# Patient Record
Sex: Female | Born: 1950 | Race: White | Hispanic: No | Marital: Single | Smoking: Former smoker
Health system: Southern US, Community
[De-identification: ages and names within clinical notes are randomized; demographics above are authoritative.]

## PROBLEM LIST (undated history)

## (undated) DIAGNOSIS — E785 Hyperlipidemia, unspecified: Secondary | ICD-10-CM

## (undated) DIAGNOSIS — Z5189 Encounter for other specified aftercare: Secondary | ICD-10-CM

## (undated) DIAGNOSIS — B372 Candidiasis of skin and nail: Secondary | ICD-10-CM

## (undated) DIAGNOSIS — IMO0002 Reserved for concepts with insufficient information to code with codable children: Secondary | ICD-10-CM

## (undated) DIAGNOSIS — F32A Depression, unspecified: Secondary | ICD-10-CM

## (undated) DIAGNOSIS — J189 Pneumonia, unspecified organism: Secondary | ICD-10-CM

## (undated) DIAGNOSIS — G4730 Sleep apnea, unspecified: Secondary | ICD-10-CM

## (undated) DIAGNOSIS — F329 Major depressive disorder, single episode, unspecified: Secondary | ICD-10-CM

## (undated) DIAGNOSIS — F319 Bipolar disorder, unspecified: Secondary | ICD-10-CM

## (undated) DIAGNOSIS — R0602 Shortness of breath: Secondary | ICD-10-CM

## (undated) DIAGNOSIS — H9190 Unspecified hearing loss, unspecified ear: Secondary | ICD-10-CM

## (undated) DIAGNOSIS — J40 Bronchitis, not specified as acute or chronic: Secondary | ICD-10-CM

## (undated) DIAGNOSIS — D649 Anemia, unspecified: Secondary | ICD-10-CM

## (undated) DIAGNOSIS — F419 Anxiety disorder, unspecified: Secondary | ICD-10-CM

## (undated) DIAGNOSIS — M1990 Unspecified osteoarthritis, unspecified site: Secondary | ICD-10-CM

## (undated) DIAGNOSIS — I10 Essential (primary) hypertension: Secondary | ICD-10-CM

## (undated) DIAGNOSIS — J449 Chronic obstructive pulmonary disease, unspecified: Secondary | ICD-10-CM

## (undated) DIAGNOSIS — K219 Gastro-esophageal reflux disease without esophagitis: Secondary | ICD-10-CM

## (undated) DIAGNOSIS — G629 Polyneuropathy, unspecified: Secondary | ICD-10-CM

## (undated) HISTORY — PX: OTHER SURGICAL HISTORY: SHX169

## (undated) HISTORY — PX: HIP FRACTURE SURGERY: SHX118

## (undated) HISTORY — PX: TONSILLECTOMY: SUR1361

---

## 2006-04-15 ENCOUNTER — Ambulatory Visit: Payer: Self-pay | Admitting: Internal Medicine

## 2006-04-28 ENCOUNTER — Ambulatory Visit: Payer: Self-pay | Admitting: Internal Medicine

## 2008-08-06 ENCOUNTER — Inpatient Hospital Stay (HOSPITAL_COMMUNITY): Admission: RE | Admit: 2008-08-06 | Discharge: 2008-08-13 | Payer: Self-pay | Admitting: Orthopedic Surgery

## 2008-08-06 ENCOUNTER — Encounter (INDEPENDENT_AMBULATORY_CARE_PROVIDER_SITE_OTHER): Payer: Self-pay | Admitting: Orthopedic Surgery

## 2008-10-04 ENCOUNTER — Inpatient Hospital Stay (HOSPITAL_COMMUNITY): Admission: EM | Admit: 2008-10-04 | Discharge: 2008-10-09 | Payer: Self-pay | Admitting: Emergency Medicine

## 2009-06-05 ENCOUNTER — Encounter: Admission: RE | Admit: 2009-06-05 | Discharge: 2009-06-05 | Payer: Self-pay | Admitting: Family Medicine

## 2009-07-10 ENCOUNTER — Inpatient Hospital Stay (HOSPITAL_COMMUNITY): Admission: EM | Admit: 2009-07-10 | Discharge: 2009-07-16 | Payer: Self-pay | Admitting: Emergency Medicine

## 2010-04-29 ENCOUNTER — Encounter
Admission: RE | Admit: 2010-04-29 | Discharge: 2010-05-14 | Payer: Self-pay | Source: Home / Self Care | Attending: Orthopedic Surgery | Admitting: Orthopedic Surgery

## 2010-05-19 ENCOUNTER — Encounter
Admission: RE | Admit: 2010-05-19 | Discharge: 2010-06-16 | Payer: Self-pay | Source: Home / Self Care | Attending: Orthopedic Surgery | Admitting: Orthopedic Surgery

## 2010-05-26 ENCOUNTER — Encounter: Admission: RE | Admit: 2010-05-26 | Payer: Self-pay | Source: Home / Self Care | Admitting: Orthopedic Surgery

## 2010-06-17 ENCOUNTER — Other Ambulatory Visit: Payer: Self-pay | Admitting: Family Medicine

## 2010-06-17 DIAGNOSIS — Z1239 Encounter for other screening for malignant neoplasm of breast: Secondary | ICD-10-CM

## 2010-06-23 ENCOUNTER — Ambulatory Visit: Payer: Self-pay

## 2010-06-30 ENCOUNTER — Ambulatory Visit
Admission: RE | Admit: 2010-06-30 | Discharge: 2010-06-30 | Disposition: A | Discharge status: Home / Self Care | Payer: Medicaid Other | Source: Ambulatory Visit | Attending: Family Medicine | Admitting: Family Medicine

## 2010-06-30 DIAGNOSIS — Z1239 Encounter for other screening for malignant neoplasm of breast: Secondary | ICD-10-CM

## 2010-08-05 LAB — BASIC METABOLIC PANEL
BUN: 18 mg/dL (ref 6–23)
BUN: 23 mg/dL (ref 6–23)
CO2: 28 mEq/L (ref 19–32)
CO2: 30 mEq/L (ref 19–32)
CO2: 31 mEq/L (ref 19–32)
Calcium: 7.6 mg/dL — ABNORMAL LOW (ref 8.4–10.5)
Calcium: 8.2 mg/dL — ABNORMAL LOW (ref 8.4–10.5)
Calcium: 8.4 mg/dL (ref 8.4–10.5)
Calcium: 8.5 mg/dL (ref 8.4–10.5)
Calcium: 9 mg/dL (ref 8.4–10.5)
Chloride: 103 mEq/L (ref 96–112)
Chloride: 104 mEq/L (ref 96–112)
Chloride: 106 mEq/L (ref 96–112)
Creatinine, Ser: 1.15 mg/dL (ref 0.4–1.2)
Creatinine, Ser: 1.19 mg/dL (ref 0.4–1.2)
Creatinine, Ser: 1.3 mg/dL — ABNORMAL HIGH (ref 0.4–1.2)
GFR calc Af Amer: 51 mL/min — ABNORMAL LOW (ref >60)
GFR calc Af Amer: 58 mL/min — ABNORMAL LOW (ref >60)
GFR calc Af Amer: 59 mL/min — ABNORMAL LOW (ref >60)
GFR calc non Af Amer: 43 mL/min — ABNORMAL LOW (ref >60)
GFR calc non Af Amer: 49 mL/min — ABNORMAL LOW (ref >60)
Glucose, Bld: 128 mg/dL — ABNORMAL HIGH (ref 70–99)
Glucose, Bld: 134 mg/dL — ABNORMAL HIGH (ref 70–99)
Potassium: 4.8 mEq/L (ref 3.5–5.1)
Potassium: 5.1 mEq/L (ref 3.5–5.1)
Sodium: 135 mEq/L (ref 135–145)
Sodium: 137 mEq/L (ref 135–145)

## 2010-08-05 LAB — CBC
Hemoglobin: 10.9 g/dL — ABNORMAL LOW (ref 12.0–15.0)
Hemoglobin: 11.9 g/dL — ABNORMAL LOW (ref 12.0–15.0)
Hemoglobin: 9.4 g/dL — ABNORMAL LOW (ref 12.0–15.0)
MCHC: 33.3 g/dL (ref 30.0–36.0)
MCHC: 33.4 g/dL (ref 30.0–36.0)
MCHC: 33.4 g/dL (ref 30.0–36.0)
MCHC: 33.9 g/dL (ref 30.0–36.0)
MCV: 85.4 fL (ref 78.0–100.0)
MCV: 85.7 fL (ref 78.0–100.0)
Platelets: 178 10*3/uL (ref 150–400)
RBC: 3.2 MIL/uL — ABNORMAL LOW (ref 3.87–5.11)
RBC: 3.65 MIL/uL — ABNORMAL LOW (ref 3.87–5.11)
RBC: 4.23 MIL/uL (ref 3.87–5.11)
RDW: 15.6 % — ABNORMAL HIGH (ref 11.5–15.5)
RDW: 15.9 % — ABNORMAL HIGH (ref 11.5–15.5)
RDW: 16.1 % — ABNORMAL HIGH (ref 11.5–15.5)
WBC: 10.7 10*3/uL — ABNORMAL HIGH (ref 4.0–10.5)
WBC: 8.8 10*3/uL (ref 4.0–10.5)

## 2010-08-05 LAB — CROSSMATCH
ABO/RH(D): A NEG
Antibody Screen: NEGATIVE

## 2010-08-05 LAB — PROTIME-INR
INR: 0.99 (ref 0.00–1.49)
Prothrombin Time: 13 seconds (ref 11.6–15.2)

## 2010-08-05 LAB — GLUCOSE, CAPILLARY
Glucose-Capillary: 102 mg/dL — ABNORMAL HIGH (ref 70–99)
Glucose-Capillary: 106 mg/dL — ABNORMAL HIGH (ref 70–99)
Glucose-Capillary: 123 mg/dL — ABNORMAL HIGH (ref 70–99)
Glucose-Capillary: 125 mg/dL — ABNORMAL HIGH (ref 70–99)
Glucose-Capillary: 133 mg/dL — ABNORMAL HIGH (ref 70–99)
Glucose-Capillary: 143 mg/dL — ABNORMAL HIGH (ref 70–99)
Glucose-Capillary: 93 mg/dL (ref 70–99)
Glucose-Capillary: 99 mg/dL (ref 70–99)

## 2010-08-05 LAB — HEMOGLOBIN A1C: Mean Plasma Glucose: 137 mg/dL

## 2010-08-05 LAB — URINALYSIS, ROUTINE W REFLEX MICROSCOPIC
Bilirubin Urine: NEGATIVE
Glucose, UA: NEGATIVE mg/dL
Hgb urine dipstick: NEGATIVE
Ketones, ur: NEGATIVE mg/dL
Nitrite: NEGATIVE
Protein, ur: NEGATIVE mg/dL

## 2010-08-05 LAB — HEMOGLOBIN AND HEMATOCRIT, BLOOD: Hemoglobin: 9.9 g/dL — ABNORMAL LOW (ref 12.0–15.0)

## 2010-08-05 LAB — DIFFERENTIAL
Basophils Absolute: 0 10*3/uL (ref 0.0–0.1)
Monocytes Absolute: 0.8 10*3/uL (ref 0.1–1.0)
Monocytes Relative: 8 % (ref 3–12)
Neutro Abs: 8.2 10*3/uL — ABNORMAL HIGH (ref 1.7–7.7)

## 2010-08-10 LAB — BASIC METABOLIC PANEL
Calcium: 8.7 mg/dL (ref 8.4–10.5)
Chloride: 99 mEq/L (ref 96–112)
Creatinine, Ser: 1.23 mg/dL — ABNORMAL HIGH (ref 0.4–1.2)
GFR calc Af Amer: 54 mL/min — ABNORMAL LOW (ref >60)
GFR calc non Af Amer: 45 mL/min — ABNORMAL LOW (ref >60)

## 2010-08-10 LAB — GLUCOSE, CAPILLARY
Glucose-Capillary: 107 mg/dL — ABNORMAL HIGH (ref 70–99)
Glucose-Capillary: 111 mg/dL — ABNORMAL HIGH (ref 70–99)
Glucose-Capillary: 123 mg/dL — ABNORMAL HIGH (ref 70–99)

## 2010-08-25 LAB — HEMOGLOBIN AND HEMATOCRIT, BLOOD
HCT: 23.3 % — ABNORMAL LOW (ref 36.0–46.0)
HCT: 24.9 % — ABNORMAL LOW (ref 36.0–46.0)
Hemoglobin: 7.8 g/dL — CL (ref 12.0–15.0)
Hemoglobin: 8.1 g/dL — ABNORMAL LOW (ref 12.0–15.0)

## 2010-08-25 LAB — ANAEROBIC CULTURE

## 2010-08-25 LAB — BASIC METABOLIC PANEL
CO2: 27 mEq/L (ref 19–32)
Calcium: 9.3 mg/dL (ref 8.4–10.5)
Creatinine, Ser: 1.11 mg/dL (ref 0.4–1.2)
GFR calc Af Amer: >60 mL/min (ref >60)
GFR calc non Af Amer: 50 mL/min — ABNORMAL LOW (ref >60)
Sodium: 138 mEq/L (ref 135–145)

## 2010-08-25 LAB — GLUCOSE, CAPILLARY
Glucose-Capillary: 103 mg/dL — ABNORMAL HIGH (ref 70–99)
Glucose-Capillary: 106 mg/dL — ABNORMAL HIGH (ref 70–99)
Glucose-Capillary: 121 mg/dL — ABNORMAL HIGH (ref 70–99)

## 2010-08-25 LAB — TYPE AND SCREEN: ABO/RH(D): A NEG

## 2010-08-25 LAB — COMPREHENSIVE METABOLIC PANEL
ALT: 17 U/L (ref 0–35)
AST: 18 U/L (ref 0–37)
Alkaline Phosphatase: 70 U/L (ref 39–117)
CO2: 27 mEq/L (ref 19–32)
Chloride: 103 mEq/L (ref 96–112)
GFR calc Af Amer: 59 mL/min — ABNORMAL LOW (ref >60)
GFR calc non Af Amer: 49 mL/min — ABNORMAL LOW (ref >60)
Glucose, Bld: 130 mg/dL — ABNORMAL HIGH (ref 70–99)
Sodium: 137 mEq/L (ref 135–145)
Total Bilirubin: 0.9 mg/dL (ref 0.3–1.2)

## 2010-08-25 LAB — CBC
Hemoglobin: 11.7 g/dL — ABNORMAL LOW (ref 12.0–15.0)
RBC: 4.28 MIL/uL (ref 3.87–5.11)
WBC: 13.1 10*3/uL — ABNORMAL HIGH (ref 4.0–10.5)

## 2010-08-25 LAB — URINALYSIS, ROUTINE W REFLEX MICROSCOPIC
Glucose, UA: NEGATIVE mg/dL
Hgb urine dipstick: NEGATIVE
Protein, ur: NEGATIVE mg/dL
Urobilinogen, UA: 0.2 mg/dL (ref 0.0–1.0)

## 2010-08-25 LAB — WOUND CULTURE: Gram Stain: NONE SEEN

## 2010-08-25 LAB — PROTIME-INR
INR: 1 (ref 0.00–1.49)
INR: 1.2 (ref 0.00–1.49)
INR: 2.3 — ABNORMAL HIGH (ref 0.00–1.49)
INR: 2.3 — ABNORMAL HIGH (ref 0.00–1.49)
Prothrombin Time: 15.1 seconds (ref 11.6–15.2)
Prothrombin Time: 18.1 seconds — ABNORMAL HIGH (ref 11.6–15.2)

## 2010-08-25 LAB — URINE MICROSCOPIC-ADD ON

## 2010-08-25 LAB — DIFFERENTIAL
Lymphocytes Relative: 10 % — ABNORMAL LOW (ref 12–46)
Monocytes Absolute: 0.6 10*3/uL (ref 0.1–1.0)
Monocytes Relative: 5 % (ref 3–12)
Neutro Abs: 11 10*3/uL — ABNORMAL HIGH (ref 1.7–7.7)
Neutrophils Relative %: 84 % — ABNORMAL HIGH (ref 43–77)

## 2010-08-25 LAB — APTT: aPTT: 29 seconds (ref 24–37)

## 2010-08-27 LAB — BASIC METABOLIC PANEL
BUN: 10 mg/dL (ref 6–23)
BUN: 14 mg/dL (ref 6–23)
BUN: 8 mg/dL (ref 6–23)
BUN: 9 mg/dL (ref 6–23)
CO2: 29 mEq/L (ref 19–32)
CO2: 29 mEq/L (ref 19–32)
CO2: 29 mEq/L (ref 19–32)
CO2: 29 mEq/L (ref 19–32)
Calcium: 8.6 mg/dL (ref 8.4–10.5)
Calcium: 8.8 mg/dL (ref 8.4–10.5)
Calcium: 9.5 mg/dL (ref 8.4–10.5)
Chloride: 101 mEq/L (ref 96–112)
Chloride: 102 mEq/L (ref 96–112)
Chloride: 107 mEq/L (ref 96–112)
Creatinine, Ser: 0.9 mg/dL (ref 0.4–1.2)
Creatinine, Ser: 0.96 mg/dL (ref 0.4–1.2)
Creatinine, Ser: 0.98 mg/dL (ref 0.4–1.2)
Creatinine, Ser: 1.03 mg/dL (ref 0.4–1.2)
Creatinine, Ser: 1.05 mg/dL (ref 0.4–1.2)
GFR calc Af Amer: >60 mL/min (ref >60)
GFR calc Af Amer: >60 mL/min (ref >60)
GFR calc non Af Amer: 58 mL/min — ABNORMAL LOW (ref >60)
GFR calc non Af Amer: >60 mL/min (ref >60)
GFR calc non Af Amer: >60 mL/min (ref >60)
Glucose, Bld: 124 mg/dL — ABNORMAL HIGH (ref 70–99)
Glucose, Bld: 127 mg/dL — ABNORMAL HIGH (ref 70–99)
Potassium: 4 mEq/L (ref 3.5–5.1)
Potassium: 4.3 mEq/L (ref 3.5–5.1)
Potassium: 4.3 mEq/L (ref 3.5–5.1)
Potassium: 4.4 mEq/L (ref 3.5–5.1)
Sodium: 132 mEq/L — ABNORMAL LOW (ref 135–145)
Sodium: 134 mEq/L — ABNORMAL LOW (ref 135–145)

## 2010-08-27 LAB — TYPE AND SCREEN
ABO/RH(D): A NEG
Antibody Screen: NEGATIVE

## 2010-08-27 LAB — GLUCOSE, CAPILLARY
Glucose-Capillary: 100 mg/dL — ABNORMAL HIGH (ref 70–99)
Glucose-Capillary: 108 mg/dL — ABNORMAL HIGH (ref 70–99)
Glucose-Capillary: 108 mg/dL — ABNORMAL HIGH (ref 70–99)
Glucose-Capillary: 110 mg/dL — ABNORMAL HIGH (ref 70–99)
Glucose-Capillary: 113 mg/dL — ABNORMAL HIGH (ref 70–99)
Glucose-Capillary: 116 mg/dL — ABNORMAL HIGH (ref 70–99)
Glucose-Capillary: 119 mg/dL — ABNORMAL HIGH (ref 70–99)
Glucose-Capillary: 120 mg/dL — ABNORMAL HIGH (ref 70–99)
Glucose-Capillary: 120 mg/dL — ABNORMAL HIGH (ref 70–99)
Glucose-Capillary: 121 mg/dL — ABNORMAL HIGH (ref 70–99)
Glucose-Capillary: 121 mg/dL — ABNORMAL HIGH (ref 70–99)
Glucose-Capillary: 121 mg/dL — ABNORMAL HIGH (ref 70–99)
Glucose-Capillary: 122 mg/dL — ABNORMAL HIGH (ref 70–99)
Glucose-Capillary: 92 mg/dL (ref 70–99)
Glucose-Capillary: 97 mg/dL (ref 70–99)
Glucose-Capillary: 97 mg/dL (ref 70–99)

## 2010-08-27 LAB — COMPREHENSIVE METABOLIC PANEL
AST: 30 U/L (ref 0–37)
Albumin: 3.7 g/dL (ref 3.5–5.2)
BUN: 17 mg/dL (ref 6–23)
Calcium: 9.8 mg/dL (ref 8.4–10.5)
Creatinine, Ser: 1.19 mg/dL (ref 0.4–1.2)
GFR calc Af Amer: 56 mL/min — ABNORMAL LOW (ref >60)
Total Protein: 7 g/dL (ref 6.0–8.3)

## 2010-08-27 LAB — URINALYSIS, ROUTINE W REFLEX MICROSCOPIC
Bilirubin Urine: NEGATIVE
Ketones, ur: NEGATIVE mg/dL
Nitrite: NEGATIVE
Specific Gravity, Urine: 1.009 (ref 1.005–1.030)
Urobilinogen, UA: 0.2 mg/dL (ref 0.0–1.0)
pH: 6 (ref 5.0–8.0)

## 2010-08-27 LAB — PROTIME-INR
INR: 1.4 (ref 0.00–1.49)
INR: 2.4 — ABNORMAL HIGH (ref 0.00–1.49)
INR: 2.8 — ABNORMAL HIGH (ref 0.00–1.49)
Prothrombin Time: 18.1 seconds — ABNORMAL HIGH (ref 11.6–15.2)
Prothrombin Time: 27.5 seconds — ABNORMAL HIGH (ref 11.6–15.2)
Prothrombin Time: 27.8 seconds — ABNORMAL HIGH (ref 11.6–15.2)

## 2010-08-27 LAB — CBC
HCT: 38.5 % (ref 36.0–46.0)
MCV: 86.7 fL (ref 78.0–100.0)
Platelets: 296 10*3/uL (ref 150–400)
RDW: 13.5 % (ref 11.5–15.5)

## 2010-08-27 LAB — DIFFERENTIAL
Basophils Absolute: 0 10*3/uL (ref 0.0–0.1)
Lymphocytes Relative: 20 % (ref 12–46)
Lymphs Abs: 1.7 10*3/uL (ref 0.7–4.0)
Monocytes Absolute: 0.6 10*3/uL (ref 0.1–1.0)
Monocytes Relative: 7 % (ref 3–12)
Neutro Abs: 6.1 10*3/uL (ref 1.7–7.7)

## 2010-08-27 LAB — APTT: aPTT: 31 seconds (ref 24–37)

## 2010-08-27 LAB — HEMOGLOBIN AND HEMATOCRIT, BLOOD
HCT: 27.9 % — ABNORMAL LOW (ref 36.0–46.0)
Hemoglobin: 10.5 g/dL — ABNORMAL LOW (ref 12.0–15.0)
Hemoglobin: 9.2 g/dL — ABNORMAL LOW (ref 12.0–15.0)

## 2010-09-29 NOTE — Op Note (Signed)
NAME:  [name] [name], [name] [name]                 ACCOUNT NO.:  [id number]   MEDICAL RECORD NO.:  [id number]          PATIENT TYPE:  INP   LOCATION:  1606                         FACILITY:  [facility]   PHYSICIAN:  Marlowe Kays, M.D.  DATE OF BIRTH:  04/25/1951   DATE OF PROCEDURE:  10/05/2008  DATE OF DISCHARGE:                               OPERATIVE REPORT   PREOPERATIVE DIAGNOSES:  Closed displaced right femur fracture through  last screw hole of three hole hip compression screw.   POSTOPERATIVE DIAGNOSES:  Closed displaced right femur fracture through  last screw hole of three hole hip compression screw.   OPERATION:  1. Removal of prior three hole sideplate from hip compression screw      with retention of the lag screw.  2. Open reduction of the femur fracture.  3. Internal fixation of the fracture with a new 8-hole sideplate.  4. Application of cancellus allograft.  5. Application of a cylinder cast.   SURGEON:  Dr. Simonne Come.   ASSISTANT:  Dr. Worthy Rancher.   ANESTHESIA:  General.   PATHOLOGY AND JUSTIFICATION FOR PROCEDURE:  She had, had a prior hip  compression screw with three hole plate on August 06, 2008, by Dr.  Darrelyn Hillock.  Yesterday evening, she was simply walking on a step she said  when she felt a pop and her leg gave way with pain.  She was seen in the  [facility] emergency room with the above-mentioned fracture noted.  She  is admitted at this time for operative correction.   PROCEDURE:  Prophylactic antibiotics, satisfactory general anesthesia,  placed on the [facility] fracture table and with traction and rotation, we  obtained the best reduction of the fracture that we could before  opening.  The fracture had occurred at roughly the level of the third  screw hole with the screw out of the bone, the other two screws  remaining in the bone with the sideplate.  I then prepped the right leg  with DuraPrep down below the knee and draped in a sterile field with  shower curtain  employed.  Timeout performed.  I made a long lateral  incision going through the prior incision and then curving slightly  anteriorly and significantly distally.  I went through the old surgical  incision and found a pocket of fluid which appeared to be postoperative  collection of fluid, but we cultured it anyway for aerobic and  anaerobic.  The fracture site was identified and we then exposed enough  femur distally to allow five more additional holes on the plate or 10  cortices.  I then was able to remove the three screws and the sideplate  leaving the lag screw in place since it was in good position.  I  debrided the fracture site freshening it up with curette and a small  rongeur and using a Jackson clamp and using the C-arm with traction and  rotation, we were able to anatomically reduce the fracture.  Once this  had occurred, we realized what we had to do to maintain it and we then  removed  the Jackson clamp and I was able to thread the 8-hole 135 degree  angle sideplate over the previous lag screw.  Previous compression plate  had been 145 degrees, but there was a little gap between the sideplate  in the femur on the intraoperative pictures and we felt that to decrease  the angle might give more stability on the femur.  We then went through  a somewhat difficult time trying to keep the fracture reduced and also  anatomically placed the plate on the distal femur.  The whole [place] was  complicated by her significant obesity.  We finally were able to achieve  both with reduction of the fracture and correct placement of the distal  plate using two [place] claw clamps.  We then went about individually  drilling, measuring and screwing the eight holes, except for the one at  the fracture site.  Throughout the [place], progress was monitored with the  C-arm.  At the conclusion of the internal fixation and stabilization, we  took AP and lateral pictures and then irrigated the wound well.   There  was a slight gap in the fracture fragments and we packed this with  cancellous allograft.  We then closed the wound with a running #1 Vicryl  in the vastus lateralis, interrupted #1 Vicryl in the fascia lata and  remnants thereof from the prior surgery.  The subcutaneous tissue was  closed in layers with #1-0 and 0 Vicryl and staples in the skin.  Betadine adaptic dry sterile dressings were applied.  The knee  immobilizer was then applied.  FloSeal was also used of prior to  closure.  Estimated blood loss was mainly at the first part of the [place],  about 700 mL with no blood replacement.  There were no known operative  complications.  She was taken to the PACU in satisfactory condition.           ______________________________  Marlowe Kays, M.D.     JA/MEDQ  D:  10/05/2008  T:  10/05/2008  Job:  [id number]

## 2010-09-29 NOTE — Discharge Summary (Signed)
NAME:  [name] [name], [name] [name]                 ACCOUNT NO.:  [id number]   MEDICAL RECORD NO.:  [id number]          PATIENT TYPE:  INP   LOCATION:  1606                         FACILITY:  [facility]   PHYSICIAN:  Marlowe Kays, M.D.  DATE OF BIRTH:  01/04/1951   DATE OF ADMISSION:  10/04/2008  DATE OF DISCHARGE:  10/09/2008                               DISCHARGE SUMMARY   ADMITTING DIAGNOSES:  1. Periprosthetic fracture of the right femur (compression screw and      side plate).  2. Hypertension.  3. Chronic obstructive pulmonary disease.  4. Diabetes, type 2.  5. Obesity.   DISCHARGE DIAGNOSES:  1. Periprosthetic fracture of the right femur (compression screw and      side plate).  2. Hypertension.  3. Chronic obstructive pulmonary disease.  4. Diabetes, type 2.  5. Obesity.  6. Postop acute anemia.   OPERATION:  1. On Oct 05, 2008, the patient underwent removal of prior 3-hole side      plate from hip compression screw with retention of lag screw.  2. Open reduction of femoral fracture.  3. Internal fixation of the fracture with new 8-hole side plate.  4. Application of cancellous allograft.   Dr. Ranee Gosselin assisted.   BRIEF HISTORY:  This 60 year old female who underwent open reduction and  internal fixation of the suspected pathological fracture of the right  femur by Dr. Darrelyn Hillock on August 06, 2008, was living at her sister's house  after being in an inpatient rehabilitation and ascending some stairs.  When she stepped on the first step, she felt/heard a pop in her right  femur, immediate pain, and fell backwards.  She struck her head when she  fell backwards.  No lacerations and the x-rays of the skull were normal.  X-rays did show a periprosthetic fracture of the right femur at the  distal area of the side plate of the compression screw fixation.  The  patient was scheduled for open reduction internal fixation for the above  procedure.  The operating room became available the  next day and she  underwent that procedure.   COURSE IN THE HOSPITAL:  She tolerated her surgical procedure quite  well.  She did have a drop in her hemoglobin postoperatively which was  expected somewhat.  It dropped down to 7.8.  We chose not to transfuse  he due to her relatively good health and the hemoglobin came up to 8.1  at the time of this dictation.  The wound had a large amount of serous  drainage.  Dressings were changed in a timely manner.   The patient was placed on Coumadin protocol followed by pharmacy to keep  the INR between 2 and 3.  She did well with that.  Physical therapy  worked with the patient, primarily transfers.  We allowed only touchdown  weightbearing to the right lower extremity using a knee immobilizer to  prevent rotation of the thigh and the fracture site.  When in bed, knee  immobilizer was not necessary.  She primarily used a bedside commode,  was voiding with no difficulty.  Intravenous antibiotics, as well as a  PCA were eventually discontinued and she was maintained on p.o.  analgesics.   She will need to have skilled nursing for her continuing rehabilitation  until she becomes more independent and hopefully be able to return to  her sister's home for the rest of her healing course.   LABORATORY VALUES IN THE HOSPITAL:  Hematologically showed a  preoperative hemoglobin of 11.7, hematocrit was 35.7.  Final hemoglobin  was 8.1 with hematocrit of 23.9.  Blood chemistries remained normal.  Wound culture showed no organism seen at the fracture site.  This was no  growth x2 days.  Anaerobic showed no anaerobes in culture and progress  for 5 days after specimen was given on Oct 05, 2008.  When it was read  on Oct 06, 2008, again no growth was seen.  Chest x-ray read of the  cervical spine series and CT imaging showed no fracture but there was  small subluxation of C3 and C4 due to degenerative facet changes.  Portable chest showed probable COPD and  emphysema but no acute findings.  Urinalysis was negative for protein and nitrites.  Vital signs were  stable at discharge.   CONDITION ON DISCHARGE:  Improved, stable.   PLAN:  The patient is to continue with her rehabilitation of the right  lower extremity with activities of daily living, ambulation, maintaining  touchdown weightbearing to the right lower extremity only, no rotation  of the thigh/femur, and will use knee immobilizer when up and about.  Knee immobilizer need not be used while in bed.  Dressing changes on an  as-needed basis.  As long as serous drainage is present, it should be  done daily.   She should stay on the Coumadin protocol to maintain her INR between 2  and 3 for 4 weeks after date of surgery.  Staples may be removed at 2 to  2-1/2 weeks after surgery and Steri-Strips applied as indicated.  If  drainage continues, then she needs to keep the staples in until seen by  [place] in our office.  We would like to see her back in our office at  [facility], [phone], 2 to 2-1/2 weeks after date of  surgery.  At that time, we will x-ray her hip and femur.   MEDICATIONS AT DISCHARGE.:  1. Metformin 500 mg daily.  2. Lisinopril 10 mg daily.  3. Bupropion 200 mg b.i.d.  4. Omeprazole 20 mg daily.  5. Meloxicam 15 mg will not be used until after the Coumadin protocol      is finished.  6. Multivitamins daily.  7. Ocuvite b.i.d.  8. Gas-X gel caps p.r.n. none.  9. We will use Percocet for discomfort.  She may have that or Vicodin      for her needs.  10.Robaxin 500 mg one p.o. every 6 hours is used for muscle spasms.   If you have any medical questions, you would need to contact the medical  attending for the nursing facility to which she is being discharged and  any orthopedic problems or questions call us at [phone].      Dooley L. Cherlynn June.    ______________________________  Marlowe Kays, M.D.    DLU/MEDQ  D:  10/09/2008  T:   10/09/2008  Job:  [id number]   cc:   Marlowe Kays, M.D.  Fax: [phone]

## 2010-09-29 NOTE — Op Note (Signed)
NAME:  [name], [name]                 ACCOUNT NO.:  [id number]   MEDICAL RECORD NO.:  [id number]          PATIENT TYPE:  INP   LOCATION:  0009                         FACILITY:  [facility]   PHYSICIAN:  Georges Lynch. Gioffre, M.D.DATE OF BIRTH:  March 07, 1951   DATE OF PROCEDURE:  08/06/2008  DATE OF DISCHARGE:                               OPERATIVE REPORT   SURGEON:  Dr.  Darrelyn Hillock.   ASSISTANT:  Nurse and Zara Chess, PA student.   PREOPERATIVE DIAGNOSIS:  Questionable pathologic intertrochanteric  fracture right hip.   POSTOPERATIVE DIAGNOSIS:  Questionable pathologic intertrochanteric  fracture right hip.   OPERATION:  Open reduction and internal fixation of an intertrochanteric  fracture of the right hip utilizing the TK2 hip compression screw plate  device.  I used a 3-hole plate; the angle was a 145-degree-angled plate  with 16-XW-RUEAVW compression hip screw.   PROCEDURE IN DETAIL:  Under general anesthesia with the patient on the  fracture table a routine orthopedic prep and draping of the right hip  was carried out.  She had 2 g of IV Ancef preop.  At this time the C-arm  was brought in and an incision was made over the lateral aspect of the  right hip.  Bleeders were identified and cauterized.  Note, she was  quite obese and this took a great deal of time to expose the underlying  muscle and fascia.  Great care was taken.  We took our time going down  through and cauterized all the bleeders.  We finally went down and  incised the iliotibial band and then incised the vastus lateralis and  cauterized the bleeders.  Great care was taken not to injure the  underlying sciatic nerve which was posterior.  We then inserted a  Bennett retractor and identified the left greater trochanter and brought  the C-arm in and made a drill hole measuring 9/64/inch in the lateral  femoral cortex.  Then I utilized the angle guide to appropriate angle  and I inserted a guidepin up through the femoral  neck into the femoral  head.  Both on AP and lateral we had excellent position.  I then  measured the screw to be approximately 85-90 mm in length.  I then  drilled the lateral femoral cortex up into the femoral neck and head.  I  then tapped the femoral neck and head with a tap and then inserted my  plate compression screw.  Note, we had a slight elevation of the distal  part of the plate from the femoral shaft and basically this was a stress-  type fracture.  I had three good purchase screws after the drill holes  were made into the lateral femoral cortex and we measured it to length  and inserted 3 screws for excellent fixation of the bone was extremely  hard in that area, so we had good fixation of the plate device.  We  thoroughly irrigated out the area and we noted that under C-arm we had  excellent position of the compression screw into the femoral head.  I  then inserted  10 mL of FloSeal followed by some thrombin-soaked Gelfoam  and closed the wound layers in usual fashion.  Sterile Neosporin  dressing was applied.  The patient left the operative room in  satisfactory condition.           ______________________________  Georges Lynch Darrelyn Hillock, M.D.     RAG/MEDQ  D:  08/06/2008  T:  08/06/2008  Job:  [id number]

## 2010-09-29 NOTE — Consult Note (Signed)
NAME:  [name] [name], [name] [name]                 ACCOUNT NO.:  [id number]   MEDICAL RECORD NO.:  [id number]          PATIENT TYPE:  INP   LOCATION:  0009                         FACILITY:  [facility]   PHYSICIAN:  Georges Lynch. Gioffre, M.D.DATE OF BIRTH:  December 09, 1950   DATE OF CONSULTATION:  DATE OF DISCHARGE:                                 CONSULTATION   She was seen by me in the office with a history that 3 weeks ago she  fell in the shower and injured her right hip.  She had plain x-rays that  showed no fracture.  I still was very suspicious that she may have had a  fracture of the hip so I sent her over for an MRI.  The MRI revealed a  stress type fracture through the femoral neck and through the  intertrochanteric area on the right.  For that reason I admitted her to  the hospital for a hip nailing.   PAST SURGICAL HISTORY:  Tonsillectomy.   MEDICAL HISTORY:  History of reflux, diabetes, hypertension.   FAMILY HISTORY:  Unremarkable.   MEDICATIONS:  Metformin, lisinopril, meloxicam, bupropion hydrochloride,  omeprazole, aspirin, vitamins, Tramadol.  Note:  I do not have the  doses.  She did not bring those with her to the office.   ALLERGIES:  PREDNISONE CAUSES CHEST TIGHTNESS.   PHYSICAL EXAM:  VITALS:  Her pulse was 80, blood pressure 140/80,  respirations were normal.  She was alert and oriented.  The exam of the  head and neck was negative.  She had no lesions in the mouth.  The neck  was normal.  LUNGS:  Clear.  HEART:  Normal sinus rhythm with no murmur.  UPPER EXTREMITIES:  Were normal.  ABDOMEN:  She was severely obese.  BREAST EXAM:  Was deferred.  RECTAL:  Exam deferred.  LEFT HIP:  Normal.  RIGHT HIP:  She had painful motion of the right hip.  Her calves are  soft, nontender, no phlebitis.  The circulation was intact.  NEUROLOGICALLY:  She was intact.   Plain x-rays of the right hip were negative.  An MRI of the right hip  revealed a stress fracture through the right hip.   IMPRESSION:  Femoral neck and intertrochanteric fracture right hip.   PLAN:  I admitted her to the hospital and scheduled her for surgery for  open reduction internal fixation of the intertrochanteric fracture right  hip.           ______________________________  Georges Lynch. Darrelyn Hillock, M.D.     RAG/MEDQ  D:  08/06/2008  T:  08/06/2008  Job:  [id number]   cc:   Harrel Lemon. Merla Riches, M.D.  Fax: [phone]

## 2010-09-29 NOTE — H&P (Signed)
NAME:  [name], [name]                 ACCOUNT NO.:  [id number]   MEDICAL RECORD NO.:  [id number]          PATIENT TYPE:  INP   LOCATION:  0114                         FACILITY:  [facility]   PHYSICIAN:  Marlowe Kays, M.D.  DATE OF BIRTH:  1951-04-30   DATE OF ADMISSION:  10/04/2008  DATE OF DISCHARGE:                              HISTORY & PHYSICAL   CHIEF COMPLAINT:  Pain in my right thigh.   PRESENT ILLNESS:  A 60 year old white female who is recovering from open  reduction and internal fixation of suspected pathologic fracture of the  right femur which was done by Dr. Darrelyn Hillock on 08/06/2008.  She was living  his sister's house after coming out of rehab for continuing home rehab.  She was ascending some stairs and when stepping on the first step, she  felt/heard a pop into her right femur.  She had immediate pain in that  area and fell backwards onto some concrete.  She struck her head when  she fell backwards, but did not suffer any lacerations.  She was brought  to the emergency room where x-rays showed a periprosthetic fracture of  the right femur in the distal area of the side plate of compression  screw fixation which was performed as mentioned above.  She, after much  discussion, as well as after Dr. Simonne Come explaining the nature of the  fracture, it was decided that she would need to undergo open reduction  and internal fixation with removal of the side plate and application of  a longer side plate over the fracture site and perhaps bone graft.   PAST MEDICAL HISTORY:  For all details, please see the admission chart  of March of 2010.  This patient has hypertension, COPD, and diabetes  type 2.   CURRENT MEDICATIONS:  Metformin 5 mg daily, lisinopril 10 mg daily,  bupropion 100 mg b.i.d., omeprazole 20 mg daily, meloxicam 15 mg daily,  multiple vitamins, Tylenol, and aspirin 81 mg daily.   PAST SURGERIES:  Primarily the open reduction and external fixation.  She also had a  tonsillectomy in the past.   REVIEW OF SYSTEMS:  CNS: No seizures, paralysis, numbness, double  vision.  RESPIRATORY:  No productive cough, no hemoptysis or shortness of breath  other than with extreme activity but she is morbidly obese.  CARDIOVASCULAR:  No chest pain, no angina or orthopnea.  GASTROINTESTINAL: No nausea, vomiting, melena or bloody stool.  GENITOURINARY:  No discharge, dysuria or hematuria.  MUSCULOSKELETAL:  Primarily in the present illness.   PHYSICAL EXAMINATION:  Alert and cooperative, fully oriented 60 year old  white female who is accompanied by relatives.  She is seen lying on the  emergency room stretcher.  She is morbidly obese.  VITAL SIGNS:  Temperature 97.9, pulse 81, respirations 26, blood  pressure 151/73.  HEENT: Normocephalic.  PERRLA.  NECK:  Supple.  CHEST:  Clear to auscultation with the patient supine.  HEART:  Regular rate and rhythm, however, the heart sounds are quite  distant.  ABDOMEN:  Obese, soft, liver and spleen not felt.  GENITALIA/RECTAL/PELVIC/BREASTS:  Not done  and not pertinent to present  illness.  EXTREMITIES:  The right lower extremity is seen with increased girth to  the thigh compared to the left one.  She can dorsiflex the foot.  Sensory is intact and pulses are 2+ in the dorsalis pedis.   ADMISSION DIAGNOSIS:  1. Periprosthetic fracture of the right femur.  2. Hypertension.  3. Chronic obstructive pulmonary disease.  4. Diabetes type 2.   PLAN:  The patient will undergo open reduction and internal fixation of  the right femoral fracture tomorrow when operating room schedule  permits.      Dooley L. Cherlynn June.    ______________________________  Marlowe Kays, M.D.    DLU/MEDQ  D:  10/04/2008  T:  10/04/2008  Job:  [id number]

## 2010-09-29 NOTE — Discharge Summary (Signed)
NAME:  [name] [name], [name] [name]                 ACCOUNT NO.:  [id number]   MEDICAL RECORD NO.:  [id number]          PATIENT TYPE:  INP   LOCATION:  1607                         FACILITY:  [facility]   PHYSICIAN:  Georges Lynch. Gioffre, M.D.DATE OF BIRTH:  27-Jan-1951   DATE OF ADMISSION:  08/06/2008  DATE OF DISCHARGE:                               DISCHARGE SUMMARY   She was admitted to the hospital through the office and taken to surgery  on August 06, 2008 with an intertrochanteric fracture of the right hip.  She had severe pain in her hip.  We had plain films followed by an MRI  that showed the intertrochanteric femoral neck type fracture.  We did an  open reduction internal fixation utilizing a TK2 compression hip screw  plate device.   At this time, she did well postop.  The estimated blood loss of surgery  300 mL.  We had her on heparin and Coumadin protocol.  We had her  evaluated immediately for skilled nursing facility since she did live  alone at home.  The hemoglobin remained stable.  On August 07, 2008, her  hemoglobin was 10.5.  She was up ambulating with toe-touch initially  followed by partial weightbearing.  Her hemoglobin on August 08, 2008 was  9.5.   The patient was followed on a daily basis.  Her ambulation improved.  Wound looked good.  She had some serous drainage from her wound.  Finally on August 11, 2008, her INR was 2.4 with minimal serous drainage  with no real problems with the wound.  I saw her again on August 12, 2008, dictated her summary here today for her to be released to the  nursing facility.   LABORATORY DATA:  The pertinent laboratory findings is her PTT was 31.  Her sodium 145, potassium 5.1, chloride 103, glucose was 131, BUN 17,  creatinine 1.19.  Liver function test was fine.  They were normal.  Her  white count was 8.6, hemoglobin was 12.6, hematocrit 38.5.  The  urinalysis was normal.  She had multiple other studies.  Her EKG was  basically normal with an  incomplete right bundle branch block, normal  sinus rhythm.  X-rays looked fine as far as the hip was concerned.  Pathology report just showed fracture fragments.   DISCHARGE DIAGNOSES:  The final discharge diagnosis was  intertrochanteric femoral neck type fracture of her right hip.   DISCHARGE CONDITION:  Improved.   DISCHARGE MEDICATIONS:  1. Metformin 500 mg by mouth every morning.  2. Lisinopril 10 mg by mouth q.a.m.  3. Bupropion 100 mg in the morning, 100 mg at night.  4. Omeprazole 20 mg in the morning.  5. She will continue her vitamins that she was taken at home.  6. For anticoagulation, she will be on Coumadin.  Keep her on the      Coumadin at 2.5 mg a day.  7. Colace 100 mg b.i.d.  8. She should have albuterol inhaler as needed p.r.n. 1-2 puffs q.i.d.   DISCHARGE INSTRUCTIONS:  1. She will ambulate with her walker 50%  weightbearing on the right.  2. Her dressing should be changed daily.  3. She should see Dr. Darrelyn Hillock in about 2 weeks from the day of surgery      in the office for suture removal and x-ray.  4. She should have weekly INRs to manage her Coumadin.  5. She will be on Coumadin for a total of 4 weeks from the day of      surgery followed by aspirin.  6. She is to have CBGs at least three times a day to manage her blood      sugar.  She does not need any insulin coverage at night when she      goes to bed.  7. Call Dr. Darrelyn Hillock with any issues at [phone].           ______________________________  Georges Lynch. Darrelyn Hillock, M.D.     RAG/MEDQ  D:  08/12/2008  T:  08/12/2008  Job:  [id number]

## 2011-09-15 ENCOUNTER — Other Ambulatory Visit: Payer: Self-pay | Admitting: Family Medicine

## 2011-09-22 ENCOUNTER — Other Ambulatory Visit: Payer: Medicaid Other

## 2011-09-30 ENCOUNTER — Other Ambulatory Visit: Payer: Self-pay | Admitting: Family Medicine

## 2011-09-30 ENCOUNTER — Ambulatory Visit
Admission: RE | Admit: 2011-09-30 | Discharge: 2011-09-30 | Disposition: A | Discharge status: Home / Self Care | Payer: Medicare Other | Source: Ambulatory Visit | Attending: Family Medicine | Admitting: Family Medicine

## 2011-10-05 ENCOUNTER — Other Ambulatory Visit: Payer: Self-pay | Admitting: Family Medicine

## 2011-10-05 DIAGNOSIS — R928 Other abnormal and inconclusive findings on diagnostic imaging of breast: Secondary | ICD-10-CM

## 2011-10-13 ENCOUNTER — Ambulatory Visit
Admission: RE | Admit: 2011-10-13 | Discharge: 2011-10-13 | Disposition: A | Discharge status: Home / Self Care | Payer: Medicare Other | Source: Ambulatory Visit | Attending: Family Medicine | Admitting: Family Medicine

## 2011-10-13 DIAGNOSIS — R928 Other abnormal and inconclusive findings on diagnostic imaging of breast: Secondary | ICD-10-CM

## 2011-10-22 ENCOUNTER — Ambulatory Visit (HOSPITAL_COMMUNITY)
Admission: RE | Admit: 2011-10-22 | Discharge: 2011-10-22 | Disposition: A | Discharge status: Home / Self Care | Payer: Medicare Other | Source: Ambulatory Visit | Attending: Gastroenterology | Admitting: Gastroenterology

## 2011-10-22 ENCOUNTER — Encounter (HOSPITAL_COMMUNITY): Payer: Self-pay

## 2011-10-22 ENCOUNTER — Encounter (HOSPITAL_COMMUNITY)
Admission: RE | Disposition: A | Discharge status: Home / Self Care | Payer: Self-pay | Source: Ambulatory Visit | Attending: Gastroenterology

## 2011-10-22 DIAGNOSIS — K449 Diaphragmatic hernia without obstruction or gangrene: Secondary | ICD-10-CM | POA: Insufficient documentation

## 2011-10-22 DIAGNOSIS — J4489 Other specified chronic obstructive pulmonary disease: Secondary | ICD-10-CM | POA: Insufficient documentation

## 2011-10-22 DIAGNOSIS — K644 Residual hemorrhoidal skin tags: Secondary | ICD-10-CM | POA: Insufficient documentation

## 2011-10-22 DIAGNOSIS — E119 Type 2 diabetes mellitus without complications: Secondary | ICD-10-CM | POA: Insufficient documentation

## 2011-10-22 DIAGNOSIS — K5730 Diverticulosis of large intestine without perforation or abscess without bleeding: Secondary | ICD-10-CM | POA: Insufficient documentation

## 2011-10-22 DIAGNOSIS — E785 Hyperlipidemia, unspecified: Secondary | ICD-10-CM | POA: Insufficient documentation

## 2011-10-22 DIAGNOSIS — D126 Benign neoplasm of colon, unspecified: Secondary | ICD-10-CM | POA: Insufficient documentation

## 2011-10-22 DIAGNOSIS — I10 Essential (primary) hypertension: Secondary | ICD-10-CM | POA: Insufficient documentation

## 2011-10-22 DIAGNOSIS — D509 Iron deficiency anemia, unspecified: Secondary | ICD-10-CM | POA: Insufficient documentation

## 2011-10-22 DIAGNOSIS — G609 Hereditary and idiopathic neuropathy, unspecified: Secondary | ICD-10-CM | POA: Insufficient documentation

## 2011-10-22 DIAGNOSIS — K648 Other hemorrhoids: Secondary | ICD-10-CM | POA: Insufficient documentation

## 2011-10-22 DIAGNOSIS — J449 Chronic obstructive pulmonary disease, unspecified: Secondary | ICD-10-CM | POA: Insufficient documentation

## 2011-10-22 HISTORY — DX: Depression, unspecified: F32.A

## 2011-10-22 HISTORY — DX: Unspecified osteoarthritis, unspecified site: M19.90

## 2011-10-22 HISTORY — PX: ESOPHAGOGASTRODUODENOSCOPY: SHX5428

## 2011-10-22 HISTORY — DX: Polyneuropathy, unspecified: G62.9

## 2011-10-22 HISTORY — DX: Chronic obstructive pulmonary disease, unspecified: J44.9

## 2011-10-22 HISTORY — PX: COLONOSCOPY: SHX5424

## 2011-10-22 HISTORY — DX: Bronchitis, not specified as acute or chronic: J40

## 2011-10-22 HISTORY — DX: Major depressive disorder, single episode, unspecified: F32.9

## 2011-10-22 HISTORY — DX: Shortness of breath: R06.02

## 2011-10-22 HISTORY — DX: Anemia, unspecified: D64.9

## 2011-10-22 HISTORY — DX: Pneumonia, unspecified organism: J18.9

## 2011-10-22 HISTORY — DX: Unspecified hearing loss, unspecified ear: H91.90

## 2011-10-22 HISTORY — DX: Hyperlipidemia, unspecified: E78.5

## 2011-10-22 HISTORY — DX: Essential (primary) hypertension: I10

## 2011-10-22 LAB — GLUCOSE, CAPILLARY: Glucose-Capillary: 107 mg/dL — ABNORMAL HIGH (ref 70–99)

## 2011-10-22 SURGERY — EGD (ESOPHAGOGASTRODUODENOSCOPY)
Anesthesia: Moderate Sedation

## 2011-10-22 MED ORDER — BUTAMBEN-TETRACAINE-BENZOCAINE 2-2-14 % EX AERO
INHALATION_SPRAY | CUTANEOUS | Status: DC | PRN
  Administered 2011-10-22: 2 via TOPICAL

## 2011-10-22 MED ORDER — FENTANYL NICU IV SYRINGE 50 MCG/ML
INJECTION | INTRAMUSCULAR | Status: DC | PRN
  Administered 2011-10-22 (×4): 25 ug via INTRAVENOUS

## 2011-10-22 MED ORDER — SODIUM CHLORIDE 0.9 % IV SOLN
Freq: Once | INTRAVENOUS | Status: AC
  Administered 2011-10-22: 500 mL via INTRAVENOUS

## 2011-10-22 MED ORDER — FENTANYL CITRATE 0.05 MG/ML IJ SOLN
INTRAMUSCULAR | Status: AC
  Filled 2011-10-22: qty 4

## 2011-10-22 MED ORDER — MIDAZOLAM HCL 10 MG/2ML IJ SOLN
INTRAMUSCULAR | Status: AC
  Filled 2011-10-22: qty 4

## 2011-10-22 MED ORDER — MIDAZOLAM HCL 10 MG/2ML IJ SOLN
INTRAMUSCULAR | Status: DC | PRN
  Administered 2011-10-22 (×3): 2 mg via INTRAVENOUS

## 2011-10-22 NOTE — Op Note (Signed)
[facility] [address] [place], [place]  [address]  OPERATIVE PROCEDURE REPORT  PATIENT:  [name] [name], [name] [name]  MR#:  [id number] BIRTHDATE:  06-Nov-1950  GENDER:  female ENDOSCOPIST:  Jeani Hawking, MD PROCEDURE DATE:  10/22/2011 PROCEDURE:  Colonoscopy with snare polypectomy ASA CLASS:  Class III INDICATIONS:  IDA MEDICATIONS:  Fentanyl 50 mcg IV, Versed 2 mg IV  DESCRIPTION OF PROCEDURE:   After the risks benefits and alternatives of the procedure were thoroughly explained, informed consent was obtained.  Digital rectal exam was performed and revealed no abnormalities.   The  endoscope was introduced through the anus and advanced to the cecum, which was identified by both the appendix and ileocecal valve, without limitations.  The quality of the prep was excellent..  The instrument was then slowly withdrawn as the colon was fully examined. <<PROCEDUREIMAGES>>  FINDINGS:  Two 3 mm sessile hepatic flexure polyps were removed with a cold snare. Scattered left-sided diverticula were identified. No other abnormalities noted.   Retroflexed views in the rectum revealed internal and external hemorrhoids.    The scope was then withdrawn from the patient and the procedure terminated.  COMPLICATIONS:  None  IMPRESSION:  1) Polyp, multiple 2) Internal and external hemorrhoids 3) Diverticula RECOMMENDATIONS:  1) Await biopsy results 2) Repeat colonoscopy in 5-10 years. 3) Schedule for a capsule endoscopy.  ______________________________ Jeani Hawking, MD  n. Rosalie DoctorJeani Hawking at 10/22/2011 12:46 PM  [place], [place] [phone]

## 2011-10-22 NOTE — H&P (Signed)
  Reason for Consult: Iron Deficiency anemia Referring Physician: Jackalyn [name], M.D.  [name] [name] HPI: This is a 61 year old female identified to have IDA on routine testing.  Her anemia was discovered in February this year and it continues to persist.  Her current HGB is at 10.9 g/dL with an MCV of 16.1 and an 8% iron saturation (09/20/2011).  The HGB has remained the same since February this year.  No reports of hematochezia or melena.  She was tried on iron supplementation, but she was not able to tolerate the mediation.  In the past she underwent a colonoscopy 5 years ago with [name] [name] and diverticula were noted.  There is no known family history of colon cancer, but her brother has colonic polyps.    Past Medical History  Diagnosis Date  . COPD (chronic obstructive pulmonary disease)   . Shortness of breath   . Arthritis   . Anemia   . Diabetes mellitus   . Hypertension   . Depression   . Peripheral neuropathy   . Hyperlipemia   . Hearing loss   . Memory loss   . Pneumonia   . Bronchitis     Past Surgical History  Procedure Date  . Tonsillectomy   . Femur fx   . Hip fracture surgery     No family history on file.  Social History:  does not have a smoking history on file. She does not have any smokeless tobacco history on file. Her alcohol and drug histories not on file.  Allergies: Not on File  Medications: Scheduled:   Continuous:    Results for orders placed during the hospital encounter of 10/22/11 (from the past 24 hour(s))  GLUCOSE, CAPILLARY     Status: Abnormal   Collection Time   10/22/11 11:12 AM      Component Value Range   Glucose-Capillary 107 (*) 70 - 99 (mg/dL)     No results found.  ROS:  As stated above in the HPI otherwise negative.  Blood pressure 121/67, temperature 97.8 F (36.6 C), resp. rate 11, SpO2 99.00%.    PE: Gen: NAD, Alert and Oriented HEENT:  [name] [name]/AT, EOMI Neck: Supple, no LAD Lungs: CTA Bilaterally CV: RRR without  M/G/R ABM: Soft, NTND, +BS Ext: No C/C/E  Assessment/Plan: 1) IDA.  Plan: 1) EGD/Colonoscopy.  [name] [name] D 10/22/2011, 11:27 AM

## 2011-10-22 NOTE — Op Note (Signed)
[facility] [address] [place], [place]  [address]  OPERATIVE PROCEDURE REPORT  PATIENT:  [name] [name], [name] [name]  MR#:  [id number] BIRTHDATE:  [birth year]  GENDER:  female ENDOSCOPIST:  Jeani Hawking, MD PROCEDURE DATE:  10/22/2011 PROCEDURE:  EGD with biopsy, [address] ASA CLASS:  Class III INDICATIONS:  IDA MEDICATIONS:  Fentanyl 50 mcg IV, Versed 4 mg IV  DESCRIPTION OF PROCEDURE:   After the risks benefits and alternatives of the procedure were thoroughly explained, informed consent was obtained.  The  endoscope was introduced through the mouth and advanced to the second portion of the duodenum, without limitations.  The instrument was slowly withdrawn as the mucosa was fully examined. <<PROCEDUREIMAGES>>  FINDINGS:  A small 2 cm hiatal hernia was identified. No evidence of any inflammation, ulcerations, erosions, polyps, masses, or vascualar abnormalities. Cold biopsies were obtained from teh small bowel to rule out Celiac disease.    Retroflexed views revealed no abnormalities.    The scope was then withdrawn from the patient and the procedure terminated.  COMPLICATIONS:  None  IMPRESSION:  1) Hiatal hernia RECOMMENDATIONS:  1) Await biopsy results 2) Proceed with the colonoscopy.  ______________________________ Jeani Hawking, MD  n. Rosalie DoctorJeani Hawking at 10/22/2011 12:10 PM  [place], [place] [phone]

## 2011-10-22 NOTE — Discharge Instructions (Signed)
Colonoscopy Care After  Read the instructions outlined below and refer to this sheet in the next few weeks. These discharge instructions provide you with general information on caring for yourself after you leave the hospital. Your doctor may also give you specific instructions. While your treatment has been planned according to the most current medical practices available, unavoidable complications occasionally occur. If you have any problems or questions after discharge, call your doctor. HOME CARE INSTRUCTIONS ACTIVITY:  You may resume your regular activity, but move at a slower pace for the next 24 hours.   Take frequent rest periods for the next 24 hours.   Walking will help get rid of the air and reduce the bloated feeling in your belly (abdomen).   No driving for 24 hours (because of the medicine (anesthesia) used during the test).   You may shower.   Do not sign any important legal documents or operate any machinery for 24 hours (because of the anesthesia used during the test).  NUTRITION:  Drink plenty of fluids.   You may resume your normal diet as instructed by your doctor.   Begin with a light meal and progress to your normal diet. Heavy or fried foods are harder to digest and may make you feel sick to your stomach (nauseated).   Avoid alcoholic beverages for 24 hours or as instructed.  MEDICATIONS:  You may resume your normal medications unless your doctor tells you otherwise.  WHAT TO EXPECT TODAY:  Some feelings of bloating in the abdomen.   Passage of more gas than usual.   Spotting of blood in your stool or on the toilet paper.  IF YOU HAD POLYPS REMOVED DURING THE COLONOSCOPY:  No aspirin products for 7 days or as instructed.   No alcohol for 7 days or as instructed.   Eat a soft diet for the next 24 hours.  FINDING OUT THE RESULTS OF YOUR TEST Not all test results are available during your visit. If your test results are not back during the visit, make  an appointment with your caregiver to find out the results. Do not assume everything is normal if you have not heard from your caregiver or the medical facility. It is important for you to follow up on all of your test results.  SEEK IMMEDIATE MEDICAL CARE IF:  You have more than a spotting of blood in your stool.   Your belly is swollen (abdominal distention).   You are nauseated or vomiting.   You have a fever.   You have abdominal pain or discomfort that is severe or gets worse throughout the day.  Document Released: 12/16/2003 Document Revised: 04/22/2011 Document Reviewed: 12/14/2007 [facility] Patient Information 2012 [place], [place].Colon Polyps A polyp is extra tissue that grows inside your body. Colon polyps grow in the large intestine. The large intestine, also called the colon, is part of your digestive system. It is a long, hollow tube at the end of your digestive tract where your body makes and stores stool. Most polyps are not dangerous. They are benign. This means they are not cancerous. But over time, some types of polyps can turn into cancer. Polyps that are smaller than a pea are usually not harmful. But larger polyps could someday become or may already be cancerous. To be safe, doctors remove all polyps and test them.  WHO GETS POLYPS? Anyone can get polyps, but certain people are more likely than others. You may have a greater chance of getting polyps if:  You are over 50.   You have had polyps before.   Someone in your family has had polyps.   Someone in your family has had cancer of the large intestine.   Find out if someone in your family has had polyps. You may also be more likely to get polyps if you:   Eat a lot of fatty foods.   Smoke.   Drink alcohol.   Do not exercise.   Eat too much.  SYMPTOMS  Most small polyps do not cause symptoms. People often do not know they have one until their caregiver finds it during a regular checkup or while testing them  for something else. Some people do have symptoms like these:  Bleeding from the anus. You might notice blood on your underwear or on toilet paper after you have had a bowel movement.   Constipation or diarrhea that lasts more than a week.   Blood in the stool. Blood can make stool look black or it can show up as red streaks in the stool.  If you have any of these symptoms, see your caregiver. HOW DOES THE DOCTOR TEST FOR POLYPS? The doctor can use four tests to check for polyps:  Digital rectal exam. The caregiver wears gloves and checks your rectum (the last part of the large intestine) to see if it feels normal. This test would find polyps only in the rectum. Your caregiver may need to do one of the other tests listed below to find polyps higher up in the intestine.   Barium enema. The caregiver puts a liquid called barium into your rectum before taking x-rays of your large intestine. Barium makes your intestine look white in the pictures. Polyps are dark, so they are easy to see.   Sigmoidoscopy. With this test, the caregiver can see inside your large intestine. A thin flexible tube is placed into your rectum. The device is called a sigmoidoscope, which has a light and a tiny video camera in it. The caregiver uses the sigmoidoscope to look at the last third of your large intestine.   Colonoscopy. This test is like sigmoidoscopy, but the caregiver looks at all of the large intestine. It usually requires sedation. This is the most common method for finding and removing polyps.  TREATMENT   The caregiver will remove the polyp during sigmoidoscopy or colonoscopy. The polyp is then tested for cancer.   If you have had polyps, your caregiver may want you to get tested regularly in the future.  PREVENTION  There is not one sure way to prevent polyps. You might be able to lower your risk of getting them if you:  Eat more fruits and vegetables and less fatty food.   Do not smoke.   Avoid  alcohol.   Exercise every day.   Lose weight if you are overweight.   Eating more calcium and folate can also lower your risk of getting polyps. Some foods that are rich in calcium are milk, cheese, and broccoli. Some foods that are rich in folate are chickpeas, kidney beans, and spinach.   Aspirin might help prevent polyps. Studies are under way.   ***Dr. Jeani Hawking [phone]  Endoscopy Care After  Please read the instructions outlined below and refer to this sheet in the next few weeks. These discharge instructions provide you with general information on caring for yourself after you leave the hospital. Your doctor may also give you specific instructions. While your treatment has been planned according to the  most current medical practices available, unavoidable complications occasionally occur. If you have any problems or questions after discharge, please call your doctor. HOME CARE INSTRUCTIONS Activity You may resume your regular activity but move at a slower pace for the next 24 hours.  Take frequent rest periods for the next 24 hours.  Walking will help expel (get rid of) the air and reduce the bloated feeling in your abdomen.  No driving for 24 hours (because of the anesthesia (medicine) used during the test).  You may shower.  Do not sign any important legal documents or operate any machinery for 24 hours (because of the anesthesia used during the test).  Nutrition Drink plenty of fluids.  You may resume your normal diet.  Begin with a light meal and progress to your normal diet.  Avoid alcoholic beverages for 24 hours or as instructed by your caregiver.  Medications You may resume your normal medications unless your caregiver tells you otherwise. What you can expect today You may experience abdominal discomfort such as a feeling of fullness or "gas" pains.  You may experience a sore throat for 2 to 3 days. This is normal. Gargling with salt water may help this.   Follow-up Your doctor will discuss the results of your test with you. SEEK IMMEDIATE MEDICAL CARE IF: You have excessive nausea (feeling sick to your stomach) and/or vomiting.  You have severe abdominal pain and distention (swelling).  You have trouble swallowing.  You have a temperature over 100 F (37.8 C).  You have rectal bleeding or vomiting of blood.  Document Released: 12/16/2003 Document Revised: 04/22/2011 Document Reviewed: 06/28/2007 [facility] Patient Information 2012 [place], [place].

## 2011-10-25 ENCOUNTER — Encounter (HOSPITAL_COMMUNITY): Payer: Self-pay | Admitting: Gastroenterology

## 2011-10-25 ENCOUNTER — Encounter (HOSPITAL_COMMUNITY): Payer: Self-pay

## 2012-03-20 ENCOUNTER — Encounter (HOSPITAL_COMMUNITY): Payer: Self-pay | Admitting: Pharmacy Technician

## 2012-03-23 ENCOUNTER — Encounter (HOSPITAL_COMMUNITY)
Admission: RE | Admit: 2012-03-23 | Discharge: 2012-03-23 | Disposition: A | Discharge status: Home / Self Care | Payer: Medicare Other | Source: Ambulatory Visit | Attending: Orthopedic Surgery | Admitting: Orthopedic Surgery

## 2012-03-23 ENCOUNTER — Ambulatory Visit (HOSPITAL_COMMUNITY)
Admission: RE | Admit: 2012-03-23 | Discharge: 2012-03-23 | Disposition: A | Discharge status: Home / Self Care | Payer: Medicare Other | Source: Ambulatory Visit | Attending: Orthopedic Surgery | Admitting: Orthopedic Surgery

## 2012-03-23 ENCOUNTER — Encounter (HOSPITAL_COMMUNITY): Payer: Self-pay

## 2012-03-23 DIAGNOSIS — M479 Spondylosis, unspecified: Secondary | ICD-10-CM | POA: Insufficient documentation

## 2012-03-23 DIAGNOSIS — Z01818 Encounter for other preprocedural examination: Secondary | ICD-10-CM | POA: Insufficient documentation

## 2012-03-23 DIAGNOSIS — Z01812 Encounter for preprocedural laboratory examination: Secondary | ICD-10-CM | POA: Insufficient documentation

## 2012-03-23 HISTORY — DX: Candidiasis of skin and nail: B37.2

## 2012-03-23 HISTORY — DX: Encounter for other specified aftercare: Z51.89

## 2012-03-23 HISTORY — DX: Gastro-esophageal reflux disease without esophagitis: K21.9

## 2012-03-23 LAB — PROTIME-INR
INR: 0.95 (ref 0.00–1.49)
Prothrombin Time: 12.6 seconds (ref 11.6–15.2)

## 2012-03-23 LAB — URINALYSIS, ROUTINE W REFLEX MICROSCOPIC
Bilirubin Urine: NEGATIVE
Hgb urine dipstick: NEGATIVE
Nitrite: NEGATIVE
Protein, ur: NEGATIVE mg/dL
Urobilinogen, UA: 0.2 mg/dL (ref 0.0–1.0)

## 2012-03-23 LAB — BASIC METABOLIC PANEL
BUN: 17 mg/dL (ref 6–23)
Calcium: 9.7 mg/dL (ref 8.4–10.5)
GFR calc Af Amer: 72 mL/min — ABNORMAL LOW (ref >90)
GFR calc non Af Amer: 62 mL/min — ABNORMAL LOW (ref >90)
Glucose, Bld: 105 mg/dL — ABNORMAL HIGH (ref 70–99)
Potassium: 4.5 mEq/L (ref 3.5–5.1)
Sodium: 136 mEq/L (ref 135–145)

## 2012-03-23 LAB — CBC
MCH: 24.7 pg — ABNORMAL LOW (ref 26.0–34.0)
MCHC: 30.9 g/dL (ref 30.0–36.0)
Platelets: 402 10*3/uL — ABNORMAL HIGH (ref 150–400)
RBC: 4.29 MIL/uL (ref 3.87–5.11)

## 2012-03-23 NOTE — Progress Notes (Signed)
03/23/12 1143  OBSTRUCTIVE SLEEP APNEA  Have you ever been diagnosed with sleep apnea through a sleep study? No  Do you snore loudly (loud enough to be heard through closed doors)?  0  Do you often feel tired, fatigued, or sleepy during the daytime? 1  Has anyone observed you stop breathing during your sleep? 0  Do you have, or are you being treated for high blood pressure? 1  BMI more than 35 kg/m2? 1  Age over 63 years old? 1  Neck circumference greater than 40 cm/18 inches? 0  Gender: 0  Obstructive Sleep Apnea Score 4   Score 4 or greater  Results sent to PCP

## 2012-03-23 NOTE — Patient Instructions (Addendum)
[name] [name]  03/23/2012   Your procedure is scheduled on: 03/27/12  Monday   Surgery [id number]   Report to [facility] at 0515      AM.  Call this number if you have problems the morning of surgery: [phone]    BRING INHALERS WITH YOU TO HOSPITAL  Remember:   Do not eat food  Or drink :After Midnight.SUNDAY NIGHT   Take these medicines the morning of surgery with A SIP OF WATER: NEURONTIN                                Restasis drops,    COMBIVENT DO NOT TAKE ANY BLOOD SUGAR MEDICINE THE MORNING OF SURGERY  .  Contacts, dentures or partial plates can not be worn to surgery  Leave suitcase in the car. After surgery it may be brought to your room.  For patients admitted to the hospital, checkout time is 11:00 AM day of  discharge.             SPECIAL INSTRUCTIONS- SEE  PREPARING FOR SURGERY INSTRUCTION SHEET-     DO NOT WEAR JEWELRY, LOTIONS, POWDERS, OR PERFUMES.  WOMEN-- DO NOT SHAVE LEGS OR UNDERARMS FOR 12 HOURS BEFORE SHOWERS. MEN MAY SHAVE FACE.  Patients discharged the day of surgery will not be allowed to drive home. IF going home the day of surgery, you must have a driver and someone to stay with you for the first 24 hours  Name and phone number of your driver: admission                                                                       Please read over the following fact sheets that you were given: MRSA Information, Incentive Spirometry Sheet, Blood Transfusion Sheet  Information                                                                                   [name] [name]  PST 336  [phone]

## 2012-03-23 NOTE — Progress Notes (Signed)
EKG 2/13 chart,   Clearance with note C MANN PA on chart

## 2012-03-23 NOTE — H&P (Signed)
[name] [name] is an 61 y.o. female.    Chief Complaint:   Right hip retained hardware, with right knee OA / pain.  HPI: Pt is a 61 y.o. female complaining of right knee pain for many years. Pain had continually increased since the beginning. X-rays in the clinic show end-stage arthritic changes of the right knee. She has had a prior right hip fracture in 2012 that was fixed with a long rod. She had a periprosthetic fracture in 2011 around the long rod.  The hip at this time looks to be healed, however prior to having knee surgery the retained hardware will need to be removed / exchanged for a short rod.  Various options are discussed with the patient. Risks, benefits and expectations were discussed with the patient. Patient understand the risks, benefits and expectations and wishes to proceed with surgery. She knows that the procedure will be to remove the rod or exchange it with a short rod and allow the hip to heal prior to address the right knee for surgery.  PCP:  No primary provider on file.  D/C Plans:   SNF - [facility]  Post-op Meds:  No Rx given   Tranexamic Acid:   To be given  Decadron:   Not to be given - DM  PMH: Past Medical History  Diagnosis Date  . COPD (chronic obstructive pulmonary disease)   . Shortness of breath   . Arthritis   . Anemia   . Diabetes mellitus   . Hypertension   . Depression   . Peripheral neuropathy   . Hyperlipemia   . Hearing loss   . Memory loss   . Pneumonia   . Bronchitis   . GERD (gastroesophageal reflux disease)   . Encounter for blood transfusion   . Skin yeast infection     history of under bilateral breast,, pannus, inner legs    PSH: Past Surgical History  Procedure Date  . Tonsillectomy   . Femur fx   . Hip fracture surgery   . Esophagogastroduodenoscopy 10/22/2011    Procedure: ESOPHAGOGASTRODUODENOSCOPY (EGD);  Surgeon: Theda Belfast, MD;  Location: [facility];  Service: Endoscopy;  Laterality: N/A;  . Colonoscopy  10/22/2011    Procedure: COLONOSCOPY;  Surgeon: Theda Belfast, MD;  Location: [facility];  Service: Endoscopy;  Laterality: N/A;    Social History:  reports that she quit smoking about 4 years ago. She has never used smokeless tobacco. She reports that she does not drink alcohol or use illicit drugs.  Allergies:  Allergies  Allergen Reactions  . Coffea Arabica Nausea And Vomiting  . Other Other (See Comments)    Plain nystatin ointment-makes skin get worse  . Prednisone Other (See Comments)    Tightness in chest    Medications: No current facility-administered medications for this encounter.   Current Outpatient Prescriptions  Medication Sig Dispense Refill  . acetaminophen (TYLENOL) 500 MG tablet Take 1,000 mg by mouth every 6 (six) hours as needed.      Marland Kitchen albuterol-ipratropium (COMBIVENT) 18-103 MCG/ACT inhaler Inhale 2 puffs into the lungs every 6 (six) hours as needed. Wheezing and shortness of breath      . alendronate (FOSAMAX) 70 MG tablet Take 70 mg by mouth every 7 (seven) days. Take with a full glass of water on an empty stomach.      Marland Kitchen aspirin 81 MG tablet Take 81 mg by mouth daily.      . Calcium Carbonate-Vitamin D (CALTRATE 600+D  PO) Take 1 tablet by mouth daily.      . cycloSPORINE (RESTASIS) 0.05 % ophthalmic emulsion Place 1 drop into both eyes 2 (two) times daily.      . diclofenac sodium (VOLTAREN) 1 % GEL Apply 2 g topically 3 (three) times daily.       . fish oil-omega-3 fatty acids 1000 MG capsule Take 1 g by mouth 2 (two) times daily.       Marland Kitchen gabapentin (NEURONTIN) 600 MG tablet Take 600 mg by mouth 2 (two) times daily.       Marland Kitchen glucosamine-chondroitin 500-400 MG tablet Take 1 tablet by mouth 2 (two) times daily.       Marland Kitchen lisinopril (PRINIVIL,ZESTRIL) 20 MG tablet Take 20 mg by mouth daily before breakfast.       . metFORMIN (GLUCOPHAGE) 500 MG tablet Take 500 mg by mouth 2 (two) times daily with a meal.      . Multiple Vitamin (MULTIVITAMIN WITH MINERALS) TABS  Take 1 tablet by mouth daily.      . Multiple Vitamins-Minerals (OCUVITE EYE HEALTH FORMULA) CAPS Take 1 capsule by mouth daily.      Marland Kitchen nystatin-triamcinolone (MYCOLOG II) cream Apply 1 application topically 2 (two) times daily.      Marland Kitchen omeprazole (PRILOSEC) 20 MG capsule Take 20 mg by mouth at bedtime.       Bertram Gala Glycol-Propyl Glycol (SYSTANE) 0.4-0.3 % SOLN Place 1 drop into both eyes 2 (two) times daily.       . risperiDONE (RISPERDAL) 2 MG tablet Take 2 mg by mouth every evening.        Results for orders placed during the hospital encounter of 03/23/12 (from the past 48 hour(s))  URINALYSIS, ROUTINE W REFLEX MICROSCOPIC     Status: Normal   Collection Time   03/23/12 11:09 AM      Component Value Range Comment   Color, Urine YELLOW  YELLOW    APPearance CLEAR  CLEAR    Specific Gravity, Urine 1.014  1.005 - 1.030    pH 5.5  5.0 - 8.0    Glucose, UA NEGATIVE  NEGATIVE mg/dL    Hgb urine dipstick NEGATIVE  NEGATIVE    Bilirubin Urine NEGATIVE  NEGATIVE    Ketones, ur NEGATIVE  NEGATIVE mg/dL    Protein, ur NEGATIVE  NEGATIVE mg/dL    Urobilinogen, UA 0.2  0.0 - 1.0 mg/dL    Nitrite NEGATIVE  NEGATIVE    Leukocytes, UA NEGATIVE  NEGATIVE MICROSCOPIC NOT DONE ON URINES WITH NEGATIVE PROTEIN, BLOOD, LEUKOCYTES, NITRITE, OR GLUCOSE <1000 mg/dL.  SURGICAL PCR SCREEN     Status: Abnormal   Collection Time   03/23/12 11:09 AM      Component Value Range Comment   MRSA, PCR NEGATIVE  NEGATIVE    Staphylococcus aureus POSITIVE (*) NEGATIVE   APTT     Status: Normal   Collection Time   03/23/12 12:10 PM      Component Value Range Comment   aPTT 33  24 - 37 seconds   BASIC METABOLIC PANEL     Status: Abnormal   Collection Time   03/23/12 12:10 PM      Component Value Range Comment   Sodium 136  135 - 145 mEq/L    Potassium 4.5  3.5 - 5.1 mEq/L    Chloride 100  96 - 112 mEq/L    CO2 26  19 - 32 mEq/L    Glucose, Bld 105 (*) 70 -  99 mg/dL    BUN 17  6 - 23 mg/dL    Creatinine,  Ser 1.61  0.50 - 1.10 mg/dL    Calcium 9.7  8.4 - 09.6 mg/dL    GFR calc non Af Amer 62 (*) >90 mL/min    GFR calc Af Amer 72 (*) >90 mL/min   CBC     Status: Abnormal   Collection Time   03/23/12 12:10 PM      Component Value Range Comment   WBC 10.4  4.0 - 10.5 K/uL    RBC 4.29  3.87 - 5.11 MIL/uL    Hemoglobin 10.6 (*) 12.0 - 15.0 g/dL    HCT 04.5 (*) 40.9 - 46.0 %    MCV 80.0  78.0 - 100.0 fL    MCH 24.7 (*) 26.0 - 34.0 pg    MCHC 30.9  30.0 - 36.0 g/dL    RDW 81.1  91.4 - 78.2 %    Platelets 402 (*) 150 - 400 K/uL   PROTIME-INR     Status: Normal   Collection Time   03/23/12 12:10 PM      Component Value Range Comment   Prothrombin Time 12.6  11.6 - 15.2 seconds    INR 0.95  0.00 - 1.49    Dg Chest 2 View  03/23/2012  *RADIOLOGY REPORT*  Clinical Data: Preop radiograph.  Hardware removal from right hip.  CHEST - 2 VIEW  Comparison: 07/10/2009  Findings: The heart size and mediastinal contours are within normal limits.  Both lungs are clear.  The visualized skeletal structures is significant for mild multilevel spondylosis.  IMPRESSION: Negative exam.   Original Report Authenticated By: Signa Kell, M.D.     ROS: Review of Systems  Constitutional: Negative.   HENT: Negative.   Eyes: Negative.   Respiratory: Negative.   Cardiovascular: Negative.   Gastrointestinal: Negative.   Genitourinary: Negative.   Musculoskeletal: Positive for joint pain.  Skin: Negative.   Neurological: Negative.   Endo/Heme/Allergies: Negative.   Psychiatric/Behavioral: Negative.      Physical Exam: BP:   103/50  ;  HR:   100  ; Resp:   18  ; Physical Exam  Constitutional: She is oriented to person, place, and time and well-developed, well-nourished, and in no distress.  HENT:  Head: Normocephalic and atraumatic.  Mouth/Throat: Oropharynx is clear and moist.  Eyes: Pupils are equal, round, and reactive to light.  Neck: Neck supple. No JVD present. No tracheal deviation present. No  thyromegaly present.  Cardiovascular: Normal rate, normal heart sounds and intact distal pulses.   Pulmonary/Chest: Effort normal and breath sounds normal. No respiratory distress. She has no wheezes.  Abdominal: There is no tenderness. There is no guarding.  Lymphadenopathy:    She has no cervical adenopathy.  Neurological: She is alert and oriented to person, place, and time.  Skin: Skin is warm and dry.  Psychiatric: Affect normal.      Assessment/Plan Assessment:   Right hip retained hardware, with right knee OA / pain.  Plan: Patient will undergo a removal of right femoral deep implant and possible exchange with shorter trochanteric nail on 03/27/2012 per Dr. Charlann Boxer at [facility]. Risks benefits and expectations were discussed with the patient. Patient understand risks, benefits and expectations and wishes to proceed.   Anastasio Auerbach Meagan Spease   PAC  03/23/2012, 3:19 PM

## 2012-03-24 ENCOUNTER — Encounter (HOSPITAL_COMMUNITY): Payer: Self-pay | Admitting: *Deleted

## 2012-03-27 ENCOUNTER — Encounter (HOSPITAL_COMMUNITY): Payer: Self-pay | Admitting: Anesthesiology

## 2012-03-27 ENCOUNTER — Inpatient Hospital Stay (HOSPITAL_COMMUNITY): Payer: Medicare Other

## 2012-03-27 ENCOUNTER — Inpatient Hospital Stay (HOSPITAL_COMMUNITY): Payer: Medicare Other | Admitting: Anesthesiology

## 2012-03-27 ENCOUNTER — Inpatient Hospital Stay (HOSPITAL_COMMUNITY)
Admission: RE | Admit: 2012-03-27 | Discharge: 2012-03-30 | DRG: 481 | Disposition: A | Discharge status: Skilled Nursing Facility | Payer: Medicare Other | Source: Ambulatory Visit | Attending: Orthopedic Surgery | Admitting: Orthopedic Surgery

## 2012-03-27 ENCOUNTER — Encounter (HOSPITAL_COMMUNITY)
Admission: RE | Disposition: A | Discharge status: Skilled Nursing Facility | Payer: Self-pay | Source: Ambulatory Visit | Attending: Orthopedic Surgery

## 2012-03-27 ENCOUNTER — Encounter (HOSPITAL_COMMUNITY): Payer: Self-pay

## 2012-03-27 DIAGNOSIS — D62 Acute posthemorrhagic anemia: Secondary | ICD-10-CM

## 2012-03-27 DIAGNOSIS — E119 Type 2 diabetes mellitus without complications: Secondary | ICD-10-CM | POA: Diagnosis present

## 2012-03-27 DIAGNOSIS — Z969 Presence of functional implant, unspecified: Secondary | ICD-10-CM

## 2012-03-27 DIAGNOSIS — M1710 Unilateral primary osteoarthritis, unspecified knee: Secondary | ICD-10-CM | POA: Diagnosis present

## 2012-03-27 DIAGNOSIS — J4489 Other specified chronic obstructive pulmonary disease: Secondary | ICD-10-CM | POA: Diagnosis present

## 2012-03-27 DIAGNOSIS — F3289 Other specified depressive episodes: Secondary | ICD-10-CM | POA: Diagnosis present

## 2012-03-27 DIAGNOSIS — I10 Essential (primary) hypertension: Secondary | ICD-10-CM | POA: Diagnosis present

## 2012-03-27 DIAGNOSIS — J449 Chronic obstructive pulmonary disease, unspecified: Secondary | ICD-10-CM | POA: Diagnosis present

## 2012-03-27 DIAGNOSIS — K219 Gastro-esophageal reflux disease without esophagitis: Secondary | ICD-10-CM | POA: Diagnosis present

## 2012-03-27 DIAGNOSIS — S72009S Fracture of unspecified part of neck of unspecified femur, sequela: Secondary | ICD-10-CM

## 2012-03-27 DIAGNOSIS — IMO0002 Reserved for concepts with insufficient information to code with codable children: Principal | ICD-10-CM | POA: Diagnosis present

## 2012-03-27 DIAGNOSIS — E871 Hypo-osmolality and hyponatremia: Secondary | ICD-10-CM | POA: Diagnosis not present

## 2012-03-27 DIAGNOSIS — Z6841 Body Mass Index (BMI) 40.0 and over, adult: Secondary | ICD-10-CM

## 2012-03-27 DIAGNOSIS — F329 Major depressive disorder, single episode, unspecified: Secondary | ICD-10-CM | POA: Diagnosis present

## 2012-03-27 HISTORY — PX: INTRAMEDULLARY (IM) NAIL INTERTROCHANTERIC: SHX5875

## 2012-03-27 HISTORY — PX: HARDWARE REMOVAL: SHX979

## 2012-03-27 LAB — GLUCOSE, CAPILLARY
Glucose-Capillary: 119 mg/dL — ABNORMAL HIGH (ref 70–99)
Glucose-Capillary: 173 mg/dL — ABNORMAL HIGH (ref 70–99)
Glucose-Capillary: 134 mg/dL — ABNORMAL HIGH (ref 70–99)
Glucose-Capillary: 117 mg/dL — ABNORMAL HIGH (ref 70–99)

## 2012-03-27 LAB — POCT I-STAT 4, (NA,K, GLUC, HGB,HCT)
Glucose, Bld: 159 mg/dL — ABNORMAL HIGH (ref 70–99)
Hemoglobin: 9.2 g/dL — ABNORMAL LOW (ref 12.0–15.0)
Potassium: 4.4 mEq/L (ref 3.5–5.1)

## 2012-03-27 SURGERY — REMOVAL, HARDWARE
Anesthesia: General | Site: Hip | Laterality: Right | Wound class: Clean

## 2012-03-27 MED ORDER — CEFAZOLIN SODIUM-DEXTROSE 2-3 GM-% IV SOLR
2.0000 g | Freq: Four times a day (QID) | INTRAVENOUS | Status: AC
  Administered 2012-03-27 (×2): 2 g via INTRAVENOUS
  Filled 2012-03-27 (×2): qty 50

## 2012-03-27 MED ORDER — BISACODYL 10 MG RE SUPP: 10.0000 mg | Freq: Every day | RECTAL | Status: DC | PRN

## 2012-03-27 MED ORDER — MIDAZOLAM HCL 5 MG/5ML IJ SOLN
INTRAMUSCULAR | Status: DC | PRN
  Administered 2012-03-27: 0.5 mg via INTRAVENOUS

## 2012-03-27 MED ORDER — GABAPENTIN 600 MG PO TABS: 600.0000 mg | ORAL_TABLET | Freq: Two times a day (BID) | ORAL | Status: DC

## 2012-03-27 MED ORDER — HYDROCODONE-ACETAMINOPHEN 7.5-325 MG PO TABS
1.0000 | ORAL_TABLET | ORAL | Status: DC
  Administered 2012-03-27 – 2012-03-29 (×6): 1 via ORAL
  Administered 2012-03-29 – 2012-03-30 (×2): 2 via ORAL
  Administered 2012-03-30: 1 via ORAL
  Filled 2012-03-27: qty 2
  Filled 2012-03-27 (×4): qty 1
  Filled 2012-03-27 (×2): qty 2
  Filled 2012-03-27: qty 1
  Filled 2012-03-27: qty 2
  Filled 2012-03-27: qty 1

## 2012-03-27 MED ORDER — POLYETHYL GLYCOL-PROPYL GLYCOL 0.4-0.3 % OP SOLN: 1.0000 [drp] | Freq: Two times a day (BID) | OPHTHALMIC | Status: DC

## 2012-03-27 MED ORDER — CEFAZOLIN SODIUM-DEXTROSE 2-3 GM-% IV SOLR
2.0000 g | INTRAVENOUS | Status: AC
  Administered 2012-03-27: 2 g via INTRAVENOUS

## 2012-03-27 MED ORDER — PROMETHAZINE HCL 25 MG/ML IJ SOLN: 6.2500 mg | INTRAMUSCULAR | Status: DC | PRN

## 2012-03-27 MED ORDER — METOCLOPRAMIDE HCL 10 MG PO TABS: 5.0000 mg | ORAL_TABLET | Freq: Three times a day (TID) | ORAL | Status: DC | PRN

## 2012-03-27 MED ORDER — PHENYLEPHRINE HCL 10 MG/ML IJ SOLN
INTRAMUSCULAR | Status: DC | PRN
  Administered 2012-03-27: 40 ug via INTRAVENOUS
  Administered 2012-03-27 (×3): 20 ug via INTRAVENOUS
  Administered 2012-03-27: 10 ug via INTRAVENOUS
  Administered 2012-03-27 (×2): 20 ug via INTRAVENOUS

## 2012-03-27 MED ORDER — MENTHOL 3 MG MT LOZG
1.0000 | LOZENGE | OROMUCOSAL | Status: DC | PRN
  Filled 2012-03-27: qty 9

## 2012-03-27 MED ORDER — CEFAZOLIN SODIUM-DEXTROSE 2-3 GM-% IV SOLR
INTRAVENOUS | Status: AC
  Filled 2012-03-27: qty 50

## 2012-03-27 MED ORDER — HYDROMORPHONE HCL PF 1 MG/ML IJ SOLN
INTRAMUSCULAR | Status: DC | PRN
  Administered 2012-03-27: 1 mg via INTRAVENOUS

## 2012-03-27 MED ORDER — 0.9 % SODIUM CHLORIDE (POUR BTL) OPTIME
TOPICAL | Status: DC | PRN
  Administered 2012-03-27: 1000 mL

## 2012-03-27 MED ORDER — LIDOCAINE HCL (CARDIAC) 20 MG/ML IV SOLN
INTRAVENOUS | Status: DC | PRN
  Administered 2012-03-27: 75 mg via INTRAVENOUS

## 2012-03-27 MED ORDER — IPRATROPIUM-ALBUTEROL 18-103 MCG/ACT IN AERO: 2.0000 | INHALATION_SPRAY | Freq: Four times a day (QID) | RESPIRATORY_TRACT | Status: DC | PRN

## 2012-03-27 MED ORDER — POLYETHYLENE GLYCOL 3350 17 G PO PACK
17.0000 g | PACK | Freq: Two times a day (BID) | ORAL | Status: DC
  Administered 2012-03-27 – 2012-03-30 (×6): 17 g via ORAL

## 2012-03-27 MED ORDER — ONDANSETRON HCL 4 MG/2ML IJ SOLN: 4.0000 mg | Freq: Four times a day (QID) | INTRAMUSCULAR | Status: DC | PRN

## 2012-03-27 MED ORDER — ACETAMINOPHEN 10 MG/ML IV SOLN: 1000.0000 mg | Freq: Once | INTRAVENOUS | Status: DC | PRN

## 2012-03-27 MED ORDER — ZOLPIDEM TARTRATE 5 MG PO TABS: 5.0000 mg | ORAL_TABLET | Freq: Every evening | ORAL | Status: DC | PRN

## 2012-03-27 MED ORDER — RISPERIDONE 2 MG PO TABS
2.0000 mg | ORAL_TABLET | Freq: Every evening | ORAL | Status: DC
  Administered 2012-03-27 – 2012-03-29 (×3): 2 mg via ORAL
  Filled 2012-03-27 (×4): qty 1

## 2012-03-27 MED ORDER — PHENOL 1.4 % MT LIQD
1.0000 | OROMUCOSAL | Status: DC | PRN
  Filled 2012-03-27: qty 177

## 2012-03-27 MED ORDER — LACTATED RINGERS IV SOLN
INTRAVENOUS | Status: DC | PRN
  Administered 2012-03-27 (×4): via INTRAVENOUS

## 2012-03-27 MED ORDER — HYDROMORPHONE HCL PF 1 MG/ML IJ SOLN: 0.5000 mg | INTRAMUSCULAR | Status: DC | PRN

## 2012-03-27 MED ORDER — PANTOPRAZOLE SODIUM 40 MG PO TBEC
40.0000 mg | DELAYED_RELEASE_TABLET | Freq: Every day | ORAL | Status: DC
  Administered 2012-03-27 – 2012-03-29 (×3): 40 mg via ORAL
  Filled 2012-03-27 (×4): qty 1

## 2012-03-27 MED ORDER — HYDROMORPHONE HCL PF 1 MG/ML IJ SOLN: 0.2500 mg | INTRAMUSCULAR | Status: DC | PRN

## 2012-03-27 MED ORDER — ACETAMINOPHEN 10 MG/ML IV SOLN
INTRAVENOUS | Status: DC | PRN
  Administered 2012-03-27: 1000 mg via INTRAVENOUS

## 2012-03-27 MED ORDER — MEPERIDINE HCL 50 MG/ML IJ SOLN: 6.2500 mg | INTRAMUSCULAR | Status: DC | PRN

## 2012-03-27 MED ORDER — METOCLOPRAMIDE HCL 5 MG/ML IJ SOLN: 5.0000 mg | Freq: Three times a day (TID) | INTRAMUSCULAR | Status: DC | PRN

## 2012-03-27 MED ORDER — ONDANSETRON HCL 4 MG PO TABS: 4.0000 mg | ORAL_TABLET | Freq: Four times a day (QID) | ORAL | Status: DC | PRN

## 2012-03-27 MED ORDER — PROPOFOL 10 MG/ML IV EMUL
INTRAVENOUS | Status: DC | PRN
  Administered 2012-03-27: 125 mg via INTRAVENOUS
  Administered 2012-03-27: 75 mg via INTRAVENOUS

## 2012-03-27 MED ORDER — DOCUSATE SODIUM 100 MG PO CAPS
100.0000 mg | ORAL_CAPSULE | Freq: Two times a day (BID) | ORAL | Status: DC
  Administered 2012-03-27 – 2012-03-30 (×6): 100 mg via ORAL

## 2012-03-27 MED ORDER — CYCLOSPORINE 0.05 % OP EMUL
1.0000 [drp] | Freq: Two times a day (BID) | OPHTHALMIC | Status: DC
  Administered 2012-03-27 – 2012-03-30 (×6): 1 [drp] via OPHTHALMIC
  Filled 2012-03-27 (×7): qty 1

## 2012-03-27 MED ORDER — METHOCARBAMOL 100 MG/ML IJ SOLN
500.0000 mg | Freq: Four times a day (QID) | INTRAVENOUS | Status: DC | PRN
  Administered 2012-03-27: 500 mg via INTRAVENOUS
  Filled 2012-03-27: qty 5

## 2012-03-27 MED ORDER — OXYCODONE HCL 5 MG/5ML PO SOLN
5.0000 mg | Freq: Once | ORAL | Status: DC | PRN
  Filled 2012-03-27: qty 5

## 2012-03-27 MED ORDER — METFORMIN HCL 500 MG PO TABS
500.0000 mg | ORAL_TABLET | Freq: Two times a day (BID) | ORAL | Status: DC
  Administered 2012-03-27 – 2012-03-30 (×6): 500 mg via ORAL
  Filled 2012-03-27 (×8): qty 1

## 2012-03-27 MED ORDER — METHOCARBAMOL 500 MG PO TABS
500.0000 mg | ORAL_TABLET | Freq: Four times a day (QID) | ORAL | Status: DC | PRN
  Administered 2012-03-28: 500 mg via ORAL
  Filled 2012-03-27: qty 1

## 2012-03-27 MED ORDER — RIVAROXABAN 10 MG PO TABS
10.0000 mg | ORAL_TABLET | ORAL | Status: DC
  Administered 2012-03-28 – 2012-03-30 (×3): 10 mg via ORAL
  Filled 2012-03-27 (×4): qty 1

## 2012-03-27 MED ORDER — FERROUS SULFATE 325 (65 FE) MG PO TABS
325.0000 mg | ORAL_TABLET | Freq: Three times a day (TID) | ORAL | Status: DC
  Administered 2012-03-27 – 2012-03-30 (×9): 325 mg via ORAL
  Filled 2012-03-27 (×11): qty 1

## 2012-03-27 MED ORDER — TRANEXAMIC ACID 100 MG/ML IV SOLN: 15.0000 mg/kg | Freq: Once | INTRAVENOUS | Status: DC

## 2012-03-27 MED ORDER — GABAPENTIN 300 MG PO CAPS
600.0000 mg | ORAL_CAPSULE | Freq: Two times a day (BID) | ORAL | Status: DC
  Administered 2012-03-27 – 2012-03-30 (×6): 600 mg via ORAL
  Filled 2012-03-27 (×7): qty 2

## 2012-03-27 MED ORDER — CISATRACURIUM BESYLATE (PF) 10 MG/5ML IV SOLN
INTRAVENOUS | Status: DC | PRN
  Administered 2012-03-27: 6 mg via INTRAVENOUS
  Administered 2012-03-27: 2 mg via INTRAVENOUS

## 2012-03-27 MED ORDER — OXYCODONE HCL 5 MG PO TABS: 5.0000 mg | ORAL_TABLET | Freq: Once | ORAL | Status: DC | PRN

## 2012-03-27 MED ORDER — CELECOXIB 200 MG PO CAPS
200.0000 mg | ORAL_CAPSULE | Freq: Two times a day (BID) | ORAL | Status: DC
  Administered 2012-03-27 – 2012-03-30 (×6): 200 mg via ORAL
  Filled 2012-03-27 (×7): qty 1

## 2012-03-27 MED ORDER — POLYVINYL ALCOHOL 1.4 % OP SOLN
1.0000 [drp] | Freq: Two times a day (BID) | OPHTHALMIC | Status: DC
  Administered 2012-03-27 – 2012-03-30 (×6): 1 [drp] via OPHTHALMIC
  Filled 2012-03-27: qty 15

## 2012-03-27 MED ORDER — POTASSIUM CHLORIDE 2 MEQ/ML IV SOLN
100.0000 mL/h | INTRAVENOUS | Status: DC
  Administered 2012-03-27 – 2012-03-28 (×2): 100 mL/h via INTRAVENOUS
  Filled 2012-03-27 (×10): qty 1000

## 2012-03-27 MED ORDER — DIPHENHYDRAMINE HCL 25 MG PO CAPS: 25.0000 mg | ORAL_CAPSULE | Freq: Four times a day (QID) | ORAL | Status: DC | PRN

## 2012-03-27 MED ORDER — IPRATROPIUM-ALBUTEROL 20-100 MCG/ACT IN AERS
2.0000 | INHALATION_SPRAY | Freq: Four times a day (QID) | RESPIRATORY_TRACT | Status: DC | PRN
Start: 2012-03-27 — End: 2012-03-30
  Filled 2012-03-27: qty 4

## 2012-03-27 MED ORDER — FENTANYL CITRATE 0.05 MG/ML IJ SOLN
INTRAMUSCULAR | Status: DC | PRN
  Administered 2012-03-27 (×3): 50 ug via INTRAVENOUS
  Administered 2012-03-27 (×2): 25 ug via INTRAVENOUS
  Administered 2012-03-27: 50 ug via INTRAVENOUS

## 2012-03-27 MED ORDER — ONDANSETRON HCL 4 MG/2ML IJ SOLN
INTRAMUSCULAR | Status: DC | PRN
  Administered 2012-03-27 (×4): 2 mg via INTRAVENOUS

## 2012-03-27 MED ORDER — SUCCINYLCHOLINE CHLORIDE 20 MG/ML IJ SOLN
INTRAMUSCULAR | Status: DC | PRN
  Administered 2012-03-27: 100 mg via INTRAVENOUS

## 2012-03-27 MED ORDER — ALUM & MAG HYDROXIDE-SIMETH 200-200-20 MG/5ML PO SUSP: 30.0000 mL | ORAL | Status: DC | PRN

## 2012-03-27 MED ORDER — NYSTATIN-TRIAMCINOLONE 100000-0.1 UNIT/GM-% EX CREA
1.0000 "application " | TOPICAL_CREAM | Freq: Two times a day (BID) | CUTANEOUS | Status: DC
  Administered 2012-03-27 – 2012-03-30 (×2): 1 via TOPICAL
  Filled 2012-03-27: qty 15

## 2012-03-27 MED ORDER — FLEET ENEMA 7-19 GM/118ML RE ENEM: 1.0000 | ENEMA | Freq: Once | RECTAL | Status: AC | PRN

## 2012-03-27 SURGICAL SUPPLY — 60 items
3.2 GUIDE PIN ×2 IMPLANT
BAG SPEC THK2 15X12 ZIP CLS (MISCELLANEOUS) ×1
BAG ZIPLOCK 12X15 (MISCELLANEOUS) ×2 IMPLANT
BANDAGE ELASTIC 6 VELCRO ST LF (GAUZE/BANDAGES/DRESSINGS) ×2 IMPLANT
BANDAGE ESMARK 6X9 LF (GAUZE/BANDAGES/DRESSINGS) ×1 IMPLANT
BIT DRILL 4.3MMS DISTAL GRDTED (BIT) IMPLANT
BNDG CMPR 9X6 STRL LF SNTH (GAUZE/BANDAGES/DRESSINGS) ×1
BNDG ESMARK 6X9 LF (GAUZE/BANDAGES/DRESSINGS) ×2
CLOTH BEACON ORANGE TIMEOUT ST (SAFETY) ×2 IMPLANT
CORTICAL BONE SCR 5.0MM X 48MM (Screw) ×2 IMPLANT
COVER MAYO STAND STRL (DRAPES) ×1 IMPLANT
CUFF TOURN SGL QUICK 34 (TOURNIQUET CUFF)
CUFF TRNQT CYL 34X4X40X1 (TOURNIQUET CUFF) ×1 IMPLANT
DRAPE C-ARM 42X72 X-RAY (DRAPES) ×1 IMPLANT
DRAPE POUCH INSTRU U-SHP 10X18 (DRAPES) ×2 IMPLANT
DRAPE STERI IOBAN 125X83 (DRAPES) ×2 IMPLANT
DRAPE U-SHAPE 47X51 STRL (DRAPES) ×1 IMPLANT
DRILL 4.3MMS DISTAL GRADUATED (BIT) ×2
DRSG EMULSION OIL 3X16 NADH (GAUZE/BANDAGES/DRESSINGS) ×1 IMPLANT
DRSG MEPILEX BORDER 4X12 (GAUZE/BANDAGES/DRESSINGS) ×1 IMPLANT
DRSG MEPILEX BORDER 4X8 (GAUZE/BANDAGES/DRESSINGS) ×1 IMPLANT
DRSG PAD ABDOMINAL 8X10 ST (GAUZE/BANDAGES/DRESSINGS) ×1 IMPLANT
DURAPREP 26ML APPLICATOR (WOUND CARE) ×2 IMPLANT
ELECT BLADE TIP CTD 4 INCH (ELECTRODE) ×1 IMPLANT
ELECT REM PT RETURN 9FT ADLT (ELECTROSURGICAL) ×2
ELECTRODE REM PT RTRN 9FT ADLT (ELECTROSURGICAL) ×1 IMPLANT
GLOVE BIOGEL PI IND STRL 7.5 (GLOVE) ×1 IMPLANT
GLOVE BIOGEL PI IND STRL 8 (GLOVE) ×1 IMPLANT
GLOVE BIOGEL PI INDICATOR 7.5 (GLOVE) ×1
GLOVE BIOGEL PI INDICATOR 8 (GLOVE) ×1
GLOVE ECLIPSE 8.0 STRL XLNG CF (GLOVE) ×2 IMPLANT
GLOVE ORTHO TXT STRL SZ7.5 (GLOVE) ×4 IMPLANT
GLOVE SURG SS PI 7.5 STRL IVOR (GLOVE) ×4 IMPLANT
GOWN BRE IMP PREV XXLGXLNG (GOWN DISPOSABLE) ×3 IMPLANT
GOWN STRL NON-REIN LRG LVL3 (GOWN DISPOSABLE) ×2 IMPLANT
GOWN STRL REIN XL XLG (GOWN DISPOSABLE) ×2 IMPLANT
GUIDEPIN 3.2X17.5 THRD DISP (PIN) ×2 IMPLANT
GUIDEWIRE BALL NOSE 80CM (WIRE) ×2 IMPLANT
KIT BASIN OR (CUSTOM PROCEDURE TRAY) ×2 IMPLANT
MANIFOLD NEPTUNE II (INSTRUMENTS) ×2 IMPLANT
NAIL HIP AFFIXUS 130D 13X300RT (Nail) ×1 IMPLANT
NS IRRIG 1000ML POUR BTL (IV SOLUTION) ×2 IMPLANT
PACK TOTAL JOINT (CUSTOM PROCEDURE TRAY) ×2 IMPLANT
PADDING CAST COTTON 6X4 STRL (CAST SUPPLIES) ×2 IMPLANT
POSITIONER SURGICAL ARM (MISCELLANEOUS) ×3 IMPLANT
SCREW BONE CORTICAL 5.0X44 (Screw) ×1 IMPLANT
SCREW CORTICL BON 5.0MM X 48MM (Screw) IMPLANT
SCREW LAG 10.5MMX105MM HFN (Screw) ×1 IMPLANT
SCREWDRIVER HEX TIP 3.5MM (MISCELLANEOUS) ×2 IMPLANT
SPONGE GAUZE 4X4 12PLY (GAUZE/BANDAGES/DRESSINGS) ×1 IMPLANT
SPONGE LAP 18X18 X RAY DECT (DISPOSABLE) ×4 IMPLANT
STAPLER VISISTAT 35W (STAPLE) IMPLANT
STRIP CLOSURE SKIN 1/2X4 (GAUZE/BANDAGES/DRESSINGS) ×2 IMPLANT
SUT MNCRL AB 4-0 PS2 18 (SUTURE) ×1 IMPLANT
SUT VIC AB 1 CT1 36 (SUTURE) ×6 IMPLANT
SUT VIC AB 2-0 CT1 27 (SUTURE) ×6
SUT VIC AB 2-0 CT1 TAPERPNT 27 (SUTURE) ×1 IMPLANT
TOWEL OR 17X26 10 PK STRL BLUE (TOWEL DISPOSABLE) ×4 IMPLANT
WATER STERILE IRR 1500ML POUR (IV SOLUTION) ×3 IMPLANT
YANKAUER SUCT BULB TIP 10FT TU (MISCELLANEOUS) ×1 IMPLANT

## 2012-03-27 NOTE — Progress Notes (Signed)
pacu nursing:  Neo gtt off

## 2012-03-27 NOTE — Brief Op Note (Signed)
03/27/2012  11:33 AM  PATIENT:  [name] [name]  61 y.o. female  PRE-OPERATIVE DIAGNOSIS:  Retained Hardware Right Femur  POST-OPERATIVE DIAGNOSIS:  1. Nonunion right subtrochanteric femur fracture  PROCEDURE:  Procedure(s) (LRB) with comments: 1.  HARDWARE REMOVAL (Right) 2.  Exchange intramedullary nailing of right femur, Biomet Affixus nail 13X300. nail    SURGEON:  Surgeon(s) and Role:    * Shelda Pal, MD - Primary  PHYSICIAN ASSISTANT: Lanney Gins, PA-C  ANESTHESIA:   general  EBL:  Total I/O In: 3400 [I.V.:3400] Out: 875 [Urine:450; Blood:425]  BLOOD ADMINISTERED:none  DRAINS: none   LOCAL MEDICATIONS USED:  NONE  SPECIMEN:  No Specimen  DISPOSITION OF SPECIMEN:  N/A  COUNTS:  YES  TOURNIQUET:  * No tourniquets in log *  DICTATION: .Other Dictation: Dictation Number [id number]  PLAN OF CARE: Admit to inpatient   PATIENT DISPOSITION:  PACU - hemodynamically stable.   Delay start of Pharmacological VTE agent (>24hrs) due to surgical blood loss or risk of bleeding: no

## 2012-03-27 NOTE — Anesthesia Preprocedure Evaluation (Addendum)
Anesthesia Evaluation  Patient identified by MRN, date of birth, ID band Patient awake    Reviewed: Allergy & Precautions, H&P , NPO status , Patient's Chart, lab work & pertinent test results  Airway Mallampati: I TM Distance: >3 FB Neck ROM: Full    Dental  (+) Poor Dentition, Chipped and Dental Advisory Given   Pulmonary shortness of breath, pneumonia -, resolved, COPD breath sounds clear to auscultation  Pulmonary exam normal       Cardiovascular hypertension, Pt. on medications Rhythm:Regular Rate:Normal     Neuro/Psych PSYCHIATRIC DISORDERS Depression Peripheral neuropathy  Neuromuscular disease    GI/Hepatic GERD-  Medicated,  Endo/Other  diabetes, Well Controlled, Type obesity  Renal/GU      Musculoskeletal   Abdominal (+) + obese,   Peds  Hematology   Anesthesia Other Findings   Reproductive/Obstetrics                          Anesthesia Physical Anesthesia Plan  ASA: III  Anesthesia Plan: General   Post-op Pain Management:    Induction: Intravenous  Airway Management Planned: Oral ETT  Additional Equipment:   Intra-op Plan:   Post-operative Plan: Extubation in OR  Informed Consent: I have reviewed the patients History and Physical, chart, labs and discussed the procedure including the risks, benefits and alternatives for the proposed anesthesia with the patient or authorized representative who has indicated his/her understanding and acceptance.   Dental advisory given  Plan Discussed with: CRNA  Anesthesia Plan Comments:        Anesthesia Quick Evaluation

## 2012-03-27 NOTE — Progress Notes (Signed)
pacu nursing:  Pt on neo gtt due to low bp upon arrival to pacu.  Vitals changed to q3 minutes

## 2012-03-27 NOTE — Anesthesia Postprocedure Evaluation (Signed)
Anesthesia Post Note  Patient: [name] [name]  Procedure(s) Performed: Procedure(s) (LRB): HARDWARE REMOVAL (Right) INTRAMEDULLARY (IM) NAIL INTERTROCHANTRIC (Right)  Anesthesia type: General  Patient location: PACU  Post pain: Pain level controlled  Post assessment: Post-op Vital signs reviewed  Last Vitals: BP 113/78  Pulse 95  Temp 36.4 C (Oral)  Resp 10  SpO2 100%  Post vital signs: Reviewed  Level of consciousness: sedated  Complications: No apparent anesthesia complications

## 2012-03-27 NOTE — Transfer of Care (Signed)
Immediate Anesthesia Transfer of Care Note  Patient: [name] [name]  Procedure(s) Performed: Procedure(s) (LRB) with comments: HARDWARE REMOVAL (Right) INTRAMEDULLARY (IM) NAIL INTERTROCHANTRIC (Right) - Possible Exchange with a Shorter Trochantric Nail  Patient Location: PACU  Anesthesia Type:General  Level of Consciousness: awake, patient cooperative, lethargic and responds to stimulation  Airway & Oxygen Therapy: Patient Spontanous Breathing and Patient connected to face mask oxygen  Post-op Assessment: Report given to PACU RN, Post -op Vital signs reviewed and stable and Patient moving all extremities  Post vital signs: Reviewed and stable  Complications: No apparent anesthesia complications

## 2012-03-27 NOTE — Interval H&P Note (Signed)
History and Physical Interval Note:  03/27/2012 7:02 AM  [name] [name]  has presented today for surgery, with the diagnosis of Retained Hardware Right Femur  The various methods of treatment have been discussed with the patient and family. After consideration of risks, benefits and other options for treatment, the patient has consented to  Procedure(s) (LRB) with comments: HARDWARE REMOVAL (Right) INTRAMEDULLARY (IM) NAIL INTERTROCHANTRIC (Right) - Possible Exchange with a Shorter Trochantric Nail as a surgical intervention .  The patient's history has been reviewed, patient examined, no change in status, stable for surgery.  I have reviewed the patient's chart and labs.  Questions were answered to the patient's satisfaction.     Shelda Pal

## 2012-03-27 NOTE — Preoperative (Signed)
Beta Blockers   Reason not to administer Beta Blockers:Not Applicable 

## 2012-03-27 NOTE — Progress Notes (Signed)
Clinical Social Work Department BRIEF PSYCHOSOCIAL ASSESSMENT 03/27/2012  Patient:  [name] [name], [name] [name]     Account Number:  [id number]     Admit date:  03/27/2012  Clinical Social Worker:  Sofie Hartigan, MSW  Date/Time:  03/27/2012 04:00 PM  Referred by:  Physician  Date Referred:  03/27/2012 Referred for  SNF Placement   Other Referral:   Interview type:  Patient Other interview type:    PSYCHOSOCIAL DATA Living Status:  ALONE Admitted from facility:   Level of care:   Primary support name:  Lucendia Herrlich Primary support relationship to patient:  SIBLING Degree of support available:   unclear    CURRENT CONCERNS Current Concerns  Post-Acute Placement   Other Concerns:    SOCIAL WORK ASSESSMENT / PLAN Pt is a 61 yr old female living at home prior to hospitalization. CSW met with pt to assist with d/c planning . Pt has made prior arrangements to have [facility] Rehab at [facility] following hospital d/c. SNF contacted and D/C plan has been confirmed. CSW will follow to assist with d/c planning to SNF.   Assessment/plan status:  Psychosocial Support/Ongoing Assessment of Needs Other assessment/ plan:   Information/referral to community resources:   None needed at this time.    PATIENT'S/FAMILY'S RESPONSE TO PLAN OF CARE: Pt is looking forward to rehab at [facility].    Cori Razor LCSW [phone]

## 2012-03-27 NOTE — Progress Notes (Signed)
pacu nursing:  Pt arousable, but remains extremely sleepy.  bp 96/55 rr 4-7.  Will continue to monitor.  Dr germeroth aware

## 2012-03-28 ENCOUNTER — Encounter (HOSPITAL_COMMUNITY): Payer: Self-pay | Admitting: Orthopedic Surgery

## 2012-03-28 DIAGNOSIS — Z969 Presence of functional implant, unspecified: Secondary | ICD-10-CM

## 2012-03-28 DIAGNOSIS — Z6841 Body Mass Index (BMI) 40.0 and over, adult: Secondary | ICD-10-CM

## 2012-03-28 DIAGNOSIS — D62 Acute posthemorrhagic anemia: Secondary | ICD-10-CM

## 2012-03-28 DIAGNOSIS — E871 Hypo-osmolality and hyponatremia: Secondary | ICD-10-CM

## 2012-03-28 LAB — PREPARE RBC (CROSSMATCH)

## 2012-03-28 LAB — CBC
Platelets: 295 10*3/uL (ref 150–400)
RBC: 2.68 MIL/uL — ABNORMAL LOW (ref 3.87–5.11)
WBC: 8.7 10*3/uL (ref 4.0–10.5)

## 2012-03-28 LAB — GLUCOSE, CAPILLARY
Glucose-Capillary: 120 mg/dL — ABNORMAL HIGH (ref 70–99)
Glucose-Capillary: 194 mg/dL — ABNORMAL HIGH (ref 70–99)

## 2012-03-28 LAB — BASIC METABOLIC PANEL
CO2: 28 mEq/L (ref 19–32)
Chloride: 100 mEq/L (ref 96–112)
Potassium: 4.2 mEq/L (ref 3.5–5.1)
Sodium: 133 mEq/L — ABNORMAL LOW (ref 135–145)

## 2012-03-28 MED ORDER — FUROSEMIDE 10 MG/ML IJ SOLN
10.0000 mg | Freq: Once | INTRAMUSCULAR | Status: AC
  Administered 2012-03-28: 10 mg via INTRAVENOUS
  Filled 2012-03-28: qty 1

## 2012-03-28 MED ORDER — INSULIN ASPART 100 UNIT/ML ~~LOC~~ SOLN
0.0000 [IU] | Freq: Three times a day (TID) | SUBCUTANEOUS | Status: DC
  Administered 2012-03-28 – 2012-03-30 (×6): 1 [IU] via SUBCUTANEOUS

## 2012-03-28 NOTE — Progress Notes (Signed)
Clinical Social Work Department CLINICAL SOCIAL WORK PLACEMENT NOTE 03/28/2012  Patient:  [name], [name]  Account Number:  [id number] Admit date:  03/27/2012  Clinical Social Worker:  Cori Razor, LCSW  Date/time:  03/28/2012 03:05 PM  Clinical Social Work is seeking post-discharge placement for this patient at the following level of care:   SKILLED NURSING   (*CSW will update this form in Epic as items are completed)     Patient/family provided with [facility] Department of Clinical Social Work's list of facilities offering this level of care within the geographic area requested by the patient (or if unable, by the patient's family).    Patient/family informed of their freedom to choose among providers that offer the needed level of care, that participate in Medicare, Medicaid or managed care program needed by the patient, have an available bed and are willing to accept the patient.    Patient/family informed of [facility]' ownership interest in [facility], as well as of the fact that they are under no obligation to receive care at this facility.  PASARR submitted to EDS on 03/28/2012 PASARR number received from [employer] on 03/28/2012  FL2 transmitted to all facilities in geographic area requested by pt/family on  03/28/2012 FL2 transmitted to all facilities within larger geographic area on   Patient informed that his/her managed care company has contracts with or will negotiate with  certain facilities, including the following:     Patient/family informed of bed offers received:  03/28/2012 Patient chooses bed at [facility] Physician recommends and patient chooses bed at    Patient to be transferred to  on   Patient to be transferred to facility by   The following physician request were entered in Epic:   Additional Comments:  Cori Razor LCSW [phone]

## 2012-03-28 NOTE — Progress Notes (Addendum)
   Subjective: 1 Day Post-Op Procedure(s) (LRB): HARDWARE REMOVAL (Right) INTRAMEDULLARY (IM) NAIL INTERTROCHANTRIC (Right)   Patient reports pain as mild, expressing minimal pain. She is quite out of it, but after waking up a little she states that she always has a hard time waking up. No events throughout the night. Nurse states that she wasn't out of it yesterday with the pain medication.  Objective:   VITALS:   Filed Vitals:   03/28/12 0608  BP: 117/73  Pulse: 98  Temp: 99.5 F (37.5 C)  Resp: 14    Neurovascular intact Dorsiflexion/Plantar flexion intact Incision: dressing C/D/I No cellulitis present Compartment soft  LABS  Basename 03/28/12 0458 03/27/12 1012  HGB 6.8* 9.2*  HCT 21.6* 27.0*  WBC 8.7 --  PLT 295 --     Basename 03/28/12 0458 03/27/12 1012  NA 133* 137  K 4.2 4.4  BUN 14 --  CREATININE 1.01 --  GLUCOSE 118* 159*     Assessment/Plan: 1 Day Post-Op Procedure(s) (LRB): HARDWARE REMOVAL (Right) INTRAMEDULLARY (IM) NAIL INTERTROCHANTRIC (Right) Foley cath d/c'ed 25-50% WB of the left leg Advance diet Up with therapy D/C IV fluids Discharge to SNF eventually when ready  ABLA  Treated with 2 units of blood and will check with lab work tomorrow.   Morbid Obesity (BMI >40)  Estimated Body mass index is 47.92 kg/(m^2) as calculated from the following:   Height as of this encounter: 5\' 2" (1.575 m).   Weight as of this encounter: 262 lb(118.842 kg). Patient also counseled that weight may inhibit the healing process Patient counseled that losing weight will help with future health issues  Hyponatremia Treated with IV fluids and will observe     Anastasio Auerbach. Rucker Pridgeon   PAC  03/28/2012, 7:41 AM

## 2012-03-28 NOTE — Progress Notes (Signed)
PT Cancellation Note  Patient Details Name: [name] [name] MRN: [id number] DOB: 13-Jun-1950   Cancelled Treatment:     Order received. Chart reviewed. Noted pt's hgb 6.8. Pt scheduled to receive 2 units of blood. Will hold PT this am until after transfusion. Will check back later today to attempt PT eval.   [name] [facility] 03/28/2012, 8:38 AM [phone]

## 2012-03-28 NOTE — Progress Notes (Signed)
OT Cancellation Note  Patient Details Name: [name] [name] MRN: [id number] DOB: November 18, 1950   Cancelled Treatment:    Reason Eval/Treat Not Completed: Medical issues which prohibited therapy (hgb 6.8- receiving blood)  Cyrstal Leitz A OTR/L [phone] 03/28/2012, 10:51 AM

## 2012-03-28 NOTE — Op Note (Signed)
NAME:  [name] [name], [name] [name]                 ACCOUNT NO.:  [id number]  MEDICAL RECORD NO.:  [id number]  LOCATION:  1617                         FACILITY:  [facility]  PHYSICIAN:  Madlyn Frankel. Charlann Boxer, M.D.  DATE OF BIRTH:  03-20-1951  DATE OF PROCEDURE:  03/27/2012 DATE OF DISCHARGE:                              OPERATIVE REPORT   PREOPERATIVE DIAGNOSIS:  Retained right femoral nail with plan to exchange the femoral nail to a shorter nail in preparation of planned right total knee replacement on April 24, 2012.  POSTOPERATIVE DIAGNOSIS:  Right subtrochanteric femur, nonunion.  PROCEDURE: 1. Removal of deep implants including intramedullary nail and     associated screws. 2. Exchange of intramedullary nailing with a 13 x 300 mm AFFIXUS nail     with a single lag screw and a distal interlock in the dynamic     position to allow for some compression across her fracture site.  SURGEON:  Madlyn Frankel. Charlann Boxer, M.D.  ASSISTANT:  Lanney Gins, PA-C  Please note that [name] [name] was present for the entirety of the [name] [name] and critical for management of the operative extremity due to the sizes for extremity with retractors as well as facilitation of removal of hardware and re-instrumentation and exchange nailing, as well as general facilitation, placement of primary wound closure at the end.  ANESTHESIA:  General.  SPECIMENS:  None.  COMPLICATION:  None apparent.  BLOOD LOSS:  About 500 mL.  DRAINS:  None.  INDICATION FOR PROCEDURE:  [name] [name] is a 61 year old female with history of right subtrochanteric femur fracture.  We watched her for greater than the year and a half period of time trying to make certain that she had adequate fracture healing.  She just developing right knee pain associated with her right knee osteoarthritis and was ready to proceed with knee replacement.  Due to the uncertainty of the fracture union as well as the location and her size, I felt that the best course of action was  to exchange this nail to a shorter nail to be supervise and fixation across the fracture site despite an abundant amount of callus formation prior to proceeding with knee replacement.  The other alternatives were to proceed with a computer navigated knee replacement. After reviewing risks and benefits, the pros and cons, she was scheduled for nail removal and exchange followed by total knee replacement on April 24, 2012.  After reviewing these risks and benefits, consent was obtained for benefit above.  PROCEDURE IN DETAIL:  The patient was brought to the operative theater. Once adequate anesthesia, preoperative antibiotics, Ancef administered, she was positioned supine on the fracture table.  Once positioned adequately given her size, removing soft tissues out of the way.  The right hip was then prepped and draped in a sterile fashion using a shower curtain technique.  Time-out was performed identifying the patient, planned procedure, and extremity.  At this point under fluoroscopic imaging, we identified that the distal screw had broken which I was aware of this from the office and the long- term followup of her fracture.  I did spend some time, probably good 20 minutes at this point trying to remove the  lateral screw portion.  After period of time, I was unable to identify the screw and thus packed this area often attended to the proximal femur.  This distal screw would not interfere with nail removal.  Initially, I had made a standard proximal based incision proximal to the trochanter under fluoroscopic guidance and ended up having to extend this down to the midportion of her femur through the old incision.  She had a significant amount of adipose tissue over the lateral side of her hip down to the gluteal fascia and iliotibial band.  After attempts under fluoroscopic guidance, I was able to get a guidewire into the proximal portion of the nail.  I was able to get it to the  most proximal based screw.  At this point, I went ahead and through this lateral incision, I was able to identify the two proximal lag screws and removed one screw, which was loose within the proximal femur.  I was then able to tap the guidepin into the proximal femur a bit more, and then with this in place, used a conical extraction device and was able to pass this into the proximal femoral nail.  With this in place, I removed the second of the two proximal lag screws, this having to be removed via the screwdriver.  With these two screws were removed, the nail was then backed out without complication.  At this point, I was planned to again exchange her out for a smaller AFFIXUS type nail; however, when we looked at the subtrochanteric region, the fracture had evidence of a nonunion.  There was mobility at the fracture site.  This obviously changed the entire complexion of the plan.  We then passed a ball-tipped guidewire past the fracture site to the knee and reamed from a 10-mm reamer up to a 14-mm reamer.  I did take a 15-mm reamer and passed it across the fracture site in order to increase reaming at this area to try to stimulate further healing.  I selected a 13 x 300-mm nail and then passed this by hand to an appropriate depth.  The proximal lag screw for the AFFIXUS nail was then positioned after passing guidewire into the center of the femoral head in AP and lateral planes, drilling and choosing a 105-mm lag screw.  I then locked this down with a fixed angle device.  Distally under perfect circle technique, I was able to place a single screw and I placed this into the dynamic slot to allow for further compression across her subtrochanteric fracture site.  Also under fluoroscopic imaging, I identified the location of this broken screw distally and I was able to eventually removed this without complication.  At this point, all wounds were irrigated copiously with normal  saline solution.  Final radiographs were obtained in all planes and sent to the Radiology for review.  At this point, I reapproximated the gluteal fascia and iliotibial band with #1 Vicryl.  We reapproximated the subcutaneous tissue using 2-0 Vicryl and staples on the skin, subcu stitches distally were used, 2-0 and staples.  The knee was then cleaned and dressed with Mepilex and ABD for compression and an Ace wrap.  The proximal wound was closed with just Mepilex.  She was brought to the recovery room in stable condition.  She was remained in the hospital for 2-3 days with the plan to stay at a nursing facility for rehab.  We will keep her limited 25-50% weightbearing for probably 6-8 weeks until we  can further identify fracture healing with regards to clinical presentation and radiographic findings.  At this point, her knee replaced and we will need to be put on hold.  We will cancel the [place] for April 24, 2012, at this point and proceeded to a later date.  This will all be reviewed with the patient in the perioperative period and then I marked the [place].     Madlyn Frankel Charlann Boxer, M.D.     MDO/MEDQ  D:  03/27/2012  T:  03/28/2012  Job:  [id number]

## 2012-03-28 NOTE — Evaluation (Signed)
Physical Therapy Evaluation Patient Details Name: [name] [name] MRN: [id number] DOB: 1950/06/14 Today's Date: 03/28/2012 Time: 4098-1191 PT Time Calculation (min): 21 min  PT Assessment / Plan / Recommendation Clinical Impression  61 yo female s/p hardware removal and exchange of IM rod/nail for R femur. Low Hgb-receiving 2 units of blood. Limited evaluaiton due to lightheadedness, drowsiness, and pt still awaiting 2nd unit of blood. +2 assist for bed mobility. Recommend SNF for continued rehab.     PT Assessment  Patient needs continued PT services    Follow Up Recommendations  SNF    Does the patient have the potential to tolerate intense rehabilitation      Barriers to Discharge        Equipment Recommendations  None recommended by PT    Recommendations for Other Services OT consult   Frequency Min 3X/week    Precautions / Restrictions Precautions Precautions: Fall Restrictions Weight Bearing Restrictions: Yes RLE Weight Bearing: Partial weight bearing RLE Partial Weight Bearing Percentage or Pounds: 25-50   Pertinent Vitals/Pain R LE-pt denied pain      Mobility  Bed Mobility Bed Mobility: Supine to Sit;Sit to Supine Supine to Sit: 1: +2 Total assist Supine to Sit: Patient Percentage: 40% Sit to Supine: 1: +2 Total assist Sit to Supine: Patient Percentage: 40% Details for Bed Mobility Assistance: Multimodal cues for safety, technique, hand placement. Assist for R LE off bed and trunk to upright/supine. Utilized bedpad to assist with scooting, positioning.  Transfers Details for Transfer Assistance: Deferred today due to pt was lightheaded and drowsy. Also awaiting 2nd unit of blood.  Ambulation/Gait Ambulation/Gait Assistance: Not tested (comment)    Shoulder Instructions     Exercises     PT Diagnosis: Difficulty walking;Generalized weakness;Acute pain  PT Problem List: Decreased strength;Decreased range of motion;Decreased activity tolerance;Decreased  balance;Decreased mobility;Pain;Decreased knowledge of use of DME;Obesity PT Treatment Interventions: DME instruction;Gait training;Functional mobility training;Therapeutic activities;Balance training;Therapeutic exercise;Patient/family education   PT Goals Acute Rehab PT Goals PT Goal Formulation: With patient Time For Goal Achievement: 04/04/12 Potential to Achieve Goals: Good Pt will go Supine/Side to Sit: with mod assist PT Goal: Supine/Side to Sit - Progress: Goal set today Pt will go Sit to Supine/Side: with mod assist PT Goal: Sit to Supine/Side - Progress: Goal set today Pt will go Sit to Stand: with mod assist PT Goal: Sit to Stand - Progress: Goal set today Pt will Transfer Bed to Chair/Chair to Bed: with mod assist PT Transfer Goal: Bed to Chair/Chair to Bed - Progress: Goal set today Pt will Ambulate: 51 - 150 feet;with mod assist;with rolling walker PT Goal: Ambulate - Progress: Goal set today  Visit Information  Last PT Received On: 03/28/12 Assistance Needed: +2    Subjective Data  Subjective: "I had a pill" Patient Stated Goal: Get better   Prior Functioning  Home Living Lives With: Alone Home Adaptive Equipment: Walker - rolling;Tub transfer bench;Wheelchair - manual Prior Function Level of Independence: Independent with assistive device(s) Driving: No Communication Communication: No difficulties    Cognition  Overall Cognitive Status: Appears within functional limits for tasks assessed/performed Arousal/Alertness: Awake/alert Orientation Level: Appears intact for tasks assessed Behavior During Session: [facility] for tasks performed    Extremity/Trunk Assessment Right Lower Extremity Assessment RLE ROM/Strength/Tone: Deficits RLE ROM/Strength/Tone Deficits: Limited DF/PF. Hip abd/add 2/5.  Left Lower Extremity Assessment LLE ROM/Strength/Tone: [facility] for tasks assessed   Balance Balance Balance Assessed: Yes Static Sitting Balance Static Sitting - Comment/#  of Minutes: Sat EOB  5-7 minutes with Min Assist. Increased anteior-posterior swaying. Posterior leaning.   End of Session PT - End of Session Activity Tolerance:  (Limited by drowsiness/lightheadedness) Patient left: in bed;with call bell/phone within reach  GP     [name] [facility] 03/28/2012, 2:37 PM [phone]

## 2012-03-28 NOTE — Progress Notes (Signed)
Utilization review completed.  

## 2012-03-29 LAB — TYPE AND SCREEN
ABO/RH(D): A NEG
Antibody Screen: NEGATIVE
Unit division: 0
Unit division: 0

## 2012-03-29 LAB — GLUCOSE, CAPILLARY
Glucose-Capillary: 134 mg/dL — ABNORMAL HIGH (ref 70–99)
Glucose-Capillary: 122 mg/dL — ABNORMAL HIGH (ref 70–99)

## 2012-03-29 LAB — BASIC METABOLIC PANEL
Calcium: 8.8 mg/dL (ref 8.4–10.5)
Creatinine, Ser: 1.09 mg/dL (ref 0.50–1.10)
GFR calc non Af Amer: 53 mL/min — ABNORMAL LOW (ref >90)
Glucose, Bld: 145 mg/dL — ABNORMAL HIGH (ref 70–99)
Sodium: 135 mEq/L (ref 135–145)

## 2012-03-29 LAB — CBC
MCH: 26.2 pg (ref 26.0–34.0)
MCHC: 32.3 g/dL (ref 30.0–36.0)
Platelets: 302 10*3/uL (ref 150–400)

## 2012-03-29 NOTE — Care Management Note (Signed)
    Page 1 of 2   03/29/2012     5:50:07 PM   CARE MANAGEMENT NOTE 03/29/2012  Patient:  [name] [name], [name] [name]   Account Number:  [id number]  Date Initiated:  03/29/2012  Documentation initiated by:  Colleen Can  Subjective/Objective Assessment:   dx retained hardware rt femur; removal of hardware     Action/Plan:   Cm spoke with patient. Pt plans to go to SNF for rehab   Anticipated DC Date:  03/30/2012   Anticipated DC Plan:  SKILLED NURSING FACILITY  In-house referral  Clinical Social Worker      DC Planning Services  CM consult      [facility] Choice  NA   Choice offered to / List presented to:  NA   DME arranged  NA      DME agency  NA     HH arranged  NA      HH agency  NA   Status of service:  Completed, signed off Medicare Important Message given?  NA - LOS <3 / Initial given by admissions (If response is "NO", the following Medicare IM given date fields will be blank) Date Medicare IM given:   Date Additional Medicare IM given:    Discharge Disposition:    Per UR Regulation:    If discussed at Long Length of Stay Meetings, dates discussed:    Comments:

## 2012-03-29 NOTE — Evaluation (Signed)
Occupational Therapy Evaluation Patient Details Name: [name] [name] MRN: [id number] DOB: 1951/02/01 Today's Date: 03/29/2012 Time: 2440-1027 OT Time Calculation (min): 16 min  OT Assessment / Plan / Recommendation Clinical Impression  61 yo female s/p hardware removal and exchange of IM rod/nail for R femur. Skilled OT indicated to maximize ADL independence to min A level in prep for d/c to next venue of care.    OT Assessment  Patient needs continued OT Services    Follow Up Recommendations  SNF    Barriers to Discharge Decreased caregiver support    Equipment Recommendations  3 in 1 bedside comode    Recommendations for Other Services    Frequency  Min 1X/week    Precautions / Restrictions Precautions Precautions: Fall Restrictions Weight Bearing Restrictions: Yes RLE Weight Bearing: Partial weight bearing RLE Partial Weight Bearing Percentage or Pounds: 25-50   Pertinent Vitals/Pain Pt reported 3/10 pain in R hip following transfer back to bed. Repositioned for comfort.    ADL  Grooming: Simulated;Set up Where Assessed - Grooming: Unsupported sitting Lower Body Bathing: Simulated;Maximal assistance Where Assessed - Lower Body Bathing: Supported sit to stand Lower Body Dressing: Simulated;Maximal assistance Where Assessed - Lower Body Dressing: Supported sit to stand Toilet Transfer: Simulated;+2 Total assistance Toilet Transfer: Patient Percentage: 60% Statistician Method: Surveyor, minerals: Other (comment) (back to bed) Toileting - Clothing Manipulation and Hygiene: Simulated;Maximal assistance Where Assessed - Engineer, mining and Hygiene: Standing Equipment Used: Rolling walker;Gait belt ADL Comments: Pt only agreeablet to BTB with RW. Fatigues quickly.    OT Diagnosis: Generalized weakness  OT Problem List: Decreased activity tolerance;Pain OT Treatment Interventions: Self-care/ADL training;Therapeutic activities;DME  and/or AE instruction;Patient/family education   OT Goals Acute Rehab OT Goals OT Goal Formulation: With patient Time For Goal Achievement: 04/05/12 Potential to Achieve Goals: Good ADL Goals Pt Will Perform Grooming: Standing at sink;Other (comment) (with minguard A) ADL Goal: Grooming - Progress: Goal set today Pt Will Perform Lower Body Bathing: with min assist;Sit to stand from chair;Sit to stand from bed;with adaptive equipment ADL Goal: Lower Body Bathing - Progress: Goal set today Pt Will Perform Lower Body Dressing: with min assist;Sit to stand from chair;Sit to stand from bed ADL Goal: Lower Body Dressing - Progress: Goal set today Pt Will Transfer to Toilet: with min assist;3-in-1;Ambulation;Stand pivot transfer ADL Goal: Toilet Transfer - Progress: Goal set today Pt Will Perform Toileting - Clothing Manipulation: with min assist;Sitting on 3-in-1 or toilet;Standing ADL Goal: Toileting - Clothing Manipulation - Progress: Goal set today Pt Will Perform Toileting - Hygiene: with min assist;Sit to stand from 3-in-1/toilet ADL Goal: Toileting - Hygiene - Progress: Goal set today  Visit Information  Last OT Received On: 03/29/12 Assistance Needed: +2 PT/OT Co-Evaluation/Treatment: Yes    Subjective Data  Subjective: This will be my 4th rehab admission in 18 months. Patient Stated Goal: Eventually have her knee replaced.   Prior Functioning     Home Living Lives With: Alone Available Help at Discharge: Skilled Nursing Facility Home Adaptive Equipment: Tub transfer bench;Walker - rolling;Wheelchair - manual Prior Function Level of Independence: Independent with assistive device(s) Driving: No Communication Communication: No difficulties Dominant Hand: Right         Vision/Perception     Cognition  Overall Cognitive Status: Appears within functional limits for tasks assessed/performed Arousal/Alertness: Awake/alert Orientation Level: Appears intact for tasks  assessed Behavior During Session: [facility] for tasks performed    Extremity/Trunk Assessment Right Upper Extremity Assessment RUE  ROM/Strength/Tone: [facility] for tasks assessed Left Upper Extremity Assessment LUE ROM/Strength/Tone: [facility] for tasks assessed     Mobility Bed Mobility Bed Mobility: Sit to Supine Sit to Supine: 1: +2 Total assist Sit to Supine: Patient Percentage: 60% Details for Bed Mobility Assistance: Assist for trunk to supine and bil LEs onto bed.  Transfers Sit to Stand: 1: +2 Total assist;From chair/3-in-1 Sit to Stand: Patient Percentage: 60% Stand to Sit: 1: +2 Total assist;To bed Stand to Sit: Patient Percentage: 60% Details for Transfer Assistance: Pt still fearful of putting weight through R LE-utilized TDWB for stand pivot from recliner to bed.      Shoulder Instructions     Exercise     Balance     End of Session OT - End of Session Equipment Utilized During Treatment: Gait belt Activity Tolerance: Patient limited by fatigue Patient left: in bed;with call bell/phone within reach  GO     Sandeep Radell A OTR/L [phone] 03/29/2012, 3:23 PM

## 2012-03-29 NOTE — Progress Notes (Signed)
Physical Therapy Treatment Patient Details Name: [name] [name] MRN: [id number] DOB: 1950/07/09 Today's Date: 03/29/2012 Time: 7829-5621 PT Time Calculation (min): 17 min  PT Assessment / Plan / Recommendation Comments on Treatment Session  Tolerated pivot to chair well. Recommend SNF for continued rehab.     Follow Up Recommendations  SNF     Does the patient have the potential to tolerate intense rehabilitation     Barriers to Discharge        Equipment Recommendations  None recommended by PT    Recommendations for Other Services OT consult  Frequency Min 3X/week   Plan Discharge plan remains appropriate    Precautions / Restrictions Precautions Precautions: Fall Restrictions Weight Bearing Restrictions: Yes RLE Weight Bearing: Partial weight bearing RLE Partial Weight Bearing Percentage or Pounds: 25-50   Pertinent Vitals/Pain R Le with activity-unrated    Mobility  Bed Mobility Bed Mobility: Supine to Sit Supine to Sit: 1: +2 Total assist Supine to Sit: Patient Percentage: 60% Details for Bed Mobility Assistance: Vcs cues for safety, hand placment. Assist for trunk to upright and R LE off bed. Utilized bed pad to assist with scooting, positioning.  Transfers Transfers: Sit to Stand;Stand to Sit;Stand Pivot Transfers Sit to Stand: 1: +2 Total assist;From bed Sit to Stand: Patient Percentage: 60% Stand to Sit: 1: +2 Total assist;To chair/3-in-1 Stand to Sit: Patient Percentage: 60% Stand Pivot Transfers: 1: +2 Total assist Stand Pivot Transfers: Patient Percentage: 70% Details for Transfer Assistance: VCs safety, technique, hand placement. Assist to rise, stabilize, control descent, manuever with RW. Pt fearful of putting weight through R LE-utilized TDWB to 25%.     Exercises     PT Diagnosis:    PT Problem List:   PT Treatment Interventions:     PT Goals Acute Rehab PT Goals Pt will go Supine/Side to Sit: with mod assist PT Goal: Supine/Side to Sit -  Progress: Progressing toward goal Pt will go Sit to Stand: with mod assist PT Goal: Sit to Stand - Progress: Progressing toward goal Pt will Transfer Bed to Chair/Chair to Bed: with mod assist PT Transfer Goal: Bed to Chair/Chair to Bed - Progress: Progressing toward goal  Visit Information  Last PT Received On: 03/29/12 Assistance Needed: +2    Subjective Data  Subjective: "He told me not to put any weight on it this morning." Patient Stated Goal: get better   Cognition  Overall Cognitive Status: Appears within functional limits for tasks assessed/performed Arousal/Alertness: Awake/alert Orientation Level: Appears intact for tasks assessed Behavior During Session: [facility] for tasks performed    Balance     End of Session PT - End of Session Activity Tolerance: Patient tolerated treatment well Patient left: in chair;with call bell/phone within reach   GP     [name] [facility] 03/29/2012, 10:47 AM [phone]

## 2012-03-29 NOTE — Progress Notes (Signed)
Physical Therapy Treatment Patient Details Name: [name] [name] MRN: [id number] DOB: [birth year] Today's Date: 03/29/2012 Time: 2130-8657 PT Time Calculation (min): 20 min  PT Assessment / Plan / Recommendation Comments on Treatment Session  Pt reports MD told her that old fracture had not healed (on this morning). Pt still fearful of putting weight through R LE for this reason. Pt tolerated pivot well. Deferred ambulation due to pt's concern.     Follow Up Recommendations  SNF     Does the patient have the potential to tolerate intense rehabilitation     Barriers to Discharge        Equipment Recommendations  None recommended by PT    Recommendations for Other Services OT consult  Frequency Min 3X/week   Plan Discharge plan remains appropriate    Precautions / Restrictions Precautions Precautions: Fall Restrictions Weight Bearing Restrictions: Yes RLE Weight Bearing: Partial weight bearing RLE Partial Weight Bearing Percentage or Pounds: 25-50   Pertinent Vitals/Pain 3/10 R LE- from ankle up to hip    Mobility  Bed Mobility Bed Mobility: Sit to Supine Sit to Supine: 1: +2 Total assist Sit to Supine: Patient Percentage: 60% Details for Bed Mobility Assistance: Assist for trunk to supine and bil LEs onto bed.  Transfers Transfers: Sit to Stand;Stand to Sit Sit to Stand: 1: +2 Total assist;From chair/3-in-1 Sit to Stand: Patient Percentage: 60% Stand to Sit: 1: +2 Total assist;To bed Stand to Sit: Patient Percentage: 60% Stand Pivot Transfers: 1: +2 Total assist Stand Pivot Transfers: Patient Percentage: 70% Details for Transfer Assistance: Pt still fearful of putting weight through R LE-utilized TDWB for stand pivot from recliner to bed.  Ambulation/Gait Ambulation/Gait Assistance: 1: +2 Total assist Assistive device: Rolling walker Ambulation/Gait Assistance Details: 3 side steps towards bed from chair.  Gait Pattern: Step-to pattern;Antalgic;Decreased stance time  - right;Right flexed knee in stance    Exercises     PT Diagnosis:    PT Problem List:   PT Treatment Interventions:     PT Goals Acute Rehab PT Goals Pt will go Sit to Supine/Side: with mod assist PT Goal: Sit to Supine/Side - Progress: Progressing toward goal Pt will go Sit to Stand: with mod assist PT Goal: Sit to Stand - Progress: Progressing toward goal Pt will Transfer Bed to Chair/Chair to Bed: with mod assist PT Transfer Goal: Bed to Chair/Chair to Bed - Progress: Progressing toward goal  Visit Information  Last PT Received On: 03/29/12 Assistance Needed: +2    Subjective Data  Subjective: "He told me that the fracture hadn't healed yet." Patient Stated Goal: Get better and get the knee replacement   Cognition  Overall Cognitive Status: Appears within functional limits for tasks assessed/performed Arousal/Alertness: Awake/alert Orientation Level: Appears intact for tasks assessed Behavior During Session: [facility] for tasks performed    Balance     End of Session PT - End of Session Activity Tolerance: Patient tolerated treatment well Patient left: in bed;with call bell/phone within reach   GP     [name] [facility] 03/29/2012, 3:15 PM [phone]

## 2012-03-29 NOTE — Progress Notes (Signed)
   Subjective: 2 Days Post-Op Procedure(s) (LRB): HARDWARE REMOVAL (Right) INTRAMEDULLARY (IM) NAIL INTERTROCHANTRIC (Right)   Patient reports pain as mild, pain well controlled. No events throughout the night. Feels better after receiving blood yesterday.  Objective:   VITALS:   Filed Vitals:   03/29/12 0500  BP: 135/76  Pulse: 100  Temp: 97.4 F (36.3 C)  Resp: 16    Neurovascular intact Dorsiflexion/Plantar flexion intact Incision: dressing C/D/I No cellulitis present Compartment soft  LABS  Basename 03/29/12 0435 03/28/12 0458 03/27/12 1012  HGB 9.1* 6.8* 9.2*  HCT 28.2* 21.6* 27.0*  WBC 11.4* 8.7 --  PLT 302 295 --     Basename 03/29/12 0435 03/28/12 0458 03/27/12 1012  NA 135 133* 137  K 4.3 4.2 4.4  BUN 12 14 --  CREATININE 1.09 1.01 --  GLUCOSE 145* 118* 159*     Assessment/Plan: 2 Days Post-Op Procedure(s) (LRB): HARDWARE REMOVAL (Right) INTRAMEDULLARY (IM) NAIL INTERTROCHANTRIC (Right) Up with therapy Discharge to SNF when ready, possibly Thursday  ABLA  Treated with 2 units of blood yesterday, has stabilized and will treat with iron and observe.   Morbid Obesity (BMI >40)  Estimated Body mass index is 47.92 kg/(m^2) as calculated from the following:  Height as of this encounter: 5\' 2" (1.575 m).  Weight as of this encounter: 262 lb(118.842 kg).  Patient also counseled that weight may inhibit the healing process  Patient counseled that losing weight will help with future health issues    Anastasio Auerbach. Solmon Bohr   PAC  03/29/2012, 9:22 AM

## 2012-03-30 LAB — GLUCOSE, CAPILLARY
Glucose-Capillary: 115 mg/dL — ABNORMAL HIGH (ref 70–99)
Glucose-Capillary: 126 mg/dL — ABNORMAL HIGH (ref 70–99)

## 2012-03-30 MED ORDER — DIPHENHYDRAMINE HCL 25 MG PO CAPS: 25.0000 mg | ORAL_CAPSULE | Freq: Four times a day (QID) | ORAL | Status: DC | PRN

## 2012-03-30 MED ORDER — FERROUS SULFATE 325 (65 FE) MG PO TABS: 325.0000 mg | ORAL_TABLET | Freq: Three times a day (TID) | ORAL | Status: DC

## 2012-03-30 MED ORDER — ASPIRIN EC 325 MG PO TBEC: 325.0000 mg | DELAYED_RELEASE_TABLET | Freq: Two times a day (BID) | ORAL | Status: DC

## 2012-03-30 MED ORDER — METHOCARBAMOL 500 MG PO TABS: 500.0000 mg | ORAL_TABLET | Freq: Four times a day (QID) | ORAL | Status: DC | PRN

## 2012-03-30 MED ORDER — HYDROCODONE-ACETAMINOPHEN 7.5-325 MG PO TABS: 1.0000 | ORAL_TABLET | ORAL | Status: DC | PRN

## 2012-03-30 MED ORDER — DSS 100 MG PO CAPS: 100.0000 mg | ORAL_CAPSULE | Freq: Two times a day (BID) | ORAL | Status: DC

## 2012-03-30 NOTE — Progress Notes (Signed)
Clinical Social Work Department CLINICAL SOCIAL WORK PLACEMENT NOTE 03/30/2012  Patient:  [name] [name], [name] [name]  Account Number:  [id number] Admit date:  03/27/2012  Clinical Social Worker:  Cori Razor, LCSW  Date/time:  03/28/2012 03:05 PM  Clinical Social Work is seeking post-discharge placement for this patient at the following level of care:   SKILLED NURSING   (*CSW will update this form in Epic as items are completed)     Patient/family provided with [facility] Department of Clinical Social Work's list of facilities offering this level of care within the geographic area requested by the patient (or if unable, by the patient's family).    Patient/family informed of their freedom to choose among providers that offer the needed level of care, that participate in Medicare, Medicaid or managed care program needed by the patient, have an available bed and are willing to accept the patient.    Patient/family informed of [facility]' ownership interest in [facility], as well as of the fact that they are under no obligation to receive care at this facility.  PASARR submitted to EDS on 03/28/2012 PASARR number received from EDS on 03/28/2012  FL2 transmitted to all facilities in geographic area requested by pt/family on  03/28/2012 FL2 transmitted to all facilities within larger geographic area on   Patient informed that his/her managed care company has contracts with or will negotiate with  certain facilities, including the following:     Patient/family informed of bed offers received:  03/28/2012 Patient chooses bed at [facility] Physician recommends and patient chooses bed at    Patient to be transferred to [facility] on  03/30/2012 Patient to be transferred to facility by [facility]  The following physician request were entered in Epic:   Additional Comments:  Cori Razor LCSW [phone]

## 2012-03-30 NOTE — Progress Notes (Signed)
   Subjective: 3 Days Post-Op Procedure(s) (LRB): HARDWARE REMOVAL (Right) INTRAMEDULLARY (IM) NAIL INTERTROCHANTRIC (Right)   Patient reports pain as mild, pain well controlled. No events throughout the night. Ready for discharge to SNF.  Objective:   VITALS:   Filed Vitals:   03/30/12 0536  BP: 141/77  Pulse: 89  Temp: 98.3 F (36.8 C)  Resp: 16    Neurovascular intact Dorsiflexion/Plantar flexion intact Incision: dressing C/D/I No cellulitis present Compartment soft  LABS  Basename 03/29/12 0435 03/28/12 0458 03/27/12 1012  HGB 9.1* 6.8* 9.2*  HCT 28.2* 21.6* 27.0*  WBC 11.4* 8.7 --  PLT 302 295 --     Basename 03/29/12 0435 03/28/12 0458 03/27/12 1012  NA 135 133* 137  K 4.3 4.2 4.4  BUN 12 14 --  CREATININE 1.09 1.01 --  GLUCOSE 145* 118* 159*     Assessment/Plan: 3 Days Post-Op Procedure(s) (LRB): HARDWARE REMOVAL (Right) INTRAMEDULLARY (IM) NAIL INTERTROCHANTRIC (Right) Dressing changed with 4x4 guaze and tape Up with therapy Discharge to SNF Follow up in 2 weeks at [facility]. Follow up with OLIN,Magnus Crescenzo D in 2 weeks.  Contact information:  [facility] [address] [place] [address] [phone]    ABLA  Treated with 2 units of blood yesterday, has stabilized and will treat with iron and observe.   Morbid Obesity (BMI >40)  Estimated Body mass index is 47.92 kg/(m^2) as calculated from the following:  Height as of this encounter: 5\' 2" (1.575 m).  Weight as of this encounter: 262 lb(118.842 kg).  Patient also counseled that weight may inhibit the healing process  Patient counseled that losing weight will help with future health issues      Anastasio Auerbach. Despina Boan   PAC  03/30/2012, 8:57 AM

## 2012-03-30 NOTE — Discharge Summary (Signed)
Physician Discharge Summary  Patient ID: [name] [name] MRN: [id number] DOB/AGE: 63/28/1952 63 y.o.  Admit date: 03/27/2012 Discharge date:  03/30/2012  Procedures:  Procedure(s) (LRB): HARDWARE REMOVAL (Right) INTRAMEDULLARY (IM) NAIL INTERTROCHANTRIC (Right)  Attending Physician:  Dr. Durene Romans   Admission Diagnoses:   Right hip retained hardware, with right knee OA / pain  Discharge Diagnoses:  Principal Problem:  *Retained hardware right hip Active Problems:  Acute blood loss anemia  Morbid obesity with BMI of 45.0-49.9, adult  Hyponatremia COPD   Shortness of breath   Arthritis   Anemia   Diabetes mellitus   Hypertension   Depression   Peripheral neuropathy   Hyperlipemia   Hearing loss   Memory loss  Pneumonia   Bronchitis   GERD (gastroesophageal reflux disease)  Skin yeast infection    HPI: Pt is a 61 y.o. female complaining of right knee pain for many years. Pain had continually increased since the beginning. X-rays in the clinic show end-stage arthritic changes of the right knee. She has had a prior right hip fracture in 2012 that was fixed with a long rod. She had a periprosthetic fracture in 2011 around the long rod. The hip at this time looks to be healed, however prior to having knee surgery the retained hardware will need to be removed / exchanged for a short rod. Various options are discussed with the patient. Risks, benefits and expectations were discussed with the patient. Patient understand the risks, benefits and expectations and wishes to proceed with surgery. She knows that the procedure will be to remove the rod or exchange it with a short rod and allow the hip to heal prior to address the right knee for surgery.  PCP: No primary provider on file.   Discharged Condition: good  Hospital Course:  Patient underwent the above stated procedure on 03/27/2012. Patient tolerated the procedure well and brought to the recovery room in good condition  and subsequently to the floor.  POD #1 BP: 117/73 ; Pulse: 98 ; Temp: 99.5 F (37.5 C) ; Resp: 14  Pt's foley was removed, as well as the hemovac drain removed. IV was changed to a saline lock. Patient reports pain as mild, expressing minimal pain. She is quite out of it, but after waking up a little she states that she always has a hard time waking up. No events throughout the night. Nurse states that she wasn't out of it yesterday with the pain medication. Neurovascular intact, dorsiflexion/plantar flexion intact, incision: dressing C/D/I, no cellulitis present and compartment soft.   LABS  Basename  03/28/12 0458  HGB  6.8  HCT  21.6   POD #2  BP: 135/76 ; Pulse: 100 ; Temp: 97.4 F (36.3 C) ; Resp: 16  Patient reports pain as mild, pain well controlled. No events throughout the night. Feels better after receiving blood yesterday. Neurovascular intact, dorsiflexion/plantar flexion intact, incision: dressing C/D/I, no cellulitis present and compartment soft.   LABS  Basename  03/29/12 0435  HGB  9.1  HCT  28.2   POD #3  BP: 141/77 ; Pulse: 89 ; Temp: 98.3 F (36.8 C) ; Resp: 16  Patient reports pain as mild, pain well controlled. No events throughout the night. Ready for discharge to SNF.  Neurovascular intact, dorsiflexion/plantar flexion intact, incision: dressing C/D/I, no cellulitis present and compartment soft.   LABS   No new labs   Discharge Exam: General appearance: alert, cooperative and no distress Extremities: Homans sign is negative,  no sign of DVT, no edema, redness or tenderness in the calves or thighs and no ulcers, gangrene or trophic changes  Disposition: SNF with follow up in 2 weeks   Follow-up Information    Follow up with Shelda Pal, MD. Schedule an appointment as soon as possible for a visit in 2 weeks.   Contact information:   [address] [place] [address] [phone]          Discharge Orders    Future Orders Please  Complete By Expires   Diet - low sodium heart healthy      Call MD / Call 911      Comments:   If you experience chest pain or shortness of breath, CALL 911 and be transported to the hospital emergency room.  If you develope a fever above 101 F, pus (white drainage) or increased drainage or redness at the wound, or calf pain, call your surgeon's office.   Discharge instructions      Comments:   Daily dressing changes with 4x4 gauze and tape. Keep the area dry and clean until follow up. Follow up in 2 weeks at [facility]. Call with any questions or concerns.   Constipation Prevention      Comments:   Drink plenty of fluids.  Prune juice may be helpful.  You may use a stool softener, such as Colace (over the counter) 100 mg twice a day.  Use MiraLax (over the counter) for constipation as needed.   Increase activity slowly as tolerated      Change dressing      Comments:   Daily dressing changes with 4x4 guaze and tape. Keep the area dry and clean.   TED hose      Comments:   Use stockings (TED hose) for 2 weeks on both leg(s).  You may remove them at night for sleeping.      Current Discharge Medication List    START taking these medications   Details  aspirin EC 325 MG tablet Take 1 tablet (325 mg total) by mouth 2 (two) times daily. X 4 weeks Qty: 60 tablet, Refills: 0    diphenhydrAMINE (BENADRYL) 25 mg capsule Take 1 capsule (25 mg total) by mouth every 6 (six) hours as needed for itching, allergies or sleep. Qty: 30 capsule    docusate sodium 100 MG CAPS Take 100 mg by mouth 2 (two) times daily. Qty: 10 capsule    ferrous sulfate 325 (65 FE) MG tablet Take 1 tablet (325 mg total) by mouth 3 (three) times daily after meals.    HYDROcodone-acetaminophen (NORCO) 7.5-325 MG per tablet Take 1-2 tablets by mouth every 4 (four) hours as needed for pain. Qty: 120 tablet, Refills: 0    methocarbamol (ROBAXIN) 500 MG tablet Take 1 tablet (500 mg total) by mouth every 6  (six) hours as needed (muscle spasms). Qty: 50 tablet, Refills: 0      CONTINUE these medications which have NOT CHANGED   Details  albuterol-ipratropium (COMBIVENT) 18-103 MCG/ACT inhaler Inhale 2 puffs into the lungs every 6 (six) hours as needed. Wheezing and shortness of breath    alendronate (FOSAMAX) 70 MG tablet Take 70 mg by mouth every 7 (seven) days. Take with a full glass of water on an empty stomach.    cycloSPORINE (RESTASIS) 0.05 % ophthalmic emulsion Place 1 drop into both eyes 2 (two) times daily.    gabapentin (NEURONTIN) 600 MG tablet Take 600 mg by mouth 2 (two)  times daily.     lisinopril (PRINIVIL,ZESTRIL) 20 MG tablet Take 20 mg by mouth daily before breakfast.     metFORMIN (GLUCOPHAGE) 500 MG tablet Take 500 mg by mouth 2 (two) times daily with a meal.    nystatin-triamcinolone (MYCOLOG II) cream Apply 1 application topically 2 (two) times daily.    omeprazole (PRILOSEC) 20 MG capsule Take 20 mg by mouth at bedtime.     Polyethyl Glycol-Propyl Glycol (SYSTANE) 0.4-0.3 % SOLN Place 1 drop into both eyes 2 (two) times daily.     risperiDONE (RISPERDAL) 2 MG tablet Take 2 mg by mouth every evening.    Calcium Carbonate-Vitamin D (CALTRATE 600+D PO) Take 1 tablet by mouth daily.    fish oil-omega-3 fatty acids 1000 MG capsule Take 1 g by mouth 2 (two) times daily.     glucosamine-chondroitin 500-400 MG tablet Take 1 tablet by mouth 2 (two) times daily.     Multiple Vitamin (MULTIVITAMIN WITH MINERALS) TABS Take 1 tablet by mouth daily.    Multiple Vitamins-Minerals (OCUVITE EYE HEALTH FORMULA) CAPS Take 1 capsule by mouth daily.      STOP taking these medications     acetaminophen (TYLENOL) 500 MG tablet Comments:  Reason for Stopping:       diclofenac sodium (VOLTAREN) 1 % GEL Comments:  Reason for Stopping:       aspirin 81 MG tablet Comments:  Reason for Stopping:           Signed: Anastasio Auerbach. Kolbie Clarkston   PAC  03/30/2012, 9:06 AM

## 2012-03-30 NOTE — Progress Notes (Signed)
Physical Therapy Treatment Patient Details Name: [name] [name] MRN: [id number] DOB: [birth year] Today's Date: 03/30/2012 Time: 4098-1191 PT Time Calculation (min): 10 min  PT Assessment / Plan / Recommendation Comments on Treatment Session  Clarified WB status with PA (PWB 25-50%). Pt planning to d/c to rehab today. Ready for d/c. SNF for continued rehab to improve gait, balance, activity tolerance.     Follow Up Recommendations  SNF     Does the patient have the potential to tolerate intense rehabilitation     Barriers to Discharge        Equipment Recommendations  3 in 1 bedside comode    Recommendations for Other Services    Frequency Min 3X/week   Plan Discharge plan remains appropriate    Precautions / Restrictions Precautions Precautions: Fall Restrictions Weight Bearing Restrictions: Yes RLE Weight Bearing: Partial weight bearing RLE Partial Weight Bearing Percentage or Pounds: 25-50   Pertinent Vitals/Pain R LE (thigh and distal posterolateral lower leg)    Mobility  Bed Mobility Bed Mobility: Supine to Sit Supine to Sit: HOB elevated;3: Mod assist Details for Bed Mobility Assistance: Increased time. VCs safety. Assist for R LE off bed. Utilized bed pad to assist with positioning, scooting Transfers Transfers: Sit to Stand;Stand to Sit Sit to Stand: From bed;1: +2 Total assist Sit to Stand: Patient Percentage: 70% Stand to Sit: 3: Mod assist;To chair/3-in-1;With armrests Details for Transfer Assistance: VCs safety, technique, hand placement. Assist to rise, stabilize, control descent.  Ambulation/Gait Ambulation/Gait Assistance: 1: +2 Total assist Ambulation/Gait: Patient Percentage: 70% Ambulation Distance (Feet): 5 Feet Assistive device: Rolling walker Ambulation/Gait Assistance Details: Able to initiate ambulation this session. Pt declined ambulation in hallway. VCs safety, technique, sequence. TDWB-PWB status. Also pt has leg length discrepancy-unable to  keep R heel down on floor.  Gait Pattern: Step-to pattern;Decreased stance time - right    Exercises     PT Diagnosis:    PT Problem List:   PT Treatment Interventions:     PT Goals Acute Rehab PT Goals Pt will go Supine/Side to Sit: with min assist PT Goal: Supine/Side to Sit - Progress: Updated due to goal met Pt will go Sit to Stand: with mod assist PT Goal: Sit to Stand - Progress: Progressing toward goal Pt will Ambulate: 51 - 150 feet;with mod assist;with rolling walker PT Goal: Ambulate - Progress: Progressing toward goal  Visit Information  Last PT Received On: 03/30/12 Assistance Needed: +2 (safety, follow with chair)    Subjective Data  Subjective: "The place at my ankle hurts a little less" Patient Stated Goal: rehab today   Cognition  Overall Cognitive Status: Appears within functional limits for tasks assessed/performed Arousal/Alertness: Awake/alert Orientation Level: Appears intact for tasks assessed Behavior During Session: [facility] for tasks performed    Balance     End of Session PT - End of Session Equipment Utilized During Treatment: Gait belt Activity Tolerance: Patient tolerated treatment well Patient left: in chair;with call bell/phone within reach   GP     [name] [facility] 03/30/2012, 10:47 AM [phone]

## 2012-03-30 NOTE — Progress Notes (Signed)
Attempted to call report to [facility]. No RN available to take report. Patient VSS, in minimal pain. Pain meds due at noon. Left number with secretary in [place] Rn becomes available. Marvon Shillingburg [place], [place] 03/30/2012 11:22 AM

## 2012-04-24 ENCOUNTER — Inpatient Hospital Stay: Admit: 2012-04-24 | Payer: Self-pay | Admitting: Orthopedic Surgery

## 2012-04-24 SURGERY — ARTHROPLASTY, KNEE, TOTAL
Anesthesia: Spinal | Site: Knee | Laterality: Right

## 2013-01-29 ENCOUNTER — Other Ambulatory Visit: Payer: Self-pay

## 2013-01-29 DIAGNOSIS — Z1231 Encounter for screening mammogram for malignant neoplasm of breast: Secondary | ICD-10-CM

## 2013-02-19 ENCOUNTER — Ambulatory Visit
Admission: RE | Admit: 2013-02-19 | Discharge: 2013-02-19 | Disposition: A | Discharge status: Home / Self Care | Payer: Medicare Other | Source: Ambulatory Visit

## 2013-02-19 DIAGNOSIS — Z1231 Encounter for screening mammogram for malignant neoplasm of breast: Secondary | ICD-10-CM

## 2013-05-02 ENCOUNTER — Other Ambulatory Visit: Payer: Self-pay | Admitting: Adult Health

## 2013-07-20 ENCOUNTER — Other Ambulatory Visit: Payer: Self-pay | Admitting: Orthopedic Surgery

## 2013-07-20 DIAGNOSIS — S72309A Unspecified fracture of shaft of unspecified femur, initial encounter for closed fracture: Secondary | ICD-10-CM

## 2013-07-27 ENCOUNTER — Other Ambulatory Visit: Payer: Medicare Other

## 2013-07-30 ENCOUNTER — Ambulatory Visit
Admission: RE | Admit: 2013-07-30 | Discharge: 2013-07-30 | Disposition: A | Discharge status: Home / Self Care | Payer: Medicare Other | Source: Ambulatory Visit | Attending: Orthopedic Surgery | Admitting: Orthopedic Surgery

## 2013-07-30 DIAGNOSIS — S72309A Unspecified fracture of shaft of unspecified femur, initial encounter for closed fracture: Secondary | ICD-10-CM

## 2013-11-03 ENCOUNTER — Other Ambulatory Visit: Payer: Self-pay | Admitting: Adult Health

## 2013-12-25 ENCOUNTER — Encounter (HOSPITAL_COMMUNITY): Payer: Self-pay | Admitting: Pharmacy Technician

## 2014-01-01 NOTE — Pre-Procedure Instructions (Signed)
[name] [name]  01/01/2014   Your procedure is scheduled on:  Tuesday January 08, 2014 at 8:00 AM.  Report to [facility] Admitting at 6:00 AM.  Call this number if you have problems the morning of surgery: [phone]   Remember:   Do not eat food or drink liquids after midnight.   Take these medicines the morning of surgery with A SIP OF WATER: Acetaminophen (Tylenol), Aripiprazole (Abilify), Gabapentin (Neurontin), Combivent inhaler if needed   Do NOT take any diabetic medications the morning of your surgery   Do not wear jewelry, make-up or nail polish.  Do not wear lotions, powders, or perfumes.   Do not shave 48 hours prior to surgery.   Do not bring valuables to the hospital.  [facility] is not responsible for any belongings or valuables.               Contacts, dentures or bridgework may not be worn into surgery.  Leave suitcase in the car. After surgery it may be brought to your room.  For patients admitted to the hospital, discharge time is determined by your treatment team.               Patients discharged the day of surgery will not be allowed to drive home.  Name and phone number of your driver: Family/Friend  Special Instructions: Shower using CHG soap the night before and the morning of your surgery   Please read over the following fact sheets that you were given: Pain Booklet, Coughing and Deep Breathing and Surgical Site Infection Prevention

## 2014-01-02 ENCOUNTER — Encounter (HOSPITAL_COMMUNITY): Payer: Self-pay

## 2014-01-02 ENCOUNTER — Ambulatory Visit (HOSPITAL_COMMUNITY)
Admission: RE | Admit: 2014-01-02 | Discharge: 2014-01-02 | Disposition: A | Discharge status: Home / Self Care | Payer: Medicare Other | Source: Ambulatory Visit | Attending: Anesthesiology | Admitting: Anesthesiology

## 2014-01-02 ENCOUNTER — Encounter (HOSPITAL_COMMUNITY)
Admission: RE | Admit: 2014-01-02 | Discharge: 2014-01-02 | Disposition: A | Discharge status: Home / Self Care | Payer: Medicare Other | Source: Ambulatory Visit | Attending: Orthopedic Surgery | Admitting: Orthopedic Surgery

## 2014-01-02 DIAGNOSIS — Z01811 Encounter for preprocedural respiratory examination: Secondary | ICD-10-CM | POA: Insufficient documentation

## 2014-01-02 DIAGNOSIS — IMO0002 Reserved for concepts with insufficient information to code with codable children: Secondary | ICD-10-CM | POA: Diagnosis not present

## 2014-01-02 HISTORY — DX: Bipolar disorder, unspecified: F31.9

## 2014-01-02 HISTORY — DX: Anxiety disorder, unspecified: F41.9

## 2014-01-02 LAB — CBC
HCT: 34.1 % — ABNORMAL LOW (ref 36.0–46.0)
Hemoglobin: 10.5 g/dL — ABNORMAL LOW (ref 12.0–15.0)
MCH: 26.4 pg (ref 26.0–34.0)
MCHC: 30.8 g/dL (ref 30.0–36.0)
MCV: 85.9 fL (ref 78.0–100.0)
Platelets: 339 10*3/uL (ref 150–400)
RBC: 3.97 MIL/uL (ref 3.87–5.11)
RDW: 15.4 % (ref 11.5–15.5)
WBC: 11 10*3/uL — ABNORMAL HIGH (ref 4.0–10.5)

## 2014-01-02 LAB — BASIC METABOLIC PANEL
Anion gap: 14 (ref 5–15)
BUN: 21 mg/dL (ref 6–23)
CALCIUM: 9.9 mg/dL (ref 8.4–10.5)
CO2: 26 mEq/L (ref 19–32)
Chloride: 102 mEq/L (ref 96–112)
Creatinine, Ser: 0.97 mg/dL (ref 0.50–1.10)
GFR calc Af Amer: 70 mL/min — ABNORMAL LOW (ref >90)
GFR, EST NON AFRICAN AMERICAN: 60 mL/min — AB (ref >90)
GLUCOSE: 111 mg/dL — AB (ref 70–99)
Potassium: 4.7 mEq/L (ref 3.7–5.3)
Sodium: 142 mEq/L (ref 137–147)

## 2014-01-02 NOTE — Progress Notes (Signed)
Patient denied having a stress test, cardiac cath or sleep study. PCP is Bing Matter, PA-C in [place]. Patient denied having any acute cardiac or shortness of breath. Joann (sister) at chair side during PAT visit. Patient appeared to be very concerned when Nurse was explaining CHG instructions and stated "I will not be able to bathe with that soap the morning of my surgery because I have difficulty getting around with this hip and it's very early in the morning." Nurse explained to patient that if she was unable to let short stay staff know and CHG wipes could be provided to her. Patient verbalized understanding.

## 2014-01-02 NOTE — Progress Notes (Signed)
01/02/14 1011  OBSTRUCTIVE SLEEP APNEA  Have you ever been diagnosed with sleep apnea through a sleep study? No  Do you snore loudly (loud enough to be heard through closed doors)?  0  Do you often feel tired, fatigued, or sleepy during the daytime? 1  Has anyone observed you stop breathing during your sleep? 0  Do you have, or are you being treated for high blood pressure? 1  BMI more than 35 kg/m2? 1  Age over 65 years old? 1  Neck circumference greater than 40 cm/16 inches? 0  Gender: 0  Obstructive Sleep Apnea Score 4  Score 4 or greater  Results sent to PCP   This patient has screened at risk for sleep apnea using the STOP Bang tool used during a pre-surgical visit. A score of 4 or greater is at risk for sleep apnea.

## 2014-01-03 NOTE — Progress Notes (Signed)
Anesthesia Chart Review:  Patient is a 63 year old female posted for an intra-medullary nail right hip with infused allografting, hardware removal right proximal femur on 01/08/14 by Dr. Marcelino Scot.  History includes  former smoker, COPD, anemia, hypertension, diabetes mellitus type 2, peripheral neuropathy, hyperlipidemia, hearing loss, memory loss, GERD, depression, bipolar disorder, anxiety, tonsillectomy.  EKG on 01/02/14 showed: Sinus rhythm with first degree AV block, RSR prime or QR pattern in V1 suggests right ventricular conduction delay. No significant change since last tracing on 07/10/09.  Chest x-ray on 01/02/14 showed: Stable chest. No active cardiopulmonary process.  Preoperative labs noted.  Cr 0.97.  H/H 10.5/34.1. Glucose 111.   Her EKG appears stable.  HGB just over 10. Will defer decision for T&S to surgeon and/or anesthesiologist.  Myra Gianotti, PA-C [facility] Short Stay Center/Anesthesiology Phone [phone] 01/03/2014 1:00 PM

## 2014-01-07 MED ORDER — CHLORHEXIDINE GLUCONATE 4 % EX LIQD
60.0000 mL | Freq: Once | CUTANEOUS | Status: DC
  Filled 2014-01-07: qty 60

## 2014-01-07 MED ORDER — DEXTROSE 5 % IV SOLN
3.0000 g | INTRAVENOUS | Status: AC
  Administered 2014-01-08: 3 g via INTRAVENOUS
  Filled 2014-01-07: qty 3000

## 2014-01-07 NOTE — H&P (Signed)
Orthopaedic Trauma Surgery H&P   Chief Complaint: R proximal femur nonunion HPI:   63 y/o white female with extensive medical hx s/p multiple surgeries on R proximal femur.  First surgery was in march 2010 for a femoral neck fracture, she broke below this plate in May of 3299 where a longer DHS plate was used.  She was started on fosamax around her second procedure. She never healed this fracture and developed a persistent nonunion and broken hardware.  In 2011 (feb) she underwent IMN. Her nonunion persisted again and she was converted to a larger nail in hopes of uniting the fracture.  Her nonunion persists today, she continues to have R hip pain, she wears a shoe lift as her R leg is shorter. Pt was referred to OTS for evaluation and treatment.  Pt had extensive workup at our office for infection, nonunion and common underlying endocrine disorders that may have impaired her bone healing capacity. This workup was essentially normal. Pt has been on fosamax x 5 years, we told her to stop after her first visit with [place] in April of 2015. She is a former smoker, quit in 2009, she is a noninsulin dependent diabetic with fairly good sugar control  Pt presents today for nonunion revision   Past Medical History  Diagnosis Date  . COPD (chronic obstructive pulmonary disease)   . Shortness of breath   . Arthritis   . Anemia   . Diabetes mellitus   . Hypertension   . Peripheral neuropathy   . Hyperlipemia   . Hearing loss   . Memory loss   . Pneumonia   . Bronchitis   . GERD (gastroesophageal reflux disease)   . Encounter for blood transfusion   . Skin yeast infection     history of under bilateral breast,, pannus, inner legs  . Depression   . Bipolar disorder   . Anxiety     Past Surgical History  Procedure Laterality Date  . Tonsillectomy    . Femur fx    . Hip fracture surgery    . Esophagogastroduodenoscopy  10/22/2011    Procedure: ESOPHAGOGASTRODUODENOSCOPY (EGD);  Surgeon: Beryle Beams, MD;  Location: [facility];  Service: Endoscopy;  Laterality: N/A;  . Colonoscopy  10/22/2011    Procedure: COLONOSCOPY;  Surgeon: Beryle Beams, MD;  Location: [facility];  Service: Endoscopy;  Laterality: N/A;  . Hardware removal  03/27/2012    Procedure: HARDWARE REMOVAL;  Surgeon: Mauri Pole, MD;  Location: [facility];  Service: Orthopedics;  Laterality: Right;  . Intramedullary (im) nail intertrochanteric  03/27/2012    Procedure: INTRAMEDULLARY (IM) NAIL INTERTROCHANTRIC;  Surgeon: Mauri Pole, MD;  Location: [facility];  Service: Orthopedics;  Laterality: Right;  Possible Exchange with a Shorter Trochantric Nail    No family history on file. Social History:  reports that she quit smoking about 5 years ago. She has never used smokeless tobacco. She reports that she does not drink alcohol or use illicit drugs.  Allergies:  Allergies  Allergen Reactions  . Coffee Flavor Nausea And Vomiting    Per pt she is "allergic" to the smell of coffee  . Coffea Arabica Nausea And Vomiting  . Prednisone Other (See Comments)    Tightness in chest  . Other Other (See Comments)    Plain nystatin ointment-makes skin get worse      No current facility-administered medications on file prior to encounter.   Current Outpatient Prescriptions on File Prior to Encounter  Medication Sig Dispense Refill  . Calcium Carbonate-Vitamin D (CALTRATE 600+D PO) Take 1 tablet by mouth 2 (two) times daily.       Marland Kitchen docusate sodium 100 MG CAPS Take 100 mg by mouth 2 (two) times daily.  10 capsule    . gabapentin (NEURONTIN) 600 MG tablet Take 600-1,200 mg by mouth 2 (two) times daily. Take 600 mg by mouth in the morning and take 1200 mg by mouth in the evening.      Marland Kitchen lisinopril (PRINIVIL,ZESTRIL) 20 MG tablet Take 20 mg by mouth daily before breakfast.       . metFORMIN (GLUCOPHAGE) 500 MG tablet Take 500 mg by mouth 2 (two) times daily with a meal.      . methocarbamol (ROBAXIN) 500 MG tablet Take 1 tablet  (500 mg total) by mouth every 6 (six) hours as needed (muscle spasms).  50 tablet  0  . Multiple Vitamins-Minerals (OCUVITE EYE HEALTH FORMULA) CAPS Take 1 capsule by mouth 2 (two) times daily.       Marland Kitchen nystatin-triamcinolone (MYCOLOG II) cream Apply 1 application topically daily.       Marland Kitchen omeprazole (PRILOSEC) 20 MG capsule Take 20 mg by mouth at bedtime.       Vladimir Faster Glycol-Propyl Glycol (SYSTANE) 0.4-0.3 % SOLN Place 2 drops into both eyes 2 (two) times daily.        Labs  Results for Keckler, [name] [name] (MRN [id number]) as of 01/07/2014 21:18  Ref. Range 01/02/2014 10:43  Sodium Latest Range: 137-147 mEq/L 142  Potassium Latest Range: 3.7-5.3 mEq/L 4.7  Chloride Latest Range: 96-112 mEq/L 102  CO2 Latest Range: 19-32 mEq/L 26  BUN Latest Range: 6-23 mg/dL 21  Creatinine Latest Range: 0.50-1.10 mg/dL 0.97  Calcium Latest Range: 8.4-10.5 mg/dL 9.9  GFR calc non Af Amer Latest Range: >90 mL/min 60 (L)  GFR calc Af Amer Latest Range: >90 mL/min 70 (L)  Glucose Latest Range: 70-99 mg/dL 111 (H)  Anion gap Latest Range: 5-15  14  WBC Latest Range: 4.0-10.5 K/uL 11.0 (H)  RBC Latest Range: 3.87-5.11 MIL/uL 3.97  Hemoglobin Latest Range: 12.0-15.0 g/dL 10.5 (L)  HCT Latest Range: 36.0-46.0 % 34.1 (L)  MCV Latest Range: 78.0-100.0 fL 85.9  MCH Latest Range: 26.0-34.0 pg 26.4  MCHC Latest Range: 30.0-36.0 g/dL 30.8  RDW Latest Range: 11.5-15.5 % 15.4  Platelets Latest Range: 150-400 K/uL 339    Review of Systems  Constitutional: Negative for fever and chills.  Respiratory: Negative for shortness of breath and wheezing.   Cardiovascular: Negative for chest pain and palpitations.  Gastrointestinal: Negative for nausea, vomiting and abdominal pain.  Genitourinary: Negative for dysuria.  Musculoskeletal:       Chronic R leg pain   Neurological: Negative for tingling and sensory change.    There were no vitals taken for this visit. Physical Exam  Constitutional: She is cooperative.   Obese white female, very pleasant NAD  Cardiovascular: Normal rate, regular rhythm, S1 normal and S2 normal.   Respiratory:  CTA B   GI:  Obese, soft, NTND, +BS  Musculoskeletal:  Right Lower Extremity    R leg is shorter than left   Old healed surgical scars, lateral leg   Knee ROM 0-105, + knee pain and hip pain    DPN, SPN, TN sensation intact  EHL, FHL, AT, PT, peroneals, gastroc motor functions intact as well    + DP pulse   Ext warm      Neurological: She  is alert.  Skin: Skin is warm and intact.    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

## 2014-01-08 ENCOUNTER — Inpatient Hospital Stay (HOSPITAL_COMMUNITY): Payer: Medicare Other | Admitting: Certified Registered Nurse Anesthetist

## 2014-01-08 ENCOUNTER — Encounter (HOSPITAL_COMMUNITY): Payer: Medicare Other | Admitting: Vascular Surgery

## 2014-01-08 ENCOUNTER — Encounter (HOSPITAL_COMMUNITY)
Admission: RE | Disposition: A | Discharge status: Skilled Nursing Facility | Payer: Self-pay | Source: Ambulatory Visit | Attending: Orthopedic Surgery

## 2014-01-08 ENCOUNTER — Inpatient Hospital Stay (HOSPITAL_COMMUNITY): Payer: Medicare Other

## 2014-01-08 ENCOUNTER — Inpatient Hospital Stay (HOSPITAL_COMMUNITY)
Admission: RE | Admit: 2014-01-08 | Discharge: 2014-01-11 | DRG: 481 | Disposition: A | Discharge status: Skilled Nursing Facility | Payer: Medicare Other | Source: Ambulatory Visit | Attending: Orthopedic Surgery | Admitting: Orthopedic Surgery

## 2014-01-08 ENCOUNTER — Encounter (HOSPITAL_COMMUNITY): Payer: Self-pay | Admitting: *Deleted

## 2014-01-08 DIAGNOSIS — Y831 Surgical operation with implant of artificial internal device as the cause of abnormal reaction of the patient, or of later complication, without mention of misadventure at the time of the procedure: Secondary | ICD-10-CM | POA: Diagnosis present

## 2014-01-08 DIAGNOSIS — I10 Essential (primary) hypertension: Secondary | ICD-10-CM | POA: Diagnosis present

## 2014-01-08 DIAGNOSIS — Z6841 Body Mass Index (BMI) 40.0 and over, adult: Secondary | ICD-10-CM | POA: Diagnosis not present

## 2014-01-08 DIAGNOSIS — E785 Hyperlipidemia, unspecified: Secondary | ICD-10-CM | POA: Diagnosis present

## 2014-01-08 DIAGNOSIS — M949 Disorder of cartilage, unspecified: Secondary | ICD-10-CM

## 2014-01-08 DIAGNOSIS — G609 Hereditary and idiopathic neuropathy, unspecified: Secondary | ICD-10-CM | POA: Diagnosis present

## 2014-01-08 DIAGNOSIS — E119 Type 2 diabetes mellitus without complications: Secondary | ICD-10-CM | POA: Diagnosis present

## 2014-01-08 DIAGNOSIS — R5381 Other malaise: Secondary | ICD-10-CM | POA: Diagnosis present

## 2014-01-08 DIAGNOSIS — M899 Disorder of bone, unspecified: Secondary | ICD-10-CM | POA: Diagnosis present

## 2014-01-08 DIAGNOSIS — F319 Bipolar disorder, unspecified: Secondary | ICD-10-CM | POA: Diagnosis present

## 2014-01-08 DIAGNOSIS — F411 Generalized anxiety disorder: Secondary | ICD-10-CM | POA: Diagnosis present

## 2014-01-08 DIAGNOSIS — IMO0002 Reserved for concepts with insufficient information to code with codable children: Secondary | ICD-10-CM | POA: Diagnosis present

## 2014-01-08 DIAGNOSIS — F419 Anxiety disorder, unspecified: Secondary | ICD-10-CM | POA: Diagnosis present

## 2014-01-08 DIAGNOSIS — D62 Acute posthemorrhagic anemia: Secondary | ICD-10-CM | POA: Diagnosis not present

## 2014-01-08 DIAGNOSIS — Z87891 Personal history of nicotine dependence: Secondary | ICD-10-CM | POA: Diagnosis not present

## 2014-01-08 DIAGNOSIS — F32A Depression, unspecified: Secondary | ICD-10-CM | POA: Diagnosis present

## 2014-01-08 DIAGNOSIS — J4489 Other specified chronic obstructive pulmonary disease: Secondary | ICD-10-CM | POA: Diagnosis present

## 2014-01-08 DIAGNOSIS — J449 Chronic obstructive pulmonary disease, unspecified: Secondary | ICD-10-CM | POA: Diagnosis present

## 2014-01-08 DIAGNOSIS — K219 Gastro-esophageal reflux disease without esophagitis: Secondary | ICD-10-CM | POA: Diagnosis present

## 2014-01-08 DIAGNOSIS — T84498A Other mechanical complication of other internal orthopedic devices, implants and grafts, initial encounter: Principal | ICD-10-CM | POA: Diagnosis present

## 2014-01-08 DIAGNOSIS — F329 Major depressive disorder, single episode, unspecified: Secondary | ICD-10-CM | POA: Diagnosis present

## 2014-01-08 DIAGNOSIS — Z79899 Other long term (current) drug therapy: Secondary | ICD-10-CM

## 2014-01-08 DIAGNOSIS — S72001K Fracture of unspecified part of neck of right femur, subsequent encounter for closed fracture with nonunion: Secondary | ICD-10-CM | POA: Diagnosis present

## 2014-01-08 HISTORY — DX: Reserved for concepts with insufficient information to code with codable children: IMO0002

## 2014-01-08 HISTORY — PX: FEMUR IM NAIL: SHX1597

## 2014-01-08 HISTORY — PX: HARDWARE REMOVAL: SHX979

## 2014-01-08 LAB — CBC WITH DIFFERENTIAL/PLATELET
Basophils Absolute: 0 10*3/uL (ref 0.0–0.1)
Basophils Relative: 0 % (ref 0–1)
EOS PCT: 2 % (ref 0–5)
Eosinophils Absolute: 0.2 10*3/uL (ref 0.0–0.7)
HEMATOCRIT: 30.3 % — AB (ref 36.0–46.0)
Hemoglobin: 9.7 g/dL — ABNORMAL LOW (ref 12.0–15.0)
LYMPHS ABS: 1.7 10*3/uL (ref 0.7–4.0)
LYMPHS PCT: 21 % (ref 12–46)
MCH: 27.2 pg (ref 26.0–34.0)
MCHC: 32 g/dL (ref 30.0–36.0)
MCV: 84.9 fL (ref 78.0–100.0)
MONO ABS: 0.6 10*3/uL (ref 0.1–1.0)
Monocytes Relative: 8 % (ref 3–12)
NEUTROS ABS: 5.6 10*3/uL (ref 1.7–7.7)
Neutrophils Relative %: 69 % (ref 43–77)
Platelets: 326 10*3/uL (ref 150–400)
RBC: 3.57 MIL/uL — AB (ref 3.87–5.11)
RDW: 15.4 % (ref 11.5–15.5)
WBC: 8.2 10*3/uL (ref 4.0–10.5)

## 2014-01-08 LAB — URINALYSIS, ROUTINE W REFLEX MICROSCOPIC
BILIRUBIN URINE: NEGATIVE
Glucose, UA: NEGATIVE mg/dL
Hgb urine dipstick: NEGATIVE
Ketones, ur: NEGATIVE mg/dL
Leukocytes, UA: NEGATIVE
NITRITE: NEGATIVE
Protein, ur: NEGATIVE mg/dL
Specific Gravity, Urine: 1.02 (ref 1.005–1.030)
UROBILINOGEN UA: 0.2 mg/dL (ref 0.0–1.0)
pH: 5 (ref 5.0–8.0)

## 2014-01-08 LAB — COMPREHENSIVE METABOLIC PANEL
ALT: 14 U/L (ref 0–35)
AST: 16 U/L (ref 0–37)
Albumin: 3.1 g/dL — ABNORMAL LOW (ref 3.5–5.2)
Alkaline Phosphatase: 72 U/L (ref 39–117)
Anion gap: 11 (ref 5–15)
BUN: 20 mg/dL (ref 6–23)
CALCIUM: 9.5 mg/dL (ref 8.4–10.5)
CHLORIDE: 104 meq/L (ref 96–112)
CO2: 26 mEq/L (ref 19–32)
Creatinine, Ser: 0.91 mg/dL (ref 0.50–1.10)
GFR calc non Af Amer: 65 mL/min — ABNORMAL LOW (ref >90)
GFR, EST AFRICAN AMERICAN: 76 mL/min — AB (ref >90)
GLUCOSE: 122 mg/dL — AB (ref 70–99)
Potassium: 4.4 mEq/L (ref 3.7–5.3)
Sodium: 141 mEq/L (ref 137–147)
Total Protein: 7.3 g/dL (ref 6.0–8.3)

## 2014-01-08 LAB — POCT I-STAT 4, (NA,K, GLUC, HGB,HCT)
GLUCOSE: 166 mg/dL — AB (ref 70–99)
HCT: 32 % — ABNORMAL LOW (ref 36.0–46.0)
HEMOGLOBIN: 10.9 g/dL — AB (ref 12.0–15.0)
Potassium: 4.4 mEq/L (ref 3.7–5.3)
Sodium: 139 mEq/L (ref 137–147)

## 2014-01-08 LAB — GLUCOSE, CAPILLARY
Glucose-Capillary: 130 mg/dL — ABNORMAL HIGH (ref 70–99)
Glucose-Capillary: 162 mg/dL — ABNORMAL HIGH (ref 70–99)

## 2014-01-08 LAB — GRAM STAIN

## 2014-01-08 LAB — CBC
HCT: 27.4 % — ABNORMAL LOW (ref 36.0–46.0)
Hemoglobin: 8.7 g/dL — ABNORMAL LOW (ref 12.0–15.0)
MCH: 26.7 pg (ref 26.0–34.0)
MCHC: 31.8 g/dL (ref 30.0–36.0)
MCV: 84 fL (ref 78.0–100.0)
PLATELETS: 314 10*3/uL (ref 150–400)
RBC: 3.26 MIL/uL — ABNORMAL LOW (ref 3.87–5.11)
RDW: 15.2 % (ref 11.5–15.5)
WBC: 14.4 10*3/uL — AB (ref 4.0–10.5)

## 2014-01-08 LAB — C-REACTIVE PROTEIN: CRP: 6.4 mg/dL — ABNORMAL HIGH (ref <0.60)

## 2014-01-08 LAB — PROTIME-INR
INR: 1.05 (ref 0.00–1.49)
Prothrombin Time: 13.7 seconds (ref 11.6–15.2)

## 2014-01-08 LAB — SEDIMENTATION RATE: Sed Rate: 50 mm/hr — ABNORMAL HIGH (ref 0–22)

## 2014-01-08 LAB — ABO/RH: ABO/RH(D): A NEG

## 2014-01-08 LAB — BLOOD PRODUCT ORDER (VERBAL) VERIFICATION

## 2014-01-08 LAB — PREPARE RBC (CROSSMATCH)

## 2014-01-08 LAB — APTT: aPTT: 35 seconds (ref 24–37)

## 2014-01-08 SURGERY — INSERTION, INTRAMEDULLARY ROD, FEMUR
Anesthesia: General | Site: Hip | Laterality: Right

## 2014-01-08 MED ORDER — CEFAZOLIN SODIUM-DEXTROSE 2-3 GM-% IV SOLR
2.0000 g | Freq: Four times a day (QID) | INTRAVENOUS | Status: AC
  Administered 2014-01-08 – 2014-01-09 (×3): 2 g via INTRAVENOUS
  Filled 2014-01-08 (×3): qty 50

## 2014-01-08 MED ORDER — LACTATED RINGERS IV SOLN: INTRAVENOUS | Status: DC

## 2014-01-08 MED ORDER — FENTANYL CITRATE 0.05 MG/ML IJ SOLN
INTRAMUSCULAR | Status: AC
  Filled 2014-01-08: qty 5

## 2014-01-08 MED ORDER — EPHEDRINE SULFATE 50 MG/ML IJ SOLN
INTRAMUSCULAR | Status: DC | PRN
  Administered 2014-01-08 (×5): 10 mg via INTRAVENOUS

## 2014-01-08 MED ORDER — NEOSTIGMINE METHYLSULFATE 10 MG/10ML IV SOLN
INTRAVENOUS | Status: DC | PRN
  Administered 2014-01-08: 4 mg via INTRAVENOUS

## 2014-01-08 MED ORDER — SCOPOLAMINE 1 MG/3DAYS TD PT72
MEDICATED_PATCH | TRANSDERMAL | Status: DC | PRN
  Administered 2014-01-08: 1 via TRANSDERMAL

## 2014-01-08 MED ORDER — METOCLOPRAMIDE HCL 5 MG/ML IJ SOLN: 5.0000 mg | Freq: Three times a day (TID) | INTRAMUSCULAR | Status: DC | PRN

## 2014-01-08 MED ORDER — HYDROMORPHONE HCL PF 1 MG/ML IJ SOLN
0.5000 mg | INTRAMUSCULAR | Status: DC | PRN
  Administered 2014-01-08: 1 mg via INTRAVENOUS

## 2014-01-08 MED ORDER — GABAPENTIN 600 MG PO TABS
600.0000 mg | ORAL_TABLET | Freq: Every morning | ORAL | Status: DC
  Administered 2014-01-09 – 2014-01-11 (×3): 600 mg via ORAL
  Filled 2014-01-08 (×3): qty 1

## 2014-01-08 MED ORDER — POLYVINYL ALCOHOL 1.4 % OP SOLN
2.0000 [drp] | Freq: Two times a day (BID) | OPHTHALMIC | Status: DC
  Administered 2014-01-08 – 2014-01-11 (×5): 2 [drp] via OPHTHALMIC
  Filled 2014-01-08: qty 15

## 2014-01-08 MED ORDER — OXYCODONE HCL 5 MG PO TABS
ORAL_TABLET | ORAL | Status: AC
  Filled 2014-01-08: qty 1

## 2014-01-08 MED ORDER — IPRATROPIUM-ALBUTEROL 20-100 MCG/ACT IN AERS: 2.0000 | INHALATION_SPRAY | Freq: Four times a day (QID) | RESPIRATORY_TRACT | Status: DC | PRN

## 2014-01-08 MED ORDER — SUCCINYLCHOLINE CHLORIDE 20 MG/ML IJ SOLN
INTRAMUSCULAR | Status: AC
  Filled 2014-01-08: qty 1

## 2014-01-08 MED ORDER — OXYCODONE HCL 5 MG/5ML PO SOLN: 5.0000 mg | Freq: Once | ORAL | Status: DC | PRN

## 2014-01-08 MED ORDER — METOCLOPRAMIDE HCL 10 MG PO TABS: 5.0000 mg | ORAL_TABLET | Freq: Three times a day (TID) | ORAL | Status: DC | PRN

## 2014-01-08 MED ORDER — GLYCOPYRROLATE 0.2 MG/ML IJ SOLN
INTRAMUSCULAR | Status: DC | PRN
Start: 2014-01-08 — End: 2014-01-08
  Administered 2014-01-08: 0.2 mg via INTRAVENOUS
  Administered 2014-01-08: 0.6 mg via INTRAVENOUS
  Administered 2014-01-08 (×2): 0.2 mg via INTRAVENOUS

## 2014-01-08 MED ORDER — DIPHENHYDRAMINE HCL 12.5 MG/5ML PO ELIX: 12.5000 mg | ORAL_SOLUTION | ORAL | Status: DC | PRN

## 2014-01-08 MED ORDER — DEXTROSE 5 % IV SOLN
3.0000 g | INTRAVENOUS | Status: AC
  Administered 2014-01-08: 3 g via INTRAVENOUS
  Filled 2014-01-08: qty 3000

## 2014-01-08 MED ORDER — VANCOMYCIN HCL 500 MG IV SOLR
INTRAVENOUS | Status: AC
  Filled 2014-01-08: qty 500

## 2014-01-08 MED ORDER — PHENYLEPHRINE HCL 10 MG/ML IJ SOLN
INTRAMUSCULAR | Status: AC
  Filled 2014-01-08: qty 1

## 2014-01-08 MED ORDER — ONDANSETRON HCL 4 MG/2ML IJ SOLN
INTRAMUSCULAR | Status: DC | PRN
  Administered 2014-01-08: 4 mg via INTRAVENOUS

## 2014-01-08 MED ORDER — DOCUSATE SODIUM 100 MG PO CAPS
100.0000 mg | ORAL_CAPSULE | Freq: Two times a day (BID) | ORAL | Status: DC
  Administered 2014-01-08 – 2014-01-11 (×6): 100 mg via ORAL
  Filled 2014-01-08 (×7): qty 1

## 2014-01-08 MED ORDER — ALUM & MAG HYDROXIDE-SIMETH 200-200-20 MG/5ML PO SUSP: 30.0000 mL | ORAL | Status: DC | PRN

## 2014-01-08 MED ORDER — INSULIN ASPART 100 UNIT/ML ~~LOC~~ SOLN
0.0000 [IU] | Freq: Three times a day (TID) | SUBCUTANEOUS | Status: DC
  Administered 2014-01-09: 3 [IU] via SUBCUTANEOUS
  Administered 2014-01-09: 2 [IU] via SUBCUTANEOUS
  Administered 2014-01-10 – 2014-01-11 (×4): 3 [IU] via SUBCUTANEOUS

## 2014-01-08 MED ORDER — MENTHOL 3 MG MT LOZG: 1.0000 | LOZENGE | OROMUCOSAL | Status: DC | PRN

## 2014-01-08 MED ORDER — OXYCODONE HCL 5 MG PO TABS
5.0000 mg | ORAL_TABLET | ORAL | Status: DC | PRN
  Administered 2014-01-09: 10 mg via ORAL
  Administered 2014-01-09 (×2): 5 mg via ORAL
  Administered 2014-01-10 – 2014-01-11 (×2): 10 mg via ORAL
  Filled 2014-01-08: qty 2
  Filled 2014-01-08 (×2): qty 1
  Filled 2014-01-08 (×2): qty 2

## 2014-01-08 MED ORDER — NEOSTIGMINE METHYLSULFATE 10 MG/10ML IV SOLN
INTRAVENOUS | Status: AC
  Filled 2014-01-08: qty 1

## 2014-01-08 MED ORDER — LACTATED RINGERS IV SOLN
INTRAVENOUS | Status: DC
  Administered 2014-01-08: 08:00:00 via INTRAVENOUS

## 2014-01-08 MED ORDER — IPRATROPIUM-ALBUTEROL 0.5-2.5 (3) MG/3ML IN SOLN: 3.0000 mL | Freq: Four times a day (QID) | RESPIRATORY_TRACT | Status: DC | PRN

## 2014-01-08 MED ORDER — ARTIFICIAL TEARS OP OINT
TOPICAL_OINTMENT | OPHTHALMIC | Status: DC | PRN
  Administered 2014-01-08: 1 via OPHTHALMIC

## 2014-01-08 MED ORDER — SODIUM CHLORIDE 0.9 % IV SOLN: Freq: Once | INTRAVENOUS | Status: DC

## 2014-01-08 MED ORDER — METHOCARBAMOL 1000 MG/10ML IJ SOLN: 500.0000 mg | Freq: Four times a day (QID) | INTRAVENOUS | Status: DC | PRN

## 2014-01-08 MED ORDER — POLYETHYLENE GLYCOL 3350 17 G PO PACK
17.0000 g | PACK | Freq: Every day | ORAL | Status: DC
  Administered 2014-01-08 – 2014-01-11 (×4): 17 g via ORAL
  Filled 2014-01-08 (×4): qty 1

## 2014-01-08 MED ORDER — FENTANYL CITRATE 0.05 MG/ML IJ SOLN
INTRAMUSCULAR | Status: DC | PRN
  Administered 2014-01-08: 50 ug via INTRAVENOUS
  Administered 2014-01-08 (×2): 25 ug via INTRAVENOUS
  Administered 2014-01-08 (×2): 100 ug via INTRAVENOUS
  Administered 2014-01-08 (×2): 25 ug via INTRAVENOUS

## 2014-01-08 MED ORDER — VANCOMYCIN HCL 500 MG IV SOLR
INTRAVENOUS | Status: DC | PRN
  Administered 2014-01-08: 500 mg

## 2014-01-08 MED ORDER — PHENOL 1.4 % MT LIQD: 1.0000 | OROMUCOSAL | Status: DC | PRN

## 2014-01-08 MED ORDER — SUCCINYLCHOLINE CHLORIDE 20 MG/ML IJ SOLN
INTRAMUSCULAR | Status: DC | PRN
  Administered 2014-01-08: 100 mg via INTRAVENOUS

## 2014-01-08 MED ORDER — SODIUM CHLORIDE 0.9 % IV SOLN
INTRAVENOUS | Status: DC | PRN
  Administered 2014-01-08: 10:00:00 via INTRAVENOUS

## 2014-01-08 MED ORDER — GABAPENTIN 600 MG PO TABS: 600.0000 mg | ORAL_TABLET | Freq: Two times a day (BID) | ORAL | Status: DC

## 2014-01-08 MED ORDER — ONDANSETRON HCL 4 MG/2ML IJ SOLN
INTRAMUSCULAR | Status: AC
  Filled 2014-01-08: qty 2

## 2014-01-08 MED ORDER — GABAPENTIN 600 MG PO TABS
1200.0000 mg | ORAL_TABLET | Freq: Every evening | ORAL | Status: DC
  Administered 2014-01-09 – 2014-01-10 (×2): 1200 mg via ORAL
  Filled 2014-01-08 (×3): qty 2

## 2014-01-08 MED ORDER — ROCURONIUM BROMIDE 50 MG/5ML IV SOLN
INTRAVENOUS | Status: AC
  Filled 2014-01-08: qty 1

## 2014-01-08 MED ORDER — MIDAZOLAM HCL 2 MG/2ML IJ SOLN
INTRAMUSCULAR | Status: AC
  Filled 2014-01-08: qty 2

## 2014-01-08 MED ORDER — ACETAMINOPHEN 325 MG PO TABS
650.0000 mg | ORAL_TABLET | Freq: Four times a day (QID) | ORAL | Status: DC | PRN
  Filled 2014-01-08: qty 2

## 2014-01-08 MED ORDER — MAGNESIUM CITRATE PO SOLN: 1.0000 | Freq: Once | ORAL | Status: AC | PRN

## 2014-01-08 MED ORDER — PANTOPRAZOLE SODIUM 40 MG PO TBEC
40.0000 mg | DELAYED_RELEASE_TABLET | Freq: Every day | ORAL | Status: DC
  Administered 2014-01-08 – 2014-01-11 (×4): 40 mg via ORAL
  Filled 2014-01-08 (×4): qty 1

## 2014-01-08 MED ORDER — ALBUMIN HUMAN 5 % IV SOLN
INTRAVENOUS | Status: DC | PRN
  Administered 2014-01-08: 13:00:00 via INTRAVENOUS

## 2014-01-08 MED ORDER — LIDOCAINE HCL (CARDIAC) 20 MG/ML IV SOLN
INTRAVENOUS | Status: DC | PRN
  Administered 2014-01-08: 40 mg via INTRAVENOUS

## 2014-01-08 MED ORDER — POLYETHYL GLYCOL-PROPYL GLYCOL 0.4-0.3 % OP SOLN: 2.0000 [drp] | Freq: Two times a day (BID) | OPHTHALMIC | Status: DC

## 2014-01-08 MED ORDER — ENOXAPARIN SODIUM 30 MG/0.3ML ~~LOC~~ SOLN
30.0000 mg | Freq: Two times a day (BID) | SUBCUTANEOUS | Status: DC
  Administered 2014-01-09 – 2014-01-10 (×3): 30 mg via SUBCUTANEOUS
  Filled 2014-01-08 (×5): qty 0.3

## 2014-01-08 MED ORDER — OXYCODONE-ACETAMINOPHEN 5-325 MG PO TABS
1.0000 | ORAL_TABLET | Freq: Four times a day (QID) | ORAL | Status: DC | PRN
  Administered 2014-01-09 – 2014-01-10 (×2): 2 via ORAL
  Filled 2014-01-08 (×2): qty 2

## 2014-01-08 MED ORDER — 0.9 % SODIUM CHLORIDE (POUR BTL) OPTIME
TOPICAL | Status: DC | PRN
  Administered 2014-01-08: 1000 mL

## 2014-01-08 MED ORDER — PROPOFOL 10 MG/ML IV BOLUS
INTRAVENOUS | Status: DC | PRN
  Administered 2014-01-08: 140 mg via INTRAVENOUS

## 2014-01-08 MED ORDER — ARTIFICIAL TEARS OP OINT
TOPICAL_OINTMENT | OPHTHALMIC | Status: AC
  Filled 2014-01-08: qty 3.5

## 2014-01-08 MED ORDER — OXYCODONE HCL 5 MG PO TABS: 5.0000 mg | ORAL_TABLET | Freq: Once | ORAL | Status: DC | PRN

## 2014-01-08 MED ORDER — STERILE WATER FOR INJECTION IJ SOLN
INTRAMUSCULAR | Status: AC
  Filled 2014-01-08: qty 10

## 2014-01-08 MED ORDER — PHENYLEPHRINE HCL 10 MG/ML IJ SOLN
10.0000 mg | INTRAMUSCULAR | Status: DC | PRN
  Administered 2014-01-08: 50 ug/min via INTRAVENOUS
  Administered 2014-01-08: 20 ug/min via INTRAVENOUS

## 2014-01-08 MED ORDER — ROCURONIUM BROMIDE 100 MG/10ML IV SOLN
INTRAVENOUS | Status: DC | PRN
  Administered 2014-01-08: 50 mg via INTRAVENOUS

## 2014-01-08 MED ORDER — FERROUS SULFATE 325 (65 FE) MG PO TABS
325.0000 mg | ORAL_TABLET | Freq: Three times a day (TID) | ORAL | Status: DC
  Administered 2014-01-08 – 2014-01-11 (×9): 325 mg via ORAL
  Filled 2014-01-08 (×11): qty 1

## 2014-01-08 MED ORDER — VITAMIN D3 25 MCG (1000 UNIT) PO TABS
1000.0000 [IU] | ORAL_TABLET | Freq: Every day | ORAL | Status: DC
  Administered 2014-01-08 – 2014-01-11 (×4): 1000 [IU] via ORAL
  Filled 2014-01-08 (×4): qty 1

## 2014-01-08 MED ORDER — EPHEDRINE SULFATE 50 MG/ML IJ SOLN
INTRAMUSCULAR | Status: AC
  Filled 2014-01-08: qty 1

## 2014-01-08 MED ORDER — HYDROMORPHONE HCL PF 1 MG/ML IJ SOLN
INTRAMUSCULAR | Status: AC
  Filled 2014-01-08: qty 1

## 2014-01-08 MED ORDER — ARIPIPRAZOLE 10 MG PO TABS
10.0000 mg | ORAL_TABLET | Freq: Every day | ORAL | Status: DC
  Administered 2014-01-08 – 2014-01-11 (×4): 10 mg via ORAL
  Filled 2014-01-08 (×4): qty 1

## 2014-01-08 MED ORDER — MIDAZOLAM HCL 5 MG/5ML IJ SOLN
INTRAMUSCULAR | Status: DC | PRN
  Administered 2014-01-08: 2 mg via INTRAVENOUS

## 2014-01-08 MED ORDER — ONDANSETRON HCL 4 MG PO TABS: 4.0000 mg | ORAL_TABLET | Freq: Four times a day (QID) | ORAL | Status: DC | PRN

## 2014-01-08 MED ORDER — ACETAMINOPHEN 650 MG RE SUPP: 650.0000 mg | Freq: Four times a day (QID) | RECTAL | Status: DC | PRN

## 2014-01-08 MED ORDER — METHOCARBAMOL 500 MG PO TABS: 500.0000 mg | ORAL_TABLET | Freq: Four times a day (QID) | ORAL | Status: DC | PRN

## 2014-01-08 MED ORDER — PROPOFOL 10 MG/ML IV BOLUS
INTRAVENOUS | Status: AC
  Filled 2014-01-08: qty 20

## 2014-01-08 MED ORDER — BISACODYL 5 MG PO TBEC: 5.0000 mg | DELAYED_RELEASE_TABLET | Freq: Every day | ORAL | Status: DC | PRN

## 2014-01-08 MED ORDER — VECURONIUM BROMIDE 10 MG IV SOLR
INTRAVENOUS | Status: DC | PRN
  Administered 2014-01-08: 1 mg via INTRAVENOUS
  Administered 2014-01-08: 3 mg via INTRAVENOUS
  Administered 2014-01-08: 2 mg via INTRAVENOUS

## 2014-01-08 MED ORDER — ONDANSETRON HCL 4 MG/2ML IJ SOLN: 4.0000 mg | Freq: Four times a day (QID) | INTRAMUSCULAR | Status: DC | PRN

## 2014-01-08 MED ORDER — GLYCOPYRROLATE 0.2 MG/ML IJ SOLN
INTRAMUSCULAR | Status: AC
  Filled 2014-01-08: qty 5

## 2014-01-08 MED ORDER — HYDROMORPHONE HCL PF 1 MG/ML IJ SOLN
0.2500 mg | INTRAMUSCULAR | Status: DC | PRN
  Administered 2014-01-08 (×3): 0.5 mg via INTRAVENOUS

## 2014-01-08 MED ORDER — VECURONIUM BROMIDE 10 MG IV SOLR
INTRAVENOUS | Status: AC
  Filled 2014-01-08: qty 10

## 2014-01-08 MED ORDER — LACTATED RINGERS IV SOLN
INTRAVENOUS | Status: DC | PRN
  Administered 2014-01-08 (×3): via INTRAVENOUS

## 2014-01-08 SURGICAL SUPPLY — 105 items
BANDAGE ELASTIC 4 VELCRO ST LF (GAUZE/BANDAGES/DRESSINGS) ×1 IMPLANT
BANDAGE ELASTIC 6 VELCRO ST LF (GAUZE/BANDAGES/DRESSINGS) ×3 IMPLANT
BANDAGE ESMARK 6X9 LF (GAUZE/BANDAGES/DRESSINGS) ×1 IMPLANT
BIT DRILL QC 3-FLTD NP 5X145 (BIT) ×2 IMPLANT
BIT DRILL STEP 4.5X6.5 (BIT) IMPLANT
BNDG CMPR 9X6 STRL LF SNTH (GAUZE/BANDAGES/DRESSINGS) ×1
BNDG COHESIVE 6X5 TAN STRL LF (GAUZE/BANDAGES/DRESSINGS) ×3 IMPLANT
BNDG ESMARK 6X9 LF (GAUZE/BANDAGES/DRESSINGS) ×3
BNDG GAUZE ELAST 4 BULKY (GAUZE/BANDAGES/DRESSINGS) ×2 IMPLANT
BONE CANC CHIPS 40CC CAN1/2 (Bone Implant) ×9 IMPLANT
BRUSH SCRUB DISP (MISCELLANEOUS) ×4 IMPLANT
CATH THORACIC 36FR (CATHETERS) ×2 IMPLANT
CHIPS CANC BONE 40CC CAN1/2 (Bone Implant) ×3 IMPLANT
CLEANER TIP ELECTROSURG 2X2 (MISCELLANEOUS) ×3 IMPLANT
CLOSURE WOUND 1/2 X4 (GAUZE/BANDAGES/DRESSINGS)
COVER MAYO STAND STRL (DRAPES) ×2 IMPLANT
COVER PERINEAL POST (MISCELLANEOUS) ×1 IMPLANT
COVER SURGICAL LIGHT HANDLE (MISCELLANEOUS) ×4 IMPLANT
CUFF TOURNIQUET SINGLE 18IN (TOURNIQUET CUFF) IMPLANT
CUFF TOURNIQUET SINGLE 24IN (TOURNIQUET CUFF) IMPLANT
CUFF TOURNIQUET SINGLE 34IN LL (TOURNIQUET CUFF) IMPLANT
DRAPE C-ARM 42X72 X-RAY (DRAPES) ×2 IMPLANT
DRAPE C-ARMOR (DRAPES) ×3 IMPLANT
DRAPE OEC MINIVIEW 54X84 (DRAPES) ×1 IMPLANT
DRAPE ORTHO SPLIT 77X108 STRL (DRAPES) ×6
DRAPE PROXIMA HALF (DRAPES) IMPLANT
DRAPE STERI IOBAN 125X83 (DRAPES) ×3 IMPLANT
DRAPE SURG ORHT 6 SPLT 77X108 (DRAPES) IMPLANT
DRAPE U-SHAPE 47X51 STRL (DRAPES) ×3 IMPLANT
DRILL BIT STEP 4.5X6.5 (BIT) ×3
DRSG ADAPTIC 3X8 NADH LF (GAUZE/BANDAGES/DRESSINGS) ×1 IMPLANT
DRSG EMULSION OIL 3X3 NADH (GAUZE/BANDAGES/DRESSINGS) ×1 IMPLANT
DRSG MEPILEX BORDER 4X4 (GAUZE/BANDAGES/DRESSINGS) ×3 IMPLANT
DRSG MEPILEX BORDER 4X8 (GAUZE/BANDAGES/DRESSINGS) ×5 IMPLANT
ELECT BLADE 4.0 EZ CLEAN MEGAD (MISCELLANEOUS) ×3
ELECT REM PT RETURN 9FT ADLT (ELECTROSURGICAL) ×3
ELECTRODE BLDE 4.0 EZ CLN MEGD (MISCELLANEOUS) IMPLANT
ELECTRODE REM PT RTRN 9FT ADLT (ELECTROSURGICAL) ×1 IMPLANT
EVACUATOR 1/8 PVC DRAIN (DRAIN) IMPLANT
GAUZE SPONGE 4X4 12PLY STRL (GAUZE/BANDAGES/DRESSINGS) ×1 IMPLANT
GLOVE BIO SURGEON STRL SZ 6.5 (GLOVE) ×1 IMPLANT
GLOVE BIO SURGEON STRL SZ7.5 (GLOVE) ×3 IMPLANT
GLOVE BIO SURGEON STRL SZ8 (GLOVE) ×3 IMPLANT
GLOVE BIO SURGEONS STRL SZ 6.5 (GLOVE) ×1
GLOVE BIOGEL PI IND STRL 6.5 (GLOVE) IMPLANT
GLOVE BIOGEL PI IND STRL 7.5 (GLOVE) ×1 IMPLANT
GLOVE BIOGEL PI IND STRL 8 (GLOVE) ×1 IMPLANT
GLOVE BIOGEL PI INDICATOR 6.5 (GLOVE) ×2
GLOVE BIOGEL PI INDICATOR 7.5 (GLOVE) ×2
GLOVE BIOGEL PI INDICATOR 8 (GLOVE) ×2
GOWN STRL REUS W/ TWL LRG LVL3 (GOWN DISPOSABLE) ×2 IMPLANT
GOWN STRL REUS W/ TWL XL LVL3 (GOWN DISPOSABLE) ×1 IMPLANT
GOWN STRL REUS W/TWL LRG LVL3 (GOWN DISPOSABLE) ×6
GOWN STRL REUS W/TWL XL LVL3 (GOWN DISPOSABLE) ×6
GRAFT BNE CHIP CANC 1-8 40 (Bone Implant) IMPLANT
GUIDEPIN 3.2X17.5 THRD DISP (PIN) ×2 IMPLANT
GUIDEWIRE 3.2X400 (WIRE) ×6 IMPLANT
KIT BASIN OR (CUSTOM PROCEDURE TRAY) ×3 IMPLANT
KIT INFUSE LRG II (Orthopedic Implant) ×4 IMPLANT
KIT ROOM TURNOVER OR (KITS) ×3 IMPLANT
KIT STIMULAN RAPID CURE 5CC (Orthopedic Implant) ×2 IMPLANT
LINER BOOT UNIVERSAL DISP (MISCELLANEOUS) ×1 IMPLANT
MANIFOLD NEPTUNE II (INSTRUMENTS) ×1 IMPLANT
NAIL RECON ENTRY FEMUR 16X320 (Nail) ×2 IMPLANT
NEEDLE 22X1 1/2 (OR ONLY) (NEEDLE) IMPLANT
NS IRRIG 1000ML POUR BTL (IV SOLUTION) ×3 IMPLANT
PACK GENERAL/GYN (CUSTOM PROCEDURE TRAY) ×1 IMPLANT
PACK ORTHO EXTREMITY (CUSTOM PROCEDURE TRAY) ×3 IMPLANT
PAD ABD 8X10 STRL (GAUZE/BANDAGES/DRESSINGS) ×2 IMPLANT
PAD ARMBOARD 7.5X6 YLW CONV (MISCELLANEOUS) ×6 IMPLANT
PADDING CAST COTTON 6X4 STRL (CAST SUPPLIES) ×3 IMPLANT
REAMER ROD DEEP FLUTE 2.5X950 (INSTRUMENTS) ×4 IMPLANT
SCREW LOCK STAR 6X60 (Screw) ×2 IMPLANT
SCREW LOCK STAR 6X64 (Screw) ×2 IMPLANT
SCREW RECON STAR 6.5X100 (Screw) ×4 IMPLANT
SCREW RECON STAR 6.5X105 (Screw) ×2 IMPLANT
SPONGE GAUZE 4X4 12PLY STER LF (GAUZE/BANDAGES/DRESSINGS) ×2 IMPLANT
SPONGE LAP 18X18 X RAY DECT (DISPOSABLE) ×5 IMPLANT
SPONGE SCRUB IODOPHOR (GAUZE/BANDAGES/DRESSINGS) ×3 IMPLANT
STAPLER VISISTAT 35W (STAPLE) ×3 IMPLANT
STOCKINETTE IMPERVIOUS LG (DRAPES) ×3 IMPLANT
STRIP CLOSURE SKIN 1/2X4 (GAUZE/BANDAGES/DRESSINGS) IMPLANT
SUCTION FRAZIER TIP 10 FR DISP (SUCTIONS) ×2 IMPLANT
SUT ETHILON 2 0 FS 18 (SUTURE) ×4 IMPLANT
SUT ETHILON 3 0 PS 1 (SUTURE) ×5 IMPLANT
SUT PDS AB 2-0 CT1 27 (SUTURE) ×4 IMPLANT
SUT VIC AB 0 CT1 27 (SUTURE) ×3
SUT VIC AB 0 CT1 27XBRD ANBCTR (SUTURE) ×1 IMPLANT
SUT VIC AB 1 CT1 27 (SUTURE)
SUT VIC AB 1 CT1 27XBRD ANBCTR (SUTURE) ×1 IMPLANT
SUT VIC AB 2-0 CT1 27 (SUTURE) ×6
SUT VIC AB 2-0 CT1 TAPERPNT 27 (SUTURE) ×1 IMPLANT
SYR 5ML LUER SLIP (SYRINGE) ×8 IMPLANT
SYR CONTROL 10ML LL (SYRINGE) IMPLANT
SYRINGE 10CC LL (SYRINGE) ×2 IMPLANT
TAPE CLOTH SURG 4X10 WHT LF (GAUZE/BANDAGES/DRESSINGS) ×2 IMPLANT
TOWEL OR 17X24 6PK STRL BLUE (TOWEL DISPOSABLE) ×6 IMPLANT
TOWEL OR 17X26 10 PK STRL BLUE (TOWEL DISPOSABLE) ×6 IMPLANT
TUBE CONNECTING 12'X1/4 (SUCTIONS) ×1
TUBE CONNECTING 12X1/4 (SUCTIONS) ×2 IMPLANT
TUBING BULK SUCTION (MISCELLANEOUS) ×2 IMPLANT
TUBING CYSTO DISP (UROLOGICAL SUPPLIES) ×2 IMPLANT
UNDERPAD 30X30 INCONTINENT (UNDERPADS AND DIAPERS) ×3 IMPLANT
WATER STERILE IRR 1000ML POUR (IV SOLUTION) ×6 IMPLANT
YANKAUER SUCT BULB TIP NO VENT (SUCTIONS) ×5 IMPLANT

## 2014-01-08 NOTE — Anesthesia Postprocedure Evaluation (Signed)
  Anesthesia Post-op Note  Patient: [name] [name]  Procedure(s) Performed: Procedure(s): INTRAMEDULLARY (IM) NAIL RIGHT HIP WITH INFUSED ALLOGRAFTING (Right) HARDWARE REMOVAL RIGHT PROXIMAL FEMUR (Right)  Patient Location: PACU  Anesthesia Type:General  Level of Consciousness: awake  Airway and Oxygen Therapy: Patient Spontanous Breathing  Post-op Pain: mild  Post-op Assessment: Post-op Vital signs reviewed  Post-op Vital Signs: Reviewed  Last Vitals:  Filed Vitals:   01/08/14 1403  BP: 120/50  Pulse: 99  Temp:   Resp: 11    Complications: No apparent anesthesia complications

## 2014-01-08 NOTE — Anesthesia Procedure Notes (Signed)
Procedure Name: Intubation Date/Time: 01/08/2014 8:12 AM Performed by: [place] Pink Pre-anesthesia Checklist: Patient identified, Emergency Drugs available, Suction available, Patient being monitored and Timeout performed Patient Re-evaluated:Patient Re-evaluated prior to inductionOxygen Delivery Method: Circle system utilized Preoxygenation: Pre-oxygenation with 100% oxygen Intubation Type: IV induction Ventilation: Mask ventilation without difficulty Laryngoscope Size: Mac and 3 Grade View: Grade I Tube type: Oral Tube size: 7.0 mm Number of attempts: 1 Airway Equipment and Method: Stylet Placement Confirmation: ETT inserted through vocal cords under direct vision,  positive ETCO2 and breath sounds checked- equal and bilateral Secured at: 20 cm Tube secured with: Tape Dental Injury: Teeth and Oropharynx as per pre-operative assessment

## 2014-01-08 NOTE — Op Note (Signed)
NAME:  [name] [name]                 ACCOUNT NO.:  [id number]  MEDICAL RECORD NO.:  [id number]  LOCATION:  [id number]                        FACILITY:  [facility]  PHYSICIAN:  Astrid Divine. Marcelino Scot, M.D. DATE OF BIRTH:  03-08-1951  DATE OF PROCEDURE:  01/08/2014 DATE OF DISCHARGE:                              OPERATIVE REPORT   PREOPERATIVE DIAGNOSES: 1. Right proximal femur subtrochanteric nonunion. 2. Loose hardware. 3. Obesity.  POSTOPERATIVE DIAGNOSES: 1. Right proximal femur subtrochanteric nonunion. 2. Loose hardware. 3. Obesity.  PROCEDURES: 1. Intramedullary nailing of the right hip using a Synthes Recon nail,     16 x 320 statically locked. 2. Open repair of right femoral nonunion with removal of fibrous scar     tissue, mobilization, and reduction into valgus of the fracture and     INFUSE and cancellous allografting. 3. Removal of femoral nail.  SURGEON:  Astrid Divine. Marcelino Scot, M.D.  ASSISTANT:  [name], [place]  ANESTHESIA:  General.  COMPLICATIONS:  None.  I/O:  3200 mL of crystalloid, 250 mL albumin, 1 unit packed cells, 1025 mL UOP, 800 mL EBL.  SPECIMENS:  For anaerobic and aerobic cultures from distal locking bolt site, bone from the distal locking bolt site, reamings from within the canal.  Disposition of specimens to Micro.  FINDINGS:  Few monos and polys with no organisms.  DISPOSITION:  To PACU.  CONDITION:  Stable.  BRIEF SUMMARY AND INDICATIONS FOR PROCEDURE:  [name] [name] is a 63 year old female who has undergone multiple procedures for a right femoral fracture in the subtrochanteric region.  She has had persistent pain in mobility with loosening of the hardware.  She is anxious to gain union of this as she may proceed with total knee arthroplasty as well.  I have discussed with her the risks and benefits of surgery including possibility of infection, nerve injury, vessel injury, DVT, PE, loss of motion, malunion, persistent nonunion, and need for further  surgery among others.  She understands these risks and does wish to proceed.  BRIEF SUMMARY OF PROCEDURE:  [name] was given 3 g of Ancef preoperatively and was taken to the operating room where general anesthesia was induced.  Her positioning was complicated by her BMI of 49.  Standard prep and drape was performed.  The old incision was used to make 3 new wounds.  Rather than a small incision distally, the loose hardware had developed a osseous cap entirely over the distal locking bolt and as a consequence, this required a separate incision and a fairly extensive one.  Dissection was carried down to this osseous cap and it was debrided with a rongeur and curette.  We encountered rather viscous egress from beneath it that was concerning for infection. Anaerobic, aerobic specimens were sent as well as a segment of the bone cap itself.  All would ultimately be negative for any sign of infection. We irrigated this thoroughly and cleaned it out with a curette and lavage, removing all the fibrinous material.  The screw was then withdrawn.  Attention was then turned proximally where a 3-cm incision was made.  Dissection was carried sharply down with the 10 blade to the proximal aspect  of the greater trochanter.  A curette was engaged in the proximal aspect of the nail and then the extraction bolt.  We then made a mid femur incision, so that I can access the fracture site as well as the lag screw into the femoral head.  I carried sharply down through the scar tissue, placed Hohmann's.  This required the constant help of my assistant, Ainsley Spinner, who was pulling traction and also retracting at the same time to protect the neurovascular structures and enable exposure.  The tip of the lag screw was identified and extracted.  The nail was then withdrawn proximally.  The femur was then sequentially reamed in order to remove as much of the fibrinous material within the canal as feasible.  We used a  13, 14.5, and 15 mm, and then introduced the cystoscopy tubing into the proximal femur and irrigated the femur thoroughly.  Prior to performing this reaming, it should be noted that we continued with our direct exposure of the nonunion site, handling this in an open manner rather than just placing the nail.  Retractors were placed posteriorly and anteriorly and a combination of 10 and 15 blades were used to remove all the fibrinous material on the anterior and lateral aspects of the nonunion site as well as deep within the canal.  Rongeur and curettes added to the complete debridement.  Once this was done, then had a second assistant who was also retracting, applying direct pressure to the lateral femur to produce a valgus alignment to the nonunion site to assist [place] with promotion of healing. The canal was actually reamed at that point, and we removed the reamings and additional fibrous debris, scraped the open nonunion site once more, and then turned our attention proximally.  We placed at least 20 mL of graft in the proximal femur, introducing with a small syringe into the old lag screw tract and canal.  This was placed with a tamp and the assistance of x-ray.  Distally, I also placed graft into the large cavity left by the locking bolt and its osseous cap.  I did place some antibiotic beads with stimulant there as well with the beads containing vancomycin.  The guidewire was then placed once more and the femur reamed to 16 and then 17 mm.  We did not encounter significant resistance, but did at least get some cortical contact with the last 2 reamers.  Valgus was held throughout the reaming with the last 2 to make sure again we are able to achieve the orientation that we wanted of the bone.  The Synthes 16 x 320 mm nail was then inserted to the appropriate depth.  Two lag screws were placed into the proximal femur using new pathways and checking their position on multiple views into  the femoral head, these were each 100 mm. Distally, 2 locked screws were placed, the third was not possible because it was completely within the cavity created by the previous locking bolt.  The open nonunion repair was first treated prior to placement of the nail with distraction of the fracture site and use of a allograft containing INFUSE sponge against the medial aspect of the femur.  An additional sponge was placed posteromedially to augment this. Additional graft was placed over the sponges to prevent there being caught up with placement of the nail.  It should be noted that the guidewire was passed entirely lateral to this graft and valgus applied as the nail was slid down to prevent  this from going to a different location.  I then placed additional INFUSE and allograft along the anterior, posterior, and lateral aspects of the bone.  In total, we used 2 large INFUSE sponges and 120 mL of cancellous graft.  All wounds were irrigated thoroughly after final x-rays showed appropriate reduction, hardware placement, trajectory, and length.  Ainsley Spinner assisted throughout.  The patient was taken to the PACU in stable condition. Assistant was necessary for all portions of the procedure and at times, 2 assistants were required.  The operative time and difficulty was significantly increased by the patient's elevated BMI.  PROGNOSIS:  Ms. Kilman will be on pharmacologic DVT prophylaxis with unrestricted range of motion of the knee and hip.  She will have a large field electromagnetic bone stimulator, but at this time, she will continue with the Exogen which she already has.  She will be strict nonweightbearing for the next 6 to 8 weeks with graduated weightbearing thereafter.  She remains at elevated risk for infection given her multiple procedures and the extended operative time as well as the visually concerning distal locking bolt site, but we will follow up her cultures and treat through  to union if infection does turn out to be present.     Astrid Divine. Marcelino Scot, M.D.     MHH/MEDQ  D:  01/08/2014  T:  01/08/2014  Job:  [id number]

## 2014-01-08 NOTE — Progress Notes (Signed)
Orthopedic Tech Progress Note Patient Details:  [name] [name] [birth year] [id number]  Ortho Devices Ortho Device/Splint Location: footsie roll Ortho Device/Splint Interventions: Criss Alvine 01/08/2014, 7:12 PM

## 2014-01-08 NOTE — H&P (Signed)
I have seen and examined the patient. I agree with the findings above.  I discussed with the patient the risks and benefits of surgery for repair of right femoral nonunion, including the possibility of infection, nerve injury, vessel injury, wound breakdown, arthritis, symptomatic hardware, DVT/ PE, loss of motion, and need for further surgery among others.  She understood these risks and wished to proceed.   Rozanna Box, MD 01/08/2014 7:54 AM

## 2014-01-08 NOTE — Transfer of Care (Signed)
Immediate Anesthesia Transfer of Care Note  Patient: [name] [name]  Procedure(s) Performed: Procedure(s): INTRAMEDULLARY (IM) NAIL RIGHT HIP WITH INFUSED ALLOGRAFTING (Right) HARDWARE REMOVAL RIGHT PROXIMAL FEMUR (Right)  Patient Location: PACU  Anesthesia Type:General  Level of Consciousness: awake, alert  and oriented  Airway & Oxygen Therapy: Patient Spontanous Breathing and Patient connected to face mask oxygen  Post-op Assessment: Report given to PACU RN and Post -op Vital signs reviewed and stable  Post vital signs: Reviewed and stable  Complications: No apparent anesthesia complications

## 2014-01-08 NOTE — Brief Op Note (Signed)
01/08/2014  1:30 PM  PATIENT:  [name] [name]  63 y.o. female  PRE-OPERATIVE DIAGNOSIS:   1. RIGHT PROXIMAL FEMUR SUBTROCH NONUNION 2. LOOSE HARDWARE 3. OBESITY  POST-OPERATIVE DIAGNOSIS:   1. RIGHT PROXIMAL FEMUR SUBTROCH NONUNION 2. LOOSE HARDWARE 3. OBESITY  PROCEDURE:  Procedure(s): 1. INTRAMEDULLARY (IM) NAIL RIGHT HIP  2. OPEN REPAIR OF RIGHT FEMORAL NONUNION 3. INFUSE ALLOGRAFTING AND CANCELLOUS CHIP PLACEMENT (Right) 4. REMOVAL OF FEMORAL NAIL  SURGEON:  Surgeon(s) and Role:    * Rozanna Box, MD - Primary  PHYSICIAN ASSISTANT: Ainsley Spinner, PA-C  ANESTHESIA:   general  I/O:  Total I/O In: 9758 [I.V.:3200; Blood:335; IV Piggyback:250] Out: 8325 [Urine:1025; Blood:800]  SPECIMEN:  Source of Specimen:  Multiple: reamings, distal locking bolt site  DISPOSITION OF SPECIMEN:  To micro  TOURNIQUET:  * No tourniquets in log *  DICTATION: .Other Dictation: Dictation Number [id number]

## 2014-01-08 NOTE — Progress Notes (Signed)
Orthopaedic Trauma Service Progress Note  Subjective  Doing well post op Pain 3-4/10 On 5N  Objective   BP 108/47  Pulse 86  Temp(Src) 97.4 F (36.3 C) (Oral)  Resp 6  Ht 5\' 2"  (1.575 m)  Wt 121.564 kg (268 lb)  BMI 49.01 kg/m2  SpO2 100% Estimated body mass index is 49.01 kg/(m^2) as calculated from the following:   Height as of this encounter: 5\' 2"  (1.575 m).   Weight as of this encounter: 121.564 kg (268 lb).   Intake/Output     08/24 0701 - 08/25 0700 08/25 0701 - 08/26 0700   I.V. (mL/kg)  3200 (26.3)   Blood  335   IV Piggyback  250   Total Intake(mL/kg)  3785 (31.1)   Urine (mL/kg/hr)  1325 (1)   Blood  800 (0.6)   Total Output   2125   Net   +1660         Exam  Gen: appears comfortable, NAD  Ext:       Right Lower Extremity    Dressing c/d/i  DPN, SPN, TN sensation intact  Ext cool   + DP pulse   EHL, FHL, AT, PT, peroneals, gastroc motor intact     Assessment and Plan   POD/HD#: 0   63 y/o female 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 R subtrochanteric femur nonunion s/p IMN revision + allografting  NWB R leg x 6 -8 weeks  ROM R hip, knee and ankle as tolerated  Ice and elevate  PT/OT consults in am  Will need SNF at dc     2. Pain management:  Percocet 5/325 1-2 q6h prn  Oxy IR 5mg  1-2 q3h prn  Robaxin [phone] mg q6h prn  Gabapentin   3. ABL anemia/Hemodynamics  Cbc in am  4. Medical issues   COPD- home meds  DM- SSI     Restart metformin once pt taking adequate PO   HTN- lisinopril on hold for now   GERD- home meds      Depression/anxiety/bipolar- home meds   4. DVT/PE prophylaxis:  Lovenox to start tomorrow  5. ID:   periop ancef   6. Metabolic Bone Disease:  outpt workup fairly unremarkable  Recheck vitamin D as it has been several months since last eval   7. Activity:  See #1  PT/OT in am   8. FEN/Foley/Lines:  Advance diet as tolerated to CHO mod   9. Impediments to fracture  healing:  Long term fosamax use (has been off about 5 months)  Chronicity of nonunion   DM  Obesity   10. Morbid obesity    11. Dispo:  Inpatient monitoring  PT/OT in am  Plan for SNF thurs or Friday     [name], PA-C [facility] [phone] (P) 01/08/2014 6:27 PM  **Disclaimer: This note may have been dictated with voice recognition software. Similar sounding words can inadvertently be transcribed and this note may contain transcription errors which may not have been corrected upon publication of note.**

## 2014-01-08 NOTE — Anesthesia Preprocedure Evaluation (Addendum)
Anesthesia Evaluation    Airway Mallampati: II TM Distance: >3 FB Neck ROM: Full    Dental  (+) Teeth Intact, Dental Advisory Given   Pulmonary COPD COPD inhaler, former smoker,  breath sounds clear to auscultation        Cardiovascular hypertension, Pt. on medications Rhythm:Regular Rate:Normal     Neuro/Psych Anxiety Depression Bipolar Disorder    GI/Hepatic GERD-  Medicated and Controlled,  Endo/Other  diabetes, Type 2, Oral Hypoglycemic AgentsMorbid obesity  Renal/GU      Musculoskeletal   Abdominal   Peds  Hematology   Anesthesia Other Findings   Reproductive/Obstetrics                          Anesthesia Physical Anesthesia Plan  ASA: III  Anesthesia Plan: General   Post-op Pain Management:    Induction: Intravenous  Airway Management Planned: Oral ETT  Additional Equipment:   Intra-op Plan:   Post-operative Plan: Extubation in OR  Informed Consent: I have reviewed the patients History and Physical, chart, labs and discussed the procedure including the risks, benefits and alternatives for the proposed anesthesia with the patient or authorized representative who has indicated his/her understanding and acceptance.   Dental advisory given  Plan Discussed with: CRNA  Anesthesia Plan Comments:         Anesthesia Quick Evaluation

## 2014-01-08 NOTE — Progress Notes (Signed)
Ainsley Spinner, PA notified of patient's low blood pressure readings. He put in an order for a CBC in am. Night shift nurse notified in report as well.

## 2014-01-09 LAB — CBC
HCT: 24.5 % — ABNORMAL LOW (ref 36.0–46.0)
HEMOGLOBIN: 7.6 g/dL — AB (ref 12.0–15.0)
MCH: 26.1 pg (ref 26.0–34.0)
MCHC: 31 g/dL (ref 30.0–36.0)
MCV: 84.2 fL (ref 78.0–100.0)
Platelets: 301 10*3/uL (ref 150–400)
RBC: 2.91 MIL/uL — AB (ref 3.87–5.11)
RDW: 15.2 % (ref 11.5–15.5)
WBC: 8.7 10*3/uL (ref 4.0–10.5)

## 2014-01-09 LAB — VITAMIN D 25 HYDROXY (VIT D DEFICIENCY, FRACTURES)
VIT D 25 HYDROXY: 51 ng/mL (ref 30–89)
Vit D, 25-Hydroxy: 61 ng/mL (ref 30–89)

## 2014-01-09 LAB — URINE CULTURE
Colony Count: NO GROWTH
Culture: NO GROWTH

## 2014-01-09 LAB — BASIC METABOLIC PANEL
Anion gap: 9 (ref 5–15)
BUN: 13 mg/dL (ref 6–23)
CO2: 26 meq/L (ref 19–32)
Calcium: 8.9 mg/dL (ref 8.4–10.5)
Chloride: 104 mEq/L (ref 96–112)
Creatinine, Ser: 0.96 mg/dL (ref 0.50–1.10)
GFR calc Af Amer: 71 mL/min — ABNORMAL LOW (ref >90)
GFR calc non Af Amer: 61 mL/min — ABNORMAL LOW (ref >90)
Glucose, Bld: 135 mg/dL — ABNORMAL HIGH (ref 70–99)
POTASSIUM: 4.7 meq/L (ref 3.7–5.3)
SODIUM: 139 meq/L (ref 137–147)

## 2014-01-09 LAB — HEMOGLOBIN A1C
Hgb A1c MFr Bld: 6.4 % — ABNORMAL HIGH (ref <5.7)
Mean Plasma Glucose: 137 mg/dL — ABNORMAL HIGH (ref <117)

## 2014-01-09 LAB — GLUCOSE, CAPILLARY
Glucose-Capillary: 126 mg/dL — ABNORMAL HIGH (ref 70–99)
Glucose-Capillary: 116 mg/dL — ABNORMAL HIGH (ref 70–99)
Glucose-Capillary: 147 mg/dL — ABNORMAL HIGH (ref 70–99)
Glucose-Capillary: 172 mg/dL — ABNORMAL HIGH (ref 70–99)

## 2014-01-09 LAB — PREPARE RBC (CROSSMATCH)

## 2014-01-09 MED ORDER — ACETAMINOPHEN 325 MG PO TABS
650.0000 mg | ORAL_TABLET | Freq: Once | ORAL | Status: AC
  Administered 2014-01-09: 650 mg via ORAL

## 2014-01-09 MED ORDER — FUROSEMIDE 10 MG/ML IJ SOLN
20.0000 mg | Freq: Once | INTRAMUSCULAR | Status: AC
  Administered 2014-01-09: 20 mg via INTRAVENOUS
  Filled 2014-01-09: qty 2

## 2014-01-09 MED ORDER — SODIUM CHLORIDE 0.9 % IV SOLN
Freq: Once | INTRAVENOUS | Status: AC
  Administered 2014-01-09: 08:00:00 via INTRAVENOUS

## 2014-01-09 MED ORDER — FUROSEMIDE 10 MG/ML IJ SOLN: 20.0000 mg | Freq: Once | INTRAMUSCULAR | Status: DC

## 2014-01-09 MED ORDER — DIPHENHYDRAMINE HCL 25 MG PO CAPS
25.0000 mg | ORAL_CAPSULE | Freq: Once | ORAL | Status: AC
  Administered 2014-01-09: 25 mg via ORAL
  Filled 2014-01-09: qty 1

## 2014-01-09 NOTE — Progress Notes (Signed)
OT Cancellation Note  Patient Details Name: [name] [name] MRN: [id number] DOB: 1951-04-25   Cancelled Treatment:    Reason Eval/Treat Not Completed: Other (comment) Pt is Medicare and current D/C plan is SNF. No apparent immediate acute care OT needs, therefore will defer OT to SNF. If OT eval is needed please call Acute Rehab Dept. at [phone] or text page OT at [phone].   Juluis Rainier [phone] 01/09/2014, 4:11 PM

## 2014-01-09 NOTE — Progress Notes (Signed)
Utilization review completed.  

## 2014-01-09 NOTE — Evaluation (Signed)
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    Clinical Impression  Pt requires extensive A for all mobility.  Pt unable to even sit at EOB without WBing through R LE.  At this time pt will need SNF level of care at D/C.  Will continue to follow.      Follow Up Recommendations SNF    Equipment Recommendations  None recommended by PT    Recommendations for Other Services       Precautions / Restrictions Precautions Precautions: Fall Restrictions Weight Bearing Restrictions: Yes RUE Weight Bearing: Non weight bearing      Mobility  Bed Mobility Overal bed mobility: Needs Assistance;+2 for physical assistance Bed Mobility: Supine to Sit;Sit to Supine     Supine to sit: Max assist Sit to supine: Max assist;+2 for physical assistance   General bed mobility comments: cues for use of UEs, sequencing, and encouragement.  pt utilizes bed rails to A, however minimally able to A.    Transfers                    Ambulation/Gait                Stairs            Wheelchair Mobility    Modified Rankin (Stroke Patients Only)       Balance Overall balance assessment: Needs assistance Sitting-balance support: Single extremity supported;Feet supported Sitting balance-Leahy Scale: Poor Sitting balance - Comments: pt unable to maintain balance without UE support for more than a few seconds.  pt also utilizes R LE for support despite cueing for NWBing.                                       Pertinent Vitals/Pain Pain Assessment: 0-10 Pain Score: 5  Pain Location: R LE Pain Descriptors / Indicators: Aching Pain Intervention(s): Premedicated before session;Repositioned    Home Living Family/patient expects to be discharged to:: Skilled nursing facility                       Prior Function Level of Independence: Independent with assistive device(s)               Hand Dominance        Extremity/Trunk Assessment   Upper Extremity Assessment: Defer to OT evaluation           Lower Extremity Assessment: Generalized weakness;RLE deficits/detail RLE Deficits / Details: ROM and Strength limited by pain.         Communication   Communication: No difficulties  Cognition Arousal/Alertness: Awake/alert Behavior During Therapy: WFL for tasks assessed/performed Overall Cognitive Status: Within Functional Limits for tasks assessed                      General Comments      Exercises        Assessment/Plan    PT Assessment Patient needs continued PT services  PT Diagnosis Difficulty walking;Generalized weakness;Acute pain   PT Problem List Decreased strength;Decreased activity tolerance;Decreased balance;Decreased mobility;Decreased knowledge of use of DME;Obesity;Pain  PT Treatment Interventions DME instruction;Gait training;Functional mobility training;Therapeutic activities;Therapeutic exercise;Balance training;Patient/family education   PT Goals (Current goals can be found in the  Care Plan section) Acute Rehab PT Goals Patient Stated Goal: None stated.   PT Goal Formulation: With patient Time For Goal Achievement: 01/23/14 Potential to Achieve Goals: Good    Frequency Min 2X/week   Barriers to discharge        Co-evaluation               End of Session   Activity Tolerance: Patient tolerated treatment well Patient left: in bed;with call bell/phone within reach;with nursing/sitter in room Nurse Communication: Mobility status;Need for lift equipment         Time: 3570-1779 PT Time Calculation (min): 27 min   Charges:   PT Evaluation $Initial PT Evaluation Tier I: 1 Procedure PT Treatments $Therapeutic Activity: 23-37 mins   PT G CodesCatarina Hartshorn, [place] 01/09/2014, 3:08  PM

## 2014-01-09 NOTE — Progress Notes (Signed)
OT Cancellation Note  Patient Details Name: [name] [name] MRN: [id number] DOB: 1950-10-30   Cancelled Treatment:    Reason Eval/Treat Not Completed: Patient at procedure or test/ unavailable. Pt scheduled to receive blood this a.m. And pt's nurse request OT return later  Britt Bottom 01/09/2014, 11:10 AM

## 2014-01-09 NOTE — Progress Notes (Signed)
Offered bath, Pt stated she is about to receive blood and would like to wait for bath. RN aware.

## 2014-01-09 NOTE — Progress Notes (Signed)
[facility] Progress Note  Subjective  Doing ok  Got some sleep last night Pain 3/10 right now  Review of Systems  Constitutional: Negative for fever and chills.  Eyes: Negative for blurred vision.  Respiratory: Negative for shortness of breath and wheezing.   Cardiovascular: Negative for chest pain and palpitations.  Gastrointestinal: Negative for nausea, vomiting and abdominal pain.  Genitourinary:       Foley   Musculoskeletal:       R leg is sore   Neurological: Negative for tingling and sensory change.    Objective   BP 101/48  Pulse 99  Temp(Src) 98.5 F (36.9 C) (Oral)  Resp 18  Ht 5\' 2"  (1.575 m)  Wt 121.564 kg (268 lb)  BMI 49.01 kg/m2  SpO2 99%  Intake/Output     08/25 0701 - 08/26 0700 08/26 0701 - 08/27 0700   P.O.  360   I.V. (mL/kg) 3200 (26.3)    Blood 335    IV Piggyback 250    Total Intake(mL/kg) 3785 (31.1) 360 (3)   Urine (mL/kg/hr) 1675 (0.6)    Blood 800 (0.3)    Total Output 2475     Net +1310 +360          Labs  Results for Kleman, NAZARETH NORENBERG (MRN [id number]) as of 01/09/2014 09:33  Ref. Range 01/09/2014 05:02  Sodium Latest Range: 137-147 mEq/L 139  Potassium Latest Range: 3.7-5.3 mEq/L 4.7  Chloride Latest Range: 96-112 mEq/L 104  CO2 Latest Range: 19-32 mEq/L 26  BUN Latest Range: 6-23 mg/dL 13  Creatinine Latest Range: 0.50-1.10 mg/dL 0.96  Calcium Latest Range: 8.4-10.5 mg/dL 8.9  GFR calc non Af Amer Latest Range: >90 mL/min 61 (L)  GFR calc Af Amer Latest Range: >90 mL/min 71 (L)  Glucose Latest Range: 70-99 mg/dL 135 (H)  Anion gap Latest Range: 5-15  9  WBC Latest Range: 4.0-10.5 K/uL 8.7  RBC Latest Range: 3.87-5.11 MIL/uL 2.91 (L)  Hemoglobin Latest Range: 12.0-15.0 g/dL 7.6 (L)  HCT Latest Range: 36.0-46.0 % 24.5 (L)  MCV Latest Range: 78.0-100.0 fL 84.2  MCH Latest Range: 26.0-34.0 pg 26.1  MCHC Latest Range: 30.0-36.0 g/dL 31.0  RDW Latest Range: 11.5-15.5 % 15.2  Platelets Latest Range: 150-400 K/uL  301   CBG (last 3)   Recent Labs  01/08/14 0702 01/08/14 1447 01/09/14 0755  GLUCAP 130* 162* 126*      Exam  Gen: awake and alert, resting comfortably in bed, talking on phone Lungs: clear anterior fields Cardiac: RRR, s1 and s2 Abd: soft, NTND, + BS Ext:       Right Lower Extremity   Dressing c/d/i  + DP pulse  No dct  Compartments soft   Distal motor and sensory functions intact, ext warm   Assessment and Plan   POD/HD#: 1   63 y/o female 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 R subtrochanteric femur nonunion s/p IMN revision + allografting             NWB R leg x 6 -8 weeks             ROM R hip, knee and ankle as tolerated             Ice and elevate             PT/OT              Will need SNF at dc  2. Pain management:             Percocet 5/325 1-2 q6h prn             Oxy IR 5mg  1-2 q3h prn             Robaxin [phone] mg q6h prn             Gabapentin   3. ABL anemia/Hemodynamics  Will transfuse 2 units PRBCs today   CBC in am   4. Medical issues               COPD- home meds             DM- SSI                          Restart metformin once pt taking adequate PO              HTN- lisinopril on hold for now, BP soft               GERD- home meds                                        Depression/anxiety/bipolar- home meds   4. DVT/PE prophylaxis:             Lovenox  5. ID:               periop ancef   6. Metabolic Bone Disease:             outpt workup fairly unremarkable             vitamin D pending  Optimize nutrition     7. Activity:             See #1             PT/OT  8. FEN/Foley/Lines:             Advance diet as tolerated to CHO mod  Continue with foley as pt getting blood for strict I&O   9. Impediments to fracture healing:             Long term fosamax use (has been off about 5 months)             Chronicity of nonunion               DM             Obesity   10.  Morbid obesity                11. Dispo:             Inpatient monitoring             PT/OT             Plan for SNF thurs or Friday- SW consult      [name], PA-C [facility] [phone] (P) 01/09/2014 9:31 AM  **Disclaimer: This note may have been dictated with voice recognition software. Similar sounding words can inadvertently be transcribed and this note may contain transcription errors which may not have been corrected upon publication of note.**

## 2014-01-09 NOTE — Progress Notes (Signed)
Rept to K. Shepperson on call for Dr. Marcelino Scot. Will hold post transfusion dose of IV Lasix due to IV infiltrate x 2. Also will obtain repeat CBC in AM rather than post transfusion as per previous order per K. Shepperson's order.

## 2014-01-10 LAB — CBC
HEMATOCRIT: 29.5 % — AB (ref 36.0–46.0)
HEMOGLOBIN: 9.7 g/dL — AB (ref 12.0–15.0)
MCH: 28.4 pg (ref 26.0–34.0)
MCHC: 32.9 g/dL (ref 30.0–36.0)
MCV: 86.3 fL (ref 78.0–100.0)
Platelets: 277 10*3/uL (ref 150–400)
RBC: 3.42 MIL/uL — ABNORMAL LOW (ref 3.87–5.11)
RDW: 14.9 % (ref 11.5–15.5)
WBC: 9.4 10*3/uL (ref 4.0–10.5)

## 2014-01-10 LAB — GLUCOSE, CAPILLARY
Glucose-Capillary: 141 mg/dL — ABNORMAL HIGH (ref 70–99)
Glucose-Capillary: 122 mg/dL — ABNORMAL HIGH (ref 70–99)
Glucose-Capillary: 129 mg/dL — ABNORMAL HIGH (ref 70–99)

## 2014-01-10 MED ORDER — ENOXAPARIN SODIUM 40 MG/0.4ML ~~LOC~~ SOLN
40.0000 mg | SUBCUTANEOUS | Status: DC
  Administered 2014-01-11: 40 mg via SUBCUTANEOUS
  Filled 2014-01-10: qty 0.4

## 2014-01-10 MED ORDER — ENOXAPARIN SODIUM 30 MG/0.3ML ~~LOC~~ SOLN
30.0000 mg | Freq: Two times a day (BID) | SUBCUTANEOUS | Status: AC
  Administered 2014-01-10: 30 mg via SUBCUTANEOUS
  Filled 2014-01-10: qty 0.3

## 2014-01-10 MED ORDER — METHOCARBAMOL 500 MG PO TABS: 500.0000 mg | ORAL_TABLET | Freq: Four times a day (QID) | ORAL | Status: DC | PRN

## 2014-01-10 MED ORDER — MUPIROCIN 2 % EX OINT
1.0000 "application " | TOPICAL_OINTMENT | Freq: Two times a day (BID) | CUTANEOUS | Status: DC
  Administered 2014-01-10 – 2014-01-11 (×3): 1 via NASAL
  Filled 2014-01-10: qty 22

## 2014-01-10 MED ORDER — BISACODYL 5 MG PO TBEC: 5.0000 mg | DELAYED_RELEASE_TABLET | Freq: Every day | ORAL | Status: DC | PRN

## 2014-01-10 MED ORDER — ENOXAPARIN SODIUM 40 MG/0.4ML ~~LOC~~ SOLN: 40.0000 mg | SUBCUTANEOUS | Status: DC

## 2014-01-10 MED ORDER — MUPIROCIN 2 % EX OINT: 1.0000 "application " | TOPICAL_OINTMENT | Freq: Two times a day (BID) | CUTANEOUS | Status: DC

## 2014-01-10 MED ORDER — OXYCODONE-ACETAMINOPHEN 5-325 MG PO TABS: 1.0000 | ORAL_TABLET | Freq: Four times a day (QID) | ORAL | Status: DC | PRN

## 2014-01-10 MED ORDER — OXYCODONE HCL 5 MG PO TABS: 5.0000 mg | ORAL_TABLET | Freq: Four times a day (QID) | ORAL | Status: DC | PRN

## 2014-01-10 MED ORDER — CHLORHEXIDINE GLUCONATE CLOTH 2 % EX PADS
6.0000 | MEDICATED_PAD | Freq: Every day | CUTANEOUS | Status: DC
Start: 2014-01-10 — End: 2014-01-11
  Administered 2014-01-10 – 2014-01-11 (×2): 6 via TOPICAL

## 2014-01-10 NOTE — Evaluation (Signed)
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     Clinical Impression   Pt with decline in function and safety with ADLs and ADL mobility. Pt live sat hoe alone and currently requires extensive assist for ADLs/selfcare, requires increased time to complete bed mobility and functional tasks and is NWB R LE. Pt would benefit from acute OT services to address impairments to increase level of function and safety. Pt planing to d/c to a SNF for rehab before returning home    Follow Up Recommendations  SNF;Supervision/Assistance - 24 hour    Equipment Recommendations  None recommended by OT;Other (comment) (TBD at next venue of care)    Recommendations for Other Services       Precautions / Restrictions Precautions Precautions: Fall Restrictions Weight Bearing Restrictions: Yes RUE Weight Bearing: Non weight bearing RLE Weight Bearing: Non weight bearing      Mobility Bed Mobility Overal bed mobility: Needs Assistance;+2 for physical assistance Bed Mobility: Supine to Sit;Sit to Supine     Supine to sit: Max assist Sit to supine: Max assist;+2 for physical assistance   General bed mobility comments: cues for use of UEs, sequencing, and encouragement.  pt utilizes bed rails to A, however minimally able to A.  Nurse tech assisted. Pt requires increased time  Transfers                      Balance   Sitting-balance support: Single extremity supported;Feet supported;Bilateral upper extremity supported Sitting balance-Leahy Scale: Poor Sitting balance - Comments: pt unable to maintain balance without UE support .  pt also utilizes R LE for support despite cueing for NWBing.                                      ADL Overall ADL's : Needs assistance/impaired     Grooming: Wash/dry  hands;Wash/dry face;Brushing hair;Sitting;Moderate assistance Grooming Details (indicate cue type and reason): Poor sitting balance Upper Body Bathing: Moderate assistance;Sitting Upper Body Bathing Details (indicate cue type and reason): Poor sitting balance Lower Body Bathing: Maximal assistance;Sitting/lateral leans   Upper Body Dressing : Moderate assistance Upper Body Dressing Details (indicate cue type and reason): Poor sitting balance Lower Body Dressing: Total assistance   Toilet Transfer: Total assistance Toilet Transfer Details (indicate cue type and reason): unable at this time Toileting- Clothing Manipulation and Hygiene: Total assistance Pt requires increased time at [place]  wears glasses at all times                   Perception Perception Perception Tested?: No   Praxis Praxis Praxis tested?: Not tested    Pertinent Vitals/Pain Pain Score: 7/10 at start of session  Pain Location: R LE, 4/10 at end of session  Pain Descriptors / Indicators: Aching Pain Intervention(s): Repositioned;Ice applied;Limited activity within patient's tolerance;Monitored during session;RN gave pain meds during session     Hand Dominance Right   Extremity/Trunk Assessment Upper Extremity Assessment Upper Extremity Assessment: Generalized weakness;RUE deficits/detail RUE Deficits / Details: ROM and strength limited by pain RUE: Unable to fully assess due to pain   Lower Extremity Assessment Lower Extremity Assessment: Defer to PT evaluation  Communication Communication Communication: No difficulties   Cognition Arousal/Alertness: Awake/alert Behavior During Therapy: WFL for tasks assessed/performed Overall Cognitive Status: Within Functional Limits for tasks assessed                     General Comments   Pt pleasant and cooperative                 Home Living Family/patient expects to be discharged to:: Skilled nursing  facility Living Arrangements: Alone                                      Prior Functioning/Environment Level of Independence: Independent with assistive device(s)             OT Diagnosis: Acute pain;Generalized weakness   OT Problem List: Decreased strength;Decreased knowledge of use of DME or AE;Impaired UE functional use;Pain;Impaired balance (sitting and/or standing);Decreased activity tolerance;Decreased range of motion;Decreased knowledge of precautions   OT Treatment/Interventions: Self-care/ADL training;Therapeutic exercise;Patient/family education;Neuromuscular education;Therapeutic activities;DME and/or AE instruction    OT Goals(Current goals can be found in the care plan section) Acute Rehab OT Goals Patient Stated Goal: go to rehab at [facility] place then home OT Goal Formulation: With patient Time For Goal Achievement: 01/17/14 Potential to Achieve Goals: Good ADL Goals Pt Will Perform Grooming: with min assist;sitting;with set-up Pt Will Perform Upper Body Bathing: with min assist;sitting Pt Will Perform Lower Body Bathing: with mod assist;sitting/lateral leans;with caregiver independent in assisting Pt Will Perform Upper Body Dressing: with min assist;sitting Pt Will Transfer to Toilet: with +2 assist;bedside commode Additional ADL Goal #1: Pt will complete bed mobility with min A to sit EOB in prep for ADLs  OT Frequency: Min 2X/week   Barriers to D/C: Decreased caregiver support                        End of Session    Activity Tolerance: Patient limited by fatigue;Patient limited by pain Patient left: in bed;with call bell/phone within reach   Time: 1003-1036 OT Time Calculation (min): 33 min Charges:  OT General Charges $OT Visit: 1 Procedure OT Evaluation $Initial OT Evaluation Tier I: 1 Procedure OT Treatments $Self Care/Home Management : 8-22 mins $Therapeutic Activity: 8-22 mins G-Codes:    Britt Bottom 01/10/2014, 1:19 PM

## 2014-01-10 NOTE — Progress Notes (Signed)
Orthopaedic Trauma Service Progress Note  Subjective  Doing well Tolerated transfusion yesterday Tolerating diet No specific complaints today  Review of Systems  Constitutional: Negative for fever and chills.  Cardiovascular: Negative for chest pain and palpitations.  Gastrointestinal: Negative for nausea, vomiting and abdominal pain.  Genitourinary:       Foley  Musculoskeletal:       R leg soreness   Neurological: Negative for tingling and sensory change.      Objective   BP 117/57  Pulse 92  Temp(Src) 99.5 F (37.5 C) (Oral)  Resp 17  Ht 5\' 2"  (1.575 m)  Wt 121.564 kg (268 lb)  BMI 49.01 kg/m2  SpO2 93%  Intake/Output     08/26 0701 - 08/27 0700 08/27 0701 - 08/28 0700   P.O. 600 75   I.V. (mL/kg) 500 (4.1)    Blood 690    IV Piggyback     Total Intake(mL/kg) 1790 (14.7) 75 (0.6)   Urine (mL/kg/hr) 3975 (1.4)    Blood     Total Output 3975     Net -2185 +75          Labs  Cbc has been drawn and is pending   Results for Heiner, TYNSLEE BOWLDS (MRN [id number]) as of 01/10/2014 09:07  Ref. Range 01/09/2014 05:02  Hemoglobin A1C Latest Range: <5.7 % 6.4 (H)   Results for Shadd, TATUMN CORBRIDGE (MRN [id number]) as of 01/10/2014 09:07  Ref. 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 R subtrochanteric femur nonunion s/p IMN revision + allografting             NWB R leg x 6 -8 weeks             ROM R hip, knee and ankle as tolerated             Ice and  elevate             PT/OT               Will need SNF at dc    Dressing changes prn                2. Pain management:             Percocet 5/325 1-2 q6h prn             Oxy IR 5mg  1-2 q3h prn             Robaxin [phone] mg q6h prn             Gabapentin   3. ABL anemia/Hemodynamics                          tolerated 2 units PRBCs yesterday  Monitor   Cbc in am   4. Medical  issues               COPD- home meds             DM- SSI                          Restart metformin once pt taking adequate PO              HTN- lisinopril on hold for now, BP soft               GERD- home meds                                        Depression/anxiety/bipolar- home meds   4. DVT/PE prophylaxis:             Lovenox  Will continue lovenox a dc for a total of 4 weeks   5. ID:               periop ancef   6. Metabolic Bone Disease:             outpt workup fairly unremarkable             vitamin D normal at 51              Optimize nutrition    Hgba1c elevated, cbgs have been good                7. Activity:             See #1             PT/OT  8. FEN/Foley/Lines:             Advance diet as tolerated to CHO mod             dc foley    9. Impediments to fracture healing:             Long term fosamax use (has been off about 5 months)             Chronicity of nonunion               DM             Obesity   10. Morbid obesity                11. Dispo:             Inpatient monitoring             PT/OT             hopeful for snf tomorrow     [name], PA-C [facility] [phone] (P) 01/10/2014 9:08 AM  **Disclaimer: This note may have been dictated with voice recognition software. Similar sounding words can inadvertently be transcribed and this note may contain transcription errors which may not have been corrected upon publication of note.**

## 2014-01-10 NOTE — Discharge Instructions (Addendum)
[facility] Discharge Instructions   General Discharge Instructions  WEIGHT BEARING STATUS: Nonweightbearing right leg  RANGE OF MOTION/ACTIVITY: Range of motion tolerated right hip, knee and ankle  Wound care: Daily wound care changes with dry dressings, see instructions below  Diet: Carbohydrate modified diet.  Can use over the counter stool softeners and bowel preparations, such as Miralax, to help with bowel movements.  Narcotics can be constipating.  Be sure to drink plenty of fluids  STOP SMOKING OR USING NICOTINE PRODUCTS!!!!  As discussed nicotine severely impairs your body's ability to heal surgical and traumatic wounds but also impairs bone healing.  Wounds and bone heal by forming microscopic blood vessels (angiogenesis) and nicotine is a vasoconstrictor (essentially, shrinks blood vessels).  Therefore, if vasoconstriction occurs to these microscopic blood vessels they essentially disappear and are unable to deliver necessary nutrients to the healing tissue.  This is one modifiable factor that you can do to dramatically increase your chances of healing your injury.    (This means no smoking, no nicotine gum, patches, etc)  DO NOT USE NONSTEROIDAL ANTI-INFLAMMATORY DRUGS (NSAID'S)  Using products such as Advil (ibuprofen), Aleve (naproxen), Motrin (ibuprofen) for additional pain control during fracture healing can delay and/or prevent the healing response.  If you would like to take over the counter (OTC) medication, Tylenol (acetaminophen) is ok.  However, some narcotic medications that are given for pain control contain acetaminophen as well. Therefore, you should not exceed more than 4000 mg of tylenol in a day if you do not have liver disease.  Also note that there are may OTC medicines, such as cold medicines and allergy medicines that my contain tylenol as well.  If you have any questions about medications and/or interactions please ask your doctor/PA or your  pharmacist.   PAIN MEDICATION USE AND EXPECTATIONS  You have likely been given narcotic medications to help control your pain.  After a traumatic event that results in an fracture (broken bone) with or without surgery, it is ok to use narcotic pain medications to help control one's pain.  We understand that everyone responds to pain differently and each individual patient will be evaluated on a regular basis for the continued need for narcotic medications. Ideally, narcotic medication use should last no more than 6-8 weeks (coinciding with fracture healing).   As a patient it is your responsibility as well to monitor narcotic medication use and report the amount and frequency you use these medications when you come to your office visit.   We would also advise that if you are using narcotic medications, you should take a dose prior to therapy to maximize you participation.  IF YOU ARE ON NARCOTIC MEDICATIONS IT IS NOT PERMISSIBLE TO OPERATE A MOTOR VEHICLE (MOTORCYCLE/CAR/TRUCK/MOPED) OR HEAVY MACHINERY DO NOT MIX NARCOTICS WITH OTHER CNS (CENTRAL NERVOUS SYSTEM) DEPRESSANTS SUCH AS ALCOHOL       ICE AND ELEVATE INJURED/OPERATIVE EXTREMITY  Using ice and elevating the injured extremity above your heart can help with swelling and pain control.  Icing in a pulsatile fashion, such as 20 minutes on and 20 minutes off, can be followed.    Do not place ice directly on skin. Make sure there is a barrier between to skin and the ice pack.    Using frozen items such as frozen peas works well as the conform nicely to the are that needs to be iced.  USE AN ACE WRAP OR TED HOSE FOR SWELLING CONTROL  In addition to icing  and elevation, Ace wraps or TED hose are used to help limit and resolve swelling.  It is recommended to use Ace wraps or TED hose until you are informed to stop.    When using Ace Wraps start the wrapping distally (farthest away from the body) and wrap proximally (closer to the  body)   Example: If you had surgery on your leg or thing and you do not have a splint on, start the ace wrap at the toes and work your way up to the thigh        If you had surgery on your upper extremity and do not have a splint on, start the ace wrap at your fingers and work your way up to the upper arm  IF YOU ARE IN A SPLINT OR CAST DO NOT [place]   If your splint gets wet for any reason please contact the office immediately. You may shower in your splint or cast as long as you keep it dry.  This can be done by wrapping in a cast cover or garbage back (or similar)  Do Not stick any thing down your splint or cast such as pencils, money, or hangers to try and scratch yourself with.  If you feel itchy take benadryl as prescribed on the bottle for itching  IF YOU ARE IN A CAM BOOT (BLACK BOOT)  You may remove boot periodically. Perform daily dressing changes as noted below.  Wash the liner of the boot regularly and wear a sock when wearing the boot. It is recommended that you sleep in the boot until told otherwise  CALL THE OFFICE WITH ANY QUESTIONS OR CONCERTS: [phone]     Discharge Pin Site Instructions  Dress pins daily with Kerlix roll starting on POD 2. Wrap the Kerlix so that it tamps the skin down around the pin-skin interface to prevent/limit motion of the skin relative to the pin.  (Pin-skin motion is the primary cause of pain and infection related to external fixator pin sites).  Remove any crust or coagulum that may obstruct drainage with a saline moistened gauze or soap and water.  After POD 3, if there is no discernable drainage on the pin site dressing, the interval for change can by increased to every other day.  You may shower with the fixator, cleaning all pin sites gently with soap and water.  If you have a surgical wound this needs to be completely dry and without drainage before showering.  The extremity can be lifted by the fixator to facilitate  wound care and transfers.  Notify the office/Doctor if you experience increasing drainage, redness, or pain from a pin site, or if you notice purulent (thick, snot-like) drainage.  Discharge Wound Care Instructions  Do NOT apply any ointments, solutions or lotions to pin sites or surgical wounds.  These prevent needed drainage and even though solutions like hydrogen peroxide kill bacteria, they also damage cells lining the pin sites that help fight infection.  Applying lotions or ointments can keep the wounds moist and can cause them to breakdown and open up as well. This can increase the risk for infection. When in doubt call the office.  Surgical incisions should be dressed daily.  If any drainage is noted, use one layer of adaptic, then gauze, Kerlix, and an ace wrap.  Once the incision is completely dry and without drainage, it may be left open to air out.  Showering may begin 36-48 hours later.  Cleaning  gently with soap and water.  Traumatic wounds should be dressed daily as well.    One layer of adaptic, gauze, Kerlix, then ace wrap.  The adaptic can be discontinued once the draining has ceased    If you have a wet to dry dressing: wet the gauze with saline the squeeze as much saline out so the gauze is moist (not soaking wet), place moistened gauze over wound, then place a dry gauze over the moist one, followed by Kerlix wrap, then ace wrap.  Orthopedic injury and specific instructions   NWB R leg x 6 -8 weeks  ROM R hip, knee and ankle as tolerated  Ice and elevate  PT/OT  Dressing changes prn  TED hose  Stable for SNF

## 2014-01-11 LAB — VITAMIN D 1,25 DIHYDROXY
Vitamin D 1, 25 (OH)2 Total: 33 pg/mL (ref 18–72)
Vitamin D2 1, 25 (OH)2: <8 pg/mL
Vitamin D3 1, 25 (OH)2: 33 pg/mL

## 2014-01-11 LAB — CULTURE, ROUTINE-ABSCESS: Culture: NO GROWTH

## 2014-01-11 LAB — CBC
HEMATOCRIT: 28.8 % — AB (ref 36.0–46.0)
HEMOGLOBIN: 9.3 g/dL — AB (ref 12.0–15.0)
MCH: 28.1 pg (ref 26.0–34.0)
MCHC: 32.3 g/dL (ref 30.0–36.0)
MCV: 87 fL (ref 78.0–100.0)
Platelets: 319 10*3/uL (ref 150–400)
RBC: 3.31 MIL/uL — ABNORMAL LOW (ref 3.87–5.11)
RDW: 14.7 % (ref 11.5–15.5)
WBC: 10.9 10*3/uL — AB (ref 4.0–10.5)

## 2014-01-11 LAB — TYPE AND SCREEN
ABO/RH(D): A NEG
ANTIBODY SCREEN: NEGATIVE
UNIT DIVISION: 0
Unit division: 0
Unit division: 0
Unit division: 0

## 2014-01-11 LAB — GLUCOSE, CAPILLARY
GLUCOSE-CAPILLARY: 133 mg/dL — AB (ref 70–99)
Glucose-Capillary: 111 mg/dL — ABNORMAL HIGH (ref 70–99)

## 2014-01-11 LAB — TISSUE CULTURE: CULTURE: NO GROWTH

## 2014-01-11 NOTE — Care Management Note (Signed)
CARE MANAGEMENT NOTE 01/11/2014  Patient:  [name] [name], [name] [name]   Account Number:  [id number]  Date Initiated:  01/11/2014  Documentation initiated by:  Ricki Miller  Subjective/Objective Assessment:   63 yr old female admitted with right femur nonunion, s/p IM nailing right hip.     Action/Plan:   Patient is for shortterm rehab at [facility], Social worker aware. Will discharge to [facility].   Anticipated DC Date:  01/11/2014   Anticipated DC Plan:  SKILLED NURSING FACILITY  In-house referral  Clinical Social Worker      DC Planning Services  CM consult      Choice offered to / List presented to:     DME arranged  NA        [place] arranged  NA      Status of service:  Completed, signed off Medicare Important Message given?  YES (If response is "NO", the following Medicare IM given date fields will be blank) Date Medicare IM given:  01/10/2014 Medicare IM given by:  Ricki Miller Date Additional Medicare IM given:   Additional Medicare IM given by:    Discharge Disposition:  [place]  Per UR Regulation:  Reviewed for med. necessity/level of care/duration of stay

## 2014-01-11 NOTE — Progress Notes (Signed)
I have seen and examined the patient. I agree with the findings above.  Rozanna Box, MD 01/11/2014 8:01 AM

## 2014-01-11 NOTE — Clinical Social Work Note (Signed)
Pt chooses [facility] SNF who will be able to accommodate her financial situation (payment arrangements for her copay days).    Pt to dc to [place], SNF RN to call report to [phone] Transportation: [facility] Scheduled for: 1pm  Family notified: Pt alert and oriented.  Pt did not wish for CSW to reach out to family.  Nonnie Done, [place] [phone]  Psychiatric & Orthopedics (5N 1-16) Clinical Social Worker

## 2014-01-11 NOTE — Discharge Summary (Signed)
NAME:  [name], [name]                 ACCOUNT NO.:  [id number]  MEDICAL RECORD NO.:  [id number]  LOCATION:  5N28C                        FACILITY:  [facility]  PHYSICIAN:  Astrid Divine. Marcelino Scot, M.D. DATE OF BIRTH:  July 25, 1950  DATE OF ADMISSION:  01/08/2014 DATE OF DISCHARGE:  01/11/2014                              DISCHARGE SUMMARY   ADMISSION DIAGNOSES: 1. Right subtrochanteric femoral nonunion. 2. Morbid obesity. 3. Chronic obstructive pulmonary disease. 4. Hypertension. 5. Gastroesophageal reflux disease. 6. Depression. 7. Bipolar disorder. 8. Anxiety.  DISCHARGE DIAGNOSES: 1. Principal problem:  Nonunion right subtrochanteric femur. 2. Acute blood loss anemia. 3. Morbid obesity. 4. Chronic obstructive pulmonary disease. 5. Hypertension. 6. Gastroesophageal reflux disease. 7. Depression. 8. Bipolar disorder. 9. Anxiety.  PROCEDURES PERFORMED: 1. January 08, 2014, Dr. Marcelino Scot, IM nailing of right hip with [employer]s     recon nail 16 x 320 statically locked. 2. Open repair of right femoral nonunion with removal of fibrous     eschar and infuse and cancellous allografting. 3. Removal of hardware, right femur.  DISCHARGE CONDITION:  Good.  HOSPITAL COURSE:  The patient is a very pleasant 63 year old white female, who is well known to the Orthopedic Trauma Service whom we saw back in April 2015.  The patient's issues initially began back in March 2010 when she sustained a femoral neck fracture that was treated with DHS.  This was converted to another DHS and subsequently underwent several other procedures where she was converted to an IM nail.  Please see H and P for full summary.  She was eventually referred to the Orthopedic Trauma Service for evaluation and treatment.  She had an extensive outpatient workup looking for infection, nonunion, and other underlying endocrine disorders that may have impaired her bone healing. The only contributing thing that we could find was her  Fosamax use for about 5 years since her fracture started as well as her diabetes, which is fairly well controlled.  She is a former smoker and stopped smoking before her inciting accident.  Her Fosamax was stopped after her 1st visit with [place].  After repeat CT scan and review of labs and data as well as physical exam,  we discussed surgical intervention, which the patient was eventually agreeable too.  The patient was taken to the OR on January 08, 2014 for the procedures noted above.  After surgery, she was transferred to the PACU for recovery from anesthesia and then was transferred to the orthopedic floor for continued observation and pain control.  On postoperative day #1, the patient was doing fairly well.  She did get some sleep and pain was controlled.  No major issues were noted.  She did work with physical therapy fairly well.  Her H and H was noted to be a low at 7.6 and 24.5, as such we decided to proceed with transfusion of 2 units of packed red blood cells.  The patient tolerated this very well.  On postoperative day #2, she continued to do well.  Her Foley was removed.  She continued to work with therapy.  Pain continued to remain well controlled.  She was more mobile to the bed to chair.  The  patient was started on Lovenox for DVT and PE prophylaxis on postoperative day #1 as well.  She was started on SSI for her diabetes.  Her metformin was on hold until she was taking adequate p.o.  Blood pressures were trending on the low side, so her lisinopril was held as well.  Remainder of her home medications were restarted on postoperative day #1 as well. We did recheck her vitamin D level, which was normal at 51.  Her hemoglobin A1c was 6.4 and CBGs were within acceptable ranges during her hospital stay.  On postoperative day #3, the patient was doing extraordinarily well. Pain was well controlled.  Mobilizing much better and tolerating regular diet.  Voiding without any  difficulty, passing gas and did have bowel movement.  As such, the patient was deemed to be stable for discharge to a skilled nursing facility as she does not have any assistance at home.  CONSULTS:  None.  SIGNIFICANT DIAGNOSTIC STUDIES AND LABS:  For January 11, 2014, white blood cell was 10.9, hemoglobin 9.3, hematocrit 28.8, platelets 319, hemoglobin A1c 6.4, 25 hydroxy vitamin D 51.  Sodium 139, potassium 4.7, chloride 104, bicarb 26, BUN 13, creatinine 0.96, calcium 8.9, anion gap 9.  TREATMENTS RENDERED:  IV hydration with antibiotics, which included Ancef .  Analgesia include Dilaudid, Percocet, and OxyIR. Anticoagulation Lovenox.  Insulin NovoLog sliding scale.  Therapies: PT, OT, RN, and social work.  REVIEW OF SYSTEMS:  Unremarkable.  PHYSICAL EXAMINATION:  SUBJECTIVE:  January 11, 2014, the patient is doing much better.  Pain is well controlled. Right leg is sore. She did sit in the chair for several hours yesterday. VITAL SIGNS:  BP 114/42, heart rate 86, temperature 98.3, respirations 16 at 95%.  Weight is 288 pounds.  BMI of 52.75.  Last 3 CBG is 122, 129, and 111. GENERAL:  The patient is awake, alert, uncomfortable appearing, lying in bed, in no acute distress. LUNGS:  Clear to auscultation bilaterally. CARDIAC:  Regular rate and rhythm, S1 and S2. ABDOMEN:  Soft, nontender.  Positive bowel sounds.  Obese. EXTREMITIES:  Right lower extremity dressing was changed today. Operative wounds look fantastic.  Scant sanguinous drainage is noted. No signs of infection.  No erythema.  Right heel looks good.  No areas of pressure noted.  Extremities are warm.  Palpable dorsalis pedis pulses noted.  No deep calf tenderness is noted.  EHL, FHL, anterior tibialis, posterior tibialis, peroneals, gastrocsoleus complex, motor function are intact.  Deep peroneal nerve, superficial peroneal nerve, and tibial nerve sensory function are intact.  Swelling is stable.  ASSESSMENT AND  PLAN:  Postop day #53, 63 year old female, status post revision of chronic right subtrochanteric femur nonunion. 1. Right subtrochanteric femur nonunion, status post revision and     allografting.  Nonweightbearing, right leg x6-8 weeks.  Range of     motion right hip, knee, and ankle as tolerated.  Ice and elevate as     needed.  PT and OT.  Dressings changed today.  Dressing changes as     needed.  TED hose, right leg.  Stable for SNF. 2. Pain management.  Percocet 5/325 one to two p.o. q.6 hours as     needed.  OxyIR 5 mg one to two p.o. q.3 hours as needed.  Robaxin     500 mg 1-2 p.o. q.6 hours as needed.  Gabapentin per home dose. 3. Acute blood loss anemia, stable.  The patient received a total of 3  units during her hospital stay. 4. Medical issues.  Chronic obstructive pulmonary disease:  Continue     home meds.  Diabetes:  Restart metformin and discontinue sliding     scale.  Hypertension continue to hold lisinopril.  BPs are quite     soft.  Gastroesophageal reflux disease , continue home medicines.     Depression/anxiety/bipolar.  Home meds. 5. Deep venous thrombosis/pulmonary embolism prophylaxis.  Lovenox.     We will continue Lovenox for 4 weeks total.  The patient completed     perioperative Ancef. 6. Metabolic bone disease.  Outpatient workup is fairly unremarkable.     Vitamin D is normal at 51, optimize nutrition.  Hemoglobin A1c is     slightly elevated, but  CBGs have included. 7. Activities.  Please see #1 and continue with therapies. 8. FEN//Foley/lines.  Advance diet as tolerated.  Carbohydrate     modified.  DC IV and IV fluids. 9. Impediments of fracture healing.  Long-term Fosamax use for     chronicity of nonunion, diabetes, obesity. 10.Morbid obesity. 11.Disposition:  The patient is ready for SNF when available.  DISCHARGE MEDICATIONS:  Include: 1. Omeprazole 10 mg p.o. daily. 2. Aspirin 81 mg p.o. daily. 3. Bisacodyl 5 mg 1 tab p.o. daily p.r.n.  constipation. 4. Caltrate 1 p.o. b.i.d. 5. Cholecalciferol 1000 international units daily. 6. Combivent 20-100 two puffs in the lungs every 6 hours as needed for     wheezing. 7. Colace 100 mg 2 p.o. daily. 8. Lovenox 40 mg 1 subcu daily x4 weeks. 9. Ferrous sulfate 325 mg 1 p.o. b.i.d. 10.Fish oil 400 mg p.o. b.i.d. 11.Gabapentin 600 mg in the morning and 1200 mg in the evening. 12.Lisinopril on hold for now, but 20 mg p.o. daily before breakfast. 13.Metformin 500 mg 1 p.o. b.i.d. with meals. 14.Robaxin 1-2 p.o. q.6 hours as needed for spasms. 16.XWRUEAVWU placed 1 application in the nose 2 times daily. 16.Nystatin triamcinolone cream apply 1 application topical daily. 17.Ocuvite 1 capsule b.i.d. 18.Centrum Silver 1 p.o. daily. 19.Omeprazole 20 mg p.o. daily at bedtime. 20.OxyIR 5 mg 1-2 p.o. q.6 hours as needed for breakthrough pain. 21.Percocet 5/325 one to two p.o. q.6 hours as needed for pain. 22.Systane 0.4 to 0.3% solution place 2 drops in both eyes 2 times     daily.  FOLLOWUP:  The patient will follow up with Dr. Marcelino Scot in 14 days for re- evaluation.  Phone number is [phone].  DISCHARGE INSTRUCTIONS AND PLAN:  The patient has a chronic injury to her right leg.  We are hopeful that by opening her nonunion site, grafting it, and using a larger nail for better biomechanics, we will allow her to go.  The patient will be nonweightbearing for 6-8 weeks. She has unrestricted range of motion of her hip, knee, and ankle on the right leg.  She will work with therapy on a regular basis.  She will remain on Lovenox for DVT and PE prophylaxis for 4 weeks as well.  Ice and elevate as needed for pain and swelling control.  TED hose for swelling control.  Pain control with Percocet, OxyIR, and Robaxin.  The patient will remain on her carbohydrate-modified diet and will continue to optimize diabetes control.  She will remain on vitamin D daily as well, but these labs look good and  she should have been necessary building blocks to heal this fracture.  We will check the patient back in 2 weeks for re-evaluation, followup x-rays, and removal of her  sutures.  Should the facility have any questions or concerns, they are encouraged to contact the office at [phone].     [name], PA-C   ______________________________ Astrid Divine. Marcelino Scot, M.D.    KWP/MEDQ  D:  01/11/2014  T:  01/11/2014  Job:  [id number]

## 2014-01-11 NOTE — Progress Notes (Signed)
Orthopaedic Trauma Service Progress Note  Subjective  Doing much better today Pain well controlled R leg sore Sat in chair for several hours yesterday   Review of Systems  Constitutional: Negative for fever and chills.  Eyes: Negative for blurred vision.  Respiratory: Negative for shortness of breath and wheezing.   Cardiovascular: Negative for chest pain and palpitations.  Gastrointestinal: Negative for nausea, vomiting and abdominal pain.  Genitourinary: Negative for dysuria.  Neurological: Negative for tingling and sensory change.      Objective   BP 114/42  Pulse 86  Temp(Src) 98.3 F (36.8 C) (Oral)  Resp 16  Ht 5\' 2"  (1.575 m)  Wt 130.863 kg (288 lb 8 oz)  BMI 52.75 kg/m2  SpO2 95%  Intake/Output     08/27 0701 - 08/28 0700 08/28 0701 - 08/29 0700   P.O. 75    I.V. (mL/kg)     Blood     Total Intake(mL/kg) 75 (0.6)    Urine (mL/kg/hr) 1725 (0.5)    Total Output 1725     Net -1650            Labs  Results for Mark, MARIESA GRIEDER (MRN [id number]) as of 01/11/2014 08:16  Ref. Range 01/11/2014 05:08  WBC Latest Range: 4.0-10.5 K/uL 10.9 (H)  RBC Latest Range: 3.87-5.11 MIL/uL 3.31 (L)  Hemoglobin Latest Range: 12.0-15.0 g/dL 9.3 (L)  HCT Latest Range: 36.0-46.0 % 28.8 (L)  MCV Latest Range: 78.0-100.0 fL 87.0  MCH Latest Range: 26.0-34.0 pg 28.1  MCHC Latest Range: 30.0-36.0 g/dL 32.3  RDW Latest Range: 11.5-15.5 % 14.7  Platelets Latest Range: 150-400 K/uL 319   CBG (last 3)   Recent Labs  01/10/14 1147 01/10/14 1624 01/11/14 0724  GLUCAP 122* 129* 111*      Exam  Gen: awake and alert, comfortable appearing, lying in bed Lungs: clear anterior fields Cardiac: RRR, s1 and s2 Abd: + BS, NTND, obese Ext:       Right Lower Extremity               op wounds look great, scant sanguinous drainage, no signs of infection   R heel looks good, no areas of pressure noted              Ext warm             + DP pulse             No dct                Distal motor and sensory functions intact             Swelling stable   Assessment and Plan   POD/HD#: 38   63 y/o female 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 R subtrochanteric femur nonunion s/p IMN revision + allografting             NWB R leg x 6 -8 weeks             ROM R hip, knee and ankle as tolerated             Ice and elevate             PT/OT                 Dressing changed today  Dressing changes prn  TED hose   Stable for SNF  2. Pain management:             Percocet 5/325 1-2 q6h prn             Oxy IR 5mg  1-2 q3h prn             Robaxin [phone] mg q6h prn             Gabapentin   3. ABL anemia/Hemodynamics                          stable  Received 3 units total during hospital stay   4. Medical issues               COPD- home meds             DM-                          Restart metformin    Dc ssi              HTN- lisinopril on hold for now, BP soft               GERD- home meds                                        Depression/anxiety/bipolar- home meds   4. DVT/PE prophylaxis:             Lovenox             Will continue lovenox a dc for a total of 4 weeks   5. ID:               periop ancef   6. Metabolic Bone Disease:             outpt workup fairly unremarkable             vitamin D normal at 51               Optimize nutrition               Hgba1c elevated, cbgs have been good                 7. Activity:             See #1             PT/OT  8. FEN/Foley/Lines:             Advance diet as tolerated to CHO mod           dc IV and IVF    9. Impediments to fracture healing:             Long term fosamax use (has been off about 5 months)             Chronicity of nonunion               DM             Obesity   10. Morbid obesity                11. Dispo:            ready for SNF when bed available     [name], PA-C [facility] [phone]  (P) 01/11/2014 8:15 AM  **  Disclaimer: This note may have been dictated with voice recognition software. Similar sounding words can inadvertently be transcribed and this note may contain transcription errors which may not have been corrected upon publication of note.**

## 2014-01-11 NOTE — Discharge Summary (Signed)
[facility] (OTS)  Patient ID: [name] [name] MRN: [id number] DOB/AGE: November 18, 1950 63 y.o.  Admit date: 01/08/2014 Discharge date: 01/11/2014  Full discharge summary dictated: #[id number]  Admission Diagnoses: Right subtrochanteric femoral nonunion Morbid obesity COPD Hypertension GERD Depression Bipolar disorder Anxiety  Discharge Diagnoses:  Principal Problem:   Nonunion, fracture- R subtrochanteric femoral nonunion  Active Problems:   Acute blood loss anemia   Morbid obesity with BMI of 45.0-49.9, adult   COPD (chronic obstructive pulmonary disease)   Hypertension   GERD (gastroesophageal reflux disease)   Depression   Bipolar disorder   Anxiety   Procedures Performed: 01/08/2014- Dr. Marcelino Scot 1. Intramedullary nailing of the right hip using a Synthes Recon nail,     16 x 320 statically locked. 2. Open repair of right femoral nonunion with removal of fibrous scar     tissue, mobilization, and reduction into valgus of the fracture and     INFUSE and cancellous allografting. 3. Removal of femoral nail   Discharged Condition: good  Hospital Course: See dictation  Consults: None  Significant Diagnostic Studies: labs:  Results for Packham, TIFFNAY BOSSI (MRN [id number]) as of 01/11/2014 09:28  Ref. Range 01/11/2014 05:08  WBC Latest Range: 4.0-10.5 K/uL 10.9 (H)  RBC Latest Range: 3.87-5.11 MIL/uL 3.31 (L)  Hemoglobin Latest Range: 12.0-15.0 g/dL 9.3 (L)  HCT Latest Range: 36.0-46.0 % 28.8 (L)  MCV Latest Range: 78.0-100.0 fL 87.0  MCH Latest Range: 26.0-34.0 pg 28.1  MCHC Latest Range: 30.0-36.0 g/dL 32.3  RDW Latest Range: 11.5-15.5 % 14.7  Platelets Latest Range: 150-400 K/uL 319   Results for Gauger, TRIS HOWELL (MRN [id number]) as of 01/11/2014 09:28  Ref. Range 01/09/2014 05:02  Hemoglobin A1C Latest Range: <5.7 % 6.4 (H)   Results for Sheetz, MELIANA CANNER (MRN [id number]) as of 01/11/2014 09:28  Ref. Range 01/09/2014 05:02  Vit D, 25-Hydroxy Latest Range: 30-89  ng/mL 51  Results for Bramble, SHERRICE CREEKMORE (MRN [id number]) as of 01/11/2014 09:28  Ref. Range 01/09/2014 05:02  Sodium Latest Range: 137-147 mEq/L 139  Potassium Latest Range: 3.7-5.3 mEq/L 4.7  Chloride Latest Range: 96-112 mEq/L 104  CO2 Latest Range: 19-32 mEq/L 26  Mean Plasma Glucose Latest Range: <117 mg/dL 137 (H)  BUN Latest Range: 6-23 mg/dL 13  Creatinine Latest Range: 0.50-1.10 mg/dL 0.96  Calcium Latest Range: 8.4-10.5 mg/dL 8.9  GFR calc non Af Amer Latest Range: >90 mL/min 61 (L)  GFR calc Af Amer Latest Range: >90 mL/min 71 (L)  Glucose Latest Range: 70-99 mg/dL 135 (H)  Anion gap Latest Range: 5-15  9   Treatments: IV hydration, antibiotics: Ancef, analgesia: Dilaudid, Percocet and OxyIR, anticoagulation: LMW heparin, insulin: NovoLog sliding scale , therapies: PT, OT, RN and SW and surgery: As above  Discharge Exam:  [facility] Progress Note  Subjective  Doing much better today Pain well controlled R leg sore Sat in chair for several hours yesterday   Review of Systems  Constitutional: Negative for fever and chills.  Eyes: Negative for blurred vision.  Respiratory: Negative for shortness of breath and wheezing.   Cardiovascular: Negative for chest pain and palpitations.  Gastrointestinal: Negative for nausea, vomiting and abdominal pain.  Genitourinary: Negative for dysuria.  Neurological: Negative for tingling and sensory change.     Objective   BP 114/42  Pulse 86  Temp(Src) 98.3 F (36.8 C) (Oral)  Resp 16  Ht 5\' 2"  (1.575 m)  Wt 130.863 kg (288 lb 8 oz)  BMI 52.75 kg/m2  SpO2 95%  Intake/Output     08/27 0701 - 08/28 0700 08/28 0701 - 08/29 0700    P.O. 75     I.V. (mL/kg)      Blood      Total Intake(mL/kg) 75 (0.6)     Urine (mL/kg/hr) 1725 (0.5)     Total Output 1725      Net -1650              Labs  Results for Urwin, SHAKEA ISIP (MRN [id number]) as of 01/11/2014 08:16   Ref. Range  01/11/2014 05:08   WBC  Latest Range:  4.0-10.5 K/uL  10.9 (H)   RBC  Latest Range: 3.87-5.11 MIL/uL  3.31 (L)   Hemoglobin  Latest Range: 12.0-15.0 g/dL  9.3 (L)   HCT  Latest Range: 36.0-46.0 %  28.8 (L)   MCV  Latest Range: 78.0-100.0 fL  87.0   MCH  Latest Range: 26.0-34.0 pg  28.1   MCHC  Latest Range: 30.0-36.0 g/dL  32.3   RDW  Latest Range: 11.5-15.5 %  14.7   Platelets  Latest Range: 150-400 K/uL  319    CBG (last 3)   Recent Labs   01/10/14 1147  01/10/14 1624  01/11/14 0724   GLUCAP  122*  129*  111*       Exam  Gen: awake and alert, comfortable appearing, lying in bed Lungs: clear anterior fields Cardiac: RRR, s1 and s2 Abd: + BS, NTND, obese Ext:        Right Lower Extremity               op wounds look great, scant sanguinous drainage, no signs of infection               R heel looks good, no areas of pressure noted               Ext warm             + DP pulse             No dct               Distal motor and sensory functions intact             Swelling stable   Assessment and Plan   POD/HD#: 34   63 y/o female 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 R subtrochanteric femur nonunion s/p IMN revision + allografting             NWB R leg x 6 -8 weeks             ROM R hip, knee and ankle as tolerated             Ice and elevate             PT/OT                 Dressing changed today             Dressing changes prn             TED hose               Stable for SNF                 2. Pain management:             Percocet 5/325 1-2 q6h prn  Oxy IR 5mg  1-2 q3h prn             Robaxin [phone] mg q6h prn             Gabapentin   3. ABL anemia/Hemodynamics                          stable             Received 3 units total during hospital stay   4. Medical issues               COPD- home meds             DM-                          Restart metformin                           Dc ssi               HTN- lisinopril on hold for now, BP soft                GERD- home meds                                        Depression/anxiety/bipolar- home meds   4. DVT/PE prophylaxis:             Lovenox             Will continue lovenox a dc for a total of 4 weeks   5. ID:               periop ancef   6. Metabolic Bone Disease:             outpt workup fairly unremarkable             vitamin D normal at 51               Optimize nutrition               Hgba1c elevated, cbgs have been good                 7. Activity:             See #1             PT/OT  8. FEN/Foley/Lines:             Advance diet as tolerated to CHO mod           dc IV and IVF    9. Impediments to fracture healing:             Long term fosamax use (has been off about 5 months)             Chronicity of nonunion               DM             Obesity   10. Morbid obesity                11. Dispo:            ready for SNF when bed available      [name], PA-C [facility] [phone]  P) 01/11/2014 8:15 AM  **Disclaimer: This note may have been dictated with voice recognition software. Similar sounding words can inadvertently be transcribed and this note may contain transcription errors which may not have been corrected upon publication of note.**      Disposition: 03-Skilled Nursing Facility  Discharge Instructions   Active range of motion    Complete by:  As directed   Unrestricted range of motion right hip, knee and ankle Okay to work on ankle Thera-Band exercises, stretching exercises. Only restriction is nonweightbearing right leg     Apply ice to affected area    Complete by:  As directed   Ice as needed to right leg for swelling and pain control.     Call MD / Call 911    Complete by:  As directed   If you experience chest pain or shortness of breath, CALL 911 and be transported to the hospital emergency room.  If you develope a fever above 101 F, pus (white drainage) or increased drainage or redness at the wound, or  calf pain, call your surgeon's office.     Constipation Prevention    Complete by:  As directed   Drink plenty of fluids.  Prune juice may be helpful.  You may use a stool softener, such as Colace (over the counter) 100 mg twice a day.  Use MiraLax (over the counter) for constipation as needed.     Diet Carb Modified    Complete by:  As directed      Discharge instructions    Complete by:  As directed   [facility] Discharge Instructions   General Discharge Instructions  WEIGHT BEARING STATUS: Nonweightbearing right leg  RANGE OF MOTION/ACTIVITY: Range of motion tolerated right hip, knee and ankle  Wound care: Daily wound care changes with dry dressings, see instructions below  Diet: Carbohydrate modified diet.  Can use over the counter stool softeners and bowel preparations, such as Miralax, to help with bowel movements.  Narcotics can be constipating.  Be sure to drink plenty of fluids  STOP SMOKING OR USING NICOTINE PRODUCTS!!!!  As discussed nicotine severely impairs your body's ability to heal surgical and traumatic wounds but also impairs bone healing.  Wounds and bone heal by forming microscopic blood vessels (angiogenesis) and nicotine is a vasoconstrictor (essentially, shrinks blood vessels).  Therefore, if vasoconstriction occurs to these microscopic blood vessels they essentially disappear and are unable to deliver necessary nutrients to the healing tissue.  This is one modifiable factor that you can do to dramatically increase your chances of healing your injury.    (This means no smoking, no nicotine gum, patches, etc)  DO NOT USE NONSTEROIDAL ANTI-INFLAMMATORY DRUGS (NSAID'S)  Using products such as Advil (ibuprofen), Aleve (naproxen), Motrin (ibuprofen) for additional pain control during fracture healing can delay and/or prevent the healing response.  If you would like to take over the counter (OTC) medication, Tylenol (acetaminophen) is ok.  However, some  narcotic medications that are given for pain control contain acetaminophen as well. Therefore, you should not exceed more than 4000 mg of tylenol in a day if you do not have liver disease.  Also note that there are may OTC medicines, such as cold medicines and allergy medicines that my contain tylenol as well.  If you have any questions about medications and/or interactions please ask your doctor/PA or your pharmacist.   PAIN MEDICATION USE AND EXPECTATIONS  You have likely been given narcotic medications to help control your  pain.  After a traumatic event that results in an fracture (broken bone) with or without surgery, it is ok to use narcotic pain medications to help control one's pain.  We understand that everyone responds to pain differently and each individual patient will be evaluated on a regular basis for the continued need for narcotic medications. Ideally, narcotic medication use should last no more than 6-8 weeks (coinciding with fracture healing).   As a patient it is your responsibility as well to monitor narcotic medication use and report the amount and frequency you use these medications when you come to your office visit.   We would also advise that if you are using narcotic medications, you should take a dose prior to therapy to maximize you participation.  IF YOU ARE ON NARCOTIC MEDICATIONS IT IS NOT PERMISSIBLE TO OPERATE A MOTOR VEHICLE (MOTORCYCLE/CAR/TRUCK/MOPED) OR HEAVY MACHINERY DO NOT MIX NARCOTICS WITH OTHER CNS (CENTRAL NERVOUS SYSTEM) DEPRESSANTS SUCH AS ALCOHOL       ICE AND ELEVATE INJURED/OPERATIVE EXTREMITY  Using ice and elevating the injured extremity above your heart can help with swelling and pain control.  Icing in a pulsatile fashion, such as 20 minutes on and 20 minutes off, can be followed.    Do not place ice directly on skin. Make sure there is a barrier between to skin and the ice pack.    Using frozen items such as frozen peas works well as the conform  nicely to the are that needs to be iced.  USE AN ACE WRAP OR TED HOSE FOR SWELLING CONTROL  In addition to icing and elevation, Ace wraps or TED hose are used to help limit and resolve swelling.  It is recommended to use Ace wraps or TED hose until you are informed to stop.    When using Ace Wraps start the wrapping distally (farthest away from the body) and wrap proximally (closer to the body)   Example: If you had surgery on your leg or thing and you do not have a splint on, start the ace wrap at the toes and work your way up to the thigh        If you had surgery on your upper extremity and do not have a splint on, start the ace wrap at your fingers and work your way up to the upper arm  IF YOU ARE IN A SPLINT OR CAST DO NOT [place]   If your splint gets wet for any reason please contact the office immediately. You may shower in your splint or cast as long as you keep it dry.  This can be done by wrapping in a cast cover or garbage back (or similar)  Do Not stick any thing down your splint or cast such as pencils, money, or hangers to try and scratch yourself with.  If you feel itchy take benadryl as prescribed on the bottle for itching  IF YOU ARE IN A CAM BOOT (BLACK BOOT)  You may remove boot periodically. Perform daily dressing changes as noted below.  Wash the liner of the boot regularly and wear a sock when wearing the boot. It is recommended that you sleep in the boot until told otherwise  CALL THE OFFICE WITH ANY QUESTIONS OR CONCERTS: [phone]     Discharge Pin Site Instructions  Dress pins daily with Kerlix roll starting on POD 2. Wrap the Kerlix so that it tamps the skin down around the pin-skin interface to prevent/limit motion of  the skin relative to the pin.  (Pin-skin motion is the primary cause of pain and infection related to external fixator pin sites).  Remove any crust or coagulum that may obstruct drainage with a saline moistened gauze or soap and  water.  After POD 3, if there is no discernable drainage on the pin site dressing, the interval for change can by increased to every other day.  You may shower with the fixator, cleaning all pin sites gently with soap and water.  If you have a surgical wound this needs to be completely dry and without drainage before showering.  The extremity can be lifted by the fixator to facilitate wound care and transfers.  Notify the office/Doctor if you experience increasing drainage, redness, or pain from a pin site, or if you notice purulent (thick, snot-like) drainage.  Discharge Wound Care Instructions  Do NOT apply any ointments, solutions or lotions to pin sites or surgical wounds.  These prevent needed drainage and even though solutions like hydrogen peroxide kill bacteria, they also damage cells lining the pin sites that help fight infection.  Applying lotions or ointments can keep the wounds moist and can cause them to breakdown and open up as well. This can increase the risk for infection. When in doubt call the office.  Surgical incisions should be dressed daily.  If any drainage is noted, use one layer of adaptic, then gauze, Kerlix, and an ace wrap.  Once the incision is completely dry and without drainage, it may be left open to air out.  Showering may begin 36-48 hours later.  Cleaning gently with soap and water.  Traumatic wounds should be dressed daily as well.    One layer of adaptic, gauze, Kerlix, then ace wrap.  The adaptic can be discontinued once the draining has ceased    If you have a wet to dry dressing: wet the gauze with saline the squeeze as much saline out so the gauze is moist (not soaking wet), place moistened gauze over wound, then place a dry gauze over the moist one, followed by Kerlix wrap, then ace wrap.  Orthopedic injury and specific instructions   NWB R leg x 6 -8 weeks  ROM R hip, knee and ankle as tolerated  Ice and elevate  PT/OT  Dressing changes prn   TED hose  Stable for SNF     Discharge wound care:    Complete by:  As directed   Daily dry dressing changes to right leg No Ointments lotions or solutions need to be applied to the surgical incisions Okay to shower once wounds are completely dry     Do not put a pillow under the knee. Place it under the heel.    Complete by:  As directed      Elevate extremity    Complete by:  As directed   Elevate right leg above heart to help with swelling control     Increase activity slowly as tolerated    Complete by:  As directed      Non weight bearing    Complete by:  As directed   Laterality:  right  Extremity:  Lower            Medication List    STOP taking these medications       Acetaminophen 650 MG Tabs      TAKE these medications       ARIPiprazole 10 MG tablet  Commonly known as:  ABILIFY  Take 10  mg by mouth daily.     aspirin EC 81 MG tablet  Take 81 mg by mouth daily.     bisacodyl 5 MG EC tablet  Commonly known as:  DULCOLAX  Take 1 tablet (5 mg total) by mouth daily as needed for moderate constipation.     CALTRATE 600+D PO  Take 1 tablet by mouth 2 (two) times daily.     cholecalciferol 1000 UNITS tablet  Commonly known as:  VITAMIN D  Take 1,000 Units by mouth daily.     COMBIVENT RESPIMAT 20-100 MCG/ACT Aers respimat  Generic drug:  Ipratropium-Albuterol  Inhale 2 puffs into the lungs every 6 (six) hours as needed for wheezing.     DSS 100 MG Caps  Take 100 mg by mouth 2 (two) times daily.     enoxaparin 40 MG/0.4ML injection  Commonly known as:  LOVENOX  Inject 0.4 mLs (40 mg total) into the skin daily.     ferrous sulfate 325 (65 FE) MG tablet  Take 325 mg by mouth 2 (two) times daily.     Fish Oil 1200 MG Caps  Take 1,200 mg by mouth 2 (two) times daily.     gabapentin 600 MG tablet  Commonly known as:  NEURONTIN  Take 600-1,200 mg by mouth 2 (two) times daily. Take 600 mg by mouth in the morning and take 1200 mg by mouth in the  evening.     lisinopril 20 MG tablet  Commonly known as:  PRINIVIL,ZESTRIL  Take 20 mg by mouth daily before breakfast.     metFORMIN 500 MG tablet  Commonly known as:  GLUCOPHAGE  Take 500 mg by mouth 2 (two) times daily with a meal.     methocarbamol 500 MG tablet  Commonly known as:  ROBAXIN  Take 1-2 tablets (500-1,000 mg total) by mouth every 6 (six) hours as needed for muscle spasms.     mupirocin ointment 2 %  Commonly known as:  BACTROBAN  Place 1 application into the nose 2 (two) times daily.     nystatin-triamcinolone cream  Commonly known as:  MYCOLOG II  Apply 1 application topically daily.     OCUVITE EYE HEALTH FORMULA Caps  Take 1 capsule by mouth 2 (two) times daily.     CENTRUM SILVER ULTRA WOMENS Tabs  Take 1 tablet by mouth daily.     omeprazole 20 MG capsule  Commonly known as:  PRILOSEC  Take 20 mg by mouth at bedtime.     oxyCODONE 5 MG immediate release tablet  Commonly known as:  Oxy IR/ROXICODONE  Take 1-2 tablets (5-10 mg total) by mouth every 6 (six) hours as needed for breakthrough pain (take between percocet for breakthrough pain).     oxyCODONE-acetaminophen 5-325 MG per tablet  Commonly known as:  PERCOCET/ROXICET  Take 1-2 tablets by mouth every 6 (six) hours as needed for moderate pain or severe pain.     SYSTANE 0.4-0.3 % Soln  Generic drug:  Polyethyl Glycol-Propyl Glycol  Place 2 drops into both eyes 2 (two) times daily.           Follow-up Information   Follow up with HANDY,MICHAEL H, MD. Schedule an appointment as soon as possible for a visit in 14 days. (For wound re-check, For suture removal)    Specialty:  Orthopedic Surgery   Contact information:   [address] [place] [address] [phone]       Discharge Instructions and Plan: See  dictated summary  Signed:  [name], PA-C [facility] [phone] (P) 01/11/2014, 9:26 AM  **Disclaimer: This note may have been  dictated with voice recognition software. Similar sounding words can inadvertently be transcribed and this note may contain transcription errors which may not have been corrected upon publication of note.**

## 2014-01-12 LAB — VITAMIN D 1,25 DIHYDROXY
VITAMIN D 1, 25 (OH) TOTAL: 22 pg/mL (ref 18–72)
Vitamin D3 1, 25 (OH)2: 22 pg/mL

## 2014-01-13 LAB — ANAEROBIC CULTURE

## 2014-01-14 ENCOUNTER — Non-Acute Institutional Stay (SKILLED_NURSING_FACILITY): Payer: Medicare Other | Admitting: Internal Medicine

## 2014-01-14 ENCOUNTER — Encounter: Payer: Self-pay | Admitting: Internal Medicine

## 2014-01-14 DIAGNOSIS — F3175 Bipolar disorder, in partial remission, most recent episode depressed: Secondary | ICD-10-CM

## 2014-01-14 DIAGNOSIS — J449 Chronic obstructive pulmonary disease, unspecified: Secondary | ICD-10-CM

## 2014-01-14 DIAGNOSIS — K219 Gastro-esophageal reflux disease without esophagitis: Secondary | ICD-10-CM

## 2014-01-14 DIAGNOSIS — D62 Acute posthemorrhagic anemia: Secondary | ICD-10-CM

## 2014-01-14 DIAGNOSIS — Z6841 Body Mass Index (BMI) 40.0 and over, adult: Secondary | ICD-10-CM

## 2014-01-14 DIAGNOSIS — I10 Essential (primary) hypertension: Secondary | ICD-10-CM

## 2014-01-14 DIAGNOSIS — IMO0002 Reserved for concepts with insufficient information to code with codable children: Secondary | ICD-10-CM

## 2014-01-14 NOTE — Progress Notes (Signed)
MRN: [id number] Name: [name] [name]  Sex: female Age: 63 y.o. DOB: January 16, 1951  [place] #: [facility] Facility/Room: 223A Level Of Care: SNF Provider: Inocencio Homes D Emergency Contacts: Extended Emergency Contact Information Primary Emergency Contact: [place] [facility] [place] Phone: [phone] Relation: Sister  Code Status: FULL  Allergies: Coffee flavor; Coffea arabica; Prednisone; and Other  Chief Complaint  Patient presents with  . New Admit To SNF    HPI: Patient is 63 y.o. female who is admitted to SNF after revision for non union of hip fx.  Past Medical History  Diagnosis Date  . COPD (chronic obstructive pulmonary disease)   . Shortness of breath   . Arthritis   . Anemia   . Diabetes mellitus   . Hypertension   . Peripheral neuropathy   . Hyperlipemia   . Hearing loss   . Memory loss   . Pneumonia   . Bronchitis   . GERD (gastroesophageal reflux disease)   . Encounter for blood transfusion   . Skin yeast infection     history of under bilateral breast,, pannus, inner legs  . Depression   . Bipolar disorder   . Anxiety   . Nonunion, 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    Past Surgical History  Procedure Laterality Date  . Tonsillectomy    . Femur fx    . Hip fracture surgery    . Esophagogastroduodenoscopy  10/22/2011    Procedure: ESOPHAGOGASTRODUODENOSCOPY (EGD);  Surgeon: Beryle Beams, MD;  Location: [facility];  Service: Endoscopy;  Laterality: N/A;  . Colonoscopy  10/22/2011    Procedure: COLONOSCOPY;  Surgeon: Beryle Beams, MD;  Location: [facility];  Service: Endoscopy;  Laterality: N/A;  . Hardware removal  03/27/2012    Procedure: HARDWARE REMOVAL;  Surgeon: Mauri Pole, MD;  Location: [facility];  Service: Orthopedics;  Laterality: Right;  . Intramedullary (im) nail intertrochanteric  03/27/2012    Procedure: INTRAMEDULLARY (IM) NAIL INTERTROCHANTRIC;  Surgeon: Mauri Pole, MD;  Location: [facility];  Service: Orthopedics;  Laterality: Right;  Possible Exchange with a Shorter Trochantric Nail  . Femur im nail Right 01/08/2014    Procedure: INTRAMEDULLARY (IM) NAIL RIGHT HIP WITH INFUSED ALLOGRAFTING;  Surgeon: Rozanna Box, MD;  Location: [facility];  Service: Orthopedics;  Laterality: Right;  . Hardware removal Right 01/08/2014    Procedure: HARDWARE REMOVAL RIGHT PROXIMAL FEMUR;  Surgeon: Rozanna Box, MD;  Location: [facility];  Service: Orthopedics;  Laterality: Right;      Medication List       This list is accurate as of: 01/14/14 11:59 PM.  Always use your most recent med list.               ARIPiprazole 10 MG tablet  Commonly known as:  ABILIFY  Take 10 mg by mouth daily.     aspirin EC 81 MG tablet  Take 81 mg by mouth daily.     bisacodyl 5 MG EC tablet  Commonly known as:  DULCOLAX  Take 1 tablet (5 mg total) by mouth daily as needed for moderate constipation.     CALTRATE 600+D PO  Take 1 tablet by mouth 2 (two) times daily.     cholecalciferol 1000 UNITS tablet  Commonly known as:  VITAMIN D  Take 1,000 Units by mouth daily.     COMBIVENT RESPIMAT 20-100 MCG/ACT Aers respimat  Generic drug:  Ipratropium-Albuterol  Inhale 2 puffs into the lungs every  6 (six) hours as needed for wheezing.     DSS 100 MG Caps  Take 100 mg by mouth 2 (two) times daily.     enoxaparin 40 MG/0.4ML injection  Commonly known as:  LOVENOX  Inject 0.4 mLs (40 mg total) into the skin daily.     ferrous sulfate 325 (65 FE) MG tablet  Take 325 mg by mouth 2 (two) times daily.     Fish Oil 1200 MG Caps  Take 1,200 mg by mouth 2 (two) times daily.     gabapentin 600 MG tablet  Commonly known as:  NEURONTIN  Take 600-1,200 mg by mouth 2 (two) times daily. Take 600 mg by mouth in the morning and take 1200 mg by mouth in the evening.     lisinopril 20 MG tablet  Commonly known as:  PRINIVIL,ZESTRIL  Take 20 mg by mouth daily before breakfast.     metFORMIN 500 MG tablet   Commonly known as:  GLUCOPHAGE  Take 500 mg by mouth 2 (two) times daily with a meal.     methocarbamol 500 MG tablet  Commonly known as:  ROBAXIN  Take 1-2 tablets (500-1,000 mg total) by mouth every 6 (six) hours as needed for muscle spasms.     mupirocin ointment 2 %  Commonly known as:  BACTROBAN  Place 1 application into the nose 2 (two) times daily.     nystatin-triamcinolone cream  Commonly known as:  MYCOLOG II  Apply 1 application topically daily.     OCUVITE EYE HEALTH FORMULA Caps  Take 1 capsule by mouth 2 (two) times daily.     CENTRUM SILVER ULTRA WOMENS Tabs  Take 1 tablet by mouth daily.     omeprazole 20 MG capsule  Commonly known as:  PRILOSEC  Take 20 mg by mouth at bedtime.     oxyCODONE 5 MG immediate release tablet  Commonly known as:  Oxy IR/ROXICODONE  Take 1-2 tablets (5-10 mg total) by mouth every 6 (six) hours as needed for breakthrough pain (take between percocet for breakthrough pain).     oxyCODONE-acetaminophen 5-325 MG per tablet  Commonly known as:  PERCOCET/ROXICET  Take 1-2 tablets by mouth every 6 (six) hours as needed for moderate pain or severe pain.     SYSTANE 0.4-0.3 % Soln  Generic drug:  Polyethyl Glycol-Propyl Glycol  Place 2 drops into both eyes 2 (two) times daily.        No orders of the defined types were placed in this encounter.     There is no immunization history on file for this patient.  History  Substance Use Topics  . Smoking status: Former Smoker    Quit date: 01/22/2008  . Smokeless tobacco: Never Used  . Alcohol Use: No    Family history is noncontributory    Review of Systems  DATA OBTAINED: from patient GENERAL: Feels well no fevers, fatigue, appetite changes SKIN: No itching, rash or wounds EYES: No eye pain, redness, discharge EARS: No earache, tinnitus, change in hearing NOSE: No congestion, drainage or bleeding  MOUTH/THROAT: No mouth or tooth pain RESPIRATORY: No cough, wheezing,  SOB CARDIAC: No chest pain, palpitations, lower extremity edema  GI: No abdominal pain, No N/V/D or constipation, No heartburn or reflux  GU: No dysuria, frequency or urgency, or incontinence  MUSCULOSKELETAL: No unrelieved bone/joint pain NEUROLOGIC: No headache, dizziness PSYCHIATRIC: No overt anxiety or sadness. Sleeps well. No behavior issue.   Filed Vitals:   01/14/14 1634  BP: 123/69  Pulse: 80  Temp: 98.9 F (37.2 C)  Resp: 19    Physical Exam  GENERAL APPEARANCE: Alert, conversant. Appropriately groomed. No acute distress.  SKIN: No diaphoresis rash HEAD: Normocephalic, atraumatic  EYES: Conjunctiva/lids clear. Pupils round, reactive. EOMs intact.  EARS: External exam WNL, canals clear. Hearing grossly normal.  NOSE: No deformity or discharge.  MOUTH/THROAT: Lips w/o lesions  RESPIRATORY: Breathing is even, unlabored. Lung sounds are clear   CARDIOVASCULAR: Heart RRR no murmurs, rubs or gallops. No peripheral edema.  GASTROINTESTINAL: Abdomen is soft, non-tender, not distended w/ normal bowel sounds GENITOURINARY: Bladder non tender, not distended  MUSCULOSKELETAL: No abnormal joints or musculature NEUROLOGIC: . Cranial nerves 2-12 grossly intact. Moves all extremities no tremor. PSYCHIATRIC: Mood and affect appropriate to situation, no behavioral issues  Patient Active Problem List   Diagnosis Date Noted  . Nonunion, 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  . COPD (chronic obstructive pulmonary disease)   . Hypertension   . GERD (gastroesophageal reflux disease)   . Depression   . Bipolar disorder   . Anxiety   . Acute blood loss anemia 03/28/2012  . Morbid obesity with BMI of 45.0-49.9, adult 03/28/2012  . Hyponatremia 03/28/2012  . Retained hardware right hip 03/28/2012    CBC    Component Value Date/Time   WBC 10.9* 01/11/2014 0508   RBC 3.31* 01/11/2014 0508   HGB 9.3* 01/11/2014 0508   HCT 28.8* 01/11/2014 0508   PLT 319 01/11/2014  0508   MCV 87.0 01/11/2014 0508   LYMPHSABS 1.7 01/08/2014 0658   MONOABS 0.6 01/08/2014 0658   EOSABS 0.2 01/08/2014 0658   BASOSABS 0.0 01/08/2014 0658    CMP     Component Value Date/Time   NA 139 01/09/2014 0502   K 4.7 01/09/2014 0502   CL 104 01/09/2014 0502   CO2 26 01/09/2014 0502   GLUCOSE 135* 01/09/2014 0502   BUN 13 01/09/2014 0502   CREATININE 0.96 01/09/2014 0502   CALCIUM 8.9 01/09/2014 0502   PROT 7.3 01/08/2014 0658   ALBUMIN 3.1* 01/08/2014 0658   AST 16 01/08/2014 0658   ALT 14 01/08/2014 0658   ALKPHOS 72 01/08/2014 0658   BILITOT <0.2* 01/08/2014 0658   GFRNONAA 61* 01/09/2014 0502   GFRAA 71* 01/09/2014 0502    Assessment and Plan  Nonunion, fracture- R subtrochanteric femoral nonunion  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

## 2014-01-15 ENCOUNTER — Encounter (HOSPITAL_COMMUNITY): Payer: Self-pay | Admitting: Orthopedic Surgery

## 2014-01-16 ENCOUNTER — Encounter: Payer: Self-pay | Admitting: Internal Medicine

## 2014-01-16 NOTE — Assessment & Plan Note (Signed)
PRN  MDI

## 2014-01-16 NOTE — Assessment & Plan Note (Signed)
Cont lisinopril 20 mg

## 2014-01-16 NOTE — Assessment & Plan Note (Signed)
Diabetic weight loss diet

## 2014-01-16 NOTE — Assessment & Plan Note (Signed)
Continue abilify  

## 2014-01-16 NOTE — Assessment & Plan Note (Addendum)
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

## 2014-01-16 NOTE — Assessment & Plan Note (Signed)
Continue prilosec

## 2014-01-16 NOTE — Assessment & Plan Note (Signed)
stable  Received 3 units total during hospital stay  Continue iron

## 2014-02-15 ENCOUNTER — Non-Acute Institutional Stay (SKILLED_NURSING_FACILITY): Payer: Medicare Other | Admitting: Nurse Practitioner

## 2014-02-15 DIAGNOSIS — S72001K Fracture of unspecified part of neck of right femur, subsequent encounter for closed fracture with nonunion: Secondary | ICD-10-CM

## 2014-02-15 DIAGNOSIS — J449 Chronic obstructive pulmonary disease, unspecified: Secondary | ICD-10-CM

## 2014-02-15 DIAGNOSIS — E119 Type 2 diabetes mellitus without complications: Secondary | ICD-10-CM

## 2014-02-15 DIAGNOSIS — I10 Essential (primary) hypertension: Secondary | ICD-10-CM

## 2014-02-15 DIAGNOSIS — F3175 Bipolar disorder, in partial remission, most recent episode depressed: Secondary | ICD-10-CM

## 2014-02-15 NOTE — Progress Notes (Signed)
Patient ID: [name] [name], female   DOB: 29-May-1950, 63 y.o.   MRN: [id number]    Nursing Home Location:  [place] of Service: SNF (31)  PCP: Bing Matter, PA-C  Allergies  Allergen Reactions  . Coffee Flavor Nausea And Vomiting    Per pt she is "allergic" to the smell of coffee  . Coffea Arabica Nausea And Vomiting  . Prednisone Other (See Comments)    Tightness in chest  . Other Other (See Comments)    Plain nystatin ointment-makes skin get worse    Chief Complaint  Patient presents with  . Medical Management of Chronic Issues    HPI:  Patient is 63 y.o. female who is admitted to SNF after revision for non union of hip fx. Has been doing well with therapy however limited due to non-weight bearing. Has follow up with ortho scheduled. Pain well controlled. Denies constipation. Without complaints and nursing without concerns at this time.   Review of Systems:  Review of Systems  Constitutional: Negative for activity change, appetite change, fatigue and unexpected weight change.  HENT: Negative.   Eyes: Negative.   Respiratory: Negative for cough and shortness of breath.   Cardiovascular: Negative for chest pain, palpitations and leg swelling.  Gastrointestinal: Negative for abdominal pain, diarrhea and constipation.  Genitourinary: Negative for dysuria and difficulty urinating.  Musculoskeletal: Negative for arthralgias and myalgias.       Pain controlled  Skin: Negative for color change and wound.  Neurological: Negative for dizziness and weakness.  Psychiatric/Behavioral: Negative for behavioral problems, confusion and agitation.    Past Medical History  Diagnosis Date  . COPD (chronic obstructive pulmonary disease)   . Shortness of breath   . Arthritis   . Anemia   . Diabetes mellitus   . Hypertension   . Peripheral neuropathy   . Hyperlipemia   . Hearing loss   . Memory loss   . Pneumonia   . Bronchitis   . GERD (gastroesophageal  reflux disease)   . Encounter for blood transfusion   . Skin yeast infection     history of under bilateral breast,, pannus, inner legs  . Depression   . Bipolar disorder   . Anxiety   . Nonunion, 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   Past Surgical History  Procedure Laterality Date  . Tonsillectomy    . Femur fx    . Hip fracture surgery    . Esophagogastroduodenoscopy  10/22/2011    Procedure: ESOPHAGOGASTRODUODENOSCOPY (EGD);  Surgeon: Beryle Beams, MD;  Location: [facility];  Service: Endoscopy;  Laterality: N/A;  . Colonoscopy  10/22/2011    Procedure: COLONOSCOPY;  Surgeon: Beryle Beams, MD;  Location: [facility];  Service: Endoscopy;  Laterality: N/A;  . Hardware removal  03/27/2012    Procedure: HARDWARE REMOVAL;  Surgeon: Mauri Pole, MD;  Location: [facility];  Service: Orthopedics;  Laterality: Right;  . Intramedullary (im) nail intertrochanteric  03/27/2012    Procedure: INTRAMEDULLARY (IM) NAIL INTERTROCHANTRIC;  Surgeon: Mauri Pole, MD;  Location: [facility];  Service: Orthopedics;  Laterality: Right;  Possible Exchange with a Shorter Trochantric Nail  . Femur im nail Right 01/08/2014    Procedure: INTRAMEDULLARY (IM) NAIL RIGHT HIP WITH INFUSED ALLOGRAFTING;  Surgeon: Rozanna Box, MD;  Location: [facility];  Service: Orthopedics;  Laterality: Right;  . Hardware removal Right 01/08/2014    Procedure: HARDWARE REMOVAL RIGHT PROXIMAL FEMUR;  Surgeon: Rozanna Box, MD;  Location: [facility];  Service: Orthopedics;  Laterality: Right;   Social History:   reports that she quit smoking about 6 years ago. She has never used smokeless tobacco. She reports that she does not drink alcohol or use illicit drugs.  No family history on file.  Medications: Patient's Medications  New Prescriptions   No medications on file  Previous Medications   ARIPIPRAZOLE (ABILIFY) 10 MG TABLET    Take 10 mg by mouth daily.   ASPIRIN EC 81 MG TABLET    Take 81 mg by mouth  daily.   BISACODYL (DULCOLAX) 5 MG EC TABLET    Take 1 tablet (5 mg total) by mouth daily as needed for moderate constipation.   CALCIUM CARBONATE-VITAMIN D (CALTRATE 600+D PO)    Take 1 tablet by mouth 2 (two) times daily.    CHOLECALCIFEROL (VITAMIN D) 1000 UNITS TABLET    Take 1,000 Units by mouth daily.   DOCUSATE SODIUM 100 MG CAPS    Take 100 mg by mouth 2 (two) times daily.   FERROUS SULFATE 325 (65 FE) MG TABLET    Take 325 mg by mouth 2 (two) times daily.   GABAPENTIN (NEURONTIN) 600 MG TABLET    Take 600-1,200 mg by mouth 2 (two) times daily. Take 600 mg by mouth in the morning and take 1200 mg by mouth in the evening.   IPRATROPIUM-ALBUTEROL (COMBIVENT RESPIMAT) 20-100 MCG/ACT AERS RESPIMAT    Inhale 2 puffs into the lungs every 6 (six) hours as needed for wheezing.   LISINOPRIL (PRINIVIL,ZESTRIL) 20 MG TABLET    Take 20 mg by mouth daily before breakfast.    METFORMIN (GLUCOPHAGE) 500 MG TABLET    Take 500 mg by mouth 2 (two) times daily with a meal.   METHOCARBAMOL (ROBAXIN) 500 MG TABLET    Take 1-2 tablets (500-1,000 mg total) by mouth every 6 (six) hours as needed for muscle spasms.   MULTIPLE VITAMINS-MINERALS (CENTRUM SILVER ULTRA WOMENS) TABS    Take 1 tablet by mouth daily.   MULTIPLE VITAMINS-MINERALS (OCUVITE EYE HEALTH FORMULA) CAPS    Take 1 capsule by mouth 2 (two) times daily.    MUPIROCIN OINTMENT (BACTROBAN) 2 %    Place 1 application into the nose 2 (two) times daily.   NYSTATIN-TRIAMCINOLONE (MYCOLOG II) CREAM    Apply 1 application topically daily.    OMEGA-3 FATTY ACIDS (FISH OIL) 1200 MG CAPS    Take 1,200 mg by mouth 2 (two) times daily.   OMEPRAZOLE (PRILOSEC) 20 MG CAPSULE    Take 20 mg by mouth at bedtime.    OXYCODONE (OXY IR/ROXICODONE) 5 MG IMMEDIATE RELEASE TABLET    Take 1-2 tablets (5-10 mg total) by mouth every 6 (six) hours as needed for breakthrough pain (take between percocet for breakthrough pain).   OXYCODONE-ACETAMINOPHEN (PERCOCET/ROXICET) 5-325 MG  PER TABLET    Take 1-2 tablets by mouth every 6 (six) hours as needed for moderate pain or severe pain.   POLYETHYL GLYCOL-PROPYL GLYCOL (SYSTANE) 0.4-0.3 % SOLN    Place 2 drops into both eyes 2 (two) times daily.   Modified Medications   No medications on file  Discontinued Medications   ENOXAPARIN (LOVENOX) 40 MG/0.4ML INJECTION    Inject 0.4 mLs (40 mg total) into the skin daily.     Physical Exam: Filed Vitals:   02/15/14 1444  BP: 126/73  Pulse: 77  Temp: 97 F (36.1 C)  Resp: 20    Physical Exam  Constitutional: She  is oriented to person, place, and time. She appears well-developed and well-nourished. No distress.  HENT:  Head: Normocephalic and atraumatic.  Mouth/Throat: Oropharynx is clear and moist. No oropharyngeal exudate.  Eyes: Conjunctivae are normal. Pupils are equal, round, and reactive to light.  Neck: Normal range of motion. Neck supple.  Cardiovascular: Normal rate, regular rhythm and normal heart sounds.   Pulmonary/Chest: Effort normal and breath sounds normal.  Abdominal: Soft. Bowel sounds are normal.  Musculoskeletal: She exhibits no edema and no tenderness.  Neurological: She is alert and oriented to person, place, and time.  Skin: Skin is warm and dry. She is not diaphoretic.  Psychiatric: She has a normal mood and affect.    Labs reviewed: Basic Metabolic Panel:  Recent Labs  01/02/14 1043 01/08/14 0658 01/08/14 1134 01/09/14 0502  NA 142 141 139 139  K 4.7 4.4 4.4 4.7  CL 102 104  --  104  CO2 26 26  --  26  GLUCOSE 111* 122* 166* 135*  BUN 21 20  --  13  CREATININE 0.97 0.91  --  0.96  CALCIUM 9.9 9.5  --  8.9   Liver Function Tests:  Recent Labs  01/08/14 0658  AST 16  ALT 14  ALKPHOS 72  BILITOT <0.2*  PROT 7.3  ALBUMIN 3.1*   No results found for this basename: LIPASE, AMYLASE,  in the last 8760 hours No results found for this basename: AMMONIA,  in the last 8760 hours CBC:  Recent Labs  01/08/14 0658   01/09/14 0502 01/10/14 0625 01/11/14 0508  WBC 8.2  < > 8.7 9.4 10.9*  NEUTROABS 5.6  --   --   --   --   HGB 9.7*  < > 7.6* 9.7* 9.3*  HCT 30.3*  < > 24.5* 29.5* 28.8*  MCV 84.9  < > 84.2 86.3 87.0  PLT 326  < > 301 277 319  < > = values in this interval not displayed. TSH: No results found for this basename: TSH,  in the last 8760 hours A1C: Lab Results  Component Value Date   HGBA1C 6.4* 01/09/2014   Lipid Panel: No results found for this basename: CHOL, HDL, LDLCALC, TRIG, CHOLHDL, LDLDIRECT,  in the last 8760 hours    Assessment/Plan 1. Chronic obstructive pulmonary disease, unspecified COPD, unspecified chronic bronchitis type -Patient is stable; continue current regimen. Will monitor and make changes as necessary.  2. Essential hypertension Controlled at this time, conts on current medication  3. 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 Diabetes -conts on metformin  5 bipolar disorder Mood has been stable. conts on abilify

## 2014-02-20 ENCOUNTER — Other Ambulatory Visit: Payer: Self-pay | Admitting: *Deleted

## 2014-02-20 MED ORDER — OXYCODONE HCL 5 MG PO TABS: ORAL_TABLET | ORAL | Status: DC

## 2014-02-20 NOTE — Telephone Encounter (Signed)
[facility] 

## 2014-04-01 ENCOUNTER — Non-Acute Institutional Stay (SKILLED_NURSING_FACILITY): Payer: Medicare Other | Admitting: Internal Medicine

## 2014-04-01 DIAGNOSIS — I10 Essential (primary) hypertension: Secondary | ICD-10-CM

## 2014-04-01 DIAGNOSIS — E119 Type 2 diabetes mellitus without complications: Secondary | ICD-10-CM

## 2014-04-01 DIAGNOSIS — D62 Acute posthemorrhagic anemia: Secondary | ICD-10-CM

## 2014-04-01 DIAGNOSIS — J449 Chronic obstructive pulmonary disease, unspecified: Secondary | ICD-10-CM

## 2014-04-01 DIAGNOSIS — E871 Hypo-osmolality and hyponatremia: Secondary | ICD-10-CM

## 2014-04-01 DIAGNOSIS — Z6841 Body Mass Index (BMI) 40.0 and over, adult: Secondary | ICD-10-CM

## 2014-04-01 DIAGNOSIS — F3175 Bipolar disorder, in partial remission, most recent episode depressed: Secondary | ICD-10-CM

## 2014-04-01 DIAGNOSIS — G629 Polyneuropathy, unspecified: Secondary | ICD-10-CM

## 2014-04-01 DIAGNOSIS — K219 Gastro-esophageal reflux disease without esophagitis: Secondary | ICD-10-CM

## 2014-04-01 NOTE — Progress Notes (Signed)
MRN: [id number] Name: [name] [name]  Sex: female Age: 63 y.o. DOB: [birth year]  [place] #: [facility] Facility/Room: 223 Level Of Care: SNF Provider: Inocencio Homes D Emergency Contacts: Extended Emergency Contact Information Primary Emergency Contact: Antoine [place]oche [place] of [place] Phone: [phone] Relation: Sister  Code Status: FULL  Allergies: Coffee flavor; Coffea arabica; Prednisone; and Other  Chief Complaint  Patient presents with  . Medical Management of Chronic Issues    HPI: Patient is 63 y.o. female who is being seen for routine issues.  Past Medical History  Diagnosis Date  . COPD (chronic obstructive pulmonary disease)   . Shortness of breath   . Arthritis   . Anemia   . Hypertension   . Hyperlipemia   . Hearing loss   . Memory loss   . Pneumonia   . Bronchitis   . GERD (gastroesophageal reflux disease)   . Encounter for blood transfusion   . Skin yeast infection     history of under bilateral breast,, pannus, inner legs  . Depression   . Bipolar disorder   . Anxiety   . Nonunion, 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  . Diabetes mellitus   . Peripheral neuropathy     Past Surgical History  Procedure Laterality Date  . Tonsillectomy    . Femur fx    . Hip fracture surgery    . Esophagogastroduodenoscopy  10/22/2011    Procedure: ESOPHAGOGASTRODUODENOSCOPY (EGD);  Surgeon: Beryle Beams, MD;  Location: [facility];  Service: Endoscopy;  Laterality: N/A;  . Colonoscopy  10/22/2011    Procedure: COLONOSCOPY;  Surgeon: Beryle Beams, MD;  Location: [facility];  Service: Endoscopy;  Laterality: N/A;  . Hardware removal  03/27/2012    Procedure: HARDWARE REMOVAL;  Surgeon: Mauri Pole, MD;  Location: [facility];  Service: Orthopedics;  Laterality: Right;  . Intramedullary (im) nail intertrochanteric  03/27/2012    Procedure: INTRAMEDULLARY (IM) NAIL INTERTROCHANTRIC;  Surgeon: Mauri Pole, MD;  Location: [facility];   Service: Orthopedics;  Laterality: Right;  Possible Exchange with a Shorter Trochantric Nail  . Femur im nail Right 01/08/2014    Procedure: INTRAMEDULLARY (IM) NAIL RIGHT HIP WITH INFUSED ALLOGRAFTING;  Surgeon: Rozanna Box, MD;  Location: [facility];  Service: Orthopedics;  Laterality: Right;  . Hardware removal Right 01/08/2014    Procedure: HARDWARE REMOVAL RIGHT PROXIMAL FEMUR;  Surgeon: Rozanna Box, MD;  Location: [facility];  Service: Orthopedics;  Laterality: Right;      Medication List       This list is accurate as of: 04/01/14 11:59 PM.  Always use your most recent med list.               ARIPiprazole 10 MG tablet  Commonly known as:  ABILIFY  Take 10 mg by mouth daily.     aspirin EC 81 MG tablet  Take 81 mg by mouth daily.     bisacodyl 5 MG EC tablet  Commonly known as:  DULCOLAX  Take 1 tablet (5 mg total) by mouth daily as needed for moderate constipation.     CALTRATE 600+D PO  Take 1 tablet by mouth 2 (two) times daily.     cholecalciferol 1000 UNITS tablet  Commonly known as:  VITAMIN D  Take 1,000 Units by mouth daily.     COMBIVENT RESPIMAT 20-100 MCG/ACT Aers respimat  Generic drug:  Ipratropium-Albuterol  Inhale 2 puffs into the lungs every 6 (six) hours as needed  for wheezing.     DSS 100 MG Caps  Take 100 mg by mouth 2 (two) times daily.     ferrous sulfate 325 (65 FE) MG tablet  Take 325 mg by mouth 2 (two) times daily.     Fish Oil 1200 MG Caps  Take 1,200 mg by mouth 2 (two) times daily.     gabapentin 600 MG tablet  Commonly known as:  NEURONTIN  Take 600-1,200 mg by mouth 2 (two) times daily. Take 600 mg by mouth in the morning and take 1200 mg by mouth in the evening.     lisinopril 20 MG tablet  Commonly known as:  PRINIVIL,ZESTRIL  Take 20 mg by mouth daily before breakfast.     metFORMIN 500 MG tablet  Commonly known as:  GLUCOPHAGE  Take 500 mg by mouth 2 (two) times daily with a meal.     methocarbamol 500 MG tablet   Commonly known as:  ROBAXIN  Take 1-2 tablets (500-1,000 mg total) by mouth every 6 (six) hours as needed for muscle spasms.     mupirocin ointment 2 %  Commonly known as:  BACTROBAN  Place 1 application into the nose 2 (two) times daily.     nystatin-triamcinolone cream  Commonly known as:  MYCOLOG II  Apply 1 application topically daily.     OCUVITE EYE HEALTH FORMULA Caps  Take 1 capsule by mouth 2 (two) times daily.     CENTRUM SILVER ULTRA WOMENS Tabs  Take 1 tablet by mouth daily.     omeprazole 20 MG capsule  Commonly known as:  PRILOSEC  Take 20 mg by mouth at bedtime.     oxyCODONE 5 MG immediate release tablet  Commonly known as:  Oxy IR/ROXICODONE  Take one tablet by mouth every 6 hours as needed for moderate pain; Take two tablets by mouth every 6 hours as needed for severe pain     oxyCODONE-acetaminophen 5-325 MG per tablet  Commonly known as:  PERCOCET/ROXICET  Take 1-2 tablets by mouth every 6 (six) hours as needed for moderate pain or severe pain.     SYSTANE 0.4-0.3 % Soln  Generic drug:  Polyethyl Glycol-Propyl Glycol  Place 2 drops into both eyes 2 (two) times daily.        No orders of the defined types were placed in this encounter.    Immunization History  Administered Date(s) Administered  . Influenza-Unspecified 02/19/2014    History  Substance Use Topics  . Smoking status: Former Smoker    Quit date: 01/22/2008  . Smokeless tobacco: Never Used  . Alcohol Use: No    Review of Systems  DATA OBTAINED: from patient GENERAL:  no fevers, fatigue, appetite changes SKIN: No itching, rash HEENT: No complaint RESPIRATORY: No cough, wheezing, SOB CARDIAC: No chest pain, palpitations, lower extremity edema  GI: No abdominal pain, No N/V/D or constipation, No heartburn or reflux  GU: No dysuria, frequency or urgency, or incontinence  MUSCULOSKELETAL: No unrelieved bone/joint pain NEUROLOGIC: No headache, dizziness  PSYCHIATRIC: No overt  anxiety or sadness  Filed Vitals:   04/01/14 1542  BP: 116/74  Pulse: 77  Temp: 97.7 F (36.5 C)  Resp: 19    Physical Exam  GENERAL APPEARANCE: Alert, conversant, No acute distress;obese WF  SKIN: No diaphoresis rash HEENT: Unremarkable RESPIRATORY: Breathing is even, unlabored. Lung sounds are clear   CARDIOVASCULAR: Heart RRR no murmurs, rubs or gallops. No peripheral edema  GASTROINTESTINAL: Abdomen is soft, non-tender, not  distended w/ normal bowel sounds.  GENITOURINARY: Bladder non tender, not distended  NEUROLOGIC: Cranial nerves 2-12 grossly intact PSYCHIATRIC: Mood and affect appropriate to situation, no behavioral issues  Patient Active Problem List   Diagnosis Date Noted  . Type 2 diabetes mellitus without complication   . Peripheral neuropathy   . 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 COPD (chronic obstructive pulmonary disease)   . Hypertension   . GERD (gastroesophageal reflux disease)   . Depression   . Bipolar disorder   . Anxiety   . Acute blood loss anemia 03/28/2012  . Morbid obesity with BMI of 45.0-49.9, adult 03/28/2012  . Hyponatremia 03/28/2012  . Retained hardware right hip 03/28/2012    CBC    Component Value Date/Time   WBC 10.9* 01/11/2014 0508   RBC 3.31* 01/11/2014 0508   HGB 9.3* 01/11/2014 0508   HCT 28.8* 01/11/2014 0508   PLT 319 01/11/2014 0508   MCV 87.0 01/11/2014 0508   LYMPHSABS 1.7 01/08/2014 0658   MONOABS 0.6 01/08/2014 0658   EOSABS 0.2 01/08/2014 0658   BASOSABS 0.0 01/08/2014 0658    CMP     Component Value Date/Time   NA 139 01/09/2014 0502   K 4.7 01/09/2014 0502   CL 104 01/09/2014 0502   CO2 26 01/09/2014 0502   GLUCOSE 135* 01/09/2014 0502   BUN 13 01/09/2014 0502   CREATININE 0.96 01/09/2014 0502   CALCIUM 8.9 01/09/2014 0502   PROT 7.3 01/08/2014 0658   ALBUMIN 3.1* 01/08/2014 0658   AST 16 01/08/2014 0658   ALT 14 01/08/2014 0658   ALKPHOS 72 01/08/2014  0658   BILITOT <0.2* 01/08/2014 0658   GFRNONAA 61* 01/09/2014 0502   GFRAA 71* 01/09/2014 0502    Assessment and Plan  Hypertension Stable and controlled on lisinopril  COPD (chronic obstructive pulmonary disease) Chronic and stable  GERD (gastroesophageal reflux disease) Chronic and stable on prilosec  Acute blood loss anemia Most recent H/H 10.0/34.5 which is an improvement;pt on iron  Type 2 diabetes mellitus without complication Pt on metformin with recent A1c 5.9; pt on ACE  Bipolar disorder Chronic and stable; on abilify  Hyponatremia Most recent Na for 10/13 was 140.  Morbid obesity with BMI of 45.0-49.9, adult An issue but A1c have been good on very little medication  Peripheral neuropathy Continue neurontin    Hennie Duos, MD

## 2014-04-06 ENCOUNTER — Encounter: Payer: Self-pay | Admitting: Internal Medicine

## 2014-04-06 DIAGNOSIS — E119 Type 2 diabetes mellitus without complications: Secondary | ICD-10-CM | POA: Insufficient documentation

## 2014-04-06 DIAGNOSIS — G629 Polyneuropathy, unspecified: Secondary | ICD-10-CM | POA: Insufficient documentation

## 2014-04-06 NOTE — Assessment & Plan Note (Addendum)
Most recent H/H 10.0/34.5 which is an improvement;pt on iron

## 2014-04-06 NOTE — Assessment & Plan Note (Signed)
Pt on metformin with recent A1c 5.9; pt on ACE

## 2014-04-06 NOTE — Assessment & Plan Note (Signed)
Most recent Na for 10/13 was 140.

## 2014-04-06 NOTE — Assessment & Plan Note (Signed)
An issue but A1c have been good on very little medication

## 2014-04-06 NOTE — Assessment & Plan Note (Signed)
Chronic and stable on prilosec

## 2014-04-06 NOTE — Assessment & Plan Note (Signed)
Chronic and stable; on abilify

## 2014-04-06 NOTE — Assessment & Plan Note (Signed)
Stable and controlled on lisinopril

## 2014-04-06 NOTE — Assessment & Plan Note (Signed)
Chronic and stable.   

## 2014-04-06 NOTE — Assessment & Plan Note (Signed)
Continue neurontin 

## 2014-04-08 ENCOUNTER — Encounter: Payer: Self-pay | Admitting: Internal Medicine

## 2014-04-08 ENCOUNTER — Non-Acute Institutional Stay (SKILLED_NURSING_FACILITY): Payer: Medicare Other | Admitting: Internal Medicine

## 2014-04-08 DIAGNOSIS — K219 Gastro-esophageal reflux disease without esophagitis: Secondary | ICD-10-CM

## 2014-04-08 DIAGNOSIS — F3175 Bipolar disorder, in partial remission, most recent episode depressed: Secondary | ICD-10-CM

## 2014-04-08 DIAGNOSIS — F419 Anxiety disorder, unspecified: Secondary | ICD-10-CM

## 2014-04-08 DIAGNOSIS — I10 Essential (primary) hypertension: Secondary | ICD-10-CM

## 2014-04-08 DIAGNOSIS — J449 Chronic obstructive pulmonary disease, unspecified: Secondary | ICD-10-CM

## 2014-04-08 DIAGNOSIS — G629 Polyneuropathy, unspecified: Secondary | ICD-10-CM

## 2014-04-08 DIAGNOSIS — S72001K Fracture of unspecified part of neck of right femur, subsequent encounter for closed fracture with nonunion: Secondary | ICD-10-CM

## 2014-04-08 DIAGNOSIS — F329 Major depressive disorder, single episode, unspecified: Secondary | ICD-10-CM

## 2014-04-08 DIAGNOSIS — F32A Depression, unspecified: Secondary | ICD-10-CM

## 2014-04-08 DIAGNOSIS — E119 Type 2 diabetes mellitus without complications: Secondary | ICD-10-CM

## 2014-04-08 NOTE — Progress Notes (Signed)
MRN: [id number] Name: [name] [name]  Sex: female Age: 63 y.o. DOB: July 11, 1950  [place] #: [facility] Facility/Room:223 Level Of Care: SNF Provider: Inocencio Homes D Emergency Contacts: Extended Emergency Contact Information Primary Emergency Contact: [place] Phone: [phone] Relation: Sister  Code Status: FULL  Allergies: Coffee flavor; Coffea arabica; Prednisone; and Other  Chief Complaint  Patient presents with  . Discharge Note    HPI: Patient is 63 y.o. female who was admitted to SNF for OT/PT after revision of non union of hip fracture who is now ready to be discharged to home.  Past Medical History  Diagnosis Date  . COPD (chronic obstructive pulmonary disease)   . Shortness of breath   . Arthritis   . Anemia   . Hypertension   . Hyperlipemia   . Hearing loss   . Memory loss   . Pneumonia   . Bronchitis   . GERD (gastroesophageal reflux disease)   . Encounter for blood transfusion   . Skin yeast infection     history of under bilateral breast,, pannus, inner legs  . Depression   . Bipolar disorder   . Anxiety   . Nonunion, 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  . Diabetes mellitus   . Peripheral neuropathy     Past Surgical History  Procedure Laterality Date  . Tonsillectomy    . Femur fx    . Hip fracture surgery    . Esophagogastroduodenoscopy  10/22/2011    Procedure: ESOPHAGOGASTRODUODENOSCOPY (EGD);  Surgeon: Beryle Beams, MD;  Location: [facility];  Service: Endoscopy;  Laterality: N/A;  . Colonoscopy  10/22/2011    Procedure: COLONOSCOPY;  Surgeon: Beryle Beams, MD;  [facility];  Service: Endoscopy;  Laterality: N/A;  . Hardware removal  03/27/2012    Procedure: HARDWARE REMOVAL;  Surgeon: Mauri Pole, MD;  [facility];  Service: Orthopedics;  Laterality: Right;  . Intramedullary (im) nail intertrochanteric  03/27/2012    Procedure: INTRAMEDULLARY (IM) NAIL  INTERTROCHANTRIC;  Surgeon: Mauri Pole, MD;  [facility]: [facility];  Service: Orthopedics;  Laterality: Right;  Possible Exchange with a Shorter Trochantric Nail  . Femur im nail Right 01/08/2014    Procedure: INTRAMEDULLARY (IM) NAIL RIGHT HIP WITH INFUSED ALLOGRAFTING;  Surgeon: Rozanna Box, MD;  Location: [facility];  Service: Orthopedics;  Laterality: Right;  . Hardware removal Right 01/08/2014    Procedure: HARDWARE REMOVAL RIGHT PROXIMAL FEMUR;  Surgeon: Rozanna Box, MD;  Location: [facility];  Service: Orthopedics;  Laterality: Right;      Medication List       This list is accurate as of: 04/08/14  1:51 PM.  Always use your most recent med list.               ARIPiprazole 10 MG tablet  Commonly known as:  ABILIFY  Take 10 mg by mouth daily.     aspirin EC 81 MG tablet  Take 81 mg by mouth daily.     bisacodyl 5 MG EC tablet  Commonly known as:  DULCOLAX  Take 1 tablet (5 mg total) by mouth daily as needed for moderate constipation.     CALTRATE 600+D PO  Take 1 tablet by mouth 2 (two) times daily.     cholecalciferol 1000 UNITS tablet  Commonly known as:  VITAMIN D  Take 1,000 Units by mouth daily.     COMBIVENT RESPIMAT 20-100 MCG/ACT Aers respimat  Generic drug:  Ipratropium-Albuterol  Inhale 2 puffs into the lungs every 6 (six) hours as needed for wheezing.     DSS 100 MG Caps  Take 100 mg by mouth 2 (two) times daily.     ferrous sulfate 325 (65 FE) MG tablet  Take 325 mg by mouth 2 (two) times daily.     Fish Oil 1200 MG Caps  Take 1,200 mg by mouth 2 (two) times daily.     gabapentin 600 MG tablet  Commonly known as:  NEURONTIN  Take 600-1,200 mg by mouth 2 (two) times daily. Take 600 mg by mouth in the morning and take 1200 mg by mouth in the evening.     lisinopril 20 MG tablet  Commonly known as:  PRINIVIL,ZESTRIL  Take 20 mg by mouth daily before breakfast.     metFORMIN 500 MG tablet  Commonly known as:  GLUCOPHAGE  Take 500 mg by mouth 2 (two)  times daily with a meal.     mupirocin ointment 2 %  Commonly known as:  BACTROBAN  Place 1 application into the nose 2 (two) times daily.     OCUVITE EYE HEALTH FORMULA Caps  Take 1 capsule by mouth 2 (two) times daily.     CENTRUM SILVER ULTRA WOMENS Tabs  Take 1 tablet by mouth daily.     omeprazole 20 MG capsule  Commonly known as:  PRILOSEC  Take 20 mg by mouth at bedtime.     oxyCODONE-acetaminophen 5-325 MG per tablet  Commonly known as:  PERCOCET/ROXICET  Take 1-2 tablets by mouth every 6 (six) hours as needed for moderate pain or severe pain.     SYSTANE 0.4-0.3 % Soln  Generic drug:  Polyethyl Glycol-Propyl Glycol  Place 2 drops into both eyes 2 (two) times daily.        No orders of the defined types were placed in this encounter.    Immunization History  Administered Date(s) Administered  . Influenza-Unspecified 02/19/2014    History  Substance Use Topics  . Smoking status: Former Smoker    Quit date: 01/22/2008  . Smokeless tobacco: Never Used  . Alcohol Use: No    Filed Vitals:   04/08/14 1332  BP: 102/52  Pulse: 79  Resp: 20    Physical Exam  GENERAL APPEARANCE: Alert, conversant. No acute distress.  HEENT: Unremarkable. RESPIRATORY: Breathing is even, unlabored. Lung sounds are clear   CARDIOVASCULAR: Heart RRR no murmurs, rubs or gallops. No peripheral edema.  GASTROINTESTINAL: Abdomen is soft, non-tender, not distended w/ normal bowel sounds.  NEUROLOGIC: Cranial nerves 2-12 grossly intact  Patient Active Problem List   Diagnosis Date Noted  . Type 2 diabetes mellitus without complication   . Peripheral neuropathy   . 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 COPD (chronic obstructive pulmonary disease)   . Hypertension   . GERD (gastroesophageal reflux disease)   . Depression   . Bipolar disorder   . Anxiety   . Acute blood loss anemia 03/28/2012  . Morbid obesity with BMI of 45.0-49.9, adult  03/28/2012  . Hyponatremia 03/28/2012  . Retained hardware right hip 03/28/2012    CBC    Component Value Date/Time   WBC 10.9* 01/11/2014 0508   RBC 3.31* 01/11/2014 0508   HGB 9.3* 01/11/2014 0508   HCT 28.8* 01/11/2014 0508   PLT 319 01/11/2014 0508   MCV 87.0 01/11/2014 0508   LYMPHSABS 1.7 01/08/2014 0658   MONOABS 0.6 01/08/2014 3785  EOSABS 0.2 01/08/2014 0658   BASOSABS 0.0 01/08/2014 0658    CMP     Component Value Date/Time   NA 139 01/09/2014 0502   K 4.7 01/09/2014 0502   CL 104 01/09/2014 0502   CO2 26 01/09/2014 0502   GLUCOSE 135* 01/09/2014 0502   BUN 13 01/09/2014 0502   CREATININE 0.96 01/09/2014 0502   CALCIUM 8.9 01/09/2014 0502   PROT 7.3 01/08/2014 0658   ALBUMIN 3.1* 01/08/2014 0658   AST 16 01/08/2014 0658   ALT 14 01/08/2014 0658   ALKPHOS 72 01/08/2014 0658   BILITOT <0.2* 01/08/2014 0658   GFRNONAA 61* 01/09/2014 0502   GFRAA 71* 01/09/2014 0502    Assessment and Plan  Pt is stable for discharge to home with HH/OT/PT and necessary DME.  Hennie Duos, MD

## 2014-04-11 DIAGNOSIS — S99911D Unspecified injury of right ankle, subsequent encounter: Secondary | ICD-10-CM | POA: Diagnosis not present

## 2014-04-11 DIAGNOSIS — M6281 Muscle weakness (generalized): Secondary | ICD-10-CM | POA: Diagnosis not present

## 2014-04-11 DIAGNOSIS — S7221XK Displaced subtrochanteric fracture of right femur, subsequent encounter for closed fracture with nonunion: Secondary | ICD-10-CM | POA: Diagnosis not present

## 2014-04-11 DIAGNOSIS — R262 Difficulty in walking, not elsewhere classified: Secondary | ICD-10-CM | POA: Diagnosis not present

## 2014-12-11 ENCOUNTER — Other Ambulatory Visit: Payer: Self-pay

## 2014-12-11 DIAGNOSIS — Z1231 Encounter for screening mammogram for malignant neoplasm of breast: Secondary | ICD-10-CM

## 2014-12-16 ENCOUNTER — Ambulatory Visit
Admission: RE | Admit: 2014-12-16 | Discharge: 2014-12-16 | Disposition: A | Discharge status: Home / Self Care | Payer: Medicare Other | Source: Ambulatory Visit

## 2014-12-16 DIAGNOSIS — Z1231 Encounter for screening mammogram for malignant neoplasm of breast: Secondary | ICD-10-CM

## 2015-02-12 ENCOUNTER — Other Ambulatory Visit: Payer: Self-pay | Admitting: General Surgery

## 2015-03-04 ENCOUNTER — Encounter: Payer: Self-pay | Admitting: Dietician

## 2015-03-04 ENCOUNTER — Encounter: Payer: Medicare Other | Attending: General Surgery | Admitting: Dietician

## 2015-03-04 VITALS — Ht 61.0 in | Wt 282.3 lb

## 2015-03-04 DIAGNOSIS — Z713 Dietary counseling and surveillance: Secondary | ICD-10-CM | POA: Diagnosis not present

## 2015-03-04 DIAGNOSIS — Z6841 Body Mass Index (BMI) 40.0 and over, adult: Secondary | ICD-10-CM | POA: Diagnosis not present

## 2015-03-04 NOTE — Progress Notes (Signed)
  Pre-Op Assessment Visit:  Pre-Operative Sleeve Gastrectomy Surgery  Medical Nutrition Therapy:  Appt start time: 5631  End time:  1150.  Patient was seen on 03/04/2015 for Pre-Operative Nutrition Assessment. Assessment and letter of approval faxed to [facility] Bariatric Surgery Program coordinator on 03/04/2015.   Preferred Learning Style:   No preference indicated   Learning Readiness:   Contemplating  Handouts given during visit include:  Pre-Op Goals Bariatric Surgery Protein Shakes Therapists in the area   During the appointment today the following Pre-Op Goals were reviewed with the patient: Maintain or lose weight as instructed by your surgeon Make healthy food choices Begin to limit portion sizes Limited concentrated sugars and fried foods Keep fat/sugar in the single digits per serving on   food labels Practice CHEWING your food  (aim for 30 chews per bite or until applesauce consistency) Practice not drinking 15 minutes before, during, and 30 minutes after each meal/snack Avoid all carbonated beverages  Avoid/limit caffeinated beverages  Avoid all sugar-sweetened beverages Consume 3 meals per day; eat every 3-5 hours Make a list of non-food related activities Aim for 64-100 ounces of FLUID daily  Aim for at least 60-80 grams of PROTEIN daily Look for a liquid protein source that contain ?15 g protein and ?5 g carbohydrate  (ex: shakes, drinks, shots)  Patient-Centered Goals: Goals: lose enough weight to have knee surgery and buy regular sized clothes  8-9 confidence/9 importance scale 1-10  Demonstrated degree of understanding via:  Teach Back  Teaching Method Utilized:  Visual Auditory Hands on  Barriers to learning/adherence to lifestyle change: lives alone, mobility issues, doesn't cook  Patient to call the [facility] to enroll in Pre-Op and Post-Op Nutrition Education when surgery date is scheduled.

## 2015-03-04 NOTE — Patient Instructions (Signed)

## 2015-04-07 ENCOUNTER — Encounter: Payer: Medicare Other | Attending: General Surgery

## 2015-04-07 VITALS — Ht 61.0 in | Wt 285.1 lb

## 2015-04-07 DIAGNOSIS — Z6841 Body Mass Index (BMI) 40.0 and over, adult: Secondary | ICD-10-CM | POA: Diagnosis not present

## 2015-04-07 DIAGNOSIS — Z713 Dietary counseling and surveillance: Secondary | ICD-10-CM | POA: Diagnosis not present

## 2015-04-07 NOTE — Progress Notes (Signed)
Supervised Weight Loss Class:  Appt start time: 1600   End time:  1630.  Patient was seen on 04/07/2015 for the "Mindful Eating" Supervised Weight Loss Class at the [facility].   Surgery type: sleeve gastrectomy Start weight at [facility]: 282.3 lbs Weight today: 285.1 lbs Weight change: 2.8 lbs gain  The following learning objectives were met by the patient during this class:  Objectives: -Discuss masticating foods to appropriate consistency  -Helps food fit in pouch and prevents pain and vomiting  -Eating slowly and taking tiny bites help brain register fullness -Encourage patients to begin to think about what diet will be like after surgery -Encourage patients to begin to consider portion sizes and listen to hunger/fullness cues -Review importance of regular meals and snacks -Discuss diet advancement after surgery -Review causes and symptoms of dumping syndrome  Goals: -Begin limiting fried and other high fat foods and foods high in sugar -Eat proteins first, then vegetables, then starch -Practice chewing foods to applesauce consistency -Aim to spend 20 minutes eating meals -Work on eating 3 meals a day (or every 3-5 hours) if you are not already doing so  Handouts given: Mindful Eating brochure

## 2015-05-05 ENCOUNTER — Encounter: Payer: Medicare Other | Attending: General Surgery | Admitting: Dietician

## 2015-05-05 VITALS — Ht 61.0 in | Wt 290.7 lb

## 2015-05-05 DIAGNOSIS — Z6841 Body Mass Index (BMI) 40.0 and over, adult: Secondary | ICD-10-CM | POA: Diagnosis not present

## 2015-05-05 DIAGNOSIS — Z713 Dietary counseling and surveillance: Secondary | ICD-10-CM | POA: Diagnosis not present

## 2015-05-06 ENCOUNTER — Encounter: Payer: Self-pay | Admitting: Dietician

## 2015-05-06 NOTE — Progress Notes (Signed)
Supervised Weight Loss Class:  Appt start time: 1600   End time:  1630.  Patient was seen on 05/05/2015 for the "Protein" Supervised Weight Loss Class at the [facility].   Surgery type: sleeve gastrectomy Start weight at [facility]: 282 lbs on 03/04/15 Weight today: 290.7 lbs Weight change: 8.7 lbs gain   The following learning objectives were met by the patient during this class:  Objectives: -Discuss importance of protein after surgery  -Promotes healing  -Helps preserve lean mass (muscles and organs) as you lose fat -Identify foods that contain protein -Discuss recommended daily protein goals for men and women after surgery -Educate patients on body composition scale and purpose   Goals: -Begin to be mindful of how much protein is in the foods you are eating -Know how much protein is recommended daily after surgery -Begin having a protein food with each meal and snack  Handouts given: "Protein" handout

## 2015-06-09 ENCOUNTER — Encounter: Payer: Medicare Other | Attending: General Surgery | Admitting: Dietician

## 2015-06-09 DIAGNOSIS — Z6841 Body Mass Index (BMI) 40.0 and over, adult: Secondary | ICD-10-CM | POA: Diagnosis not present

## 2015-06-09 DIAGNOSIS — Z713 Dietary counseling and surveillance: Secondary | ICD-10-CM | POA: Insufficient documentation

## 2015-06-10 ENCOUNTER — Encounter: Payer: Self-pay | Admitting: Dietician

## 2015-06-10 NOTE — Progress Notes (Signed)
Supervised Weight Loss Class:  Appt start time: 1600   End time:  1630.  Patient was seen on 06/09/15 for the "Protein Shakes" Supervised Weight Loss Class at the [facility].   Surgery type: sleeve gastrectomy Start weight at [facility]: 282 lbs on 03/04/15 Weight today: 284.1 lbs Weight change: 6.6 lbs loss  The following learning objectives were met by the patient during this class:  Objectives: -Review when protein shakes will be recommended in the surgery process -Identify criteria for acceptable protein shakes -Provide examples of acceptable and unacceptable protein shakes -Remind patients that taste preferences may change after surgery  Goals: -Find 1 or more protein shakes that meet the criteria  -Look for a shake that is acceptable for taste and budget  Handouts given: "Protein shakes" pamphlet

## 2015-07-07 ENCOUNTER — Encounter: Payer: Medicare Other | Attending: General Surgery | Admitting: Dietician

## 2015-07-07 ENCOUNTER — Encounter: Payer: Self-pay | Admitting: Dietician

## 2015-07-07 DIAGNOSIS — Z6841 Body Mass Index (BMI) 40.0 and over, adult: Secondary | ICD-10-CM | POA: Diagnosis not present

## 2015-07-07 DIAGNOSIS — Z713 Dietary counseling and surveillance: Secondary | ICD-10-CM | POA: Diagnosis not present

## 2015-07-07 NOTE — Progress Notes (Signed)
Supervised Weight Loss Class:  Appt start time: 1600   End time:  1630.  Patient was seen on 07/07/2015 for the "Fluid" Supervised Weight Loss Class at the [facility].   Surgery type: sleeve gastrectomy Start weight at [facility]: 282 lbs on 03/04/15 Weight today: 288.6 lbs Weight change: 4 lbs gain  Objectives: -Discuss importance of fluid and hydration status after surgery -Identify appropriate fluids after surgery -Discuss importance of limiting caffeine in the perioperative period -Review importance of avoiding drinking while eating  Goals: -Begin to wean off of caffeine -Practice avoiding fluids 15 minutes before, during, and 30 minutes after meals -Begin to work your way up to 64 ounces of sugar free, caffeine free, non-carbonated fluids  Handouts given: "Fluid" handout

## 2015-07-18 ENCOUNTER — Other Ambulatory Visit (HOSPITAL_COMMUNITY): Payer: Self-pay | Admitting: General Surgery

## 2015-07-18 DIAGNOSIS — I159 Secondary hypertension, unspecified: Secondary | ICD-10-CM

## 2015-07-25 ENCOUNTER — Encounter: Payer: Self-pay | Admitting: Cardiology

## 2015-07-25 ENCOUNTER — Ambulatory Visit (INDEPENDENT_AMBULATORY_CARE_PROVIDER_SITE_OTHER): Payer: Medicare Other | Admitting: Cardiology

## 2015-07-25 VITALS — BP 114/66 | HR 83 | Ht 61.0 in | Wt 287.8 lb

## 2015-07-25 DIAGNOSIS — Z01818 Encounter for other preprocedural examination: Secondary | ICD-10-CM

## 2015-07-25 DIAGNOSIS — R0602 Shortness of breath: Secondary | ICD-10-CM | POA: Diagnosis not present

## 2015-07-25 DIAGNOSIS — I10 Essential (primary) hypertension: Secondary | ICD-10-CM | POA: Diagnosis not present

## 2015-07-25 NOTE — Patient Instructions (Addendum)
Medication Instructions:  Your physician recommends that you continue on your current medications as directed. Please refer to the Current Medication list given to you today.   Labwork: None ordered  Testing/Procedures: Your physician has requested that you have an echocardiogram. Echocardiography is a painless test that uses sound waves to create images of your heart. It provides your doctor with information about the size and shape of your heart and how well your heart's chambers and valves are working. This procedure takes approximately one hour. There are no restrictions for this procedure.   Follow-Up: Your physician recommends that you schedule a follow-up appointment in: WILL BE BASED UPON RESULTS FROM THE ECHOCARDIOGRAM   Any Other Special Instructions Will Be Listed Below (If Applicable).   If you need a refill on your cardiac medications before your next appointment, please call your pharmacy.

## 2015-07-25 NOTE — Progress Notes (Signed)
Cardiology Office Note   NEW PATIENT  Date:  07/25/2015   ID:  [name] [name], DOB 1951-01-07, MRN [id number]  PCP:  [name] [name]  Cardiologist:  New Dr. Acie Fredrickson    Chief Complaint  Patient presents with  . Pre-op Exam      History of Present Illness: [name] [name] is a 65 y.o. female who presents for cardiac risk eval for bariatric surgery  Dr. Redmond Pulling has seen and would like to proceed with laparoscopic Roux-en-Y gastric bypass.   She has hx of HTN, GERD, DM-s, depression COPD back pain and knee pain.  She has no CAD but + FH with her father and brother and sister with hx of ablations.  Her mother with HF and had PPM.  Today she denies any chest pain. She does for DOE.  She exercises on a stationary bike and can only ride 2-3 min and has to stop for SOB.  It subsides and she rides again.  she does arm exercises as well.  No chest pain with any of this. No palpitations.    Past Medical History  Diagnosis Date  . COPD (chronic obstructive pulmonary disease) ([place])   . Shortness of breath   . Arthritis   . Anemia   . Hypertension   . Hyperlipemia   . Hearing loss   . Memory loss   . Pneumonia   . Bronchitis   . GERD (gastroesophageal reflux disease)   . Encounter for blood transfusion   . Skin yeast infection     history of under bilateral breast,, pannus, inner legs  . Depression   . Bipolar disorder ([place])   . Anxiety   . Nonunion, 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  . Diabetes mellitus   . Peripheral neuropathy [facility])     Past Surgical History  Procedure Laterality Date  . Tonsillectomy    . Femur fx    . Hip fracture surgery    . Esophagogastroduodenoscopy  10/22/2011    Procedure: ESOPHAGOGASTRODUODENOSCOPY (EGD);  Surgeon: Beryle Beams, MD;  Location: [facility];  Service: Endoscopy;  Laterality: N/A;  . Colonoscopy  10/22/2011    Procedure: COLONOSCOPY;  Surgeon: Beryle Beams, MD;  [facility];  Service: Endoscopy;   Laterality: N/A;  . Hardware removal  03/27/2012    Procedure: HARDWARE REMOVAL;  Surgeon: Mauri Pole, MD;  [facility];  Service: Orthopedics;  Laterality: Right;  . Intramedullary (im) nail intertrochanteric  03/27/2012    Procedure: INTRAMEDULLARY (IM) NAIL INTERTROCHANTRIC;  Surgeon: Mauri Pole, MD;  [facility];  Service: Orthopedics;  Laterality: Right;  Possible Exchange with a Shorter Trochantric Nail  . Femur im nail Right 01/08/2014    Procedure: INTRAMEDULLARY (IM) NAIL RIGHT HIP WITH INFUSED ALLOGRAFTING;  Surgeon: Rozanna Box, MD;  Location: [place];  Service: Orthopedics;  Laterality: Right;  . Hardware removal Right 01/08/2014    Procedure: HARDWARE REMOVAL RIGHT PROXIMAL FEMUR;  Surgeon: Rozanna Box, MD;  Location: [place];  Service: Orthopedics;  Laterality: Right;     Current Outpatient Prescriptions  Medication Sig Dispense Refill  . acetaminophen (TYLENOL) 500 MG tablet Take 650 mg by mouth every 6 (six) hours as needed for mild pain.     Marland Kitchen aspirin EC 81 MG tablet Take 81 mg by mouth daily.    . Calcium Carbonate-Vitamin D (CALTRATE 600+D PO) Take 1 tablet by mouth 2 (two) times daily.     . cholecalciferol (VITAMIN  D) 1000 UNITS tablet Take 1,000 Units by mouth daily.    Marland Kitchen docusate sodium 100 MG CAPS Take 100 mg by mouth 2 (two) times daily. 10 capsule   . ferrous sulfate 325 (65 FE) MG tablet Take 325 mg by mouth 2 (two) times daily.    Marland Kitchen gabapentin (NEURONTIN) 600 MG tablet Take 600-1,200 mg by mouth 2 (two) times daily. Take 600 mg by mouth in the morning and take 1200 mg by mouth in the evening.    Marland Kitchen HYDROcodone-acetaminophen (NORCO/VICODIN) 5-325 MG tablet Take 1 tablet by mouth 2 (two) times daily as needed. (pain)  0  . Ipratropium-Albuterol (COMBIVENT RESPIMAT) 20-100 MCG/ACT AERS respimat Inhale 2 puffs into the lungs every 6 (six) hours as needed for wheezing.    Marland Kitchen lisinopril (PRINIVIL,ZESTRIL) 20 MG tablet Take 20 mg by mouth daily before  breakfast.     . metFORMIN (GLUCOPHAGE) 500 MG tablet Take 500 mg by mouth 2 (two) times daily with a meal.    . Multiple Vitamins-Minerals (CENTRUM SILVER ULTRA WOMENS) TABS Take 1 tablet by mouth daily.    . Multiple Vitamins-Minerals (OCUVITE EYE HEALTH FORMULA) CAPS Take 1 capsule by mouth 2 (two) times daily.     . Omega-3 Fatty Acids (FISH OIL) 1200 MG CAPS Take 1,200 mg by mouth 2 (two) times daily.    Marland Kitchen omeprazole (PRILOSEC) 20 MG capsule Take 20 mg by mouth at bedtime.     Marland Kitchen oxyCODONE-acetaminophen (PERCOCET/ROXICET) 5-325 MG per tablet Take 1-2 tablets by mouth every 6 (six) hours as needed for moderate pain or severe pain. 90 tablet 0  . Polyethyl Glycol-Propyl Glycol (SYSTANE) 0.4-0.3 % SOLN Place 2 drops into both eyes 2 (two) times daily.      No current facility-administered medications for this visit.    Allergies:   Coffee flavor; Coffea arabica; Prednisone; and Other    Social History:  The patient  reports that she quit smoking about 7 years ago. She has never used smokeless tobacco. She reports that she does not drink alcohol or use illicit drugs.   Family History:  The patient's family history includes Arrhythmia in her mother; COPD in her sister; Diabetes Mellitus II in her mother; Heart attack in her father; Heart disease in her brother, father, and sister; Heart failure in her mother; Leukemia in her father and sister; Other in her brother and sister.    ROS:  General:no colds or fevers, no weight changes Skin:no rashes or ulcers HEENT:no blurred vision, no congestion CV:see HPI PUL:see HPI GI:no diarrhea constipation or melena, no indigestion GU:no hematuria, no dysuria MS:+ joint pain rt knee, no claudication Neuro:no syncope, occ lightheadedness Endo:+ diabetes has been stable, no thyroid disease  Wt Readings from Last 3 Encounters:  07/25/15 287 lb 12.8 oz (130.545 kg)  07/07/15 288 lb 9.6 oz (130.908 kg)  06/10/15 284 lb 1.6 oz (128.867 kg)      PHYSICAL EXAM: VS:  BP 114/66 mmHg  Pulse 83  Ht 5\' 1"  (1.549 m)  Wt 287 lb 12.8 oz (130.545 kg)  BMI 54.41 kg/m2 , BMI Body mass index is 54.41 kg/(m^2). General:Pleasant affect, NAD Skin:Warm and dry, brisk capillary refill HEENT:normocephalic, sclera clear, mucus membranes moist Neck:supple, no JVD, no bruits  Heart:S1S2 RRR without murmur, gallup, rub or click Lungs:clear without rales, rhonchi, or wheezes [id number], soft, non tender, + BS, do not palpate liver spleen or masses Ext:1+ lower ext edema, 2+ pedal pulses, 2+ radial pulses Neuro:alert and oriented X 3,  MAE, follows commands, + facial symmetry    EKG:  EKG is ordered today. The ekg ordered today demonstrates SR LAD, Incomplete RBBB no changes from previous EKGs.   Recent Labs: No results found for requested labs within last 365 days.    Lipid Panel No results found for: CHOL, TRIG, HDL, CHOLHDL, VLDL, LDLCALC, LDLDIRECT     Other studies Reviewed: Additional studies/ records that were reviewed today include: Dr. Dois Davenport notes. .   ASSESSMENT AND PLAN:  1.  Pre-op eval for gastric bypass.  Pt with no chest pain but chronic DOE with COPD. No hx of CAD.  No palpitations.  Dr. Acie Fredrickson has seen the pt and plan will be for echo. We would expect her to have diastolic dysfunction but will rule out systolic dysfunction.   If stable would be low risk for surgery from cardiac perspective. Marland Kitchen   2.Morbid obesity  With BMI of 50-59 with plan for lap. Roux-en-Y gastric bypass.   3. Essential HTN stable  4. DM-2 stable per PCP  5.  COPD  Per PCP with SOB  Current medicines are reviewed with the patient today.  The patient Has no concerns regarding medicines.  The following changes have been made:  See above Labs/ tests ordered today include:see above  Disposition:   FU:  see above  Lennie Muckle, NP  07/25/2015 2:39 PM    [facility] [place], [place], [address] [place], [place] Phone: [phone]; Fax: [phone]  Attending Note:   The patient was seen and examined.  Agree with assessment and plan as noted above.  Changes made to the above note as needed.  Pt appears to be at low risk.   Will get an echo for further assessment  Will see her back as needed   Ramond Dial., MD, [facility] 07/28/2015, 3:54 PM [address]  [place] Pager [phone]

## 2015-07-31 ENCOUNTER — Ambulatory Visit (HOSPITAL_BASED_OUTPATIENT_CLINIC_OR_DEPARTMENT_OTHER): Payer: Medicare Other | Attending: General Surgery | Admitting: *Deleted

## 2015-07-31 VITALS — Ht 61.0 in | Wt 287.0 lb

## 2015-07-31 DIAGNOSIS — G4733 Obstructive sleep apnea (adult) (pediatric): Secondary | ICD-10-CM | POA: Insufficient documentation

## 2015-07-31 DIAGNOSIS — K219 Gastro-esophageal reflux disease without esophagitis: Secondary | ICD-10-CM

## 2015-07-31 DIAGNOSIS — G4730 Sleep apnea, unspecified: Secondary | ICD-10-CM | POA: Diagnosis present

## 2015-07-31 DIAGNOSIS — R0683 Snoring: Secondary | ICD-10-CM | POA: Diagnosis not present

## 2015-08-04 ENCOUNTER — Encounter: Payer: Medicare Other | Attending: General Surgery | Admitting: Dietician

## 2015-08-04 DIAGNOSIS — Z6841 Body Mass Index (BMI) 40.0 and over, adult: Secondary | ICD-10-CM | POA: Diagnosis not present

## 2015-08-04 DIAGNOSIS — Z713 Dietary counseling and surveillance: Secondary | ICD-10-CM | POA: Diagnosis not present

## 2015-08-05 ENCOUNTER — Encounter: Payer: Self-pay | Admitting: Dietician

## 2015-08-05 NOTE — Progress Notes (Signed)
Supervised Weight Loss Class:  Appt start time: 1600   End time:  1630.  Patient was seen on 08/04/2015 for the "Exercise" Supervised Weight Loss Class at [facility].   Surgery type: sleeve gastrectomy Start weight at [facility]: 282 lbs on 03/04/15 Weight today: 286.6 lbs Weight change: 2 lbs loss  The following learning objectives were met by the patient during this class:  Objectives: -Discuss importance of exercise  -Helps burn calories  -Preserves/builds lean mass  -Stress relief  -Weight maintenance -Explain BELT program details  Goals: -Find an exercise/activity that you enjoy -Begin to develop an exercise routine  Handouts given: "Exercise" pamphlet

## 2015-08-07 ENCOUNTER — Ambulatory Visit (HOSPITAL_COMMUNITY)
Admission: RE | Admit: 2015-08-07 | Discharge: 2015-08-07 | Disposition: A | Discharge status: Home / Self Care | Payer: Medicare Other | Source: Ambulatory Visit | Attending: General Surgery | Admitting: General Surgery

## 2015-08-07 DIAGNOSIS — I159 Secondary hypertension, unspecified: Secondary | ICD-10-CM | POA: Insufficient documentation

## 2015-08-07 DIAGNOSIS — K449 Diaphragmatic hernia without obstruction or gangrene: Secondary | ICD-10-CM | POA: Diagnosis not present

## 2015-08-07 DIAGNOSIS — R918 Other nonspecific abnormal finding of lung field: Secondary | ICD-10-CM | POA: Diagnosis not present

## 2015-08-07 DIAGNOSIS — K5641 Fecal impaction: Secondary | ICD-10-CM | POA: Insufficient documentation

## 2015-08-07 DIAGNOSIS — Q791 Other congenital malformations of diaphragm: Secondary | ICD-10-CM | POA: Insufficient documentation

## 2015-08-09 DIAGNOSIS — G4733 Obstructive sleep apnea (adult) (pediatric): Secondary | ICD-10-CM | POA: Diagnosis not present

## 2015-08-09 NOTE — Progress Notes (Signed)
Patient Name: [name] [name], [name] [name] Date: 07/31/2015 Gender: Female D.O.B: [birth year] Age (years): 64 Referring Provider: Greer Pickerel Height (inches): 61 Interpreting Physician: Baird Lyons MD, ABSM Weight (lbs): 287 RPSGT: Gerhard Perches BMI: 54 MRN: [id number] Neck Size: 16.00 CLINICAL INFORMATION The patient is referred for a split night study with BPAP.  MEDICATIONS Medications taken by the patient : charted for review Medications administered by patient during sleep study : No sleep medicine administered.  SLEEP STUDY TECHNIQUE As per the AASM Manual for the Scoring of Sleep and Associated Events v2.3 (April 2016) with a hypopnea requiring 4% desaturations. The channels recorded and monitored were frontal, central and occipital EEG, electrooculogram (EOG), submentalis EMG (chin), nasal and oral airflow, thoracic and abdominal wall motion, anterior tibialis EMG, snore microphone, electrocardiogram, and pulse oximetry. Bi-level positive airway pressure (BiPAP) was initiated when the patient met split night criteria and was titrated according to treat sleep-disordered breathing.  RESPIRATORY PARAMETERS Diagnostic Total AHI (/hr): 23.3 RDI (/hr): 25.3 OA Index (/hr): 0.4 CA Index (/hr): 0.0 REM AHI (/hr): 87.6 NREM AHI (/hr): 14.2 Supine AHI (/hr): 23.3 Non-supine AHI (/hr): N/A Min O2 Sat (%): 71.00 Mean O2 (%): 89.70 Time below 88% (min): 20.0   Titration Optimal IPAP Pressure (cm): 16 Optimal EPAP Pressure (cm): 12 AHI at Optimal Pressure (/hr): 0.0 Min O2 at Optimal Pressure (%): 86.0 Sleep % at Optimal (%): 95 Supine % at Optimal (%): 100      SLEEP ARCHITECTURE The study was initiated at 10:56:21 PM and terminated at 5:08:56 AM. The total recorded time was 372.6 minutes. EEG confirmed total sleep time was 304.5 minutes yielding a sleep efficiency of 81.7%. Sleep onset after lights out was 5.1 minutes with a REM latency of 73.0 minutes. The patient spent 5.75% of the  night in stage N1 sleep, 67.82% in stage N2 sleep, 17.24% in stage N3 and 9.20% in REM. Wake after sleep onset (WASO) was 63.0 minutes. The Arousal Index was 6.9/hour.  LEG MOVEMENT DATA The total Periodic Limb Movements of Sleep (PLMS) were 31. The PLMS index was 6.11 .  CARDIAC DATA The 2 lead EKG demonstrated sinus rhythm. The mean heart rate was 82.88 beats per minute. Other EKG findings include: None.  IMPRESSIONS - Moderate obstructive sleep apnea occurred during the diagnostic portion of the study (AHI = 23.3 /hour). An optimal PAP pressure was selected for this patient ( 16 / cm of water). BiPAP was used to address Central Apneas noted during CPAP titration. - No significant central sleep apnea occurred during the diagnostic portion of the study (CAI = 0.0/hour). - Severe oxygen desaturation was noted during the diagnostic portion of the study (Min O2 = 71.00%). - The patient snored with Soft snoring volume during the diagnostic portion of the study. - No cardiac abnormalities were noted during this study. - Clinically significant periodic limb movements of sleep did not occur during the study.  DIAGNOSIS - Obstructive Sleep Apnea (327.23 [G47.33 ICD-10])  RECOMMENDATIONS - Trial of BiPAP therapy on 16/12 cm H2O with a Small size Resmed Full Face Mask AirFit F10 mask and heated humidification. - Avoid alcohol, sedatives and other CNS depressants that may worsen sleep apnea and disrupt normal sleep architecture. - Sleep hygiene should be reviewed to assess factors that may improve sleep quality. - Weight management and regular exercise should be initiated or continued.  Deneise Lever Diplomate, American Board of Sleep Medicine  ELECTRONICALLY SIGNED ON:  08/09/2015, 10:41 AM [employer] PH: [phone]   FX: [phone] ACCREDITED BY THE AMERICAN ACADEMY OF SLEEP MEDICINE

## 2015-08-11 ENCOUNTER — Other Ambulatory Visit: Payer: Self-pay

## 2015-08-11 ENCOUNTER — Ambulatory Visit (HOSPITAL_COMMUNITY): Payer: Medicare Other | Attending: Cardiology

## 2015-08-11 ENCOUNTER — Other Ambulatory Visit (HOSPITAL_COMMUNITY): Payer: Self-pay | Admitting: *Deleted

## 2015-08-11 DIAGNOSIS — E785 Hyperlipidemia, unspecified: Secondary | ICD-10-CM | POA: Diagnosis not present

## 2015-08-11 DIAGNOSIS — Z01818 Encounter for other preprocedural examination: Secondary | ICD-10-CM

## 2015-08-11 DIAGNOSIS — I059 Rheumatic mitral valve disease, unspecified: Secondary | ICD-10-CM | POA: Diagnosis not present

## 2015-08-11 DIAGNOSIS — Z6841 Body Mass Index (BMI) 40.0 and over, adult: Secondary | ICD-10-CM | POA: Insufficient documentation

## 2015-08-11 DIAGNOSIS — I119 Hypertensive heart disease without heart failure: Secondary | ICD-10-CM | POA: Insufficient documentation

## 2015-08-11 DIAGNOSIS — R0602 Shortness of breath: Secondary | ICD-10-CM

## 2015-08-11 DIAGNOSIS — E119 Type 2 diabetes mellitus without complications: Secondary | ICD-10-CM | POA: Insufficient documentation

## 2015-08-11 DIAGNOSIS — J449 Chronic obstructive pulmonary disease, unspecified: Secondary | ICD-10-CM | POA: Insufficient documentation

## 2015-08-11 LAB — ECHOCARDIOGRAM COMPLETE

## 2015-08-11 MED ORDER — PERFLUTREN LIPID MICROSPHERE
1.0000 mL | INTRAVENOUS | Status: AC | PRN
  Administered 2015-08-11: 3 mL via INTRAVENOUS

## 2015-08-13 ENCOUNTER — Ambulatory Visit (INDEPENDENT_AMBULATORY_CARE_PROVIDER_SITE_OTHER): Payer: 59 | Admitting: Psychiatry

## 2015-08-19 ENCOUNTER — Ambulatory Visit (INDEPENDENT_AMBULATORY_CARE_PROVIDER_SITE_OTHER): Payer: 59 | Admitting: Psychiatry

## 2015-09-01 ENCOUNTER — Encounter: Payer: Self-pay | Admitting: Dietician

## 2015-09-01 ENCOUNTER — Encounter: Payer: Medicare Other | Attending: General Surgery | Admitting: Dietician

## 2015-09-01 DIAGNOSIS — Z6841 Body Mass Index (BMI) 40.0 and over, adult: Secondary | ICD-10-CM | POA: Diagnosis not present

## 2015-09-01 DIAGNOSIS — Z713 Dietary counseling and surveillance: Secondary | ICD-10-CM | POA: Insufficient documentation

## 2015-09-01 NOTE — Progress Notes (Signed)
Supervised Weight Loss Class:  Appt start time: 1600   End time:  1630.  Patient was seen on 09/01/2015 for the "Vitamins" Supervised Weight Loss Class at the [facility].   Surgery type: sleeve gastrectomy Start weight at [facility]: 282 lbs on 03/04/15 Weight today: 286.0 lbs Weight change: 0 lbs loss   The following learning objectives were met by the patient during this class:  Objectives: -Review vitamin and Calcium recommendations based on procedure -Identify appropriate types/brands of vitamins and Calcium -Reinforce the need for chewable or liquid supplements  Goals: -Find appropriate supplements that meet taste and budget needs -Begin taking a recommended multivitamin and Calcium supplement  Handouts given: Vitamin Pamplet

## 2015-11-25 ENCOUNTER — Other Ambulatory Visit: Payer: Self-pay | Admitting: Family Medicine

## 2015-11-25 DIAGNOSIS — Z1231 Encounter for screening mammogram for malignant neoplasm of breast: Secondary | ICD-10-CM

## 2015-11-25 DIAGNOSIS — M810 Age-related osteoporosis without current pathological fracture: Secondary | ICD-10-CM

## 2015-12-18 ENCOUNTER — Ambulatory Visit
Admission: RE | Admit: 2015-12-18 | Discharge: 2015-12-18 | Disposition: A | Discharge status: Home / Self Care | Payer: Medicare Other | Source: Ambulatory Visit | Attending: Family Medicine | Admitting: Family Medicine

## 2015-12-18 DIAGNOSIS — M810 Age-related osteoporosis without current pathological fracture: Secondary | ICD-10-CM

## 2015-12-18 DIAGNOSIS — Z1231 Encounter for screening mammogram for malignant neoplasm of breast: Secondary | ICD-10-CM

## 2016-02-02 ENCOUNTER — Encounter: Payer: Medicare Other | Attending: General Surgery | Admitting: Dietician

## 2016-02-02 DIAGNOSIS — K219 Gastro-esophageal reflux disease without esophagitis: Secondary | ICD-10-CM | POA: Insufficient documentation

## 2016-02-02 DIAGNOSIS — M545 Low back pain: Secondary | ICD-10-CM | POA: Insufficient documentation

## 2016-02-02 DIAGNOSIS — J449 Chronic obstructive pulmonary disease, unspecified: Secondary | ICD-10-CM | POA: Insufficient documentation

## 2016-02-02 DIAGNOSIS — E119 Type 2 diabetes mellitus without complications: Secondary | ICD-10-CM | POA: Insufficient documentation

## 2016-02-02 DIAGNOSIS — M1990 Unspecified osteoarthritis, unspecified site: Secondary | ICD-10-CM | POA: Insufficient documentation

## 2016-02-02 DIAGNOSIS — I10 Essential (primary) hypertension: Secondary | ICD-10-CM | POA: Insufficient documentation

## 2016-02-02 DIAGNOSIS — F329 Major depressive disorder, single episode, unspecified: Secondary | ICD-10-CM | POA: Diagnosis not present

## 2016-02-02 DIAGNOSIS — F419 Anxiety disorder, unspecified: Secondary | ICD-10-CM | POA: Diagnosis not present

## 2016-02-02 DIAGNOSIS — Z6841 Body Mass Index (BMI) 40.0 and over, adult: Secondary | ICD-10-CM

## 2016-02-04 NOTE — Progress Notes (Signed)
  Pre-Operative Nutrition Class:  Appt start time: 1975   End time:  1830.  Patient was seen on 02/02/2016 for Pre-Operative Bariatric Surgery Education at the [facility].   Surgery date:  Surgery type: Sleeve gastrectomy Start weight at [facility]: 282 lbs on 03/04/2015 Weight today: 300 lbs  TANITA  BODY COMP RESULTS  02/02/16   BMI (kg/m^2) N/A   Fat Mass (lbs)    Fat Free Mass (lbs)    Total Body Water (lbs)    Samples given per MNT protocol. Patient educated on appropriate usage: Premier protein shake (chocolate - qty 1) Lot #: [id number] Exp: 11/2016  Bariatric Advantage Multivitamin chew (mixed fruit - qty 1) Lot #: [id number] Exp: 11/2016  Bariatric Advantage Calcium Citrate chew (strawberry - qty 1) Lot #: [id number] Exp: 09/2016  Unjury Protein Powder (vanilla - qty 1) Lot #: [id number] Exp: 05/2017   The following the learning objectives were met by the patient during this course:  Identify Pre-Op Dietary Goals and will begin 2 weeks pre-operatively  Identify appropriate sources of fluids and proteins   State protein recommendations and appropriate sources pre and post-operatively  Identify Post-Operative Dietary Goals and will follow for 2 weeks post-operatively  Identify appropriate multivitamin and calcium sources  Describe the need for physical activity post-operatively and will follow MD recommendations  State when to call healthcare provider regarding medication questions or post-operative complications  Handouts given during class include:  Pre-Op Bariatric Surgery Diet Handout  Protein Shake Handout  Post-Op Bariatric Surgery Nutrition Handout  BELT Program Information Flyer  Support Group Information Flyer  [facility] Bariatric Supplements Price List  Follow-Up Plan: Patient will follow-up at [facility] 2 weeks post operatively for diet advancement per MD.

## 2016-02-05 ENCOUNTER — Encounter: Payer: Self-pay | Admitting: Dietician

## 2016-06-28 IMAGING — CR DG CHEST 2V
2 series · 2 of 2 positions shown · non-contrast
Comparison: 03/23/2012 radiographs.

CLINICAL DATA: Right femoral nonunion. Preoperative respiratory
examination for surgical repair.

EXAM:
CHEST  2 VIEW

[w chest pa]
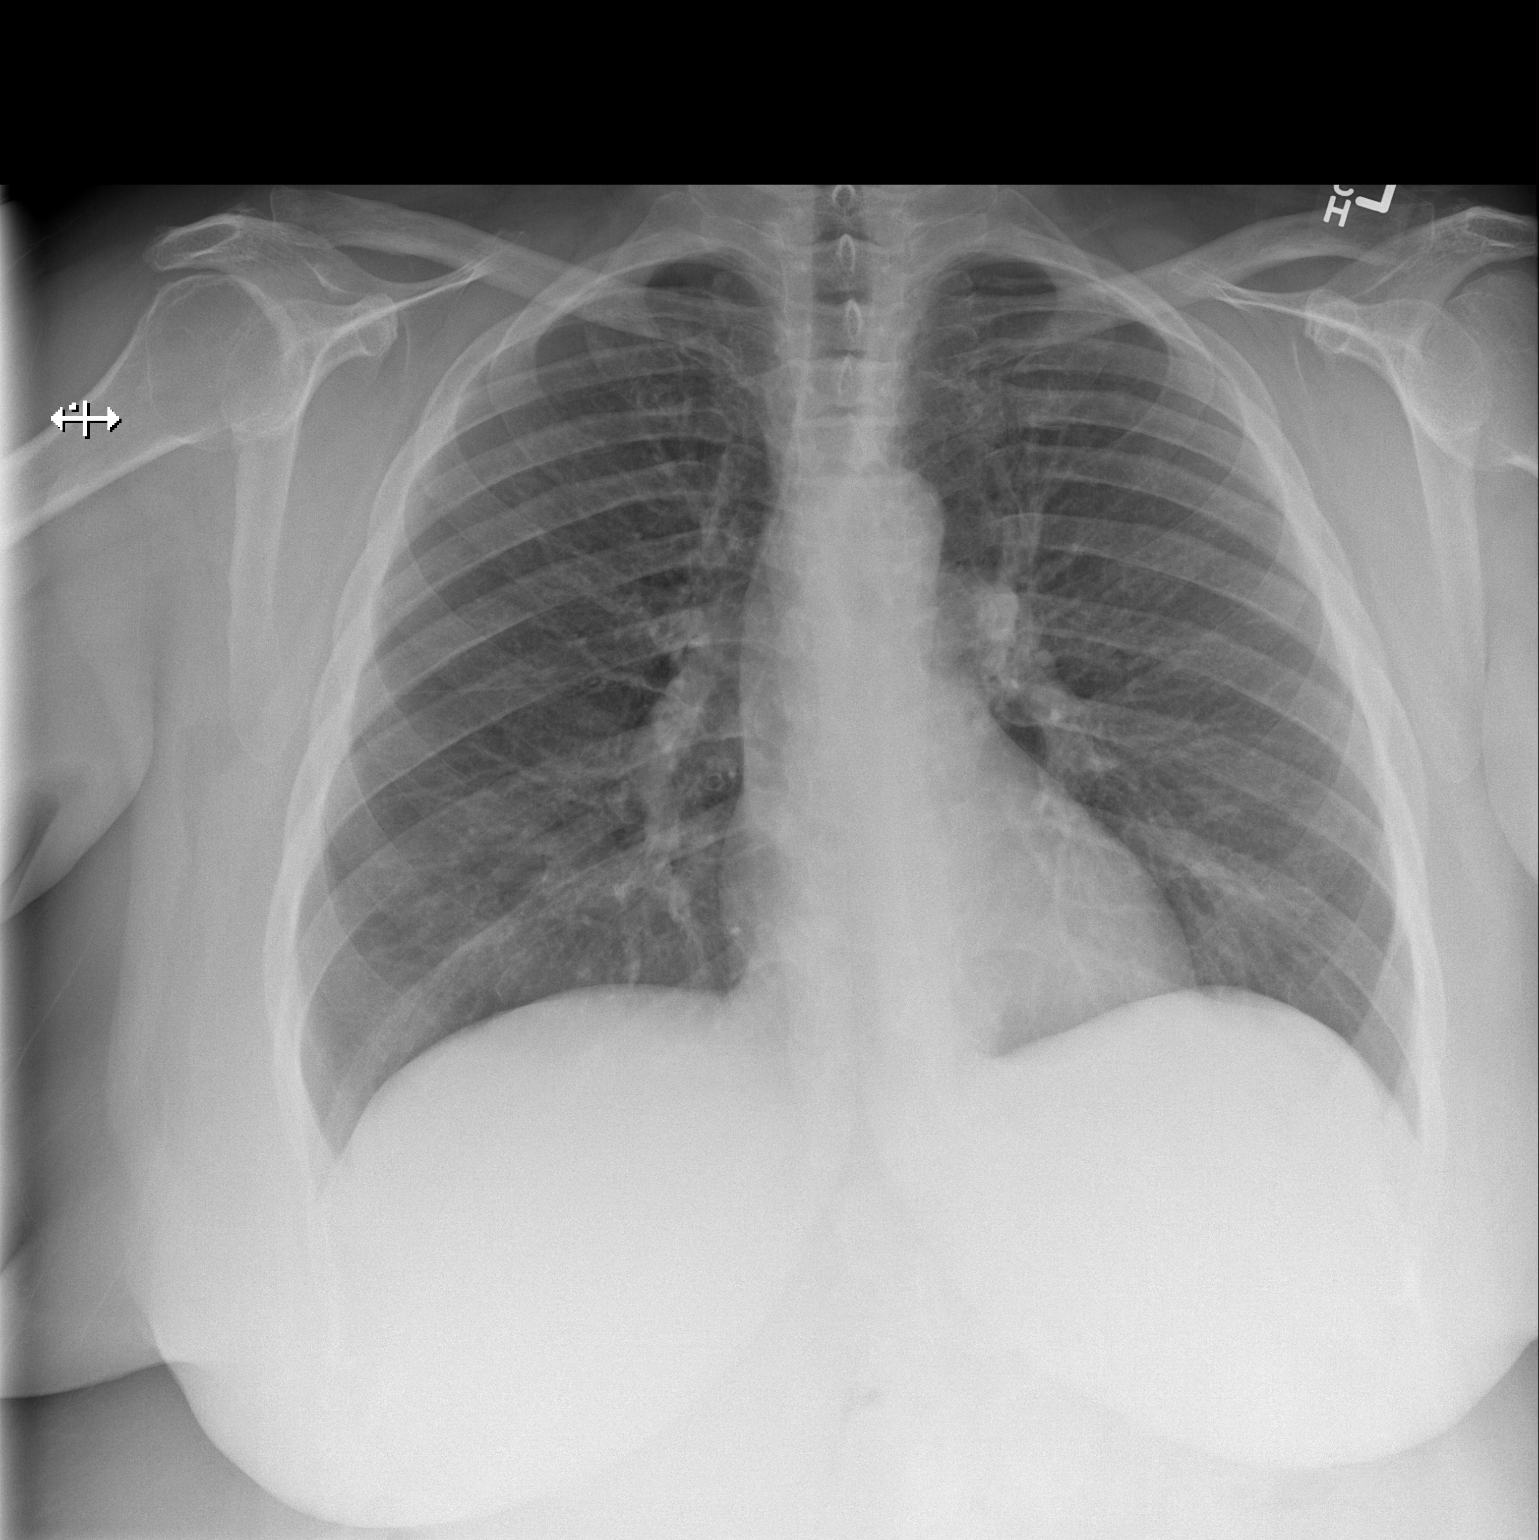

[w chest lat]
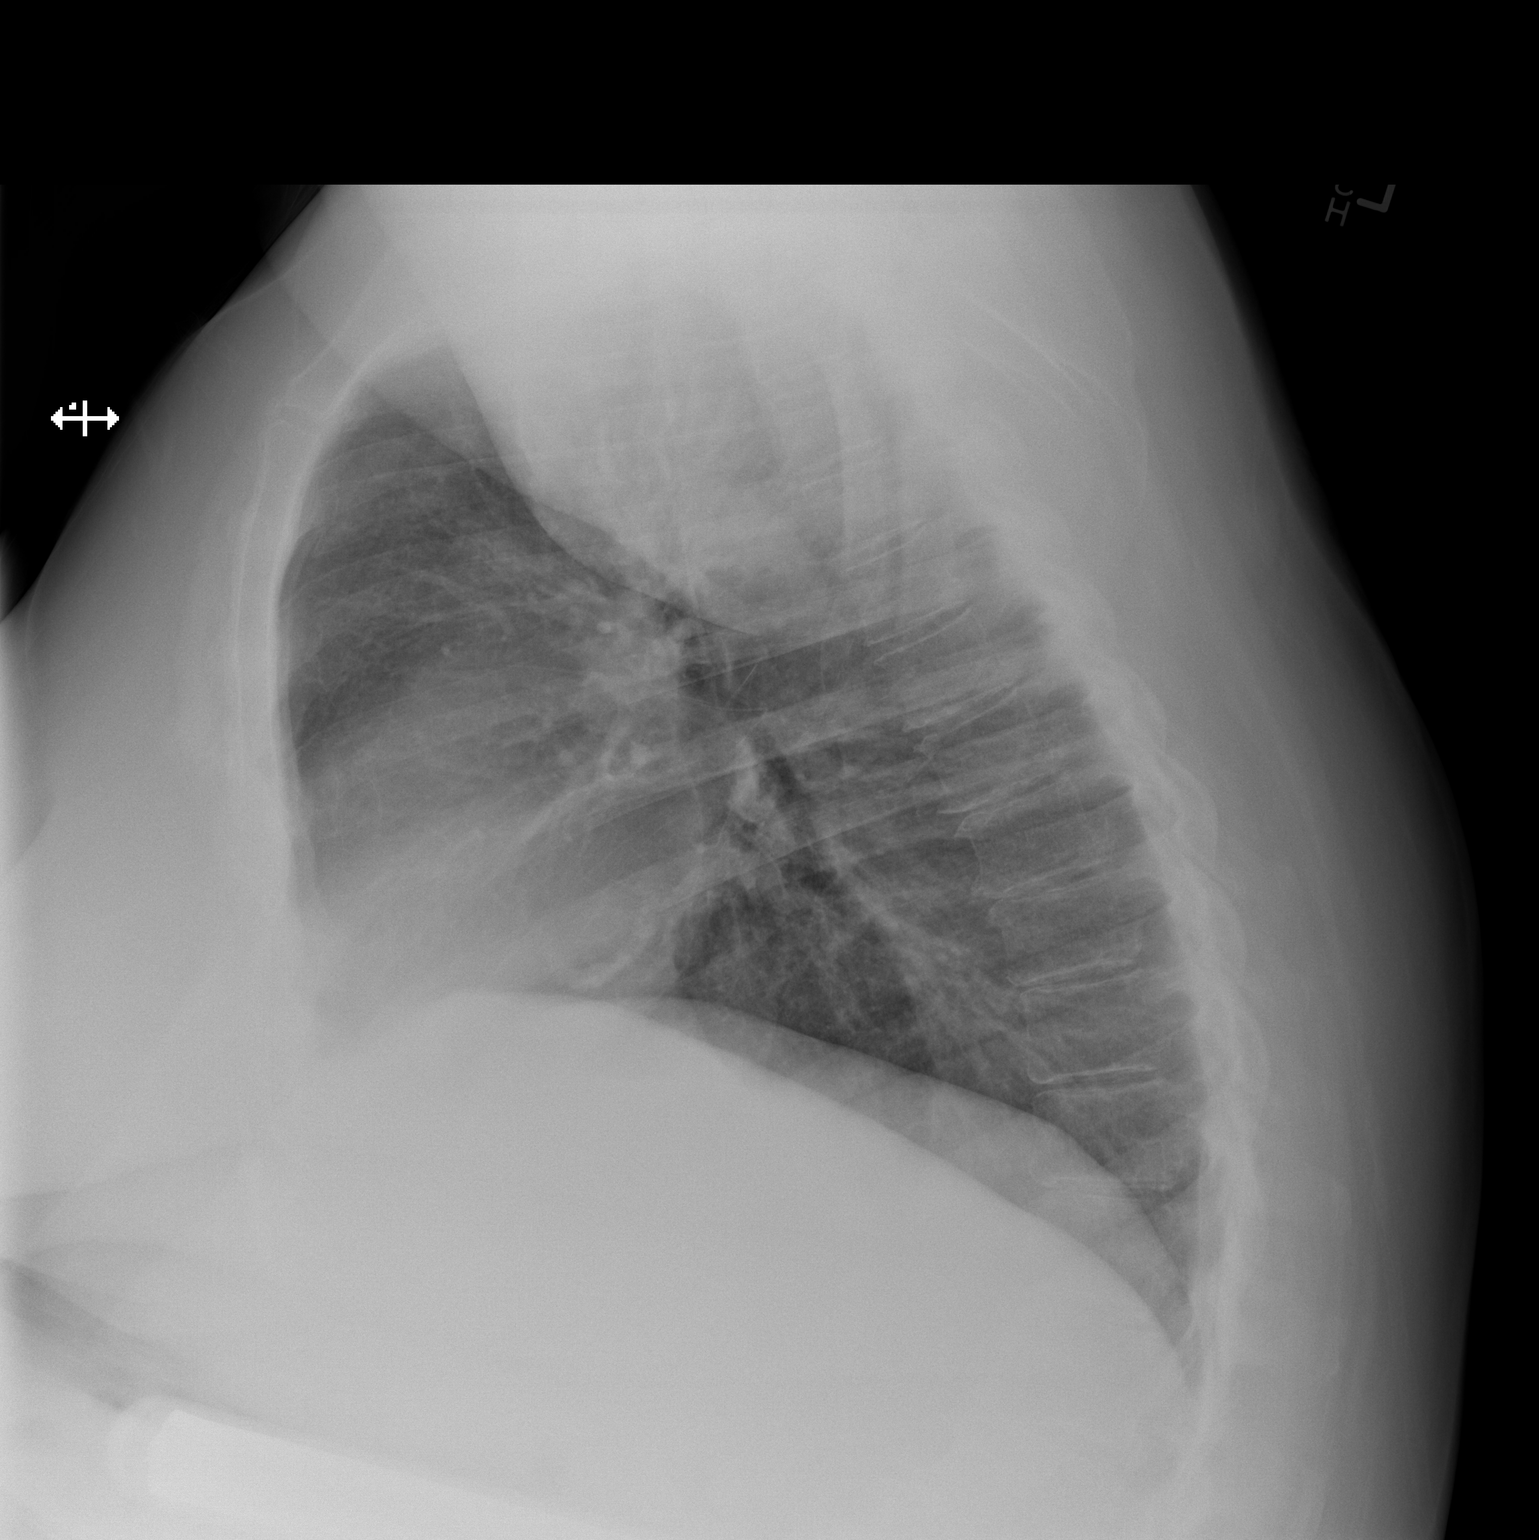

[2 of 2 positions shown; findings below may reference images not displayed]

FINDINGS: The heart size and mediastinal contours are normal. The lungs are
clear. There is no pleural effusion or pneumothorax. No acute
osseous findings are identified.
IMPRESSION: Stable chest.  No active cardiopulmonary process.

## 2016-07-04 IMAGING — RF DG FEMUR 2+V*R*
1 series · 5 of 5 positions shown · non-contrast
Comparison: CT scan of July 30, 2013.

CLINICAL DATA: Right femur revision.

EXAM:
RIGHT FEMUR - 2 VIEW

[Series 1: run · 5 of 5 slices shown]
[im 1/5]
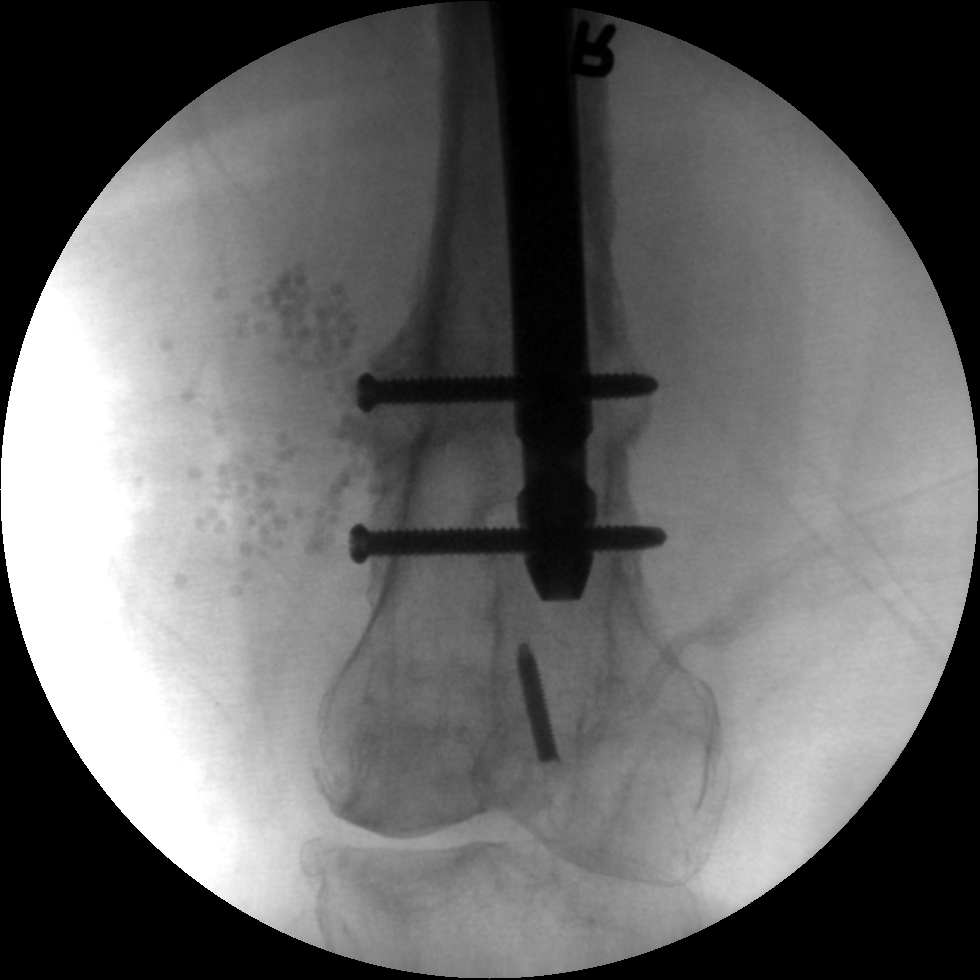
[im 2/5]
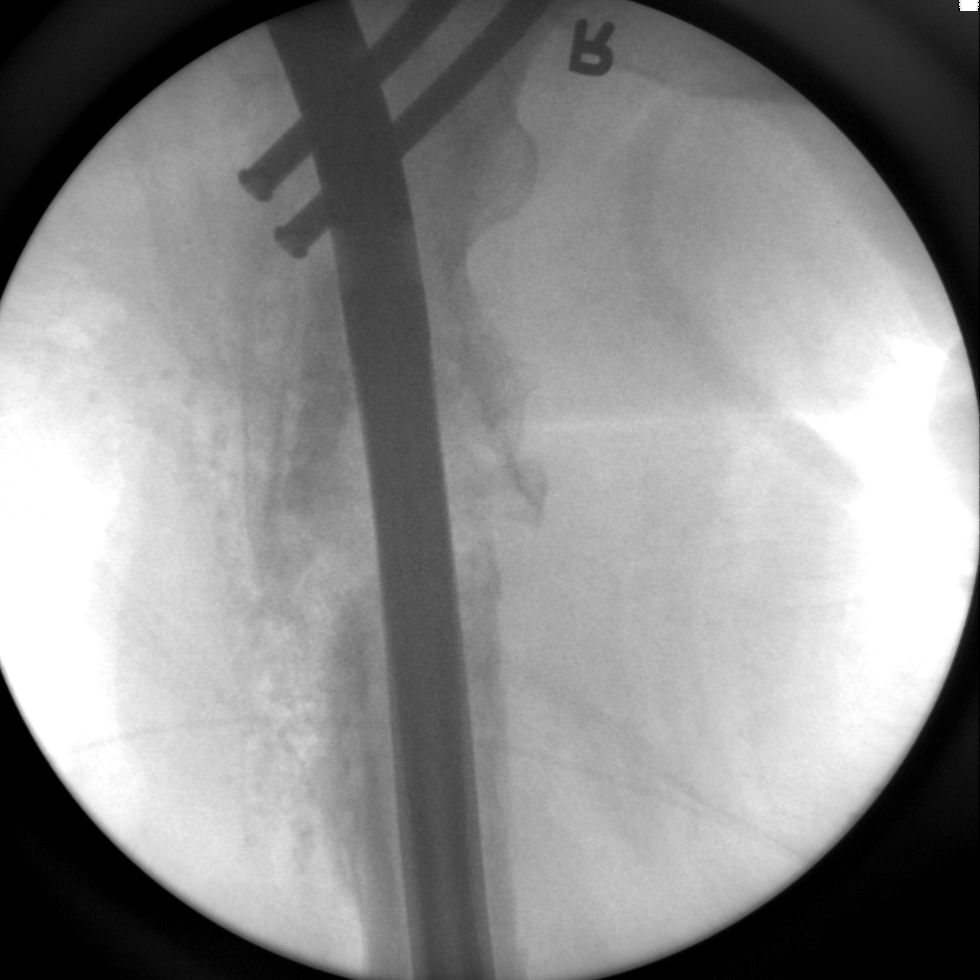
[im 3/5]
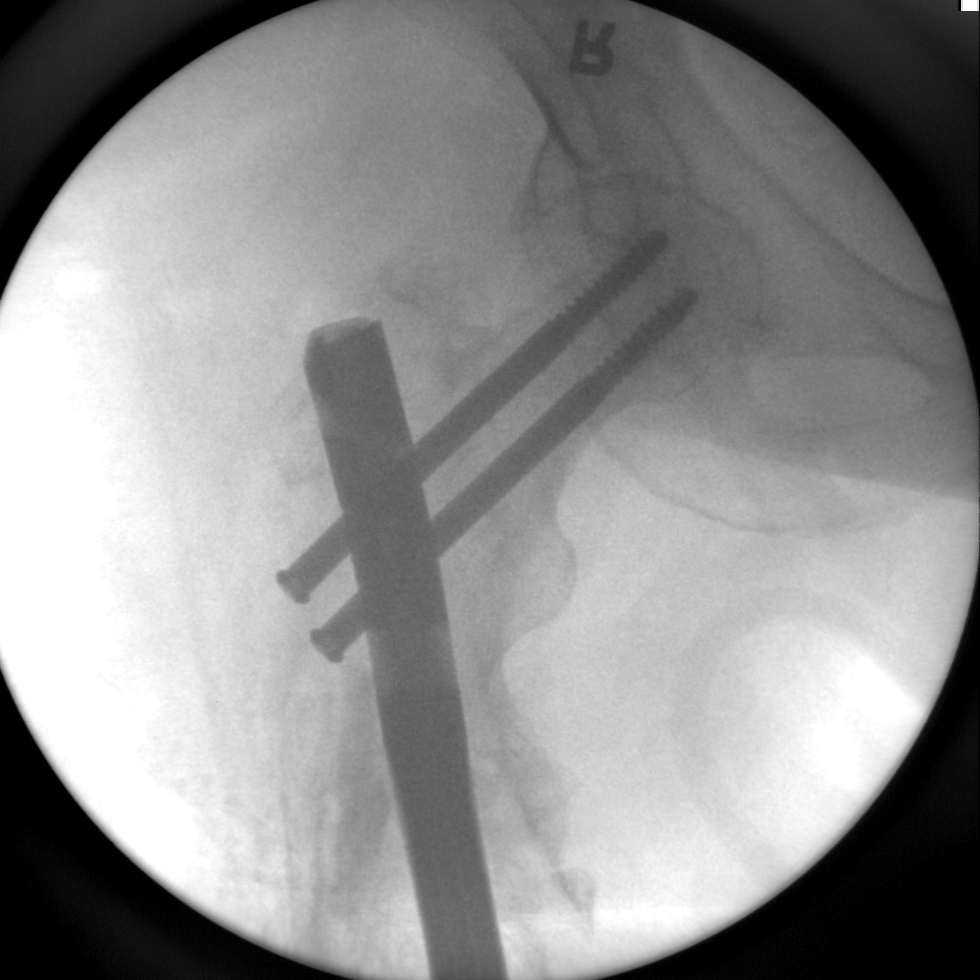
[im 4/5]
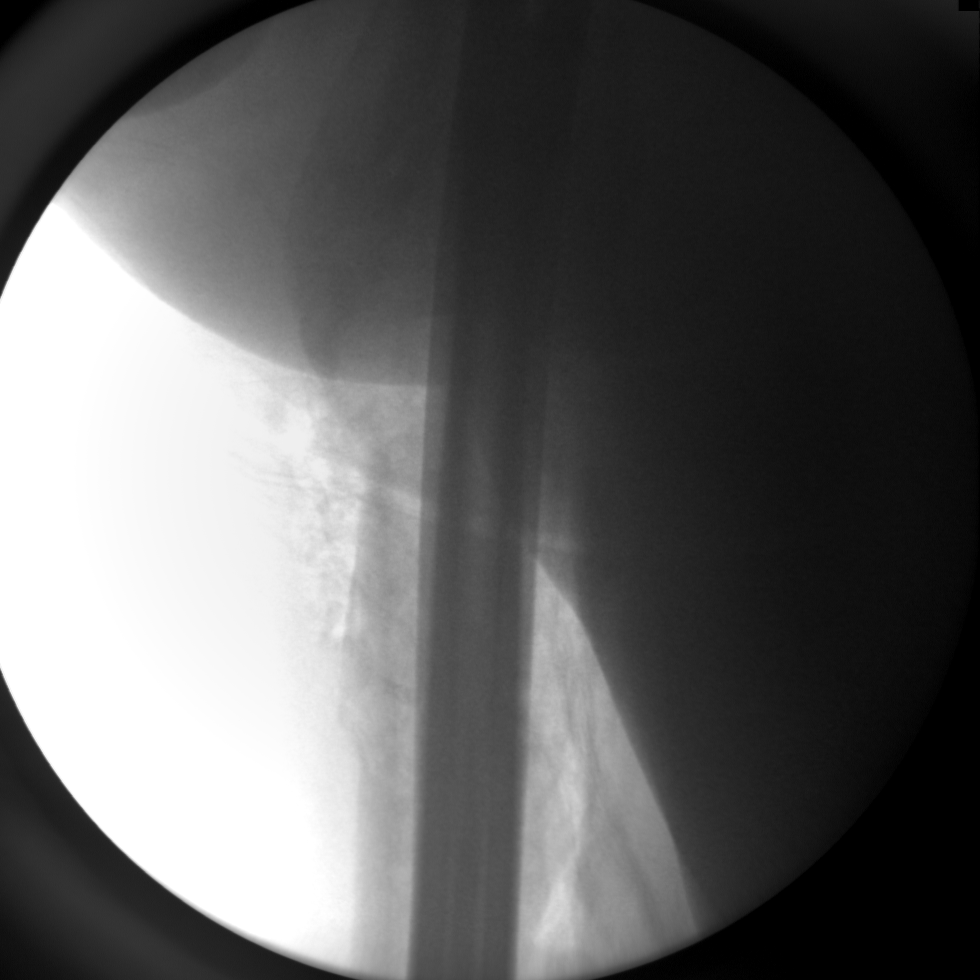
[im 5/5]
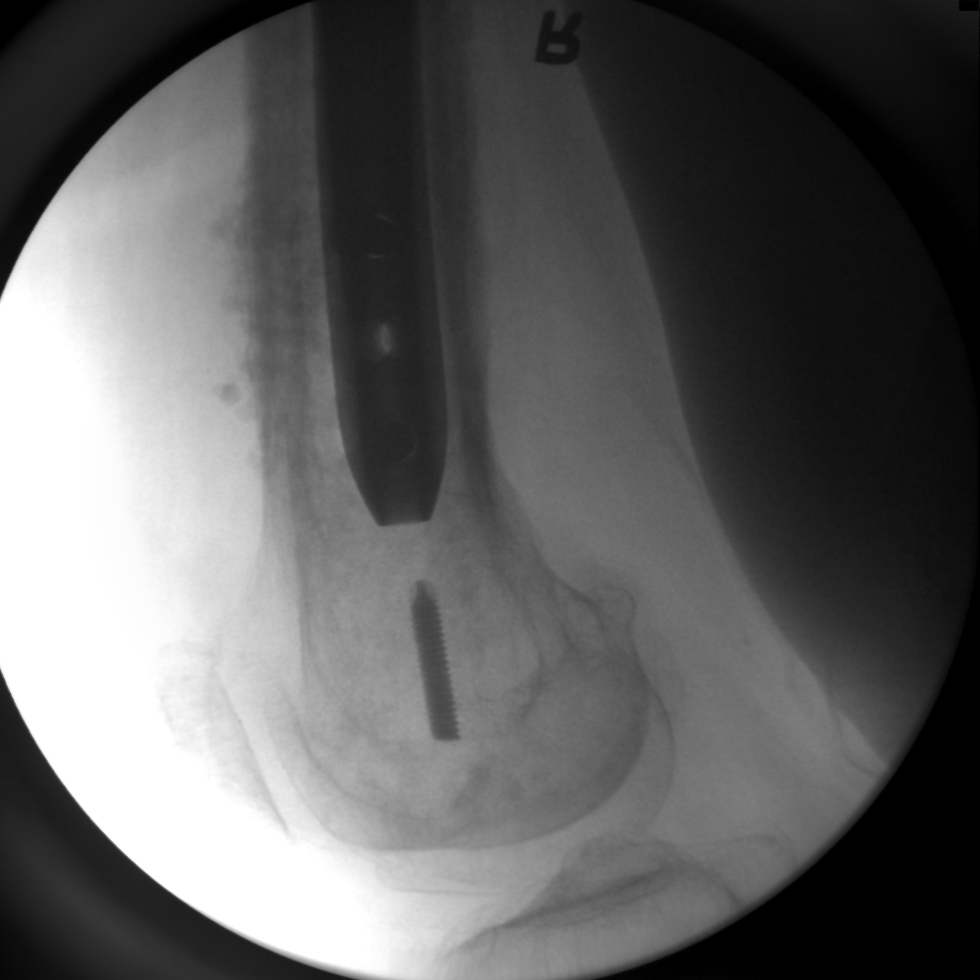

[5 of 5 positions shown; findings below may reference images not displayed]

FINDINGS: Five intraoperative fluoroscopic images of the right femur were
submitted for review. These images demonstrate the patient to be
status post intra medullary rod fixation of old proximal femoral
shaft fracture with persistent nonunion. The large distal screw
noted on prior exam has been replaced with 2 smaller screws. Large
proximal screw has been replaced with 2 smaller screws.
IMPRESSION: Revision of intra medullary rod fixation of old proximal right
femoral shaft fracture with persistent nonunion.

## 2016-11-26 ENCOUNTER — Other Ambulatory Visit: Payer: Self-pay | Admitting: Family Medicine

## 2016-11-26 DIAGNOSIS — Z1231 Encounter for screening mammogram for malignant neoplasm of breast: Secondary | ICD-10-CM

## 2016-12-20 ENCOUNTER — Ambulatory Visit
Admission: RE | Admit: 2016-12-20 | Discharge: 2016-12-20 | Disposition: A | Discharge status: Home / Self Care | Payer: Medicare Other | Source: Ambulatory Visit | Attending: Family Medicine | Admitting: Family Medicine

## 2016-12-20 DIAGNOSIS — Z1231 Encounter for screening mammogram for malignant neoplasm of breast: Secondary | ICD-10-CM

## 2017-01-03 ENCOUNTER — Other Ambulatory Visit: Payer: Self-pay | Admitting: Gastroenterology

## 2017-01-07 ENCOUNTER — Encounter (HOSPITAL_COMMUNITY): Payer: Self-pay | Admitting: *Deleted

## 2017-01-07 NOTE — Progress Notes (Signed)
Patient reports at time of preprocedure history for 01/21/17 procedure that she has recent hx of cockroach bites that she has treated with neosporin and they are healing per patient.

## 2017-01-21 ENCOUNTER — Ambulatory Visit (HOSPITAL_COMMUNITY): Payer: Medicare Other | Admitting: Anesthesiology

## 2017-01-21 ENCOUNTER — Ambulatory Visit (HOSPITAL_COMMUNITY)
Admission: RE | Admit: 2017-01-21 | Discharge: 2017-01-21 | Disposition: A | Discharge status: Home / Self Care | Payer: Medicare Other | Source: Ambulatory Visit | Attending: Gastroenterology | Admitting: Gastroenterology

## 2017-01-21 ENCOUNTER — Encounter (HOSPITAL_COMMUNITY)
Admission: RE | Disposition: A | Discharge status: Home / Self Care | Payer: Self-pay | Source: Ambulatory Visit | Attending: Gastroenterology

## 2017-01-21 ENCOUNTER — Encounter (HOSPITAL_COMMUNITY): Payer: Self-pay | Admitting: *Deleted

## 2017-01-21 DIAGNOSIS — Z888 Allergy status to other drugs, medicaments and biological substances status: Secondary | ICD-10-CM | POA: Diagnosis not present

## 2017-01-21 DIAGNOSIS — Z09 Encounter for follow-up examination after completed treatment for conditions other than malignant neoplasm: Secondary | ICD-10-CM | POA: Diagnosis present

## 2017-01-21 DIAGNOSIS — F419 Anxiety disorder, unspecified: Secondary | ICD-10-CM | POA: Diagnosis not present

## 2017-01-21 DIAGNOSIS — Z8719 Personal history of other diseases of the digestive system: Secondary | ICD-10-CM | POA: Diagnosis not present

## 2017-01-21 DIAGNOSIS — I10 Essential (primary) hypertension: Secondary | ICD-10-CM | POA: Diagnosis not present

## 2017-01-21 DIAGNOSIS — Z87891 Personal history of nicotine dependence: Secondary | ICD-10-CM | POA: Insufficient documentation

## 2017-01-21 DIAGNOSIS — K219 Gastro-esophageal reflux disease without esophagitis: Secondary | ICD-10-CM | POA: Diagnosis not present

## 2017-01-21 DIAGNOSIS — K5730 Diverticulosis of large intestine without perforation or abscess without bleeding: Secondary | ICD-10-CM | POA: Insufficient documentation

## 2017-01-21 DIAGNOSIS — J449 Chronic obstructive pulmonary disease, unspecified: Secondary | ICD-10-CM | POA: Insufficient documentation

## 2017-01-21 DIAGNOSIS — E785 Hyperlipidemia, unspecified: Secondary | ICD-10-CM | POA: Insufficient documentation

## 2017-01-21 DIAGNOSIS — E1142 Type 2 diabetes mellitus with diabetic polyneuropathy: Secondary | ICD-10-CM | POA: Insufficient documentation

## 2017-01-21 DIAGNOSIS — F319 Bipolar disorder, unspecified: Secondary | ICD-10-CM | POA: Insufficient documentation

## 2017-01-21 DIAGNOSIS — G4730 Sleep apnea, unspecified: Secondary | ICD-10-CM | POA: Insufficient documentation

## 2017-01-21 DIAGNOSIS — E669 Obesity, unspecified: Secondary | ICD-10-CM | POA: Insufficient documentation

## 2017-01-21 HISTORY — PX: COLONOSCOPY WITH PROPOFOL: SHX5780

## 2017-01-21 HISTORY — DX: Sleep apnea, unspecified: G47.30

## 2017-01-21 LAB — GLUCOSE, CAPILLARY: Glucose-Capillary: 112 mg/dL — ABNORMAL HIGH (ref 65–99)

## 2017-01-21 SURGERY — COLONOSCOPY WITH PROPOFOL
Anesthesia: Monitor Anesthesia Care

## 2017-01-21 MED ORDER — PROPOFOL 500 MG/50ML IV EMUL
INTRAVENOUS | Status: DC | PRN
  Administered 2017-01-21: 140 ug/kg/min via INTRAVENOUS

## 2017-01-21 MED ORDER — LIDOCAINE 2% (20 MG/ML) 5 ML SYRINGE
INTRAMUSCULAR | Status: DC | PRN
  Administered 2017-01-21: 100 mg via INTRAVENOUS

## 2017-01-21 MED ORDER — LACTATED RINGERS IV SOLN
INTRAVENOUS | Status: DC
  Administered 2017-01-21: 11:00:00 via INTRAVENOUS

## 2017-01-21 MED ORDER — SODIUM CHLORIDE 0.9 % IV SOLN: INTRAVENOUS | Status: DC

## 2017-01-21 MED ORDER — PROPOFOL 10 MG/ML IV BOLUS
INTRAVENOUS | Status: DC | PRN
  Administered 2017-01-21: 20 mg via INTRAVENOUS

## 2017-01-21 MED ORDER — PROPOFOL 10 MG/ML IV BOLUS
INTRAVENOUS | Status: AC
  Filled 2017-01-21: qty 40

## 2017-01-21 MED ORDER — LIDOCAINE 2% (20 MG/ML) 5 ML SYRINGE
INTRAMUSCULAR | Status: AC
  Filled 2017-01-21: qty 5

## 2017-01-21 SURGICAL SUPPLY — 22 items

## 2017-01-21 NOTE — Transfer of Care (Signed)
Immediate Anesthesia Transfer of Care Note  Patient: [name] [name]  Procedure(s) Performed: Procedure(s): COLONOSCOPY WITH PROPOFOL (N/A)  Patient Location: PACU  Anesthesia Type:MAC  Level of Consciousness:  sedated, patient cooperative and responds to stimulation  Airway & Oxygen Therapy:Patient Spontanous Breathing and Patient connected to face mask oxgen  Post-op Assessment:  Report given to PACU RN and Post -op Vital signs reviewed and stable  Post vital signs:  Reviewed and stable  Last Vitals:  Vitals:   01/21/17 1106  BP: (!) 114/51  Pulse: 84  Resp: 15  Temp: 36.7 C  SpO2: 46%    Complications: No apparent anesthesia complications

## 2017-01-21 NOTE — Anesthesia Procedure Notes (Signed)
Date/Time: 01/21/2017 11:23 AM Performed by: Danley Danker L Oxygen Delivery Method: Simple face mask

## 2017-01-21 NOTE — Anesthesia Preprocedure Evaluation (Addendum)
Anesthesia Evaluation  Patient identified by MRN, date of birth, ID band Patient awake    Reviewed: Allergy & Precautions, NPO status , Patient's Chart, lab work & pertinent test results  Airway Mallampati: II  TM Distance: >3 FB Neck ROM: Full    Dental  (+) Dental Advisory Given   Pulmonary shortness of breath and with exertion, sleep apnea and Continuous Positive Airway Pressure Ventilation , COPD,  COPD inhaler, former smoker,    breath sounds clear to auscultation       Cardiovascular hypertension, Pt. on medications  Rhythm:Regular Rate:Normal  TTE 2017 - Grade 1 diastolic dysfx   Neuro/Psych PSYCHIATRIC DISORDERS Anxiety Depression Bipolar Disorder    GI/Hepatic GERD  Medicated and Controlled,  Endo/Other  diabetes, Type 2, Oral Hypoglycemic AgentsMorbid obesity  Renal/GU   negative genitourinary   Musculoskeletal  (+) Arthritis ,   Abdominal   Peds  Hematology  (+) anemia ,   Anesthesia Other Findings   Reproductive/Obstetrics                          Anesthesia Physical  Anesthesia Plan  ASA: III  Anesthesia Plan: MAC   Post-op Pain Management:    Induction: Intravenous  PONV Risk Score and Plan: 2 and Propofol infusion and Treatment may vary due to age or medical condition  Airway Management Planned: Nasal Cannula  Additional Equipment: None  Intra-op Plan:   Post-operative Plan:   Informed Consent: I have reviewed the patients History and Physical, chart, labs and discussed the procedure including the risks, benefits and alternatives for the proposed anesthesia with the patient or authorized representative who has indicated his/her understanding and acceptance.   Dental advisory given  Plan Discussed with: CRNA  Anesthesia Plan Comments: (Ginger ale around 8am)       Anesthesia Quick Evaluation

## 2017-01-21 NOTE — H&P (Signed)
[name] [name] HPI: At this time the patient denies any problems with nausea, vomiting, fevers, chills, abdominal pain, diarrhea, constipation, hematochezia, melena, GERD, or dysphagia. The patient denies any known family history of colon cancers. No complaints of chest pain, SOB, MI, or sleep apnea. 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  Past Medical History:  Diagnosis Date  . Anemia   . Anxiety   . Arthritis   . Bipolar disorder ([place])   . Bronchitis   . COPD (chronic obstructive pulmonary disease) ([place])   . Depression   . Diabetes mellitus    type II  . Encounter for blood transfusion   . GERD (gastroesophageal reflux disease)   . Hearing loss   . Hyperlipemia   . Hypertension   . Nonunion, 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  . Peripheral neuropathy   . Pneumonia    hx of   . Shortness of breath    with exertion   . Skin yeast infection    history of under bilateral breast,, pannus, inner legs  . Sleep apnea    uses bipap- does not know setting s    Past Surgical History:  Procedure Laterality Date  . COLONOSCOPY  10/22/2011   Procedure: COLONOSCOPY;  Surgeon: Beryle Beams, MD;  [facility];  Service: Endoscopy;  Laterality: N/A;  . ESOPHAGOGASTRODUODENOSCOPY  10/22/2011   Procedure: ESOPHAGOGASTRODUODENOSCOPY (EGD);  Surgeon: Beryle Beams, MD;  Location: [facility];  Service: Endoscopy;  Laterality: N/A;  . femur fx    . FEMUR IM NAIL Right 01/08/2014   Procedure: INTRAMEDULLARY (IM) NAIL RIGHT HIP WITH INFUSED ALLOGRAFTING;  Surgeon: Rozanna Box, MD;  Location: [place];  Service: Orthopedics;  Laterality: Right;  . HARDWARE REMOVAL  03/27/2012   Procedure: HARDWARE REMOVAL;  Surgeon: Mauri Pole, MD;  [facility]: [facility];  Service: Orthopedics;  Laterality: Right;  . HARDWARE REMOVAL Right 01/08/2014   Procedure: HARDWARE REMOVAL RIGHT PROXIMAL FEMUR;  Surgeon: Rozanna Box, MD;  Location: [place];  Service:  Orthopedics;  Laterality: Right;  . HIP FRACTURE SURGERY    . INTRAMEDULLARY (IM) NAIL INTERTROCHANTERIC  03/27/2012   Procedure: INTRAMEDULLARY (IM) NAIL INTERTROCHANTRIC;  Surgeon: Mauri Pole, MD;  [facility];  Service: Orthopedics;  Laterality: Right;  Possible Exchange with a Shorter Trochantric Nail  . TONSILLECTOMY      Family History  Problem Relation Age of Onset  . Arrhythmia Mother   . Heart failure Mother   . Diabetes Mellitus II Mother   . Leukemia Father   . Heart attack Father   . Heart disease Father   . Heart disease Sister   . COPD Sister   . Heart disease Brother   . Other Sister        Factor 5  . Leukemia Sister   . Other Brother        "instant death"    Social History:  reports that she quit smoking about 9 years ago. She has never used smokeless tobacco. She reports that she does not drink alcohol or use drugs.  Allergies:  Allergies  Allergen Reactions  . Coffee Flavor Nausea And Vomiting    Per pt she is "allergic" to the smell of coffee  . Prednisone Other (See Comments)    Tightness in chest  . Other Other (See Comments)    Plain nystatin ointment-makes skin get worse Patient is allergic to any petroleum based ointment  Medications:  Scheduled:  Continuous: . sodium chloride    . lactated ringers      No results found for this or any previous visit (from the past 24 hour(s)).   No results found.  ROS:  As stated above in the HPI otherwise negative.  There were no vitals taken for this visit.    PE: Gen: NAD, Alert and Oriented HEENT:  [place]/AT, EOMI Neck: Supple, no LAD Lungs: CTA Bilaterally CV: RRR without M/G/R ABM: Soft, NTND, +BS Ext: No C/C/E  Assessment/Plan: 1) Personal history of polyps - colonoscopy.  [name] [name] D 01/21/2017, 11:07 AM

## 2017-01-21 NOTE — Discharge Instructions (Signed)

## 2017-01-21 NOTE — Op Note (Signed)
[facility] Patient Name: [name] [name] Procedure Date: 01/21/2017 MRN: [id number] Attending MD: Carol Ada , MD Date of Birth: 08-05-1950 CSN: [id number] Age: 66 Admit Type: Outpatient Procedure:                Colonoscopy Indications:              High risk colon cancer surveillance: Personal                            history of colonic polyps Providers:                Carol Ada, MD, Cleda Daub, RN, William Dalton, Technician Referring MD:              Medicines:                Propofol per Anesthesia Complications:            No immediate complications. Estimated Blood Loss:     Estimated blood loss: none. Procedure:                Pre-Anesthesia Assessment:                           - Prior to the procedure, a History and Physical                            was performed, and patient medications and                            allergies were reviewed. The patient's tolerance of                            previous anesthesia was also reviewed. The risks                            and benefits of the procedure and the sedation                            options and risks were discussed with the patient.                            All questions were answered, and informed consent                            was obtained. Prior Anticoagulants: The patient has                            taken no previous anticoagulant or antiplatelet                            agents. ASA Grade Assessment: III - A patient with                            severe systemic  disease. After reviewing the risks                            and benefits, the patient was deemed in                            satisfactory condition to undergo the procedure.                           - Sedation was administered by an anesthesia                            professional. Deep sedation was attained.                           After obtaining informed consent, the  colonoscope                            was passed under direct vision. Throughout the                            procedure, the patient's blood pressure, pulse, and                            oxygen saturations were monitored continuously. The                            EC-[id number] ([id number]) scope was introduced through                            the anus and advanced to the the cecum, identified                            by appendiceal orifice and ileocecal valve. The                            colonoscopy was somewhat difficult due to                            significant looping and the patient's body habitus.                            Successful completion of the procedure was aided by                            straightening and shortening the scope to obtain                            bowel loop reduction. The patient tolerated the                            procedure well. The quality of the bowel  preparation was good. The ileocecal valve,                            appendiceal orifice, and rectum were photographed. Scope In: 11:32:49 AM Scope Out: 11:50:19 AM Scope Withdrawal Time: 0 hours 11 minutes 20 seconds  Total Procedure Duration: 0 hours 17 minutes 30 seconds  Findings:      Scattered small and large-mouthed diverticula were found in the sigmoid       colon and descending colon. Impression:               - Diverticulosis in the sigmoid colon and in the                            descending colon.                           - No specimens collected. Moderate Sedation:      N/A- Per Anesthesia Care Recommendation:           - Patient has a contact number available for                            emergencies. The signs and symptoms of potential                            delayed complications were discussed with the                            patient. Return to normal activities tomorrow.                            Written discharge instructions  were provided to the                            patient.                           - Resume previous diet.                           - Continue present medications.                           - Repeat colonoscopy in 7 years for surveillance. Procedure Code(s):        --- Professional ---                           N3976, Colorectal cancer screening; colonoscopy on                            individual at high risk Diagnosis Code(s):        --- Professional ---                           Z86.010, Personal history of colonic polyps  K57.30, Diverticulosis of large intestine without                            perforation or abscess without bleeding CPT copyright 2016 [employer] Association. All rights reserved. The codes documented in this report are preliminary and upon coder review may  be revised to meet current compliance requirements. Carol Ada, MD Carol Ada, MD 01/21/2017 11:55:18 AM This report has been signed electronically. Number of Addenda: 0

## 2017-01-21 NOTE — Anesthesia Postprocedure Evaluation (Signed)
Anesthesia Post Note  Patient: [name] [name]  Procedure(s) Performed: Procedure(s) (LRB): COLONOSCOPY WITH PROPOFOL (N/A)     Patient location during evaluation: PACU Anesthesia Type: MAC Level of consciousness: awake and alert Pain management: pain level controlled Vital Signs Assessment: post-procedure vital signs reviewed and stable Respiratory status: spontaneous breathing, nonlabored ventilation and respiratory function stable Cardiovascular status: stable and blood pressure returned to baseline Anesthetic complications: no    Last Vitals:  Vitals:   01/21/17 1225 01/21/17 1230  BP:  93/66  Pulse:  77  Resp:  15  Temp:    SpO2: 97% 97%    Last Pain:  Vitals:   01/21/17 1156  TempSrc: Oral                 Audry Pili

## 2017-01-24 ENCOUNTER — Encounter (HOSPITAL_COMMUNITY): Payer: Self-pay | Admitting: Gastroenterology

## 2018-03-02 ENCOUNTER — Other Ambulatory Visit: Payer: Self-pay | Admitting: Family Medicine

## 2018-03-02 DIAGNOSIS — Z1231 Encounter for screening mammogram for malignant neoplasm of breast: Secondary | ICD-10-CM

## 2018-04-05 ENCOUNTER — Ambulatory Visit
Admission: RE | Admit: 2018-04-05 | Discharge: 2018-04-05 | Disposition: A | Discharge status: Home / Self Care | Payer: Medicare Other | Source: Ambulatory Visit | Attending: Family Medicine | Admitting: Family Medicine

## 2018-04-05 DIAGNOSIS — Z1231 Encounter for screening mammogram for malignant neoplasm of breast: Secondary | ICD-10-CM

## 2018-11-28 ENCOUNTER — Other Ambulatory Visit: Payer: Self-pay

## 2018-11-28 ENCOUNTER — Emergency Department (HOSPITAL_COMMUNITY)
Admission: EM | Admit: 2018-11-28 | Discharge: 2018-11-28 | Disposition: A | Discharge status: Home / Self Care | Payer: Medicare Other | Attending: Emergency Medicine | Admitting: Emergency Medicine

## 2018-11-28 DIAGNOSIS — Z87891 Personal history of nicotine dependence: Secondary | ICD-10-CM | POA: Insufficient documentation

## 2018-11-28 DIAGNOSIS — E119 Type 2 diabetes mellitus without complications: Secondary | ICD-10-CM | POA: Diagnosis not present

## 2018-11-28 DIAGNOSIS — J449 Chronic obstructive pulmonary disease, unspecified: Secondary | ICD-10-CM | POA: Diagnosis not present

## 2018-11-28 DIAGNOSIS — Z79899 Other long term (current) drug therapy: Secondary | ICD-10-CM | POA: Insufficient documentation

## 2018-11-28 DIAGNOSIS — Z7982 Long term (current) use of aspirin: Secondary | ICD-10-CM | POA: Diagnosis not present

## 2018-11-28 DIAGNOSIS — R070 Pain in throat: Secondary | ICD-10-CM | POA: Diagnosis present

## 2018-11-28 DIAGNOSIS — I10 Essential (primary) hypertension: Secondary | ICD-10-CM | POA: Diagnosis not present

## 2018-11-28 DIAGNOSIS — J392 Other diseases of pharynx: Secondary | ICD-10-CM | POA: Diagnosis not present

## 2018-11-28 MED ORDER — ALUM & MAG HYDROXIDE-SIMETH 200-200-20 MG/5ML PO SUSP
30.0000 mL | Freq: Once | ORAL | Status: AC
  Administered 2018-11-28: 30 mL via ORAL
  Filled 2018-11-28: qty 30

## 2018-11-28 MED ORDER — LIDOCAINE VISCOUS HCL 2 % MT SOLN
15.0000 mL | Freq: Once | OROMUCOSAL | Status: AC
  Administered 2018-11-28: 15 mL via ORAL
  Filled 2018-11-28: qty 15

## 2018-11-28 NOTE — ED Provider Notes (Signed)
[name] [name] DEPT Provider Note   CSN: [id number] Arrival date & time: 11/28/18  9628    History   Chief Complaint Chief Complaint  Patient presents with  . Aspiration    HPI [name] [name] is a 68 y.o. female history of bronchitis, COPD, depression, diabetes who presents for evaluation of throat irritation.  Patient reports that she was taking her iron pill this morning and states that she felt like it got stuck in her trachea.  She states that she coughed and was having irritation of her throat.  She called her primary care doctor who advised that she come to the emergency department for further evaluation.  Patient states that she used her inhaler that she uses for her COPD which she felt helped and she was able to cough the pill back up.  She states that since then, she has eaten a brownie and and has had no difficulty swallowing it.  She states that she has had some residual coughing but no difficulty breathing.  She also reports some irritation to her throat.  Patient denies any vomiting, trouble breathing.     The history is provided by the patient.    Past Medical History:  Diagnosis Date  . Anemia   . Anxiety   . Arthritis   . Bipolar disorder ([place])   . Bronchitis   . COPD (chronic obstructive pulmonary disease) ([place])   . Depression   . Diabetes mellitus    type II  . Encounter for blood transfusion   . GERD (gastroesophageal reflux disease)   . Hearing loss   . Hyperlipemia   . Hypertension   . Nonunion, 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  . Peripheral neuropathy   . Pneumonia    hx of   . Shortness of breath    with exertion   . Skin yeast infection    history of under bilateral breast,, pannus, inner legs  . Sleep apnea    uses bipap- does not know setting s    Patient Active Problem List   Diagnosis Date Noted  . Type 2 diabetes mellitus without complication ([place])   . Peripheral neuropathy   . Closed  displaced fracture of right femoral neck with nonunion 01/08/2014  . COPD (chronic obstructive pulmonary disease) ([place])   . Hypertension   . GERD (gastroesophageal reflux disease)   . Depression   . Bipolar disorder ([place])   . Anxiety   . Acute blood loss anemia 03/28/2012  . Morbid obesity with BMI of 45.0-49.9, adult ([place]) 03/28/2012  . Hyponatremia 03/28/2012  . Retained hardware right hip 03/28/2012    Past Surgical History:  Procedure Laterality Date  . COLONOSCOPY  10/22/2011   Procedure: COLONOSCOPY;  Surgeon: Beryle Beams, MD;  Location: [facility];  Service: Endoscopy;  Laterality: N/A;  . COLONOSCOPY WITH PROPOFOL N/A 01/21/2017   Procedure: COLONOSCOPY WITH PROPOFOL;  Surgeon: Carol Ada, MD;  Location: [facility];  Service: Endoscopy;  Laterality: N/A;  . ESOPHAGOGASTRODUODENOSCOPY  10/22/2011   Procedure: ESOPHAGOGASTRODUODENOSCOPY (EGD);  Surgeon: Beryle Beams, MD;  Location: [facility];  Service: Endoscopy;  Laterality: N/A;  . femur fx    . FEMUR IM NAIL Right 01/08/2014   Procedure: INTRAMEDULLARY (IM) NAIL RIGHT HIP WITH INFUSED ALLOGRAFTING;  Surgeon: Rozanna Box, MD;  Location: [place];  Service: Orthopedics;  Laterality: Right;  . HARDWARE REMOVAL  03/27/2012   Procedure: HARDWARE REMOVAL;  Surgeon: Mauri Pole, MD;  Location:  [facility];  Service: Orthopedics;  Laterality: Right;  . HARDWARE REMOVAL Right 01/08/2014   Procedure: HARDWARE REMOVAL RIGHT PROXIMAL FEMUR;  Surgeon: Rozanna Box, MD;  Location: [place];  Service: Orthopedics;  Laterality: Right;  . HIP FRACTURE SURGERY    . INTRAMEDULLARY (IM) NAIL INTERTROCHANTERIC  03/27/2012   Procedure: INTRAMEDULLARY (IM) NAIL INTERTROCHANTRIC;  Surgeon: Mauri Pole, MD;  Location: [facility];  Service: Orthopedics;  Laterality: Right;  Possible Exchange with a Shorter Trochantric Nail  . TONSILLECTOMY       OB History   No obstetric history on file.      Home Medications    Prior to Admission  medications   Medication Sig Start Date End Date Taking? Authorizing Provider  acetaminophen (TYLENOL) 650 MG CR tablet Take 1,950 mg by mouth 2 (two) times daily.    [provider]  aspirin EC 81 MG tablet Take 81 mg by mouth daily.    [provider]  Calcium Carbonate-Vitamin D (CALTRATE 600+D PO) Take 2 tablets by mouth every evening.     [provider]  Carboxymethylcellul-Glycerin (REFRESH OPTIVE OP) Apply 4-5 drops to eye daily as needed (dry eyes).     [provider]  cholecalciferol (VITAMIN D) 1000 UNITS tablet Take 2,000 Units by mouth daily.     [provider]  clotrimazole-betamethasone (LOTRISONE) cream Apply 1 application topically daily as needed (sores).    [provider]  docusate sodium 100 MG CAPS Take 100 mg by mouth 2 (two) times daily. 03/30/12   Danae Orleans, PA-C  ferrous sulfate 325 (65 FE) MG tablet Take 650 mg by mouth daily.     [provider]  gabapentin (NEURONTIN) 600 MG tablet Take 600-1,200 mg by mouth 2 (two) times daily. Take 600 mg by mouth in the morning and take 1200 mg by mouth in the evening.    [provider]  HYDROcodone-acetaminophen (NORCO/VICODIN) 5-325 MG tablet Take 1 tablet by mouth daily as needed for moderate pain. (pain)    [provider]  Ipratropium-Albuterol (COMBIVENT RESPIMAT) 20-100 MCG/ACT AERS respimat Inhale 2 puffs into the lungs 2 (two) times daily as needed for wheezing.     [provider]  lisinopril (PRINIVIL,ZESTRIL) 20 MG tablet Take 20 mg by mouth daily.     [provider]  metFORMIN (GLUCOPHAGE) 500 MG tablet Take 500 mg by mouth 2 (two) times daily.     [provider]  Multiple Vitamins-Minerals (CENTRUM SILVER ULTRA WOMENS) TABS Take 1 tablet by mouth 2 (two) times daily.     [provider]  Multiple Vitamins-Minerals ([place]) CAPS Take 1 capsule by mouth daily.     [provider]  neomycin-polymyxin-pramoxine (NEOSPORIN PLUS) 1 % cream Apply 1 application topically as needed (wound care).    [provider]  Omega-3 Fatty Acids (OMEGA 3 500 PO) Take 500 mg by mouth 2 (two) times daily.    [provider]  omeprazole (PRILOSEC) 20 MG capsule Take 20 mg by mouth at bedtime.     [provider]    Family History Family History  Problem Relation Age of Onset  . Arrhythmia Mother   . Heart failure Mother   . Diabetes Mellitus II Mother   . Leukemia Father   . Heart attack Father   . Heart disease Father   . Heart disease Sister   . COPD Sister   . Heart disease Brother   .  Other Sister        Factor 5  . Leukemia Sister   . Other Brother        "instant death"    Social History Social History   Tobacco Use  . Smoking status: Former Smoker    Quit date: 01/22/2008    Years since quitting: 10.8  . Smokeless tobacco: Never Used  Substance Use Topics  . Alcohol use: No  . Drug use: No     Allergies   Coffee flavor, Prednisone, and Other   Review of Systems Review of Systems  HENT: Positive for sore throat.   Respiratory: Positive for cough. Negative for shortness of breath.   Gastrointestinal: Negative for nausea and vomiting.  All other systems reviewed and are negative.    Physical Exam Updated Vital Signs BP 135/78   Pulse 97   Temp 98.4 F (36.9 C) (Oral)   Resp 20   SpO2 99%   Physical Exam Vitals signs and nursing note reviewed.  Constitutional:      Appearance: She is well-developed.  HENT:     Head: Normocephalic and atraumatic.     Mouth/Throat:     Lips: Pink.     Mouth: Mucous membranes are moist.     Comments: Posterior oropharynx is without any abnormalities  Eyes:     General: No scleral icterus.       Right eye: No discharge.        Left eye: No discharge.     Conjunctiva/sclera: Conjunctivae normal.  Neck:     Comments: No crepitus or edema noted.  No tracheal  deviation.  Airways patent, phonation is intact. Pulmonary:     Effort: Pulmonary effort is normal.     Comments: Lungs clear to auscultation bilaterally.  Symmetric chest rise.  No wheezing, rales, rhonchi. No evidence of respiratory distress.  Skin:    General: Skin is warm and dry.  Neurological:     Mental Status: She is alert.  Psychiatric:        Speech: Speech normal.        Behavior: Behavior normal.      ED Treatments / Results  Labs (all labs ordered are listed, but only abnormal results are displayed) Labs Reviewed - No data to display  EKG None  Radiology No results found.  Procedures Procedures (including critical care time)  Medications Ordered in ED Medications  alum & mag hydroxide-simeth (MAALOX/MYLANTA) 200-200-20 MG/5ML suspension 30 mL (30 mLs Oral Given 11/28/18 1051)    And  lidocaine (XYLOCAINE) 2 % viscous mouth solution 15 mL (15 mLs Oral Given 11/28/18 1051)     Initial Impression / Assessment and Plan / ED Course  I have reviewed the triage vital signs and the nursing notes.  Pertinent labs & imaging results that were available during my care of the patient were reviewed by me and considered in my medical decision making (see chart for details).  Clinical Course as of Nov 28 1454  Tue Nov 27, 7868  3252 68 year old female here after choking on an iron pill that she swallowed this morning.  She was able to cough it out while in the waiting room.  She still has a little bit of cough but she said the burning and discomfort have improved.  Sats 99%.  Likely discharge if it continues to improve.   [MB]    Clinical Course User Index [MB] Hayden Rasmussen, MD       68 year old female who  presents for evaluation of throat irritation and cough after swallowing a pill.  She reports that associated throat irritation and coughing but denies any difficulty breathing, vomiting. about 7:30 AM this morning, she took an iron pill and felt like it got  stuck.  She reports that she coughed initially and used her inhaler.  She called her PCP who advised her to come to the emergency department for further evaluation.  Patient reports that while she was in the waiting room, she coughed the pill up.  She has eaten a brownie since then and has not had any difficulty swallowing.  She reports some throat irritation and coughing but denies any difficulty breathing, vomiting. Patient is afebrile, non-toxic appearing, sitting comfortably on examination table. Vital signs reviewed and stable.  O2 sat stable.  Lungs clear to auscultation.  No evidence of respiratory distress.  No crepitus or edema noted of neck.  Plan for GI cocktail and reevaluate.  Patient does report that her primary care doctor advised her to come to emergency department to get a chest x-ray to see if the pill was still there.  Patient with pill intact in a napkin that she had she coughed up in the lobby.  I discussed with patient regarding obtaining a chest x-ray here in the ED.  I discussed with patient that my suspicion for aspiration pneumonia is very low given the short amount of time.  I did offer a chest x-ray here in the ED but patient declined.  Reevaluation after GI cocktail.  Patient reports improvement in burning sensation in throat.  She has been able to tolerate p.o. in the department any difficulty.  She denies any trouble breathing.  Vitals are stable.  At this time, patient stable for discharge. At this time, patient exhibits no emergent life-threatening condition that require further evaluation in ED or admission. Patient had ample opportunity for questions and discussion. All patient's questions were answered with full understanding. Strict return precautions discussed. Patient expresses understanding and agreement to plan.   Portions of this note were generated with Lobbyist. Dictation errors may occur despite best attempts at proofreading.  Final Clinical  Impressions(s) / ED Diagnoses   Final diagnoses:  Throat irritation    ED Discharge Orders    None       Desma Mcgregor 11/28/18 1456    Hayden Rasmussen, MD 11/28/18 1657

## 2018-11-28 NOTE — Discharge Instructions (Signed)
You can take Tylenol or Ibuprofen as directed for pain. You can alternate Tylenol and Ibuprofen every 4 hours. If you take Tylenol at 1pm, then you can take Ibuprofen at 5pm. Then you can take Tylenol again at 9pm.   As we discussed, make sure you drink fluids and stay hydrated.  Return emergency department for any difficulty breathing, fever, cough or any other worsening or concerning symptoms.

## 2018-11-28 NOTE — ED Notes (Signed)
Discharge paperwork reviewed with pt.  Pt ambulatory, with use of personal walker, at discharge, NAD.

## 2018-11-28 NOTE — ED Notes (Signed)
Pt provided water with medications, able to take multiple sips between each med administration.  Tolerating PO intake well.

## 2018-11-28 NOTE — ED Triage Notes (Signed)
Patient reports her iron pill went down her trachea and got lodged and that she was able to use her inhaler and cough it up out in the lobby but is concerned about her lungs.  Pt is alert and oriented

## 2020-09-29 IMAGING — MG DIGITAL SCREENING BILATERAL MAMMOGRAM WITH TOMO AND CAD
8 of 14 series · 8 of 40 positions shown · non-contrast
Comparison: Previous exam(s).

CLINICAL DATA: Screening.

EXAM:
DIGITAL SCREENING BILATERAL MAMMOGRAM WITH TOMO AND CAD

[R MLO synth-2D (1 of 2)]
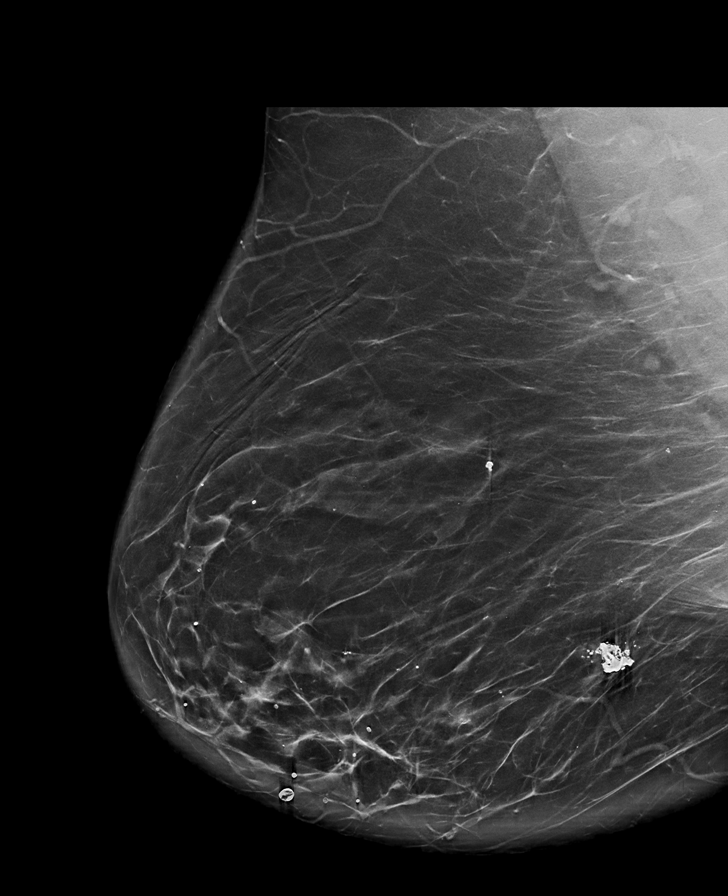

[L MLO synth-2D (1 of 2)]
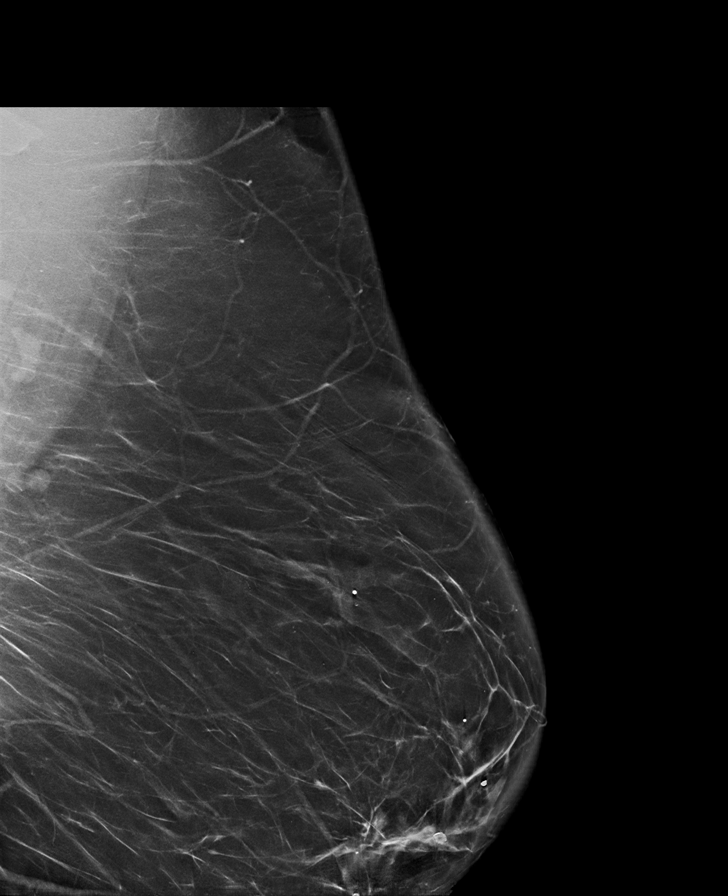

[L CC synth-2D]
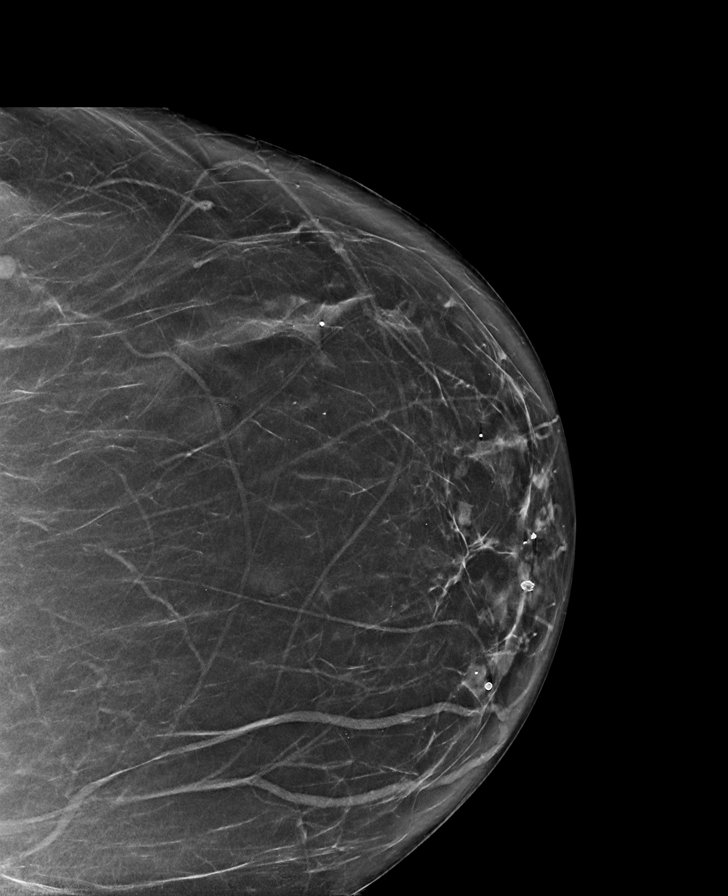

[L MLO synth-2D (2 of 2)]
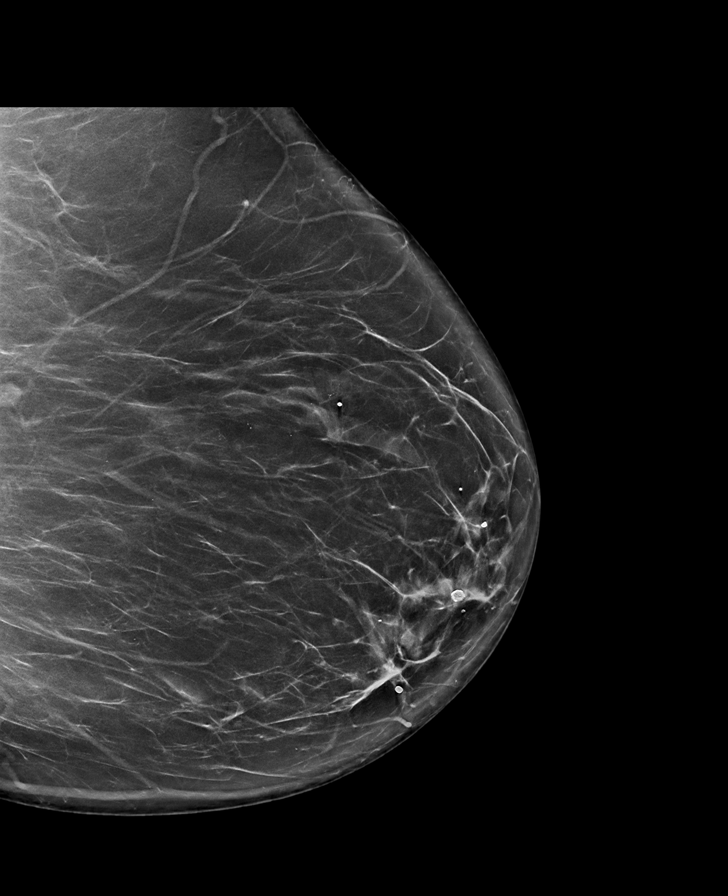

[L CV synth-2D]
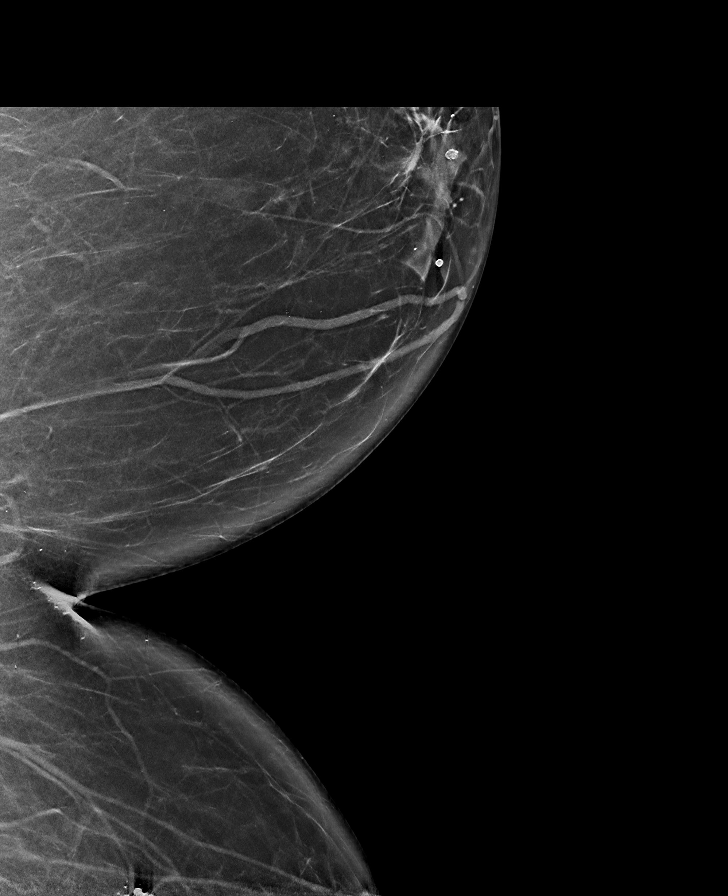

[R MLO synth-2D (2 of 2)]
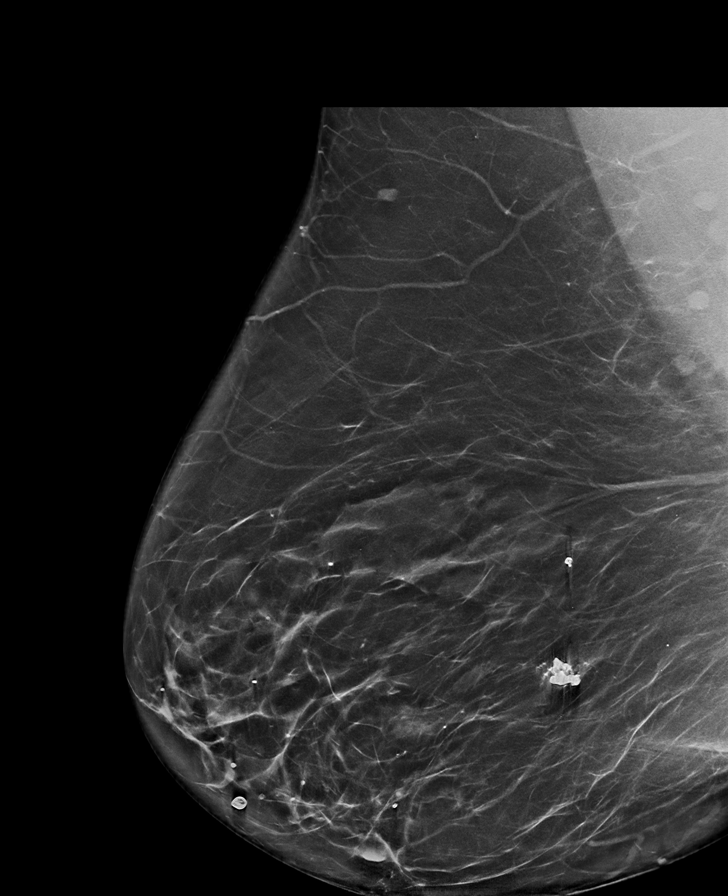

[R CC synth-2D]
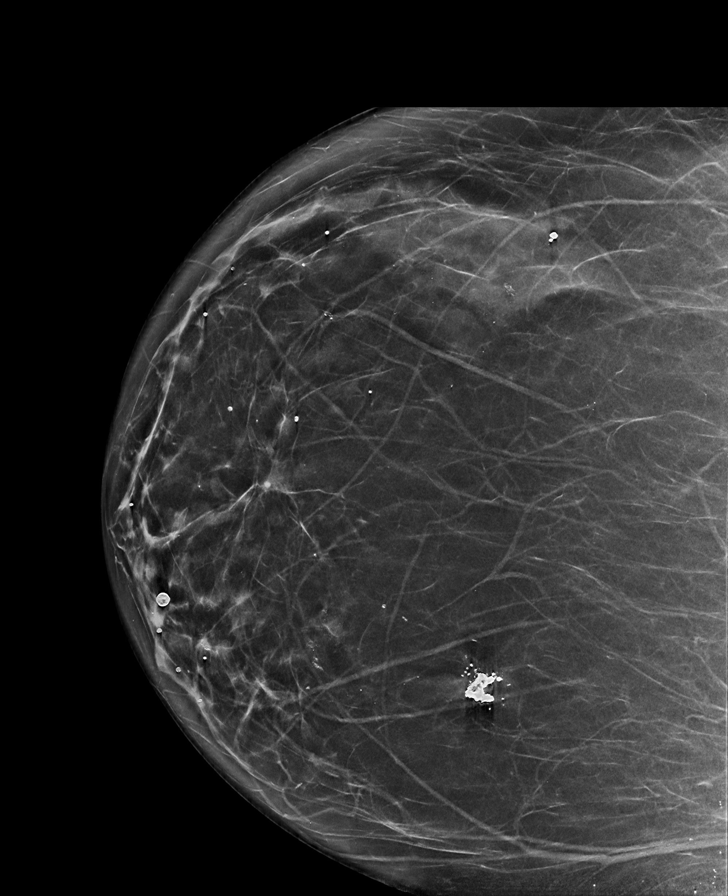

[L MLO tomo · tomo slice 50/99.0]
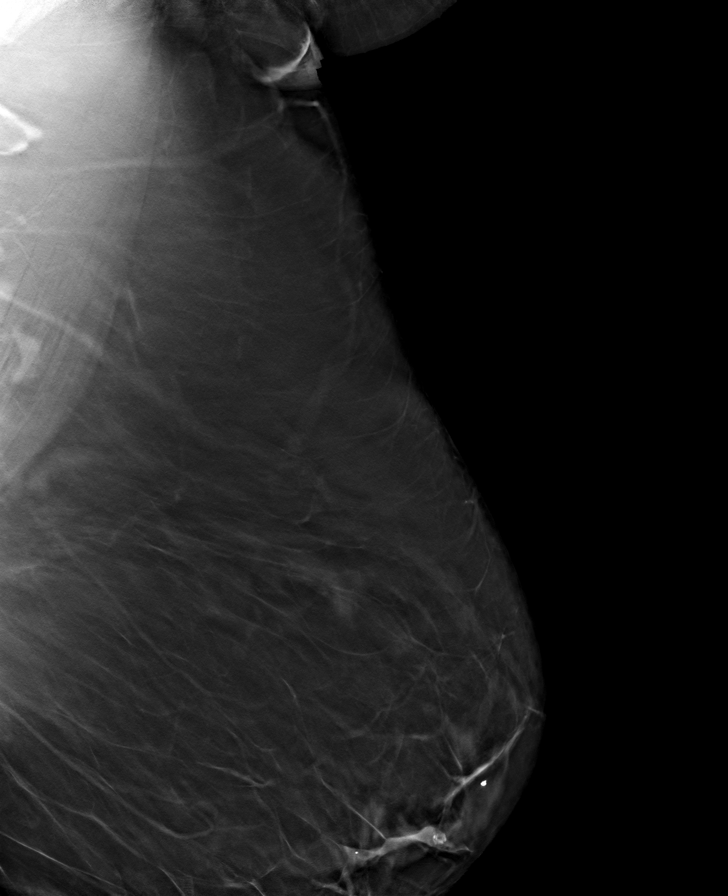

[8 of 40 positions shown; findings below may reference images not displayed]

ACR Breast Density Category b: There are scattered areas of
fibroglandular density.
FINDINGS: There are no findings suspicious for malignancy. Images were
processed with CAD.
IMPRESSION: No mammographic evidence of malignancy. A result letter of this
screening mammogram will be mailed directly to the patient.

RECOMMENDATION:
Screening mammogram in one year. (Code:[id number]-775)

BI-RADS CATEGORY  1: Negative.

## 2021-03-24 ENCOUNTER — Other Ambulatory Visit: Payer: Self-pay | Admitting: Family Medicine

## 2021-03-24 DIAGNOSIS — Z1231 Encounter for screening mammogram for malignant neoplasm of breast: Secondary | ICD-10-CM

## 2021-03-24 DIAGNOSIS — E2839 Other primary ovarian failure: Secondary | ICD-10-CM

## 2021-08-20 ENCOUNTER — Inpatient Hospital Stay: Admission: RE | Admit: 2021-08-20 | Payer: Medicare Other | Source: Ambulatory Visit

## 2021-08-20 ENCOUNTER — Ambulatory Visit: Payer: Medicare Other

## 2022-05-30 ENCOUNTER — Encounter (HOSPITAL_BASED_OUTPATIENT_CLINIC_OR_DEPARTMENT_OTHER): Payer: Self-pay

## 2022-05-30 ENCOUNTER — Emergency Department (HOSPITAL_BASED_OUTPATIENT_CLINIC_OR_DEPARTMENT_OTHER): Payer: Medicare (Managed Care) | Admitting: Radiology

## 2022-05-30 ENCOUNTER — Encounter (HOSPITAL_COMMUNITY): Payer: Self-pay

## 2022-05-30 ENCOUNTER — Inpatient Hospital Stay (HOSPITAL_BASED_OUTPATIENT_CLINIC_OR_DEPARTMENT_OTHER)
Admission: EM | Admit: 2022-05-30 | Discharge: 2022-06-09 | DRG: 493 | Disposition: A | Discharge status: Skilled Nursing Facility | Payer: Medicare (Managed Care) | Attending: Internal Medicine | Admitting: Internal Medicine

## 2022-05-30 ENCOUNTER — Other Ambulatory Visit: Payer: Self-pay

## 2022-05-30 ENCOUNTER — Emergency Department (HOSPITAL_BASED_OUTPATIENT_CLINIC_OR_DEPARTMENT_OTHER): Payer: Medicare (Managed Care)

## 2022-05-30 DIAGNOSIS — Z7982 Long term (current) use of aspirin: Secondary | ICD-10-CM

## 2022-05-30 DIAGNOSIS — S82201A Unspecified fracture of shaft of right tibia, initial encounter for closed fracture: Secondary | ICD-10-CM | POA: Diagnosis present

## 2022-05-30 DIAGNOSIS — Z6841 Body Mass Index (BMI) 40.0 and over, adult: Secondary | ICD-10-CM

## 2022-05-30 DIAGNOSIS — Z825 Family history of asthma and other chronic lower respiratory diseases: Secondary | ICD-10-CM

## 2022-05-30 DIAGNOSIS — Z7951 Long term (current) use of inhaled steroids: Secondary | ICD-10-CM

## 2022-05-30 DIAGNOSIS — W010XXA Fall on same level from slipping, tripping and stumbling without subsequent striking against object, initial encounter: Secondary | ICD-10-CM | POA: Diagnosis present

## 2022-05-30 DIAGNOSIS — J449 Chronic obstructive pulmonary disease, unspecified: Secondary | ICD-10-CM | POA: Diagnosis present

## 2022-05-30 DIAGNOSIS — M80861A Other osteoporosis with current pathological fracture, right lower leg, initial encounter for fracture: Secondary | ICD-10-CM | POA: Diagnosis not present

## 2022-05-30 DIAGNOSIS — N179 Acute kidney failure, unspecified: Secondary | ICD-10-CM | POA: Diagnosis not present

## 2022-05-30 DIAGNOSIS — Z66 Do not resuscitate: Secondary | ICD-10-CM | POA: Diagnosis present

## 2022-05-30 DIAGNOSIS — S82101A Unspecified fracture of upper end of right tibia, initial encounter for closed fracture: Secondary | ICD-10-CM | POA: Diagnosis not present

## 2022-05-30 DIAGNOSIS — D72829 Elevated white blood cell count, unspecified: Secondary | ICD-10-CM | POA: Diagnosis present

## 2022-05-30 DIAGNOSIS — H9190 Unspecified hearing loss, unspecified ear: Secondary | ICD-10-CM | POA: Diagnosis present

## 2022-05-30 DIAGNOSIS — G4730 Sleep apnea, unspecified: Secondary | ICD-10-CM | POA: Diagnosis present

## 2022-05-30 DIAGNOSIS — M978XXA Periprosthetic fracture around other internal prosthetic joint, initial encounter: Secondary | ICD-10-CM

## 2022-05-30 DIAGNOSIS — K219 Gastro-esophageal reflux disease without esophagitis: Secondary | ICD-10-CM | POA: Diagnosis present

## 2022-05-30 DIAGNOSIS — Z79899 Other long term (current) drug therapy: Secondary | ICD-10-CM

## 2022-05-30 DIAGNOSIS — F419 Anxiety disorder, unspecified: Secondary | ICD-10-CM | POA: Diagnosis present

## 2022-05-30 DIAGNOSIS — E785 Hyperlipidemia, unspecified: Secondary | ICD-10-CM | POA: Diagnosis present

## 2022-05-30 DIAGNOSIS — Y92009 Unspecified place in unspecified non-institutional (private) residence as the place of occurrence of the external cause: Secondary | ICD-10-CM

## 2022-05-30 DIAGNOSIS — Z751 Person awaiting admission to adequate facility elsewhere: Secondary | ICD-10-CM

## 2022-05-30 DIAGNOSIS — R296 Repeated falls: Secondary | ICD-10-CM | POA: Diagnosis present

## 2022-05-30 DIAGNOSIS — Z806 Family history of leukemia: Secondary | ICD-10-CM

## 2022-05-30 DIAGNOSIS — Z96649 Presence of unspecified artificial hip joint: Secondary | ICD-10-CM

## 2022-05-30 DIAGNOSIS — E1142 Type 2 diabetes mellitus with diabetic polyneuropathy: Secondary | ICD-10-CM | POA: Diagnosis present

## 2022-05-30 DIAGNOSIS — E039 Hypothyroidism, unspecified: Secondary | ICD-10-CM | POA: Diagnosis present

## 2022-05-30 DIAGNOSIS — Z87891 Personal history of nicotine dependence: Secondary | ICD-10-CM

## 2022-05-30 DIAGNOSIS — I10 Essential (primary) hypertension: Secondary | ICD-10-CM | POA: Diagnosis present

## 2022-05-30 DIAGNOSIS — Z888 Allergy status to other drugs, medicaments and biological substances status: Secondary | ICD-10-CM

## 2022-05-30 DIAGNOSIS — Z8249 Family history of ischemic heart disease and other diseases of the circulatory system: Secondary | ICD-10-CM

## 2022-05-30 DIAGNOSIS — S72301K Unspecified fracture of shaft of right femur, subsequent encounter for closed fracture with nonunion: Secondary | ICD-10-CM

## 2022-05-30 DIAGNOSIS — X58XXXD Exposure to other specified factors, subsequent encounter: Secondary | ICD-10-CM | POA: Diagnosis present

## 2022-05-30 DIAGNOSIS — D62 Acute posthemorrhagic anemia: Secondary | ICD-10-CM | POA: Diagnosis present

## 2022-05-30 DIAGNOSIS — F319 Bipolar disorder, unspecified: Secondary | ICD-10-CM | POA: Diagnosis present

## 2022-05-30 DIAGNOSIS — Z833 Family history of diabetes mellitus: Secondary | ICD-10-CM

## 2022-05-30 DIAGNOSIS — K5900 Constipation, unspecified: Secondary | ICD-10-CM | POA: Diagnosis not present

## 2022-05-30 DIAGNOSIS — W19XXXA Unspecified fall, initial encounter: Principal | ICD-10-CM

## 2022-05-30 LAB — CBC WITH DIFFERENTIAL/PLATELET
Abs Immature Granulocytes: 0.06 10*3/uL (ref 0.00–0.07)
Basophils Absolute: 0 10*3/uL (ref 0.0–0.1)
Basophils Relative: 0 %
Eosinophils Absolute: 0 10*3/uL (ref 0.0–0.5)
Eosinophils Relative: 0 %
HCT: 40.5 % (ref 36.0–46.0)
Hemoglobin: 12.8 g/dL (ref 12.0–15.0)
Immature Granulocytes: 0 %
Lymphocytes Relative: 6 %
Lymphs Abs: 0.9 10*3/uL (ref 0.7–4.0)
MCH: 29.2 pg (ref 26.0–34.0)
MCHC: 31.6 g/dL (ref 30.0–36.0)
MCV: 92.5 fL (ref 80.0–100.0)
Monocytes Absolute: 0.7 10*3/uL (ref 0.1–1.0)
Monocytes Relative: 5 %
Neutro Abs: 12.3 10*3/uL — ABNORMAL HIGH (ref 1.7–7.7)
Neutrophils Relative %: 89 %
Platelets: 357 10*3/uL (ref 150–400)
RBC: 4.38 MIL/uL (ref 3.87–5.11)
RDW: 15 % (ref 11.5–15.5)
WBC: 14 10*3/uL — ABNORMAL HIGH (ref 4.0–10.5)
nRBC: 0 % (ref 0.0–0.2)

## 2022-05-30 LAB — COMPREHENSIVE METABOLIC PANEL
ALT: 12 U/L (ref 0–44)
AST: 15 U/L (ref 15–41)
Albumin: 4.2 g/dL (ref 3.5–5.0)
Alkaline Phosphatase: 70 U/L (ref 38–126)
Anion gap: 11 (ref 5–15)
BUN: 15 mg/dL (ref 8–23)
CO2: 27 mmol/L (ref 22–32)
Calcium: 10.5 mg/dL — ABNORMAL HIGH (ref 8.9–10.3)
Chloride: 103 mmol/L (ref 98–111)
Creatinine, Ser: 0.83 mg/dL (ref 0.44–1.00)
GFR, Estimated: >60 mL/min (ref >60)
Glucose, Bld: 116 mg/dL — ABNORMAL HIGH (ref 70–99)
Potassium: 3.7 mmol/L (ref 3.5–5.1)
Sodium: 141 mmol/L (ref 135–145)
Total Bilirubin: 0.6 mg/dL (ref 0.3–1.2)
Total Protein: 7.8 g/dL (ref 6.5–8.1)

## 2022-05-30 MED ORDER — FENTANYL CITRATE PF 50 MCG/ML IJ SOSY
50.0000 ug | PREFILLED_SYRINGE | Freq: Once | INTRAMUSCULAR | Status: AC
  Administered 2022-05-30: 50 ug via INTRAVENOUS
  Filled 2022-05-30: qty 1

## 2022-05-30 NOTE — ED Notes (Signed)
Knee immobilizer does not fit pts leg, unable to apply correctly.

## 2022-05-30 NOTE — Progress Notes (Signed)
Plan of Care Note for accepted transfer   Patient: [name] [name] MRN: [id number]   DOA: 05/30/2022  Facility requesting transfer: [facility]   Requesting Provider: Dr. Oswald Hillock   Reason for transfer: Multiple acute RLE fractures   Facility course: 72 yr old lady with HTN, DM, hypothyroidism, bipolar, and BMI 57 who presents with right leg pain after a ground level mechanical fall. She is found to have fractures involving proximal tibia and fibular neck, and suspected periprosthetic right femur fracture. Dr. Rolena Infante of orthopedic surgery was consulted by the ED physician and recommended medical admission to [facility] for Dr. Marcelino Scot to evaluate.   Plan of care: The patient is accepted for admission to [place]  unit, at [facility].   Author: Vianne Bulls, MD 05/30/2022  Check [email or web address] for on-call coverage.  Nursing staff, Please call [place] number on Amion as soon as patient's arrival, so appropriate admitting provider can evaluate the pt.

## 2022-05-30 NOTE — ED Provider Notes (Signed)
[facility] Provider Note   CSN: [id number] Arrival date & time: 05/30/22  1922     History Chief Complaint  Patient presents with   Knee Injury    HPI [name] [name] is a 72 y.o. female presenting for fall at home.  Fell on her right leg.  History of multiple fractures in the right leg at baseline.  States that she fell out of her wheelchair.  Denies any other injuries.   Patient's recorded medical, surgical, social, medication list and allergies were reviewed in the Snapshot window as part of the initial history.   Review of Systems   Review of Systems  Constitutional:  Negative for chills and fever.  HENT:  Negative for ear pain and sore throat.   Eyes:  Negative for pain and visual disturbance.  Respiratory:  Negative for cough and shortness of breath.   Cardiovascular:  Negative for chest pain and palpitations.  Gastrointestinal:  Negative for abdominal pain and vomiting.  Genitourinary:  Negative for dysuria and hematuria.  Musculoskeletal:  Negative for arthralgias and back pain.  Skin:  Negative for color change and rash.  Neurological:  Negative for seizures and syncope.  All other systems reviewed and are negative.   Physical Exam Updated Vital Signs BP (!) 146/135 (BP Location: Right Arm)   Pulse 98   Temp 97.9 F (36.6 C)   Resp 18   Ht '5\' 1"'$  (1.549 m)   Wt (!) 136.1 kg   SpO2 99%   BMI 56.69 kg/m  Physical Exam Vitals and nursing note reviewed.  Constitutional:      General: She is not in acute distress.    Appearance: She is well-developed.  HENT:     Head: Normocephalic and atraumatic.  Eyes:     Conjunctiva/sclera: Conjunctivae normal.  Cardiovascular:     Rate and Rhythm: Normal rate and regular rhythm.     Heart sounds: No murmur heard. Pulmonary:     Effort: Pulmonary effort is normal. No respiratory distress.     Breath sounds: Normal breath sounds.  Abdominal:     General: There is no distension.      Palpations: Abdomen is soft.     Tenderness: There is no abdominal tenderness. There is no right CVA tenderness or left CVA tenderness.  Musculoskeletal:        General: Deformity and signs of injury present. No swelling or tenderness. Normal range of motion.     Cervical back: Neck supple.  Skin:    General: Skin is warm and dry.  Neurological:     General: No focal deficit present.     Mental Status: She is alert and oriented to person, place, and time. Mental status is at baseline.     Cranial Nerves: No cranial nerve deficit.      ED Course/ Medical Decision Making/ A&P Clinical Course as of 05/30/22 2318  Nancy Fetter May 30, 2022  2308 Consulted with Dr. Rolena Infante of orthopedic surgery.  He recommended placing patient in knee immobilizer for the multiple fractures, admitting to medicine at main campus and planning for consultation with Dr. Marcelino Scot in the a.m. [CC]    Clinical Course User Index [CC] Tretha Sciara, MD    Procedures Procedures   Medications Ordered in ED Medications - No data to display Medical Decision Making:    [name] [name] is a 72 y.o. female who presented to the ED today with a moderate mechanisma trauma, detailed above.    Patient's  presentation is complicated by their history of multiple comorbid medical problems.  Patient placed on continuous vitals and telemetry monitoring while in ED which was reviewed periodically.   Given this mechanism of trauma, a full physical exam was performed. Notably, patient was HDS.   Reviewed and confirmed nursing documentation for past medical history, family history, social history.    Initial Assessment/Plan:   This is a patient presenting with a moderate mechanism trauma.  As such, I have considered intracranial injuries including intracranial hemorrhage, intrathoracic injuries including blunt myocardial or blunt lung injury, blunt abdominal injuries including aortic dissection, bladder injury, spleen injury, liver injury  and I have considered orthopedic injuries including extremity or spinal injury.  With the patient's presentation of moderate mechanism trauma but an otherwise reassuring exam, patient warrants targeted evaluation for potential traumatic injuries. Will proceed with targeted evaluation for potential injuries. Will proceed with XR RLE and CXR. Objective evaluation resulted with multifocal fractures of the right lower extremity.   Final Reassessment and Plan:   Consulted Dr. Rolena Infante of orthopedic surgery.  He had recommended admission for management.  He recommended medical admission given patient's multiple comorbidities and plan to consult Dr. Marcelino Scot of Ortho trauma in the a.m. for definitive care and management.  He recommended knee immobilizer for now.   Disposition:   Based on the above findings, I believe this patient is stable for admission.    Patient/family educated about specific findings on our evaluation and explained exact reasons for admission.  Patient/family educated about clinical situation and time was allowed to answer questions.   Admission team communicated with and agreed with need for admission. Patient admitted. Patient ready to move at this time.     Emergency Department Medication Summary:   Medications  fentaNYL (SUBLIMAZE) injection 50 mcg (50 mcg Intravenous Given 05/30/22 2143)          Clinical Impression: No diagnosis found.   Data Unavailable   Final Clinical Impression(s) / ED Diagnoses Final diagnoses:  None    Rx / DC Orders ED Discharge Orders     None         Tretha Sciara, MD 05/30/22 2324

## 2022-05-30 NOTE — ED Triage Notes (Addendum)
Patient BIB [facility] from Home.  Endorses Falling today at approximately 1400. Tripped over uneven Floor in her home causing her to land on her Right Knee.  Head Impact against Floor. No LOC. No Anticoagulants.   NAD Noted during Triage. A&Ox4. CGS 15. BIB Stretcher and transferred to Wheelchair.

## 2022-05-31 ENCOUNTER — Emergency Department (HOSPITAL_BASED_OUTPATIENT_CLINIC_OR_DEPARTMENT_OTHER): Payer: Medicare (Managed Care)

## 2022-05-31 ENCOUNTER — Encounter (HOSPITAL_BASED_OUTPATIENT_CLINIC_OR_DEPARTMENT_OTHER): Payer: Self-pay | Admitting: Family Medicine

## 2022-05-31 DIAGNOSIS — Z96649 Presence of unspecified artificial hip joint: Secondary | ICD-10-CM | POA: Diagnosis not present

## 2022-05-31 DIAGNOSIS — Z6841 Body Mass Index (BMI) 40.0 and over, adult: Secondary | ICD-10-CM | POA: Diagnosis not present

## 2022-05-31 DIAGNOSIS — S72301K Unspecified fracture of shaft of right femur, subsequent encounter for closed fracture with nonunion: Secondary | ICD-10-CM | POA: Diagnosis not present

## 2022-05-31 DIAGNOSIS — M80861A Other osteoporosis with current pathological fracture, right lower leg, initial encounter for fracture: Secondary | ICD-10-CM | POA: Diagnosis present

## 2022-05-31 DIAGNOSIS — D72829 Elevated white blood cell count, unspecified: Secondary | ICD-10-CM | POA: Diagnosis present

## 2022-05-31 DIAGNOSIS — H9190 Unspecified hearing loss, unspecified ear: Secondary | ICD-10-CM | POA: Diagnosis present

## 2022-05-31 DIAGNOSIS — M978XXA Periprosthetic fracture around other internal prosthetic joint, initial encounter: Secondary | ICD-10-CM | POA: Diagnosis not present

## 2022-05-31 DIAGNOSIS — Z833 Family history of diabetes mellitus: Secondary | ICD-10-CM | POA: Diagnosis not present

## 2022-05-31 DIAGNOSIS — R296 Repeated falls: Secondary | ICD-10-CM | POA: Diagnosis present

## 2022-05-31 DIAGNOSIS — J449 Chronic obstructive pulmonary disease, unspecified: Secondary | ICD-10-CM | POA: Diagnosis present

## 2022-05-31 DIAGNOSIS — Z8249 Family history of ischemic heart disease and other diseases of the circulatory system: Secondary | ICD-10-CM | POA: Diagnosis not present

## 2022-05-31 DIAGNOSIS — N179 Acute kidney failure, unspecified: Secondary | ICD-10-CM | POA: Diagnosis not present

## 2022-05-31 DIAGNOSIS — E785 Hyperlipidemia, unspecified: Secondary | ICD-10-CM | POA: Diagnosis present

## 2022-05-31 DIAGNOSIS — E039 Hypothyroidism, unspecified: Secondary | ICD-10-CM | POA: Diagnosis present

## 2022-05-31 DIAGNOSIS — D62 Acute posthemorrhagic anemia: Secondary | ICD-10-CM | POA: Diagnosis present

## 2022-05-31 DIAGNOSIS — F319 Bipolar disorder, unspecified: Secondary | ICD-10-CM | POA: Diagnosis present

## 2022-05-31 DIAGNOSIS — Y92009 Unspecified place in unspecified non-institutional (private) residence as the place of occurrence of the external cause: Secondary | ICD-10-CM | POA: Diagnosis not present

## 2022-05-31 DIAGNOSIS — E1142 Type 2 diabetes mellitus with diabetic polyneuropathy: Secondary | ICD-10-CM | POA: Diagnosis present

## 2022-05-31 DIAGNOSIS — S82101A Unspecified fracture of upper end of right tibia, initial encounter for closed fracture: Secondary | ICD-10-CM | POA: Diagnosis present

## 2022-05-31 DIAGNOSIS — Z888 Allergy status to other drugs, medicaments and biological substances status: Secondary | ICD-10-CM | POA: Diagnosis not present

## 2022-05-31 DIAGNOSIS — S82141A Displaced bicondylar fracture of right tibia, initial encounter for closed fracture: Secondary | ICD-10-CM | POA: Diagnosis not present

## 2022-05-31 DIAGNOSIS — I10 Essential (primary) hypertension: Secondary | ICD-10-CM | POA: Diagnosis present

## 2022-05-31 DIAGNOSIS — Z87891 Personal history of nicotine dependence: Secondary | ICD-10-CM | POA: Diagnosis not present

## 2022-05-31 DIAGNOSIS — X58XXXD Exposure to other specified factors, subsequent encounter: Secondary | ICD-10-CM | POA: Diagnosis present

## 2022-05-31 DIAGNOSIS — K5900 Constipation, unspecified: Secondary | ICD-10-CM | POA: Diagnosis not present

## 2022-05-31 DIAGNOSIS — G4730 Sleep apnea, unspecified: Secondary | ICD-10-CM | POA: Diagnosis present

## 2022-05-31 DIAGNOSIS — S82201A Unspecified fracture of shaft of right tibia, initial encounter for closed fracture: Secondary | ICD-10-CM | POA: Diagnosis present

## 2022-05-31 DIAGNOSIS — W010XXA Fall on same level from slipping, tripping and stumbling without subsequent striking against object, initial encounter: Secondary | ICD-10-CM | POA: Diagnosis present

## 2022-05-31 DIAGNOSIS — Z825 Family history of asthma and other chronic lower respiratory diseases: Secondary | ICD-10-CM | POA: Diagnosis not present

## 2022-05-31 DIAGNOSIS — Z66 Do not resuscitate: Secondary | ICD-10-CM | POA: Diagnosis present

## 2022-05-31 DIAGNOSIS — W19XXXA Unspecified fall, initial encounter: Secondary | ICD-10-CM | POA: Diagnosis not present

## 2022-05-31 MED ORDER — ACETAMINOPHEN 500 MG PO TABS
1000.0000 mg | ORAL_TABLET | ORAL | Status: DC
  Administered 2022-05-31 – 2022-06-06 (×21): 1000 mg via ORAL
  Filled 2022-05-31 (×23): qty 2

## 2022-05-31 MED ORDER — SENNOSIDES-DOCUSATE SODIUM 8.6-50 MG PO TABS
1.0000 | ORAL_TABLET | Freq: Every evening | ORAL | Status: DC | PRN
  Administered 2022-06-05: 1 via ORAL
  Filled 2022-05-31: qty 1

## 2022-05-31 MED ORDER — ASPIRIN 81 MG PO TBEC
81.0000 mg | DELAYED_RELEASE_TABLET | Freq: Every day | ORAL | Status: DC
  Administered 2022-05-31 – 2022-06-04 (×4): 81 mg via ORAL
  Filled 2022-05-31 (×4): qty 1

## 2022-05-31 MED ORDER — HYDRALAZINE HCL 25 MG PO TABS: 25.0000 mg | ORAL_TABLET | Freq: Four times a day (QID) | ORAL | Status: DC | PRN

## 2022-05-31 MED ORDER — MORPHINE SULFATE (PF) 4 MG/ML IV SOLN
4.0000 mg | INTRAVENOUS | Status: DC | PRN
  Administered 2022-05-31: 4 mg via INTRAVENOUS
  Filled 2022-05-31: qty 1

## 2022-05-31 MED ORDER — GABAPENTIN 400 MG PO CAPS
1200.0000 mg | ORAL_CAPSULE | Freq: Every day | ORAL | Status: DC
  Administered 2022-05-31 – 2022-06-08 (×9): 1200 mg via ORAL
  Filled 2022-05-31 (×10): qty 3

## 2022-05-31 MED ORDER — GABAPENTIN 300 MG PO CAPS
600.0000 mg | ORAL_CAPSULE | Freq: Every day | ORAL | Status: DC
  Administered 2022-05-31 – 2022-06-09 (×9): 600 mg via ORAL
  Filled 2022-05-31 (×9): qty 2

## 2022-05-31 MED ORDER — OXYCODONE HCL 5 MG PO TABS
5.0000 mg | ORAL_TABLET | ORAL | Status: DC | PRN
  Administered 2022-05-31 – 2022-06-07 (×10): 5 mg via ORAL
  Filled 2022-05-31 (×11): qty 1

## 2022-05-31 MED ORDER — ONDANSETRON HCL 4 MG/2ML IJ SOLN
4.0000 mg | Freq: Four times a day (QID) | INTRAMUSCULAR | Status: DC | PRN
  Administered 2022-05-31: 4 mg via INTRAVENOUS
  Filled 2022-05-31: qty 2

## 2022-05-31 MED ORDER — METFORMIN HCL 500 MG PO TABS
500.0000 mg | ORAL_TABLET | Freq: Two times a day (BID) | ORAL | Status: DC
  Administered 2022-05-31 – 2022-06-09 (×17): 500 mg via ORAL
  Filled 2022-05-31 (×17): qty 1

## 2022-05-31 MED ORDER — FERROUS SULFATE 325 (65 FE) MG PO TABS
650.0000 mg | ORAL_TABLET | Freq: Every day | ORAL | Status: DC
  Administered 2022-05-31 – 2022-06-06 (×6): 650 mg via ORAL
  Filled 2022-05-31 (×5): qty 2

## 2022-05-31 MED ORDER — BISACODYL 5 MG PO TBEC: 5.0000 mg | DELAYED_RELEASE_TABLET | Freq: Every day | ORAL | Status: DC | PRN

## 2022-05-31 MED ORDER — PANTOPRAZOLE SODIUM 40 MG PO TBEC
40.0000 mg | DELAYED_RELEASE_TABLET | Freq: Every day | ORAL | Status: DC
  Administered 2022-05-31 – 2022-06-09 (×9): 40 mg via ORAL
  Filled 2022-05-31 (×9): qty 1

## 2022-05-31 MED ORDER — DOCUSATE SODIUM 100 MG PO CAPS
100.0000 mg | ORAL_CAPSULE | Freq: Two times a day (BID) | ORAL | Status: DC
  Administered 2022-05-31 – 2022-06-02 (×5): 100 mg via ORAL
  Filled 2022-05-31: qty 2
  Filled 2022-05-31 (×5): qty 1

## 2022-05-31 MED ORDER — IPRATROPIUM-ALBUTEROL 20-100 MCG/ACT IN AERS: 2.0000 | INHALATION_SPRAY | Freq: Two times a day (BID) | RESPIRATORY_TRACT | Status: DC | PRN

## 2022-05-31 MED ORDER — HYDROMORPHONE HCL 1 MG/ML IJ SOLN: 0.5000 mg | INTRAMUSCULAR | Status: DC | PRN

## 2022-05-31 MED ORDER — LISINOPRIL 20 MG PO TABS
20.0000 mg | ORAL_TABLET | Freq: Every day | ORAL | Status: DC
  Administered 2022-05-31: 20 mg via ORAL
  Filled 2022-05-31: qty 2

## 2022-05-31 MED ORDER — ACETAMINOPHEN 325 MG PO TABS: 1300.0000 mg | ORAL_TABLET | Freq: Three times a day (TID) | ORAL | Status: DC | PRN

## 2022-05-31 MED ORDER — ENOXAPARIN SODIUM 80 MG/0.8ML IJ SOSY
0.5000 mg/kg | PREFILLED_SYRINGE | INTRAMUSCULAR | Status: DC
  Administered 2022-05-31 – 2022-06-01 (×2): 67.5 mg via SUBCUTANEOUS
  Filled 2022-05-31 (×2): qty 0.8

## 2022-05-31 MED ORDER — METFORMIN HCL 500 MG PO TABS: 500.0000 mg | ORAL_TABLET | Freq: Two times a day (BID) | ORAL | Status: DC

## 2022-05-31 MED ORDER — NYSTATIN 100000 UNIT/GM EX POWD
Freq: Once | CUTANEOUS | Status: AC
  Filled 2022-05-31: qty 15

## 2022-05-31 MED ORDER — POTASSIUM CHLORIDE CRYS ER 20 MEQ PO TBCR
40.0000 meq | EXTENDED_RELEASE_TABLET | Freq: Once | ORAL | Status: AC
  Administered 2022-05-31: 40 meq via ORAL
  Filled 2022-05-31: qty 2

## 2022-05-31 NOTE — H&P (Signed)
History and Physical    [name] [name] CMK:[id number] DOB: [birth year] DOA: 05/30/2022  PCP: Aletha Halim., PA-C (Confirm with patient/family/NH records and if not entered, this has to be entered at [facility] point of entry) Patient coming from: Home  I have personally briefly reviewed patient's old medical records in [place]  Chief Complaint: I fell on my left leg  HPI: [name] [name] is a 72 y.o. female with medical history significant of frequent falls, with chronic nonpitting right femur fracture since 2017, chronic ambulation dysfunction, would be obesity, COPD, HTN, HLD, IIDM, diabetic neuropathy, sustained a mechanical fall and hit her right knee at home.  Patient stuck her right foot in a hole at home while walking, and fell on her right knee.  Immediate severe/10/10 pain of right knee and unable to get up.  Denies any syncope, no head injury no LOC.  She has a nonhealing right femur fracture chronically.  ED Course: Afebrile, vital signs stable.  CT and x-ray showed nondisplaced large transverse fracture metaphyseal region of the proximal tibia,, transverse fracture of femoral neck and chronic nonhealing meet-upper shaft femoral fracture with significant loosening of the longstem intramedullary nail.    Review of Systems: As per HPI otherwise 14 point review of systems negative.    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   Peripheral neuropathy    Pneumonia    hx of    Shortness of breath    with exertion    Skin yeast infection    history of under bilateral breast,, pannus, inner legs   Sleep apnea     uses bipap- does not know setting s    Past Surgical History:  Procedure Laterality Date   COLONOSCOPY  10/22/2011   Procedure: COLONOSCOPY;  Surgeon: Beryle Beams, MD;  Location: [facility];  Service: Endoscopy;  Laterality: N/A;   COLONOSCOPY WITH PROPOFOL N/A 01/21/2017   Procedure: COLONOSCOPY WITH PROPOFOL;  Surgeon: Carol Ada, MD;  Location: [facility];  Service: Endoscopy;  Laterality: N/A;   ESOPHAGOGASTRODUODENOSCOPY  10/22/2011   Procedure: ESOPHAGOGASTRODUODENOSCOPY (EGD);  Surgeon: Beryle Beams, MD;  Location: [facility];  Service: Endoscopy;  Laterality: N/A;   femur fx     FEMUR IM NAIL Right 01/08/2014   Procedure: INTRAMEDULLARY (IM) NAIL RIGHT HIP WITH INFUSED ALLOGRAFTING;  Surgeon: Rozanna Box, MD;  Location: [facility];  Service: Orthopedics;  Laterality: Right;   HARDWARE REMOVAL  03/27/2012   Procedure: HARDWARE REMOVAL;  Surgeon: Mauri Pole, MD;  Location: [facility];  Service: Orthopedics;  Laterality: Right;   HARDWARE REMOVAL Right 01/08/2014   Procedure: HARDWARE REMOVAL RIGHT PROXIMAL FEMUR;  Surgeon: Rozanna Box, MD;  Location: [facility];  Service: Orthopedics;  Laterality: Right;   HIP FRACTURE SURGERY     INTRAMEDULLARY (IM) NAIL INTERTROCHANTERIC  03/27/2012   Procedure: INTRAMEDULLARY (IM) NAIL INTERTROCHANTRIC;  Surgeon: Mauri Pole, MD;  Location: [facility];  Service: Orthopedics;  Laterality: Right;  Possible Exchange with a Shorter Trochantric Nail  TONSILLECTOMY       reports that she quit smoking about 14 years ago. She has never used smokeless tobacco. She reports that she does not drink alcohol and does not use drugs.  Allergies  Allergen Reactions   Coffee Flavor Nausea And Vomiting    Per pt she is "allergic" to the smell of coffee   Prednisone Other (See Comments)    Tightness in chest   Other Other (See Comments)    Plain nystatin ointment-makes skin get worse Patient is allergic to any petroleum based ointment     Family  History  Problem Relation Age of Onset   Arrhythmia Mother    Heart failure Mother    Diabetes Mellitus II Mother    Leukemia Father    Heart attack Father    Heart disease Father    Heart disease Sister    COPD Sister    Heart disease Brother    Other Sister        Factor 5   Leukemia Sister    Other Brother        "instant death"    Prior to Admission medications   Medication Sig Start Date End Date Taking? Authorizing Provider  Carboxymethylcellul-Glycerin (REFRESH OPTIVE OP) Apply 4-5 drops to eye daily as needed (dry eyes).    Yes [provider]  omeprazole (PRILOSEC) 20 MG capsule Take 20 mg by mouth at bedtime.    Yes [provider]  acetaminophen (TYLENOL) 650 MG CR tablet Take 1,950 mg by mouth 2 (two) times daily.    [provider]  aspirin EC 81 MG tablet Take 81 mg by mouth daily.    [provider]  Calcium Carbonate-Vitamin D (CALTRATE 600+D PO) Take 2 tablets by mouth every evening.     [provider]  cholecalciferol (VITAMIN D) 1000 UNITS tablet Take 2,000 Units by mouth daily.     [provider]  clotrimazole-betamethasone (LOTRISONE) cream Apply 1 application topically daily as needed (sores).    [provider]  docusate sodium 100 MG CAPS Take 100 mg by mouth 2 (two) times daily. 03/30/12   Danae Orleans, PA-C  ferrous sulfate 325 (65 FE) MG tablet Take 650 mg by mouth daily.     [provider]  gabapentin (NEURONTIN) 600 MG tablet Take 600-1,200 mg by mouth 2 (two) times daily. Take 600 mg by mouth in the morning and take 1200 mg by mouth in the evening.    [provider]  HYDROcodone-acetaminophen (NORCO/VICODIN) 5-325 MG tablet Take 1 tablet by mouth daily as needed for moderate pain. (pain)    [provider]  Ipratropium-Albuterol (COMBIVENT RESPIMAT) 20-100 MCG/ACT AERS respimat Inhale 2 puffs into the lungs 2 (two) times daily as needed for wheezing.      [provider]  lisinopril (PRINIVIL,ZESTRIL) 20 MG tablet Take 20 mg by mouth daily.     [provider]  metFORMIN (GLUCOPHAGE) 500 MG tablet Take 500 mg by mouth 2 (two) times daily.     [provider]  Multiple Vitamins-Minerals (CENTRUM SILVER ULTRA WOMENS) TABS Take 1 tablet by mouth 2 (two) times daily.     [provider]  Multiple Vitamins-Minerals ([place]) CAPS Take 1 capsule by mouth daily.     [provider]  neomycin-polymyxin-pramoxine (NEOSPORIN PLUS) 1 % cream Apply 1 application topically as needed (wound care).    [provider]  Omega-3 Fatty Acids (OMEGA 3 500 PO) Take  500 mg by mouth 2 (two) times daily.    [provider]    Physical Exam: Vitals:   05/31/22 1000 05/31/22 1030 05/31/22 1100 05/31/22 1155  BP: 109/60 1'12/65 96/60 97/63 '$  Pulse: 80 83 81 83  Resp:    18  Temp:    97.9 F (36.6 C)  TempSrc:    Oral  SpO2: 99% 99% 98% 98%  Weight:      Height:        Constitutional: NAD, calm, comfortable Vitals:   05/31/22 1000 05/31/22 1030 05/31/22 1100 05/31/22 1155  BP: 109/60 1'12/65 96/60 97/63 '$  Pulse: 80 83 81 83  Resp:    18  Temp:    97.9 F (36.6 C)  TempSrc:    Oral  SpO2: 99% 99% 98% 98%  Weight:      Height:       Eyes: PERRL, lids and conjunctivae normal ENMT: Mucous membranes are moist. Posterior pharynx clear of any exudate or lesions.Normal dentition.  Neck: normal, supple, no masses, no thyromegaly Respiratory: clear to auscultation bilaterally, no wheezing, no crackles. Normal respiratory effort. No accessory muscle use.  Cardiovascular: Regular rate and rhythm, no murmurs / rubs / gallops. No extremity edema. 2+ pedal pulses. No carotid bruits.  Abdomen: no tenderness, no masses palpated. No hepatosplenomegaly. Bowel sounds positive.  Musculoskeletal: Right knee and distal leg in immobilizer, moving all toes and sensation of right leg and right foot  remains intact Skin: no rashes, lesions, ulcers. No induration Neurologic: CN 2-12 grossly intact. Sensation intact, DTR normal. Strength 5/5 in all 4.  Psychiatric: Normal judgment and insight. Alert and oriented x 3. Normal mood.     Labs on Admission: I have personally reviewed following labs and imaging studies  CBC: Recent Labs  Lab 05/30/22 2144  WBC 14.0*  NEUTROABS 12.3*  HGB 12.8  HCT 40.5  MCV 92.5  PLT 413   Basic Metabolic Panel: Recent Labs  Lab 05/30/22 2144  NA 141  K 3.7  CL 103  CO2 27  GLUCOSE 116*  BUN 15  CREATININE 0.83  CALCIUM 10.5*   GFR: Estimated Creatinine Clearance: 79.2 mL/min (by C-G formula based on SCr of 0.83 mg/dL). Liver Function Tests: Recent Labs  Lab 05/30/22 2144  AST 15  ALT 12  ALKPHOS 70  BILITOT 0.6  PROT 7.8  ALBUMIN 4.2   No results for input(s): "LIPASE", "AMYLASE" in the last 168 hours. No results for input(s): "AMMONIA" in the last 168 hours. Coagulation Profile: No results for input(s): "INR", "PROTIME" in the last 168 hours. Cardiac Enzymes: No results for input(s): "CKTOTAL", "CKMB", "CKMBINDEX", "TROPONINI" in the last 168 hours. BNP (last 3 results) No results for input(s): "PROBNP" in the last 8760 hours. HbA1C: No results for input(s): "HGBA1C" in the last 72 hours. CBG: No results for input(s): "GLUCAP" in the last 168 hours. Lipid Profile: No results for input(s): "CHOL", "HDL", "LDLCALC", "TRIG", "CHOLHDL", "LDLDIRECT" in the last 72 hours. Thyroid Function Tests: No results for input(s): "TSH", "T4TOTAL", "FREET4", "T3FREE", "THYROIDAB" in the last 72 hours. Anemia Panel: No results for input(s): "VITAMINB12", "FOLATE", "FERRITIN", "TIBC", "IRON", "RETICCTPCT" in the last 72 hours. Urine analysis:    Component Value Date/Time   COLORURINE YELLOW 01/08/2014 1910   APPEARANCEUR CLEAR 01/08/2014 1910   LABSPEC 1.020 01/08/2014 1910   PHURINE 5.0 01/08/2014 1910   GLUCOSEU NEGATIVE 01/08/2014  1910   HGBUR NEGATIVE 01/08/2014 1910   BILIRUBINUR NEGATIVE 01/08/2014 1910   KETONESUR NEGATIVE  01/08/2014 1910   PROTEINUR NEGATIVE 01/08/2014 1910   UROBILINOGEN 0.2 01/08/2014 1910   NITRITE NEGATIVE 01/08/2014 1910   LEUKOCYTESUR NEGATIVE 01/08/2014 1910    Radiological Exams on Admission: CT KNEE RIGHT WO CONTRAST  Result Date: 05/31/2022 CLINICAL DATA:  [place].  Remote history of trauma. EXAM: CT OF THE LOWER RIGHT FEMUR WITHOUT CONTRAST, CT OF THE RIGHT KNEE WITHOUT CONTRAST TECHNIQUE: Multidetector CT imaging of the right lower extremity was performed according to the standard protocol. RADIATION DOSE REDUCTION: This exam was performed according to the departmental dose-optimization program which includes automated exposure control, adjustment of the mA and/or kV according to patient size and/or use of iterative reconstruction technique. COMPARISON:  Radiographs 05/30/2022 FINDINGS: The right hip is normally located. Moderate hip joint degenerative changes but no acute hip fracture is identified. There are 2 cannulated hip screws. The more superior screw is loose and has pulled back significantly. It is protruding from the femoral cortex approximately 3 cm. The long stem intramedullary gamma nail is loose with significant surrounding lucency. The mid to upper shaft femur fracture is ununited. Marked lucency surrounding the distal intramedullary gamma nail and the interlocking distal screws consistent with significant loosening. Moderate degenerative changes involving the knee joint but no intra-articular fracture is identified. There is a largely transverse fracture through the metadiaphyseal region of the proximal tibia without displacement. There is also a largely transverse fracture involving the fibular neck. IMPRESSION: 1. 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 No acute hip or femur fracture. 2. Significant loosening of the long stem intramedullary gamma nail, the most superior  dynamic hip screw and the distal interlocking screws. 3. Nondisplaced largely transverse fracture through the metadiaphyseal region of the proximal tibia without intra-articular involvement 4. Transverse fracture involving the fibular neck. Electronically Signed   By: Marijo Sanes M.D.   On: 05/31/2022 10:41   CT FEMUR RIGHT WO CONTRAST  Result Date: 05/31/2022 CLINICAL DATA:  [place].  Remote history of trauma. EXAM: CT OF THE LOWER RIGHT FEMUR WITHOUT CONTRAST, CT OF THE RIGHT KNEE WITHOUT CONTRAST TECHNIQUE: Multidetector CT imaging of the right lower extremity was performed according to the standard protocol. RADIATION DOSE REDUCTION: This exam was performed according to the departmental dose-optimization program which includes automated exposure control, adjustment of the mA and/or kV according to patient size and/or use of iterative reconstruction technique. COMPARISON:  Radiographs 05/30/2022 FINDINGS: The right hip is normally located. Moderate hip joint degenerative changes but no acute hip fracture is identified. There are 2 cannulated hip screws. The more superior screw is loose and has pulled back significantly. It is protruding from the femoral cortex approximately 3 cm. The long stem intramedullary gamma nail is loose with significant surrounding lucency. The mid to upper shaft femur fracture is ununited. Marked lucency surrounding the distal intramedullary gamma nail and the interlocking distal screws consistent with significant loosening. Moderate degenerative changes involving the knee joint but no intra-articular fracture is identified. There is a largely transverse fracture through the metadiaphyseal region of the proximal tibia without displacement. There is also a largely transverse fracture involving the fibular neck. IMPRESSION: 1. 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 No acute hip or femur fracture. 2. Significant loosening of the long stem intramedullary gamma nail, the most  superior dynamic hip screw and the distal interlocking screws. 3. Nondisplaced largely transverse fracture through the metadiaphyseal region of the proximal tibia without intra-articular involvement 4. Transverse fracture involving the fibular neck. Electronically Signed   By:  Marijo Sanes M.D.   On: 05/31/2022 10:41   DG Chest 1 View  Result Date: 05/30/2022 CLINICAL DATA:  Recent fall with chest pain, initial encounter EXAM: CHEST  1 VIEW COMPARISON:  08/07/2015 FINDINGS: The heart size and mediastinal contours are within normal limits. Both lungs are clear. The visualized skeletal structures are unremarkable. IMPRESSION: No active disease. Electronically Signed   By: Inez Catalina M.D.   On: 05/30/2022 22:52   DG Hip Unilat W or Wo Pelvis 2-3 Views Right  Result Date: 05/30/2022 CLINICAL DATA:  Recent trip and fall with right hip pain, initial encounter EXAM: DG HIP (WITH OR WITHOUT PELVIS) 2-3V RIGHT COMPARISON:  01/08/2014 FINDINGS: Postsurgical changes are noted in the right hip and right femur. Considerable callus formation is noted. There is however a lucency identified within the callus suspicious for undisplaced fracture. Pelvic ring is intact. IMPRESSION: Healed fracture in the right femur with callus formation. Postsurgical changes are seen. There is however a lucency identified within the callus suspicious for recurrent fracture. Correlation to point tenderness is recommended. Electronically Signed   By: Inez Catalina M.D.   On: 05/30/2022 22:52   DG Knee Complete 4 Views Right  Result Date: 05/30/2022 CLINICAL DATA:  Recent trip and fall with right knee pain, initial encounter EXAM: RIGHT KNEE - COMPLETE 4+ VIEW COMPARISON:  01/08/2014 FINDINGS: There is a mildly displaced angulated fracture in the proximal tibial metaphysis. Fibular neck fracture is noted as well. Changes of prior operative fixation in the right femur are seen. No gross soft tissue abnormality is noted. IMPRESSION: Proximal  tibial metaphyseal fracture as described. Associated fibular neck fracture is seen as well. Electronically Signed   By: Inez Catalina M.D.   On: 05/30/2022 22:49    EKG: None  Assessment/Plan Principal Problem:   Periprosthetic fracture of shaft of femur Active Problems:   Acute blood loss anemia   Morbid obesity with BMI of 45.0-49.9, adult (HCC)   COPD (chronic obstructive pulmonary disease) (HCC)   Closed right tibial fracture  (please populate well all problems here in Problem List. (For example, if patient is on BP meds at home and you resume or decide to hold them, it is a problem that needs to be her. Same for CAD, COPD, HLD and so on)  Right tibial metaphyseal and fibular neck fracture -Secondary to mechanical fall -Orthopedic surgery consultation appreciated, for now recommendation is conservative management with continuous right knee immobilizer and nonweightbearing. -PT evaluation starting tomorrow, likely will need SNF -Symptomatic management with pain control -Chemical DVT prophylaxis with Lovenox  Leukocytosis -Likely related to the right tibial and fibular fracture, no symptoms or signs of active infection.  IIDM -Glucose fairly controlled, continue metformin  Acute blood loss anemia -Hemodynamically stable, repeat H&H tomorrow  Nonhealing right femur fracture -Outpatient follow-up with orthopedic surgery  HTN -BP borderline low probably secondary to pain meds and acute blood loss, on as needed hydralazine for now hold off BP meds  Morbid obesity -Patient not interested in calorie control  DVT prophylaxis: Lovenox Code Status: Full code Family Communication: None at bedside Disposition Plan: Patient came with right tibial and fibular fracture, requiring inpatient orthopedic consult and PT evaluation, expect more than 2 midnight hospitals stay Consults called: Ortho Admission status: Medsurg admit   Lequita Halt MD [facility] Pager  [phone] 05/31/2022, 3:35 PM

## 2022-05-31 NOTE — ED Notes (Signed)
PT has redness throughout her perineum and abdominal folds secondary to suspected yeast infection. PT has been using topical medicine at home.

## 2022-05-31 NOTE — Consult Note (Signed)
Reason for Consult:Right tibia fx Referring Physician: Christia Reading Opyd Time called: [id number] Time at bedside: [name] [name] is an 72 y.o. female.  HPI: [name] [name] was at home when she stepped in a hole and fell. She had immediate right knee pain and could not get up. She was brought to [facility] where x-rays showed a proximal tib/fib fx. She was transferred to [facility] and orthopedic surgery was consulted. She lives in a small mobile home and uses a WC and RW but only ambulates to bathroom.  Past Medical History:  Diagnosis Date   Anemia    Anxiety    Arthritis    Bipolar disorder ([place])    Bronchitis    COPD (chronic obstructive pulmonary disease) ([place])    Depression    Diabetes mellitus    type II   Encounter for blood transfusion    GERD (gastroesophageal reflux disease)    Hearing loss    Hyperlipemia    Hypertension    Nonunion, 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   Peripheral neuropathy    Pneumonia    hx of    Shortness of breath    with exertion    Skin yeast infection    history of under bilateral breast,, pannus, inner legs   Sleep apnea    uses bipap- does not know setting s    Past Surgical History:  Procedure Laterality Date   COLONOSCOPY  10/22/2011   Procedure: COLONOSCOPY;  Surgeon: Beryle Beams, MD;  Location: [facility];  Service: Endoscopy;  Laterality: N/A;   COLONOSCOPY WITH PROPOFOL N/A 01/21/2017   Procedure: COLONOSCOPY WITH PROPOFOL;  Surgeon: Carol Ada, MD;  Location: [facility];  Service: Endoscopy;  Laterality: N/A;   ESOPHAGOGASTRODUODENOSCOPY  10/22/2011   Procedure: ESOPHAGOGASTRODUODENOSCOPY (EGD);  Surgeon: Beryle Beams, MD;  Location: [facility];  Service: Endoscopy;  Laterality: N/A;   femur fx     FEMUR IM NAIL Right 01/08/2014   Procedure: INTRAMEDULLARY (IM) NAIL RIGHT HIP WITH INFUSED ALLOGRAFTING;  Surgeon: Rozanna Box, MD;  Location: [facility];  Service: Orthopedics;  Laterality: Right;   HARDWARE REMOVAL  03/27/2012    Procedure: HARDWARE REMOVAL;  Surgeon: Mauri Pole, MD;  Location: [facility];  Service: Orthopedics;  Laterality: Right;   HARDWARE REMOVAL Right 01/08/2014   Procedure: HARDWARE REMOVAL RIGHT PROXIMAL FEMUR;  Surgeon: Rozanna Box, MD;  Location: [place] [facility];  Service: Orthopedics;  Laterality: Right;   HIP FRACTURE SURGERY     INTRAMEDULLARY (IM) NAIL INTERTROCHANTERIC  03/27/2012   Procedure: INTRAMEDULLARY (IM) NAIL INTERTROCHANTRIC;  Surgeon: Mauri Pole, MD;  Location: [facility];  Service: Orthopedics;  Laterality: Right;  Possible Exchange with a Shorter Trochantric Nail   TONSILLECTOMY      Family History  Problem Relation Age of Onset   Arrhythmia Mother    Heart failure Mother    Diabetes Mellitus II Mother    Leukemia Father    Heart attack Father    Heart disease Father    Heart disease Sister    COPD Sister    Heart disease Brother    Other Sister        Factor 5   Leukemia Sister    Other Brother        "instant death"    Social History:  reports that she quit smoking about 14 years ago. She has never used smokeless tobacco. She reports that she does not drink alcohol and does not use drugs.  Allergies:  Allergies  Allergen Reactions   Coffee Flavor Nausea And Vomiting    Per pt she is "allergic" to the smell of coffee   Prednisone Other (See Comments)    Tightness in chest   Other Other (See Comments)    Plain nystatin ointment-makes skin get worse Patient is allergic to any petroleum based ointment     Medications: I have reviewed the patient's current medications.  Results for orders placed or performed during the hospital encounter of 05/30/22 (from the past 48 hour(s))  CBC with Differential     Status: Abnormal   Collection Time: 05/30/22  9:44 PM  Result Value Ref Range   WBC 14.0 (H) 4.0 - 10.5 K/uL   RBC 4.38 3.87 - 5.11 MIL/uL   Hemoglobin 12.8 12.0 - 15.0 g/dL   HCT 40.5 36.0 - 46.0 %   MCV 92.5 80.0 - 100.0 fL   MCH 29.2 26.0 - 34.0 pg    MCHC 31.6 30.0 - 36.0 g/dL   RDW 15.0 11.5 - 15.5 %   Platelets 357 150 - 400 K/uL   nRBC 0.0 0.0 - 0.2 %   Neutrophils Relative % 89 %   Neutro Abs 12.3 (H) 1.7 - 7.7 K/uL   Lymphocytes Relative 6 %   Lymphs Abs 0.9 0.7 - 4.0 K/uL   Monocytes Relative 5 %   Monocytes Absolute 0.7 0.1 - 1.0 K/uL   Eosinophils Relative 0 %   Eosinophils Absolute 0.0 0.0 - 0.5 K/uL   Basophils Relative 0 %   Basophils Absolute 0.0 0.0 - 0.1 K/uL   Immature Granulocytes 0 %   Abs Immature Granulocytes 0.06 0.00 - 0.07 K/uL    Comment: Performed at [facility], [place], [place] [address]  Comprehensive metabolic panel     Status: Abnormal   Collection Time: 05/30/22  9:44 PM  Result Value Ref Range   Sodium 141 135 - 145 mmol/L   Potassium 3.7 3.5 - 5.1 mmol/L   Chloride 103 98 - 111 mmol/L   CO2 27 22 - 32 mmol/L   Glucose, Bld 116 (H) 70 - 99 mg/dL    Comment: Glucose reference range applies only to samples taken after fasting for at least 8 hours.   BUN 15 8 - 23 mg/dL   Creatinine, Ser 0.83 0.44 - 1.00 mg/dL   Calcium 10.5 (H) 8.9 - 10.3 mg/dL   Total Protein 7.8 6.5 - 8.1 g/dL   Albumin 4.2 3.5 - 5.0 g/dL   AST 15 15 - 41 U/L   ALT 12 0 - 44 U/L   Alkaline Phosphatase 70 38 - 126 U/L   Total Bilirubin 0.6 0.3 - 1.2 mg/dL   GFR, Estimated >60 >60 mL/min    Comment: (NOTE) Calculated using the CKD-EPI Creatinine Equation (2021)    Anion gap 11 5 - 15    Comment: Performed at [facility], [address] [place], [place] [address]    CT KNEE RIGHT WO CONTRAST  Result Date: 05/31/2022 CLINICAL DATA:  [place].  Remote history of trauma. EXAM: CT OF THE LOWER RIGHT FEMUR WITHOUT CONTRAST, CT OF THE RIGHT KNEE WITHOUT CONTRAST TECHNIQUE: Multidetector CT imaging of the right lower extremity was performed according to the standard protocol. RADIATION DOSE REDUCTION: This exam was performed according to the departmental dose-optimization  program which includes automated exposure control, adjustment of the mA and/or kV according to patient size and/or use of iterative reconstruction technique. COMPARISON:  Radiographs 05/30/2022 FINDINGS: The right hip is normally located. Moderate hip joint degenerative changes but no acute hip fracture is identified. There are 2 cannulated hip screws. The more superior screw is loose and has pulled back significantly. It is protruding from the femoral cortex approximately 3 cm. The long stem intramedullary gamma nail is loose with significant surrounding lucency. The mid to upper shaft femur fracture is ununited. Marked lucency surrounding the distal intramedullary gamma nail and the interlocking distal screws consistent with significant loosening. Moderate degenerative changes involving the knee joint but no intra-articular fracture is identified. There is a largely transverse fracture through the metadiaphyseal region of the proximal tibia without displacement. There is also a largely transverse fracture involving the fibular neck. IMPRESSION: 1. 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 No acute hip or femur fracture. 2. Significant loosening of the long stem intramedullary gamma nail, the most superior dynamic hip screw and the distal interlocking screws. 3. Nondisplaced largely transverse fracture through the metadiaphyseal region of the proximal tibia without intra-articular involvement 4. Transverse fracture involving the fibular neck. Electronically Signed   By: Marijo Sanes M.D.   On: 05/31/2022 10:41   CT FEMUR RIGHT WO CONTRAST  Result Date: 05/31/2022 CLINICAL DATA:  [place].  Remote history of trauma. EXAM: CT OF THE LOWER RIGHT FEMUR WITHOUT CONTRAST, CT OF THE RIGHT KNEE WITHOUT CONTRAST TECHNIQUE: Multidetector CT imaging of the right lower extremity was performed according to the standard protocol. RADIATION DOSE REDUCTION: This exam was performed according to the departmental  dose-optimization program which includes automated exposure control, adjustment of the mA and/or kV according to patient size and/or use of iterative reconstruction technique. COMPARISON:  Radiographs 05/30/2022 FINDINGS: The right hip is normally located. Moderate hip joint degenerative changes but no acute hip fracture is identified. There are 2 cannulated hip screws. The more superior screw is loose and has pulled back significantly. It is protruding from the femoral cortex approximately 3 cm. The long stem intramedullary gamma nail is loose with significant surrounding lucency. The mid to upper shaft femur fracture is ununited. Marked lucency surrounding the distal intramedullary gamma nail and the interlocking distal screws consistent with significant loosening. Moderate degenerative changes involving the knee joint but no intra-articular fracture is identified. There is a largely transverse fracture through the metadiaphyseal region of the proximal tibia without displacement. There is also a largely transverse fracture involving the fibular neck. IMPRESSION: 1. 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 No acute hip or femur fracture. 2. Significant loosening of the long stem intramedullary gamma nail, the most superior dynamic hip screw and the distal interlocking screws. 3. Nondisplaced largely transverse fracture through the metadiaphyseal region of the proximal tibia without intra-articular involvement 4. Transverse fracture involving the fibular neck. Electronically Signed   By: Marijo Sanes M.D.   On: 05/31/2022 10:41   DG Chest 1 View  Result Date: 05/30/2022 CLINICAL DATA:  Recent fall with chest pain, initial encounter EXAM: CHEST  1 VIEW COMPARISON:  08/07/2015 FINDINGS: The heart size and mediastinal contours are within normal limits. Both lungs are clear. The visualized skeletal structures are unremarkable. IMPRESSION: No active disease. Electronically Signed   By: Inez Catalina M.D.    On: 05/30/2022 22:52   DG Hip Unilat W or Wo Pelvis 2-3 Views Right  Result Date: 05/30/2022 CLINICAL DATA:  Recent trip and fall with right hip pain, initial encounter EXAM: DG HIP (WITH OR WITHOUT PELVIS) 2-3V RIGHT COMPARISON:  01/08/2014 FINDINGS: Postsurgical  changes are noted in the right hip and right femur. Considerable callus formation is noted. There is however a lucency identified within the callus suspicious for undisplaced fracture. Pelvic ring is intact. IMPRESSION: Healed fracture in the right femur with callus formation. Postsurgical changes are seen. There is however a lucency identified within the callus suspicious for recurrent fracture. Correlation to point tenderness is recommended. Electronically Signed   By: Inez Catalina M.D.   On: 05/30/2022 22:52   DG Knee Complete 4 Views Right  Result Date: 05/30/2022 CLINICAL DATA:  Recent trip and fall with right knee pain, initial encounter EXAM: RIGHT KNEE - COMPLETE 4+ VIEW COMPARISON:  01/08/2014 FINDINGS: There is a mildly displaced angulated fracture in the proximal tibial metaphysis. Fibular neck fracture is noted as well. Changes of prior operative fixation in the right femur are seen. No gross soft tissue abnormality is noted. IMPRESSION: Proximal tibial metaphyseal fracture as described. Associated fibular neck fracture is seen as well. Electronically Signed   By: Inez Catalina M.D.   On: 05/30/2022 22:49    Review of Systems  HENT:  Negative for ear discharge, ear pain, hearing loss and tinnitus.   Eyes:  Negative for photophobia and pain.  Respiratory:  Negative for cough and shortness of breath.   Cardiovascular:  Negative for chest pain.  Gastrointestinal:  Negative for abdominal pain, nausea and vomiting.  Genitourinary:  Negative for dysuria, flank pain, frequency and urgency.  Musculoskeletal:  Positive for arthralgias (Right knee). Negative for back pain, myalgias and neck pain.  Neurological:  Negative for dizziness  and headaches.  Hematological:  Does not bruise/bleed easily.  Psychiatric/Behavioral:  The patient is not nervous/anxious.    Blood pressure 97/63, pulse 83, temperature 97.9 F (36.6 C), temperature source Oral, resp. rate 18, height '5\' 1"'$  (1.549 m), weight (!) 136.1 kg, SpO2 98 %. Physical Exam Constitutional:      General: She is not in acute distress.    Appearance: She is well-developed. She is not diaphoretic.  HENT:     Head: Normocephalic and atraumatic.  Eyes:     General: No scleral icterus.       Right eye: No discharge.        Left eye: No discharge.     Conjunctiva/sclera: Conjunctivae normal.  Cardiovascular:     Rate and Rhythm: Normal rate and regular rhythm.  Pulmonary:     Effort: Pulmonary effort is normal. No respiratory distress.  Musculoskeletal:     Cervical back: Normal range of motion.     Comments: RLE No traumatic wounds or rash, scattered early ecchymoses about knee, KI in place  Mod TTP knee  No ankle effusion  Sens DPN, SPN, TN intact  Motor EHL, ext, flex, evers 5/5  DP 2+, PT 2+, No significant edema  Skin:    General: Skin is warm and dry.  Neurological:     Mental Status: She is alert.  Psychiatric:        Mood and Affect: Mood normal.        Behavior: Behavior normal.     Assessment/Plan: Right tib/fib fxs -- Plan to treat non-operatively with NWB RLE. Continue KI at all times.    Lisette Abu, PA-C Orthopedic Surgery [phone] 05/31/2022, 1:50 PM

## 2022-05-31 NOTE — ED Notes (Signed)
Pt to CT, alert, NAD, calm, interactive.

## 2022-05-31 NOTE — ED Notes (Signed)
Back from CT, pending results/read. Repositioned. Knee immobilizer re-placed. Returned to monitor. Snack given. Pure wick in place.

## 2022-06-01 LAB — GLUCOSE, CAPILLARY
Glucose-Capillary: 114 mg/dL — ABNORMAL HIGH (ref 70–99)
Glucose-Capillary: 126 mg/dL — ABNORMAL HIGH (ref 70–99)
Glucose-Capillary: 122 mg/dL — ABNORMAL HIGH (ref 70–99)
Glucose-Capillary: 97 mg/dL (ref 70–99)

## 2022-06-01 LAB — CBC
HCT: 30.8 % — ABNORMAL LOW (ref 36.0–46.0)
Hemoglobin: 9.6 g/dL — ABNORMAL LOW (ref 12.0–15.0)
MCH: 29.4 pg (ref 26.0–34.0)
MCHC: 31.2 g/dL (ref 30.0–36.0)
MCV: 94.2 fL (ref 80.0–100.0)
Platelets: 262 10*3/uL (ref 150–400)
RBC: 3.27 MIL/uL — ABNORMAL LOW (ref 3.87–5.11)
RDW: 14.9 % (ref 11.5–15.5)
WBC: 9.1 10*3/uL (ref 4.0–10.5)
nRBC: 0 % (ref 0.0–0.2)

## 2022-06-01 LAB — POTASSIUM: Potassium: 3.9 mmol/L (ref 3.5–5.1)

## 2022-06-01 MED ORDER — GUAIFENESIN 100 MG/5ML PO LIQD: 5.0000 mL | ORAL | Status: DC | PRN

## 2022-06-01 MED ORDER — TRAZODONE HCL 50 MG PO TABS
50.0000 mg | ORAL_TABLET | Freq: Every evening | ORAL | Status: DC | PRN
  Administered 2022-06-02 – 2022-06-08 (×5): 50 mg via ORAL
  Filled 2022-06-01 (×5): qty 1

## 2022-06-01 MED ORDER — INSULIN ASPART 100 UNIT/ML IJ SOLN
0.0000 [IU] | Freq: Three times a day (TID) | INTRAMUSCULAR | Status: DC
  Administered 2022-06-04: 2 [IU] via SUBCUTANEOUS
  Administered 2022-06-07 – 2022-06-08 (×3): 1 [IU] via SUBCUTANEOUS

## 2022-06-01 MED ORDER — HYDRALAZINE HCL 20 MG/ML IJ SOLN: 10.0000 mg | INTRAMUSCULAR | Status: DC | PRN

## 2022-06-01 MED ORDER — METOPROLOL TARTRATE 5 MG/5ML IV SOLN: 5.0000 mg | INTRAVENOUS | Status: DC | PRN

## 2022-06-01 MED ORDER — INSULIN ASPART 100 UNIT/ML IJ SOLN: 0.0000 [IU] | Freq: Every day | INTRAMUSCULAR | Status: DC

## 2022-06-01 MED ORDER — IPRATROPIUM-ALBUTEROL 0.5-2.5 (3) MG/3ML IN SOLN: 3.0000 mL | RESPIRATORY_TRACT | Status: DC | PRN

## 2022-06-01 NOTE — TOC Initial Note (Addendum)
Transition of Care [facility]) - Initial/Assessment Note    Patient Details  Name: [name] [name] MRN: [id number] Date of Birth: 27-Sep-1950  [facility] CM/SW Contact:    Curlene Labrum, RN Phone Number: 06/01/2022, 12:03 PM  Clinical Narrative:                 CM met with the patient at the bedside to discuss transitions of care needs - S/P fall and fracture of Right femur - non surgical treatment.  I spoke with the patient and she currently lives in a mobile home alone and is agreeable to SNF placement.  FL2 was completed and the clinicals were faxed out in the hub for bed offers - will discuss with patient for Medicare choice once bed offers available.  The patient states that she is in need of repairs at her mobile home including holes in her floor.  I gave the patient resources for her to call the [facility] contact at [phone] to assist with repairs.  The patient states that she will follow up.  CM will follow the patient for SNF placement.  06/01/2021 1600 - Patient will be scheduled for surgery this Thursday with Dr. Marcelino Scot per [place], [place].  The patient was agreeable for SNF placement at [facility].  I called [place], CM at facility and she is aware.   Insurance authorization will need to be started by the facility post surgery.   I spoke with the sister and provided her with resource for assistance with home repairs offered through contact provided to the patient earlier.  Sister plans to call and follow up.  Expected Discharge Plan: Skilled Nursing Facility Barriers to Discharge: Continued Medical Work up, SNF Pending bed offer   Patient Goals and CMS Choice Patient states their goals for this hospitalization and ongoing recovery are:: Patient agreeable to SNF placement CMS Medicare.gov Compare Post Acute Care list provided to:: Patient Choice offered to / list presented to : Patient      Expected Discharge Plan and Services   Discharge  Planning Services: CM Consult Post Acute Care Choice: [place] Living arrangements for the past 2 months: Mobile Home                                      Prior Living Arrangements/Services Living arrangements for the past 2 months: Mobile Home Lives with:: Self Patient language and need for interpreter reviewed:: Yes Do you feel safe going back to the place where you live?: Yes      Need for Family Participation in Patient Care: Yes (Comment)   Current home services: DME (WC, CPAP machine, raised toilet seat, RW) Criminal Activity/Legal Involvement Pertinent to Current Situation/Hospitalization: No - Comment as needed  Activities of Daily Living Home Assistive Devices/Equipment: Environmental consultant (specify type), Wheelchair ADL Screening (condition at time of admission) Patient's cognitive ability adequate to safely complete daily activities?: Yes Is the patient deaf or have difficulty hearing?: No Does the patient have difficulty seeing, even when wearing glasses/contacts?: Yes Does the patient have difficulty concentrating, remembering, or making decisions?: No Patient able to express need for assistance with ADLs?: Yes Does the patient have difficulty dressing or bathing?: Yes Independently performs ADLs?: No Communication: Independent Does the patient have difficulty walking or climbing stairs?: Yes Weakness of Legs: Both Weakness of Arms/Hands: None  Permission Sought/Granted Permission sought to share information  with : [place] Freight forwarder, Chartered certified accountant granted to share information with : Yes, Verbal Permission Granted     Permission granted to share info w AGENCY: SNF facilities for placement        Emotional Assessment Appearance:: Appears stated age Attitude/Demeanor/Rapport: Gracious Affect (typically observed): Accepting Orientation: : Oriented to Self, Oriented to Place, Oriented to  Time, Oriented to Situation Alcohol /  Substance Use: Not Applicable Psych Involvement: No (comment)  Admission diagnosis:  Periprosthetic fracture of shaft of femur [L84.8XXA, Z96.649] Closed right tibial fracture [S82.201A] Patient Active Problem List   Diagnosis Date Noted   Closed right tibial fracture 05/31/2022   Periprosthetic fracture of shaft of femur 05/30/2022   Type 2 diabetes mellitus without complication (HCC)    Peripheral neuropathy    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 [place] [id number]: [phone]: [phone]    Social Determinants of Health (SDOH) Social History: SDOH Screenings   Food Insecurity: Food Insecurity Present (05/31/2022)  Housing: Low Risk  (05/31/2022)  Transportation Needs: Unmet Transportation Needs (05/31/2022)  Utilities: At Risk (05/31/2022)  Tobacco Use: Medium Risk (05/31/2022)   SDOH Interventions:     Readmission Risk Interventions    06/01/2022   12:03 PM  Readmission Risk Prevention Plan  Post Dischage Appt Complete  Medication Screening Complete  Transportation Screening Complete

## 2022-06-01 NOTE — Evaluation (Signed)
Physical Therapy Evaluation Patient Details Name: [name] [name] MRN: [id number] DOB: 12/09/1950 Today's Date: 06/01/2022  History of Present Illness  72 yo female admitted 1/14 after stepping in hole with Rt femur fx, non-op, NWB. PMhx: chronic non-healing Rt femur fx, obesity, COPD, HTN, HLD, DM, neuropathy, bipolar  Clinical Impression  PT with lethargy and increased time to fully arouse and participate. Pt lives alone, mostly WC level at baseline with limited ability to care for herself. States she does drive and gets take out or microwaves meals. Pt currently unable to tolerate assist to move RLE but is able to move it minimally with bil UE assist. Pt struggled to achieve long sitting this session and could not sufficiently shift and scoot for mobility. Pt requiring +2 assist and will need lift for OOB. Pt without assist at D/C and will require SNF. Pt with decreased strength, function, mobility and cognition who will benefit from acute therapy to maximize mobility and safety. Pt educated for KI use.        Recommendations for follow up therapy are one component of a multi-disciplinary discharge planning process, led by the attending physician.  Recommendations may be updated based on patient status, additional functional criteria and insurance authorization.  Follow Up Recommendations Skilled nursing-short term rehab (<3 hours/day) Can patient physically be transported by private vehicle: No    Assistance Recommended at Discharge Frequent or constant Supervision/Assistance  Patient can return home with the following  Two people to help with walking and/or transfers;A lot of help with bathing/dressing/bathroom;Assistance with cooking/housework;Direct supervision/assist for medications management;Assist for transportation;Assistance with feeding;Help with stairs or ramp for entrance    Equipment Recommendations Hospital bed;Other (comment) (hoyer lift)  Recommendations for Other Services        Functional Status Assessment Patient has had a recent decline in their functional status and/or demonstrates limited ability to make significant improvements in function in a reasonable and predictable amount of time     Precautions / Restrictions Precautions Precautions: Fall Required Braces or Orthoses: Knee Immobilizer - Right Knee Immobilizer - Right: On at all times Restrictions Weight Bearing Restrictions: Yes RLE Weight Bearing: Non weight bearing      Mobility  Bed Mobility Overal bed mobility: Needs Assistance Bed Mobility: Rolling, Supine to Sit Rolling: Max assist   Supine to sit: Mod assist, HOB elevated     General bed mobility comments: HOB 35 degrees with rail and mod assist to lift trunk fully to long sitting. Pt performed grossly 4x as pt unable to hold and maintain sitting more than grossly 30 sec with pt falling back to supine. Pt unable to scoot hips in sitting and max assist for rolling. Mod assist to slide to [facility] in trendelenburg    Transfers                   General transfer comment: unable    Ambulation/Gait                  Stairs            Wheelchair Mobility    Modified Rankin (Stroke Patients Only)       Balance Overall balance assessment: Needs assistance Sitting-balance support: Bilateral upper extremity supported, Feet supported Sitting balance-Leahy Scale: Poor                                       Pertinent  Vitals/Pain Pain Assessment Pain Assessment: Faces Pain Score: 4  Pain Location: Rt leg with movement Pain Descriptors / Indicators: Guarding, Aching Pain Intervention(s): Limited activity within patient's tolerance, Repositioned, Monitored during session    [place] expects to be discharged to:: [place]: Alone   Type of Home: Mobile home Home Access: Ramped entrance       Home Layout: One level Home Equipment:  Conservation officer, nature (2 wheels);Wheelchair - manual;Tub bench;BSC/3in1      Prior Function Prior Level of Function : Needs assist;Driving             Mobility Comments: walks in bathroom since not WC accessible, uses WC otherwise ADLs Comments: reports she does not clean, picks up food or microwaves. States she performs aDLs but also stating she hasn't changed her shirt for 3 weeks     Hand Dominance        Extremity/Trunk Assessment   Upper Extremity Assessment Upper Extremity Assessment: Generalized weakness    Lower Extremity Assessment Lower Extremity Assessment: Generalized weakness    Cervical / Trunk Assessment Cervical / Trunk Assessment: Other exceptions Cervical / Trunk Exceptions: increased body habitus  Communication   Communication: No difficulties  Cognition Arousal/Alertness: Lethargic Behavior During Therapy: WFL for tasks assessed/performed Overall Cognitive Status: Impaired/Different from baseline Area of Impairment: Problem solving, Following commands, Safety/judgement                       Following Commands: Follows one step commands inconsistently, Follows one step commands with increased time Safety/Judgement: Decreased awareness of safety, Decreased awareness of deficits   Problem Solving: Slow processing, Requires verbal cues, Difficulty sequencing          General Comments      Exercises     Assessment/Plan    PT Assessment Patient needs continued PT services  PT Problem List Decreased strength;Decreased mobility;Decreased safety awareness;Decreased range of motion;Decreased activity tolerance;Decreased cognition;Decreased balance;Decreased knowledge of use of DME;Pain       PT Treatment Interventions DME instruction;Therapeutic activities;Cognitive remediation;Therapeutic exercise;Patient/family education;Balance training;Functional mobility training    PT Goals (Current goals can be found in the Care Plan section)  Acute  Rehab PT Goals Patient Stated Goal: return home PT Goal Formulation: With patient Time For Goal Achievement: 06/15/22 Potential to Achieve Goals: Fair    Frequency Min 2X/week     Co-evaluation               AM-PAC PT "6 Clicks" Mobility  Outcome Measure Help needed turning from your back to your side while in a flat bed without using bedrails?: A Lot Help needed moving from lying on your back to sitting on the side of a flat bed without using bedrails?: A Lot Help needed moving to and from a bed to a chair (including a wheelchair)?: Total Help needed standing up from a chair using your arms (e.g., wheelchair or bedside chair)?: Total Help needed to walk in hospital room?: Total Help needed climbing 3-5 steps with a railing? : Total 6 Click Score: 8    End of Session   Activity Tolerance: Patient limited by fatigue Patient left: in bed;with call bell/phone within reach;with bed alarm set Nurse Communication: Mobility status;Need for lift equipment PT Visit Diagnosis: Other abnormalities of gait and mobility (R26.89);Muscle weakness (generalized) (M62.81);Repeated falls (R29.6)    Time: 7654-6503 PT Time Calculation (min) (ACUTE ONLY): 25 min   Charges:   PT Evaluation $PT Eval Moderate Complexity:  1 Mod PT Treatments $Therapeutic Activity: 8-22 mins        Bayard Males, PT Acute Rehabilitation Services Office: [place] 06/01/2022, 10:22 AM

## 2022-06-01 NOTE — Progress Notes (Signed)
PROGRESS NOTE    [name] [name]  UMP:[id number] DOB: 1950/08/24 DOA: 05/30/2022 PCP: Aletha Halim., PA-C   Brief Narrative:  72 year old with history of frequent falls with frequent falls, COPD, HTN, HLD, DM 2, chronic right femur fracture since 2017, ambulatory dysfunction, diabetic neuropathy sustained a mechanical fall on the right knee.  CT and x-ray showed nondisplaced large transverse fracture metaphyseal region of the proximal tibia, femoral neck and chronic nonhealing femoral shaft fracture.  Ortho was consulted who recommended knee immobilizer and nonweightbearing of the right lower extremity.  PT/OT recommended SNF.   Assessment & Plan:  Principal Problem:   Periprosthetic fracture of shaft of femur Active Problems:   Acute blood loss anemia   Morbid obesity with BMI of 45.0-49.9, adult (HCC)   COPD (chronic obstructive pulmonary disease) (HCC)   Closed right tibial fracture   Right tibial metaphyseal and fibular neck fracture -Secondary to mechanical fall.  Orthopedic consulted, recommending conservative management.  Nonweightbearing on the right lower extremity.  Needs to wear knee immobilizer all the time PT consulted.   Leukocytosis Reactive, no signs of infection.  Will monitor   IIDM2 Peripheral neuropathy -Glucose fairly controlled, continue metformin.  Add sliding scale and Accu-Cheks.  Continue gabapentin   Acute blood loss anemia -Hemodynamically stable, repeat H&H tomorrow   Nonhealing right femur fracture -Outpatient follow-up with orthopedic surgery   HTN -Slowly resume home medication.  IV as needed ordered   Morbid obesity -Patient not interested in calorie control     DVT prophylaxis: Lovenox Code Status: Full code Family Communication:    Status is: Inpatient Will need placement   Subjective: Patient doing well no complaints.  Tells me at home she uses wheelchair and walker to get around.  Lives alone at home.  I explained to her  that it is not safe for her to go home and would benefit from skilled nursing facility   Examination:  General exam: Appears calm and comfortable  Respiratory system: Clear to auscultation. Respiratory effort normal. Cardiovascular system: S1 & S2 heard, RRR. No JVD, murmurs, rubs, gallops or clicks. No pedal edema. Gastrointestinal system: Abdomen is nondistended, soft and nontender. No organomegaly or masses felt. Normal bowel sounds heard. Central nervous system: Alert and oriented. No focal neurological deficits. Extremities: Right lower extremity the immobilizer in place Skin: No rashes, lesions or ulcers Psychiatry: Judgement and insight appear normal. Mood & affect appropriate.     Objective: Vitals:   05/31/22 1155 05/31/22 1800 05/31/22 2005 06/01/22 0324  BP: 97/63 (!) 100/57 101/60 115/62  Pulse: 83 86 80 84  Resp: '18 17 18 18  '$ Temp: 97.9 F (36.6 C)  99.3 F (37.4 C) 99.3 F (37.4 C)  TempSrc: Oral Oral Oral Oral  SpO2: 98% 98% 100% 96%  Weight:      Height:        Intake/Output Summary (Last 24 hours) at 06/01/2022 0725 Last data filed at 06/01/2022 0545 Gross per 24 hour  Intake --  Output 1035 ml  Net -1035 ml   Filed Weights   05/30/22 1924  Weight: (!) 136.1 kg     Data Reviewed:   CBC: Recent Labs  Lab 05/30/22 2144  WBC 14.0*  NEUTROABS 12.3*  HGB 12.8  HCT 40.5  MCV 92.5  PLT 400   Basic Metabolic Panel: Recent Labs  Lab 05/30/22 2144  NA 141  K 3.7  CL 103  CO2 27  GLUCOSE 116*  BUN 15  CREATININE 0.83  CALCIUM 10.5*   GFR: Estimated Creatinine Clearance: 79.2 mL/min (by C-G formula based on SCr of 0.83 mg/dL). Liver Function Tests: Recent Labs  Lab 05/30/22 2144  AST 15  ALT 12  ALKPHOS 70  BILITOT 0.6  PROT 7.8  ALBUMIN 4.2   No results for input(s): "LIPASE", "AMYLASE" in the last 168 hours. No results for input(s): "AMMONIA" in the last 168 hours. Coagulation Profile: No results for input(s): "INR",  "PROTIME" in the last 168 hours. Cardiac Enzymes: No results for input(s): "CKTOTAL", "CKMB", "CKMBINDEX", "TROPONINI" in the last 168 hours. BNP (last 3 results) No results for input(s): "PROBNP" in the last 8760 hours. HbA1C: No results for input(s): "HGBA1C" in the last 72 hours. CBG: No results for input(s): "GLUCAP" in the last 168 hours. Lipid Profile: No results for input(s): "CHOL", "HDL", "LDLCALC", "TRIG", "CHOLHDL", "LDLDIRECT" in the last 72 hours. Thyroid Function Tests: No results for input(s): "TSH", "T4TOTAL", "FREET4", "T3FREE", "THYROIDAB" in the last 72 hours. Anemia Panel: No results for input(s): "VITAMINB12", "FOLATE", "FERRITIN", "TIBC", "IRON", "RETICCTPCT" in the last 72 hours. Sepsis Labs: No results for input(s): "PROCALCITON", "LATICACIDVEN" in the last 168 hours.  No results found for this or any previous visit (from the past 240 hour(s)).       Radiology Studies: CT KNEE RIGHT WO CONTRAST  Result Date: 05/31/2022 CLINICAL DATA:  [place].  Remote history of trauma. EXAM: CT OF THE LOWER RIGHT FEMUR WITHOUT CONTRAST, CT OF THE RIGHT KNEE WITHOUT CONTRAST TECHNIQUE: Multidetector CT imaging of the right lower extremity was performed according to the standard protocol. RADIATION DOSE REDUCTION: This exam was performed according to the departmental dose-optimization program which includes automated exposure control, adjustment of the mA and/or kV according to patient size and/or use of iterative reconstruction technique. COMPARISON:  Radiographs 05/30/2022 FINDINGS: The right hip is normally located. Moderate hip joint degenerative changes but no acute hip fracture is identified. There are 2 cannulated hip screws. The more superior screw is loose and has pulled back significantly. It is protruding from the femoral cortex approximately 3 cm. The long stem intramedullary gamma nail is loose with significant surrounding lucency. The mid to upper shaft femur fracture is  ununited. Marked lucency surrounding the distal intramedullary gamma nail and the interlocking distal screws consistent with significant loosening. Moderate degenerative changes involving the knee joint but no intra-articular fracture is identified. There is a largely transverse fracture through the metadiaphyseal region of the proximal tibia without displacement. There is also a largely transverse fracture involving the fibular neck. IMPRESSION: 1. 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 No acute hip or femur fracture. 2. Significant loosening of the long stem intramedullary gamma nail, the most superior dynamic hip screw and the distal interlocking screws. 3. Nondisplaced largely transverse fracture through the metadiaphyseal region of the proximal tibia without intra-articular involvement 4. Transverse fracture involving the fibular neck. Electronically Signed   By: Marijo Sanes M.D.   On: 05/31/2022 10:41   CT FEMUR RIGHT WO CONTRAST  Result Date: 05/31/2022 CLINICAL DATA:  [place].  Remote history of trauma. EXAM: CT OF THE LOWER RIGHT FEMUR WITHOUT CONTRAST, CT OF THE RIGHT KNEE WITHOUT CONTRAST TECHNIQUE: Multidetector CT imaging of the right lower extremity was performed according to the standard protocol. RADIATION DOSE REDUCTION: This exam was performed according to the departmental dose-optimization program which includes automated exposure control, adjustment of the mA and/or kV according to patient size and/or use of iterative reconstruction technique. COMPARISON:  Radiographs 05/30/2022 FINDINGS: The  right hip is normally located. Moderate hip joint degenerative changes but no acute hip fracture is identified. There are 2 cannulated hip screws. The more superior screw is loose and has pulled back significantly. It is protruding from the femoral cortex approximately 3 cm. The long stem intramedullary gamma nail is loose with significant surrounding lucency. The mid to upper shaft femur  fracture is ununited. Marked lucency surrounding the distal intramedullary gamma nail and the interlocking distal screws consistent with significant loosening. Moderate degenerative changes involving the knee joint but no intra-articular fracture is identified. There is a largely transverse fracture through the metadiaphyseal region of the proximal tibia without displacement. There is also a largely transverse fracture involving the fibular neck. IMPRESSION: 1. 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 No acute hip or femur fracture. 2. Significant loosening of the long stem intramedullary gamma nail, the most superior dynamic hip screw and the distal interlocking screws. 3. Nondisplaced largely transverse fracture through the metadiaphyseal region of the proximal tibia without intra-articular involvement 4. Transverse fracture involving the fibular neck. Electronically Signed   By: Marijo Sanes M.D.   On: 05/31/2022 10:41   DG Chest 1 View  Result Date: 05/30/2022 CLINICAL DATA:  Recent fall with chest pain, initial encounter EXAM: CHEST  1 VIEW COMPARISON:  08/07/2015 FINDINGS: The heart size and mediastinal contours are within normal limits. Both lungs are clear. The visualized skeletal structures are unremarkable. IMPRESSION: No active disease. Electronically Signed   By: Inez Catalina M.D.   On: 05/30/2022 22:52   DG Hip Unilat W or Wo Pelvis 2-3 Views Right  Result Date: 05/30/2022 CLINICAL DATA:  Recent trip and fall with right hip pain, initial encounter EXAM: DG HIP (WITH OR WITHOUT PELVIS) 2-3V RIGHT COMPARISON:  01/08/2014 FINDINGS: Postsurgical changes are noted in the right hip and right femur. Considerable callus formation is noted. There is however a lucency identified within the callus suspicious for undisplaced fracture. Pelvic ring is intact. IMPRESSION: Healed fracture in the right femur with callus formation. Postsurgical changes are seen. There is however a lucency identified  within the callus suspicious for recurrent fracture. Correlation to point tenderness is recommended. Electronically Signed   By: Inez Catalina M.D.   On: 05/30/2022 22:52   DG Knee Complete 4 Views Right  Result Date: 05/30/2022 CLINICAL DATA:  Recent trip and fall with right knee pain, initial encounter EXAM: RIGHT KNEE - COMPLETE 4+ VIEW COMPARISON:  01/08/2014 FINDINGS: There is a mildly displaced angulated fracture in the proximal tibial metaphysis. Fibular neck fracture is noted as well. Changes of prior operative fixation in the right femur are seen. No gross soft tissue abnormality is noted. IMPRESSION: Proximal tibial metaphyseal fracture as described. Associated fibular neck fracture is seen as well. Electronically Signed   By: Inez Catalina M.D.   On: 05/30/2022 22:49        Scheduled Meds:  acetaminophen  1,000 mg Oral Q4H   aspirin EC  81 mg Oral Daily   docusate sodium  100 mg Oral BID   enoxaparin (LOVENOX) injection  0.5 mg/kg Subcutaneous Q24H   ferrous sulfate  650 mg Oral Daily   gabapentin  1,200 mg Oral QHS   gabapentin  600 mg Oral Daily   metFORMIN  500 mg Oral BID WC   pantoprazole  40 mg Oral Daily   Continuous Infusions:   LOS: 1 day   Time spent= 35 mins    Xela Oregel Arsenio Loader, MD [facility]  If 7PM-7AM, please contact night-coverage  06/01/2022, 7:25 AM

## 2022-06-01 NOTE — Consult Note (Signed)
WOC Nurse Consult Note: Reason for Consult:redness and rash bilateral groin Wound type: Intertriginous dermatitis/irritant contact dermatitis  Pressure Injury POA: NA Measurement:NA Wound JIR:CVELFYB Drainage (amount, consistency, odor) NA Periwound intact  Dressing procedure/placement/frequency: Antimicrobial wicking fabric to affected areas, orders updated.   Re consult if needed, will not follow at this time. Thanks  Kristopher Delk [employer], RN,CWOCN, CNS, [place] [phone])

## 2022-06-01 NOTE — NC FL2 (Signed)
[place] LEVEL OF CARE FORM     IDENTIFICATION  Patient Name: [name] [name] Birthdate: 04-Feb-1951 Sex: female Admission Date (Current Location): 05/30/2022  [facility] and [place] Number:  Herbalist and Address:  The . [facility], [place] [address] [place], [place] [address]      Provider Number: [id number]  Attending Physician Name and Address:  Damita Lack, MD  Relative Name and Phone Number:  Johnsie Kindred, sister - [phone]    Current Level of Care: Hospital Recommended Level of Care: [place] Prior Approval Number:    Date Approved/Denied:   PASRR Number: [id number]  Discharge Plan: SNF    Current Diagnoses: Patient Active Problem List   Diagnosis Date Noted   Closed right tibial fracture 05/31/2022   Periprosthetic fracture of shaft of femur 05/30/2022   Type 2 diabetes mellitus without complication ([place])    Peripheral neuropathy    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 01/08/2014   COPD (chronic obstructive pulmonary disease) ([place])    Hypertension    GERD (gastroesophageal reflux disease)    Depression    Bipolar disorder ([place])    Anxiety    Acute blood loss anemia 03/28/2012   Morbid obesity with BMI of 45.0-49.9, adult ([place]) 03/28/2012   Hyponatremia 03/28/2012   Retained hardware right hip 03/28/2012    Orientation RESPIRATION BLADDER Height & Weight     Self, Time, Situation, Place  O2 External catheter Weight: (!) 136.1 kg Height:  '5\' 1"'$  (154.9 cm)  BEHAVIORAL SYMPTOMS/MOOD NEUROLOGICAL BOWEL NUTRITION STATUS      Continent Diet  AMBULATORY STATUS COMMUNICATION OF NEEDS Skin   Total Care Verbally Normal                       Personal Care Assistance Level of Assistance  Bathing, Feeding, Dressing, Total care Bathing Assistance: Maximum assistance Feeding assistance: Independent Dressing Assistance: Maximum assistance     Functional  Limitations Info  Sight, Hearing, Speech Sight Info: Impaired Hearing Info: Adequate Speech Info: Adequate    SPECIAL CARE FACTORS FREQUENCY  PT (By licensed PT), OT (By licensed OT)     PT Frequency: 3-5 x per week OT Frequency: 3-5 x per week            Contractures Contractures Info: Not present    Additional Factors Info  Code Status, Allergies, Psychotropic, Insulin Sliding Scale Code Status Info: Full code Allergies Info: Coffee flavoring, Prednisone, Nystatin Ointment Psychotropic Info: Trazodone Insulin Sliding Scale Info: Novolog Insulin Sliding scale - sensitive scale 3 x per day with meals       Current Medications (06/01/2022):  This is the current hospital active medication list Current Facility-Administered Medications  Medication Dose Route Frequency Provider Last Rate Last Admin   acetaminophen (TYLENOL) tablet 1,000 mg  1,000 mg Oral Q4H Deno Etienne, DO   1,000 mg at 06/01/22 0914   acetaminophen (TYLENOL) tablet 1,300 mg  1,300 mg Oral Q8H PRN Lequita Halt, MD       aspirin EC tablet 81 mg  81 mg Oral Daily Wynetta Fines T, MD   81 mg at 06/01/22 0915   bisacodyl (DULCOLAX) EC tablet 5 mg  5 mg Oral Daily PRN Wynetta Fines T, MD       docusate sodium (COLACE) capsule 100 mg  100 mg Oral BID Wynetta Fines T, MD   100 mg at 06/01/22 [phone]  enoxaparin (LOVENOX) injection 67.5 mg  0.5 mg/kg Subcutaneous Q24H Wynetta Fines T, MD   67.5 mg at 05/31/22 1541   ferrous sulfate tablet 650 mg  650 mg Oral Daily Wynetta Fines T, MD   650 mg at 06/01/22 0915   gabapentin (NEURONTIN) capsule 1,200 mg  1,200 mg Oral QHS Deno Etienne, DO   1,200 mg at 05/31/22 2007   gabapentin (NEURONTIN) capsule 600 mg  600 mg Oral Daily Deno Etienne, DO   600 mg at 06/01/22 0914   guaiFENesin (ROBITUSSIN) 100 MG/5ML liquid 5 mL  5 mL Oral Q4H PRN Amin, Ankit Chirag, MD       hydrALAZINE (APRESOLINE) injection 10 mg  10 mg Intravenous Q4H PRN Amin, Ankit Chirag, MD       HYDROmorphone (DILAUDID)  injection 0.5-1 mg  0.5-1 mg Intravenous Q2H PRN Wynetta Fines T, MD       insulin aspart (novoLOG) injection 0-5 Units  0-5 Units Subcutaneous QHS Amin, Ankit Chirag, MD       insulin aspart (novoLOG) injection 0-9 Units  0-9 Units Subcutaneous TID WC Amin, Ankit Chirag, MD       ipratropium-albuterol (DUONEB) 0.5-2.5 (3) MG/3ML nebulizer solution 3 mL  3 mL Nebulization Q4H PRN Amin, Ankit Chirag, MD       metFORMIN (GLUCOPHAGE) tablet 500 mg  500 mg Oral BID WC Deno Etienne, DO   500 mg at 06/01/22 0915   metoprolol tartrate (LOPRESSOR) injection 5 mg  5 mg Intravenous Q4H PRN Amin, Ankit Chirag, MD       morphine (PF) 4 MG/ML injection 4 mg  4 mg Intravenous Q4H PRN Deno Etienne, DO   4 mg at 05/31/22 0753   ondansetron (ZOFRAN) injection 4 mg  4 mg Intravenous Q6H PRN Deno Etienne, DO   4 mg at 05/31/22 1191   oxyCODONE (Oxy IR/ROXICODONE) immediate release tablet 5 mg  5 mg Oral Q4H PRN Wynetta Fines T, MD   5 mg at 05/31/22 1840   pantoprazole (PROTONIX) EC tablet 40 mg  40 mg Oral Daily Deno Etienne, DO   40 mg at 06/01/22 4782   senna-docusate (Senokot-S) tablet 1 tablet  1 tablet Oral QHS PRN Lequita Halt, MD       traZODone (DESYREL) tablet 50 mg  50 mg Oral QHS PRN Damita Lack, MD         Discharge Medications: Please see discharge summary for a list of discharge medications.  Relevant Imaging Results:  Relevant Lab Results:   Additional Information SS# [id number]  Curlene Labrum, RN

## 2022-06-01 NOTE — Progress Notes (Signed)
Orthopedic Tech Progress Note Patient Details:  [name] [name] Mar 28, 1951 [id number]  Ortho Devices Type of Ortho Device: Other (comment) Ortho Device/Splint Location: Bulky jones Ortho Device/Splint Interventions: Application   Post Interventions Patient Tolerated: Well  Linus Salmons Shanera Meske 06/01/2022, 5:35 PM

## 2022-06-01 NOTE — Progress Notes (Addendum)
Orthopaedic Trauma Service Progress Note  Patient ID: [name] [name] MRN: [id number] DOB/AGE: 1950-10-11 72 y.o.  Subjective:  Doing well Nurse Broers manager at bedside   Pt sustained fall after falling through hole in her trailer floor.  States that there are many areas of her trailer that are in disrepair   Does not really go out side. Uses wheelchair in the trailer and a walker IF she goes outside   [place] she has not been particularly active over the last several months   Denies pain in R thigh   Lives alone.  Will need SNF   ROS As above  Objective:   VITALS:   Vitals:   05/31/22 1800 05/31/22 2005 06/01/22 0324 06/01/22 0741  BP: (!) 100/57 101/60 115/62 (!) 113/56  Pulse: 86 80 84 79  Resp: '17 18 18 17  '$ Temp:  99.3 F (37.4 C) 99.3 F (37.4 C) 98 F (36.7 C)  TempSrc: Oral Oral Oral Oral  SpO2: 98% 100% 96% 96%  Weight:      Height:        Estimated body mass index is 56.69 kg/m as calculated from the following:   Height as of this encounter: '5\' 1"'$  (1.549 m).   Weight as of this encounter: 136.1 kg.   Intake/Output      01/15 0701 01/16 0700 01/16 0701 01/17 0700   Urine (mL/kg/hr) 1035 (0.3)    Total Output 1035    Net -1035           LABS  Results for orders placed or performed during the hospital encounter of 05/30/22 (from the past 24 hour(s))  CBC     Status: Abnormal   Collection Time: 06/01/22  7:06 AM  Result Value Ref Range   WBC 9.1 4.0 - 10.5 K/uL   RBC 3.27 (L) 3.87 - 5.11 MIL/uL   Hemoglobin 9.6 (L) 12.0 - 15.0 g/dL   HCT 30.8 (L) 36.0 - 46.0 %   MCV 94.2 80.0 - 100.0 fL   MCH 29.4 26.0 - 34.0 pg   MCHC 31.2 30.0 - 36.0 g/dL   RDW 14.9 11.5 - 15.5 %   Platelets 262 150 - 400 K/uL   nRBC 0.0 0.0 - 0.2 %  Potassium     Status: None   Collection Time: 06/01/22  7:06 AM  Result Value Ref Range   Potassium 3.9 3.5 - 5.1 mmol/L  Glucose, capillary      Status: Abnormal   Collection Time: 06/01/22  7:41 AM  Result Value Ref Range   Glucose-Capillary 114 (H) 70 - 99 mg/dL  Glucose, capillary     Status: Abnormal   Collection Time: 06/01/22 11:46 AM  Result Value Ref Range   Glucose-Capillary 126 (H) 70 - 99 mg/dL     PHYSICAL EXAM:   Gen: sitting up in bed, pleasant  Ext:       R Lower extremity    Moderate swelling R lower leg and ecchymosis noted  No open wounds   TTP proximal R tibia   + DP pulse  No DCT   Compartments soft   Distal motor and sensory functions at baseline and appear grossly intact    Assessment/Plan:     Principal Problem:   Periprosthetic fracture of shaft of femur Active Problems:  Acute blood loss anemia   Morbid obesity with BMI of 45.0-49.9, adult (HCC)   COPD (chronic obstructive pulmonary disease) (HCC)   Closed right tibial fracture   Anti-infectives (From admission, onward)    None     .   72 y/o female s/p fall with extra-articular R proximal tibia fracture and chronic nonunion of R proximal femur    -Acute R extra-articular proximal tibia fracture   Discussed operative versus nonoperative management.  We feel that addressing this injury surgically early on would give the patient the best chance to make recovery to baseline status.  High risk of displacement with nonoperative treatment.  Also with surgical intervention we will allow for her to begin knee range of motion immediately.  This should also allow for easier mobilization.   Patient is in complete agreement with the plan    Plan for OR on Thursday   Nonweightbearing right leg for 6 weeks with or without surgery   Bulky compressive dressing from foot to thigh to help with swelling.  Continue with knee immobilizer.  Ice and elevate for swelling control    - Pain management:  Multimodal   - ABL anemia/Hemodynamics  Stable  - Medical issues   Per primary   - DVT/PE prophylaxis:  Weightbased lovenox   -  Metabolic Bone Disease:  Osteoporosis from minimal weightbearing   Check vitamin d levels   - Activity:  As above  - Impediments to fracture healing:  Poor bone quality   Diabetes   Thyroid disease   - Dispo:  OR Thursday   Ok to work with therapy   Will need snf post acute care     [name], PA-C [phone] (C) 06/01/2022, 2:49 PM  [facility] [place] [phone] Jenetta [phone] (F)    After 5pm and on the weekends please log on to Amion, go to orthopaedics and the look under the [facility] Call for the provider(s) on call. You can also call our office at [phone] and then follow the prompts to be connected to the call team.  Patient ID: [name] [name], female   DOB: 1951-04-27, 72 y.o.   MRN: [id number]

## 2022-06-02 DIAGNOSIS — J449 Chronic obstructive pulmonary disease, unspecified: Secondary | ICD-10-CM

## 2022-06-02 DIAGNOSIS — Z6841 Body Mass Index (BMI) 40.0 and over, adult: Secondary | ICD-10-CM

## 2022-06-02 DIAGNOSIS — M978XXA Periprosthetic fracture around other internal prosthetic joint, initial encounter: Secondary | ICD-10-CM | POA: Diagnosis not present

## 2022-06-02 DIAGNOSIS — Z96649 Presence of unspecified artificial hip joint: Secondary | ICD-10-CM | POA: Diagnosis not present

## 2022-06-02 LAB — BASIC METABOLIC PANEL
Anion gap: 7 (ref 5–15)
BUN: 16 mg/dL (ref 8–23)
CO2: 29 mmol/L (ref 22–32)
Calcium: 9.4 mg/dL (ref 8.9–10.3)
Chloride: 102 mmol/L (ref 98–111)
Creatinine, Ser: 0.97 mg/dL (ref 0.44–1.00)
GFR, Estimated: >60 mL/min (ref >60)
Glucose, Bld: 105 mg/dL — ABNORMAL HIGH (ref 70–99)
Potassium: 4.2 mmol/L (ref 3.5–5.1)
Sodium: 138 mmol/L (ref 135–145)

## 2022-06-02 LAB — GLUCOSE, CAPILLARY
Glucose-Capillary: 113 mg/dL — ABNORMAL HIGH (ref 70–99)
Glucose-Capillary: 116 mg/dL — ABNORMAL HIGH (ref 70–99)
Glucose-Capillary: 106 mg/dL — ABNORMAL HIGH (ref 70–99)
Glucose-Capillary: 131 mg/dL — ABNORMAL HIGH (ref 70–99)

## 2022-06-02 LAB — CBC
HCT: 33.9 % — ABNORMAL LOW (ref 36.0–46.0)
Hemoglobin: 10.4 g/dL — ABNORMAL LOW (ref 12.0–15.0)
MCH: 29.1 pg (ref 26.0–34.0)
MCHC: 30.7 g/dL (ref 30.0–36.0)
MCV: 95 fL (ref 80.0–100.0)
Platelets: 272 10*3/uL (ref 150–400)
RBC: 3.57 MIL/uL — ABNORMAL LOW (ref 3.87–5.11)
RDW: 14.6 % (ref 11.5–15.5)
WBC: 8.6 10*3/uL (ref 4.0–10.5)
nRBC: 0 % (ref 0.0–0.2)

## 2022-06-02 LAB — SURGICAL PCR SCREEN
MRSA, PCR: NEGATIVE
Staphylococcus aureus: NEGATIVE

## 2022-06-02 LAB — VITAMIN D 25 HYDROXY (VIT D DEFICIENCY, FRACTURES): Vit D, 25-Hydroxy: 24.29 ng/mL — ABNORMAL LOW (ref 30–100)

## 2022-06-02 LAB — MAGNESIUM: Magnesium: 1.6 mg/dL — ABNORMAL LOW (ref 1.7–2.4)

## 2022-06-02 MED ORDER — ENOXAPARIN SODIUM 80 MG/0.8ML IJ SOSY
0.5000 mg/kg | PREFILLED_SYRINGE | INTRAMUSCULAR | Status: DC
  Administered 2022-06-04 – 2022-06-05 (×2): 67.5 mg via SUBCUTANEOUS
  Filled 2022-06-02 (×2): qty 0.8

## 2022-06-02 MED ORDER — CEFAZOLIN IN SODIUM CHLORIDE 3-0.9 GM/100ML-% IV SOLN
3.0000 g | Freq: Once | INTRAVENOUS | Status: AC
  Administered 2022-06-03: 3 g via INTRAVENOUS
  Filled 2022-06-02: qty 100

## 2022-06-02 MED ORDER — MAGNESIUM SULFATE 4 GM/100ML IV SOLN
4.0000 g | Freq: Once | INTRAVENOUS | Status: AC
  Administered 2022-06-02: 4 g via INTRAVENOUS
  Filled 2022-06-02: qty 100

## 2022-06-02 MED ORDER — MUPIROCIN 2 % EX OINT
1.0000 | TOPICAL_OINTMENT | Freq: Two times a day (BID) | CUTANEOUS | Status: DC
  Administered 2022-06-02 – 2022-06-07 (×7): 1 via NASAL
  Filled 2022-06-02 (×3): qty 22

## 2022-06-02 MED ORDER — VITAMIN D 25 MCG (1000 UNIT) PO TABS
1000.0000 [IU] | ORAL_TABLET | Freq: Every day | ORAL | Status: DC
  Administered 2022-06-02 – 2022-06-09 (×7): 1000 [IU] via ORAL
  Filled 2022-06-02 (×6): qty 1

## 2022-06-02 NOTE — Progress Notes (Signed)
OT Cancellation Note  Patient Details Name: [name] [name] MRN: [id number] DOB: Feb 28, 1951   Cancelled Treatment:    Reason Eval/Treat Not Completed: Pain limiting ability to participate;Other (comment) Pt noted with significant pain during PT attempts 1/16 when plan was for nonoperative treatment. Noted plans now for sx on 1/18 so will hold and follow up for OT eval post op.  Layla Maw 06/02/2022, 7:25 AM

## 2022-06-02 NOTE — Progress Notes (Signed)
PROGRESS NOTE    [name] [name]  RPR:[id number] DOB: [birth year] DOA: 05/30/2022 PCP: Aletha Halim., PA-C   Brief Narrative:  72 year old with history of frequent falls with frequent falls, COPD, HTN, HLD, DM 2, chronic right femur fracture since 2017, ambulatory dysfunction, diabetic neuropathy sustained a mechanical fall on the right knee.  CT and x-ray showed nondisplaced large transverse fracture metaphyseal region of the proximal tibia, femoral neck and chronic nonhealing femoral shaft fracture.  Ortho was consulted who recommended knee immobilizer and nonweightbearing of the right lower extremity.  PT/OT recommended SNF.  1/17: Vitals stable.  Due to excessive pain and difficulty participating with PT orthopedic surgery decided to take her to the OR on 06/03/2022. Vitamin D level low at 24-started on supplement Hypomagnesemia with magnesium of 1.6-ordered replacement  Assessment & Plan:  Principal Problem:   Periprosthetic fracture of shaft of femur Active Problems:   Acute blood loss anemia   Morbid obesity with BMI of 45.0-49.9, adult (HCC)   COPD (chronic obstructive pulmonary disease) (HCC)   Closed right tibial fracture   Right tibial metaphyseal and fibular neck fracture -Secondary to mechanical fall.  Orthopedic consulted, recommending conservative management.  Nonweightbearing on the right lower extremity.  Needs to wear knee immobilizer all the time PT consulted. -Going to the OR on 1/18 for repair.  Low vitamin D levels.  Found to be at 36. -Start her on an vitamin D supplement 1000 units daily  Hypomagnesemia..  Magnesium at 1.6 -Replace magnesium and monitor   Leukocytosis.  Resolved Reactive, no signs of infection.  Will monitor   IIDM2 Peripheral neuropathy -Glucose fairly controlled, continue metformin.  Add sliding scale and Accu-Cheks.  Continue gabapentin   Acute blood loss anemia -Hemodynamically stable, hemoglobin at 10.4 today.   Nonhealing right  femur fracture -Outpatient follow-up with orthopedic surgery   HTN -Slowly resume home medication.  IV as needed ordered   Morbid obesity -Patient not interested in calorie control  DVT prophylaxis: Lovenox Code Status: Full code Family Communication: Discussed with patient  Status is: Inpatient Will need placement   Subjective: Patient was seen and examined today.  Pain at the time of visit was well-controlled after taking pain medications.  Examination:  General.  Obese lady, in no acute distress. Pulmonary.  Lungs clear bilaterally, normal respiratory effort. CV.  Regular rate and rhythm, no JVD, rub or murmur. Abdomen.  Soft, nontender, nondistended, BS positive. CNS.  Alert and oriented .  No focal neurologic deficit. Extremities.  No edema, no cyanosis, pulses intact and symmetrical.  Right knee with previous and right foot with Ace wrap Psychiatry.  Judgment and insight appears normal.    Objective: Vitals:   06/01/22 1935 06/01/22 2020 06/02/22 0310 06/02/22 0727  BP: 135/69 (!) 143/66 132/68 (!) 149/86  Pulse: 78 80 73 79  Resp:  18  18  Temp: 97.6 F (36.4 C) 98.4 F (36.9 C) 97.6 F (36.4 C) 98.4 F (36.9 C)  TempSrc: Oral Oral Oral Oral  SpO2: 97% 99% 100% 100%  Weight:      Height:        Intake/Output Summary (Last 24 hours) at 06/02/2022 0808 Last data filed at 06/02/2022 0313 Gross per 24 hour  Intake --  Output 950 ml  Net -950 ml    Filed Weights   05/30/22 1924  Weight: (!) 136.1 kg     Data Reviewed:   CBC: Recent Labs  Lab 05/30/22 2144 06/01/22 0706 06/02/22 0645  WBC  14.0* 9.1 8.6  NEUTROABS 12.3*  --   --   HGB 12.8 9.6* 10.4*  HCT 40.5 30.8* 33.9*  MCV 92.5 94.2 95.0  PLT 357 262 338    Basic Metabolic Panel: Recent Labs  Lab 05/30/22 2144 06/01/22 0706  NA 141  --   K 3.7 3.9  CL 103  --   CO2 27  --   GLUCOSE 116*  --   BUN 15  --   CREATININE 0.83  --   CALCIUM 10.5*  --     GFR: Estimated  Creatinine Clearance: 79.2 mL/min (by C-G formula based on SCr of 0.83 mg/dL). Liver Function Tests: Recent Labs  Lab 05/30/22 2144  AST 15  ALT 12  ALKPHOS 70  BILITOT 0.6  PROT 7.8  ALBUMIN 4.2    No results for input(s): "LIPASE", "AMYLASE" in the last 168 hours. No results for input(s): "AMMONIA" in the last 168 hours. Coagulation Profile: No results for input(s): "INR", "PROTIME" in the last 168 hours. Cardiac Enzymes: No results for input(s): "CKTOTAL", "CKMB", "CKMBINDEX", "TROPONINI" in the last 168 hours. BNP (last 3 results) No results for input(s): "PROBNP" in the last 8760 hours. HbA1C: No results for input(s): "HGBA1C" in the last 72 hours. CBG: Recent Labs  Lab 06/01/22 0741 06/01/22 1146 06/01/22 1553 06/01/22 2003 06/02/22 0726  GLUCAP 114* 126* 122* 97 113*   Lipid Profile: No results for input(s): "CHOL", "HDL", "LDLCALC", "TRIG", "CHOLHDL", "LDLDIRECT" in the last 72 hours. Thyroid Function Tests: No results for input(s): "TSH", "T4TOTAL", "FREET4", "T3FREE", "THYROIDAB" in the last 72 hours. Anemia Panel: No results for input(s): "VITAMINB12", "FOLATE", "FERRITIN", "TIBC", "IRON", "RETICCTPCT" in the last 72 hours. Sepsis Labs: No results for input(s): "PROCALCITON", "LATICACIDVEN" in the last 168 hours.  No results found for this or any previous visit (from the past 240 hour(s)).       Radiology Studies: CT KNEE RIGHT WO CONTRAST  Result Date: 05/31/2022 CLINICAL DATA:  [place].  Remote history of trauma. EXAM: CT OF THE LOWER RIGHT FEMUR WITHOUT CONTRAST, CT OF THE RIGHT KNEE WITHOUT CONTRAST TECHNIQUE: Multidetector CT imaging of the right lower extremity was performed according to the standard protocol. RADIATION DOSE REDUCTION: This exam was performed according to the departmental dose-optimization program which includes automated exposure control, adjustment of the mA and/or kV according to patient size and/or use of iterative reconstruction  technique. COMPARISON:  Radiographs 05/30/2022 FINDINGS: The right hip is normally located. Moderate hip joint degenerative changes but no acute hip fracture is identified. There are 2 cannulated hip screws. The more superior screw is loose and has pulled back significantly. It is protruding from the femoral cortex approximately 3 cm. The long stem intramedullary gamma nail is loose with significant surrounding lucency. The mid to upper shaft femur fracture is ununited. Marked lucency surrounding the distal intramedullary gamma nail and the interlocking distal screws consistent with significant loosening. Moderate degenerative changes involving the knee joint but no intra-articular fracture is identified. There is a largely transverse fracture through the metadiaphyseal region of the proximal tibia without displacement. There is also a largely transverse fracture involving the fibular neck. IMPRESSION: 1. 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 No acute hip or femur fracture. 2. Significant loosening of the long stem intramedullary gamma nail, the most superior dynamic hip screw and the distal interlocking screws. 3. Nondisplaced largely transverse fracture through the metadiaphyseal region of the proximal tibia without intra-articular involvement 4. Transverse fracture involving the fibular neck. Electronically  Signed   By: Marijo Sanes M.D.   On: 05/31/2022 10:41   CT FEMUR RIGHT WO CONTRAST  Result Date: 05/31/2022 CLINICAL DATA:  [place].  Remote history of trauma. EXAM: CT OF THE LOWER RIGHT FEMUR WITHOUT CONTRAST, CT OF THE RIGHT KNEE WITHOUT CONTRAST TECHNIQUE: Multidetector CT imaging of the right lower extremity was performed according to the standard protocol. RADIATION DOSE REDUCTION: This exam was performed according to the departmental dose-optimization program which includes automated exposure control, adjustment of the mA and/or kV according to patient size and/or use of iterative  reconstruction technique. COMPARISON:  Radiographs 05/30/2022 FINDINGS: The right hip is normally located. Moderate hip joint degenerative changes but no acute hip fracture is identified. There are 2 cannulated hip screws. The more superior screw is loose and has pulled back significantly. It is protruding from the femoral cortex approximately 3 cm. The long stem intramedullary gamma nail is loose with significant surrounding lucency. The mid to upper shaft femur fracture is ununited. Marked lucency surrounding the distal intramedullary gamma nail and the interlocking distal screws consistent with significant loosening. Moderate degenerative changes involving the knee joint but no intra-articular fracture is identified. There is a largely transverse fracture through the metadiaphyseal region of the proximal tibia without displacement. There is also a largely transverse fracture involving the fibular neck. IMPRESSION: 1. 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 No acute hip or femur fracture. 2. Significant loosening of the long stem intramedullary gamma nail, the most superior dynamic hip screw and the distal interlocking screws. 3. Nondisplaced largely transverse fracture through the metadiaphyseal region of the proximal tibia without intra-articular involvement 4. Transverse fracture involving the fibular neck. Electronically Signed   By: Marijo Sanes M.D.   On: 05/31/2022 10:41        Scheduled Meds:  acetaminophen  1,000 mg Oral Q4H   aspirin EC  81 mg Oral Daily   docusate sodium  100 mg Oral BID   enoxaparin (LOVENOX) injection  0.5 mg/kg Subcutaneous Q24H   ferrous sulfate  650 mg Oral Daily   gabapentin  1,200 mg Oral QHS   gabapentin  600 mg Oral Daily   insulin aspart  0-5 Units Subcutaneous QHS   insulin aspart  0-9 Units Subcutaneous TID WC   metFORMIN  500 mg Oral BID WC   pantoprazole  40 mg Oral Daily   Continuous Infusions:   LOS: 2 days   Time spent= 38  mins  This record has been created using Systems analyst. Errors have been sought and corrected,but may not always be located. Such creation errors do not reflect on the standard of care.   Lorella Nimrod, MD [facility]  If 7PM-7AM, please contact night-coverage  06/02/2022, 8:08 AM

## 2022-06-02 NOTE — Progress Notes (Addendum)
I have seen and examined the patient, I agree with the findings below by Ainsley Spinner, PA-C, and I have directed the plan for treatment as noted.  Rozanna Box, MD 06/03/2022 2:38 PM          Orthopaedic Trauma Service  OR tomorrow for ORIF R proximal tibia NWB x 6 weeks  Unrestricted ROM R knee post op   NPO after MN  Discussed surgery with pt and she wishes to proceed   Will need snf post op   Mild vitamin d insufficiency   Supplement   Anticipate minimal blood loss as we plan to do a minimally invasive surgery  Hold PM lovenox and resume on 06/04/2022  Pt scheduled for about 1400 tomorrow but I suspect it will go sooner, possibly as early as noon   [name], PA-C [phone] (C) 06/02/2022, 9:36 AM  [facility] [place] [phone] [name] (F)      Patient ID: [name] [name], female   DOB: 1950-12-21, 72 y.o.   MRN: [id number]

## 2022-06-02 NOTE — Progress Notes (Signed)
Consent:   Per patient, provider "has not talked to me in detail about procedure." Nurse unable to obtain consent at this time. Oncoming nurse made aware.

## 2022-06-03 ENCOUNTER — Inpatient Hospital Stay (HOSPITAL_COMMUNITY): Payer: Medicare (Managed Care) | Admitting: Anesthesiology

## 2022-06-03 ENCOUNTER — Inpatient Hospital Stay (HOSPITAL_COMMUNITY): Payer: Medicare (Managed Care)

## 2022-06-03 ENCOUNTER — Encounter (HOSPITAL_COMMUNITY): Payer: Self-pay | Admitting: Internal Medicine

## 2022-06-03 ENCOUNTER — Other Ambulatory Visit: Payer: Self-pay

## 2022-06-03 ENCOUNTER — Encounter (HOSPITAL_COMMUNITY)
Admission: EM | Disposition: A | Discharge status: Skilled Nursing Facility | Payer: Self-pay | Source: Home / Self Care | Attending: Internal Medicine

## 2022-06-03 DIAGNOSIS — I10 Essential (primary) hypertension: Secondary | ICD-10-CM

## 2022-06-03 DIAGNOSIS — S82141A Displaced bicondylar fracture of right tibia, initial encounter for closed fracture: Secondary | ICD-10-CM

## 2022-06-03 DIAGNOSIS — Z87891 Personal history of nicotine dependence: Secondary | ICD-10-CM

## 2022-06-03 DIAGNOSIS — J449 Chronic obstructive pulmonary disease, unspecified: Secondary | ICD-10-CM | POA: Diagnosis not present

## 2022-06-03 HISTORY — PX: OPEN REDUCTION INTERNAL FIXATION (ORIF) TIBIA/FIBULA FRACTURE: SHX5992

## 2022-06-03 LAB — CBC
HCT: 33.5 % — ABNORMAL LOW (ref 36.0–46.0)
Hemoglobin: 10.5 g/dL — ABNORMAL LOW (ref 12.0–15.0)
MCH: 29 pg (ref 26.0–34.0)
MCHC: 31.3 g/dL (ref 30.0–36.0)
MCV: 92.5 fL (ref 80.0–100.0)
Platelets: 318 10*3/uL (ref 150–400)
RBC: 3.62 MIL/uL — ABNORMAL LOW (ref 3.87–5.11)
RDW: 14.2 % (ref 11.5–15.5)
WBC: 9.1 10*3/uL (ref 4.0–10.5)
nRBC: 0 % (ref 0.0–0.2)

## 2022-06-03 LAB — GLUCOSE, CAPILLARY
Glucose-Capillary: 113 mg/dL — ABNORMAL HIGH (ref 70–99)
Glucose-Capillary: 108 mg/dL — ABNORMAL HIGH (ref 70–99)
Glucose-Capillary: 100 mg/dL — ABNORMAL HIGH (ref 70–99)
Glucose-Capillary: 102 mg/dL — ABNORMAL HIGH (ref 70–99)
Glucose-Capillary: 152 mg/dL — ABNORMAL HIGH (ref 70–99)

## 2022-06-03 LAB — BASIC METABOLIC PANEL
Anion gap: 7 (ref 5–15)
BUN: 15 mg/dL (ref 8–23)
CO2: 28 mmol/L (ref 22–32)
Calcium: 9.2 mg/dL (ref 8.9–10.3)
Chloride: 102 mmol/L (ref 98–111)
Creatinine, Ser: 0.94 mg/dL (ref 0.44–1.00)
GFR, Estimated: >60 mL/min (ref >60)
Glucose, Bld: 114 mg/dL — ABNORMAL HIGH (ref 70–99)
Potassium: 4 mmol/L (ref 3.5–5.1)
Sodium: 137 mmol/L (ref 135–145)

## 2022-06-03 LAB — MAGNESIUM: Magnesium: 2 mg/dL (ref 1.7–2.4)

## 2022-06-03 SURGERY — OPEN REDUCTION INTERNAL FIXATION (ORIF) TIBIA/FIBULA FRACTURE
Anesthesia: General | Laterality: Right

## 2022-06-03 MED ORDER — CHLORHEXIDINE GLUCONATE 0.12 % MT SOLN
OROMUCOSAL | Status: AC
  Administered 2022-06-03: 15 mL
  Filled 2022-06-03: qty 15

## 2022-06-03 MED ORDER — ONDANSETRON HCL 4 MG/2ML IJ SOLN
INTRAMUSCULAR | Status: DC | PRN
  Administered 2022-06-03: 4 mg via INTRAVENOUS

## 2022-06-03 MED ORDER — FENTANYL CITRATE (PF) 100 MCG/2ML IJ SOLN
25.0000 ug | INTRAMUSCULAR | Status: DC | PRN
  Administered 2022-06-03 (×3): 50 ug via INTRAVENOUS

## 2022-06-03 MED ORDER — ONDANSETRON HCL 4 MG/2ML IJ SOLN
INTRAMUSCULAR | Status: AC
  Filled 2022-06-03: qty 2

## 2022-06-03 MED ORDER — DIPHENHYDRAMINE HCL 50 MG/ML IJ SOLN
INTRAMUSCULAR | Status: DC | PRN
  Administered 2022-06-03: 12.5 mg via INTRAVENOUS

## 2022-06-03 MED ORDER — FENTANYL CITRATE (PF) 100 MCG/2ML IJ SOLN
INTRAMUSCULAR | Status: AC
  Filled 2022-06-03: qty 2

## 2022-06-03 MED ORDER — LIDOCAINE 2% (20 MG/ML) 5 ML SYRINGE
INTRAMUSCULAR | Status: AC
  Filled 2022-06-03: qty 5

## 2022-06-03 MED ORDER — FENTANYL CITRATE (PF) 250 MCG/5ML IJ SOLN
INTRAMUSCULAR | Status: DC | PRN
  Administered 2022-06-03: 50 ug via INTRAVENOUS
  Administered 2022-06-03: 100 ug via INTRAVENOUS
  Administered 2022-06-03 (×2): 50 ug via INTRAVENOUS

## 2022-06-03 MED ORDER — PROPOFOL 10 MG/ML IV BOLUS
INTRAVENOUS | Status: DC | PRN
  Administered 2022-06-03: 30 mg via INTRAVENOUS
  Administered 2022-06-03: 100 mg via INTRAVENOUS

## 2022-06-03 MED ORDER — ORAL CARE MOUTH RINSE: 15.0000 mL | Freq: Once | OROMUCOSAL | Status: AC

## 2022-06-03 MED ORDER — FENTANYL CITRATE (PF) 250 MCG/5ML IJ SOLN
INTRAMUSCULAR | Status: AC
  Filled 2022-06-03: qty 5

## 2022-06-03 MED ORDER — CHLORHEXIDINE GLUCONATE 0.12 % MT SOLN
15.0000 mL | Freq: Once | OROMUCOSAL | Status: AC
  Administered 2022-06-03: 15 mL via OROMUCOSAL

## 2022-06-03 MED ORDER — LABETALOL HCL 5 MG/ML IV SOLN
INTRAVENOUS | Status: DC | PRN
  Administered 2022-06-03 (×2): 5 mg via INTRAVENOUS

## 2022-06-03 MED ORDER — DEXAMETHASONE SODIUM PHOSPHATE 10 MG/ML IJ SOLN
INTRAMUSCULAR | Status: DC | PRN
  Administered 2022-06-03: 10 mg via INTRAVENOUS

## 2022-06-03 MED ORDER — PROPOFOL 10 MG/ML IV BOLUS
INTRAVENOUS | Status: AC
  Filled 2022-06-03: qty 20

## 2022-06-03 MED ORDER — LIDOCAINE 2% (20 MG/ML) 5 ML SYRINGE
INTRAMUSCULAR | Status: DC | PRN
  Administered 2022-06-03: 100 mg via INTRAVENOUS

## 2022-06-03 MED ORDER — SUGAMMADEX SODIUM 200 MG/2ML IV SOLN
INTRAVENOUS | Status: DC | PRN
  Administered 2022-06-03: 100 mg via INTRAVENOUS
  Administered 2022-06-03: 300 mg via INTRAVENOUS

## 2022-06-03 MED ORDER — ACETAMINOPHEN 500 MG PO TABS
1000.0000 mg | ORAL_TABLET | Freq: Once | ORAL | Status: AC
  Administered 2022-06-03: 1000 mg via ORAL
  Filled 2022-06-03: qty 2

## 2022-06-03 MED ORDER — AMISULPRIDE (ANTIEMETIC) 5 MG/2ML IV SOLN: 10.0000 mg | Freq: Once | INTRAVENOUS | Status: DC | PRN

## 2022-06-03 MED ORDER — ROCURONIUM BROMIDE 10 MG/ML (PF) SYRINGE
PREFILLED_SYRINGE | INTRAVENOUS | Status: DC | PRN
  Administered 2022-06-03: 80 mg via INTRAVENOUS
  Administered 2022-06-03: 20 mg via INTRAVENOUS

## 2022-06-03 MED ORDER — DEXAMETHASONE SODIUM PHOSPHATE 10 MG/ML IJ SOLN
INTRAMUSCULAR | Status: AC
  Filled 2022-06-03: qty 1

## 2022-06-03 MED ORDER — CEFAZOLIN SODIUM-DEXTROSE 2-4 GM/100ML-% IV SOLN
2.0000 g | Freq: Three times a day (TID) | INTRAVENOUS | Status: AC
  Administered 2022-06-03 – 2022-06-04 (×3): 2 g via INTRAVENOUS
  Filled 2022-06-03 (×3): qty 100

## 2022-06-03 MED ORDER — 0.9 % SODIUM CHLORIDE (POUR BTL) OPTIME
TOPICAL | Status: DC | PRN
  Administered 2022-06-03: 1000 mL

## 2022-06-03 MED ORDER — LABETALOL HCL 5 MG/ML IV SOLN
INTRAVENOUS | Status: AC
  Filled 2022-06-03: qty 4

## 2022-06-03 MED ORDER — HYDRALAZINE HCL 20 MG/ML IJ SOLN
10.0000 mg | Freq: Once | INTRAMUSCULAR | Status: AC
  Administered 2022-06-03: 10 mg via INTRAVENOUS

## 2022-06-03 MED ORDER — HYDRALAZINE HCL 20 MG/ML IJ SOLN
INTRAMUSCULAR | Status: AC
  Filled 2022-06-03: qty 1

## 2022-06-03 MED ORDER — ROCURONIUM BROMIDE 10 MG/ML (PF) SYRINGE
PREFILLED_SYRINGE | INTRAVENOUS | Status: AC
  Filled 2022-06-03: qty 10

## 2022-06-03 MED ORDER — LACTATED RINGERS IV SOLN: INTRAVENOUS | Status: DC

## 2022-06-03 MED ORDER — PROMETHAZINE HCL 25 MG/ML IJ SOLN: 6.2500 mg | INTRAMUSCULAR | Status: DC | PRN

## 2022-06-03 SURGICAL SUPPLY — 83 items
BAG COUNTER SPONGE SURGICOUNT (BAG) ×2 IMPLANT
BAG SPNG CNTER NS LX DISP (BAG) ×1
BANDAGE ESMARK 6X9 LF (GAUZE/BANDAGES/DRESSINGS) ×2 IMPLANT
BIT DRILL 3.3 LONG (BIT) IMPLANT
BIT DRILL QC 3.3X195 (BIT) IMPLANT
BLADE CLIPPER SURG (BLADE) IMPLANT
BLADE SURG 10 STRL SS (BLADE) ×2 IMPLANT
BLADE SURG 15 STRL LF DISP TIS (BLADE) ×2 IMPLANT
BLADE SURG 15 STRL SS (BLADE)
BNDG CMPR 9X6 STRL LF SNTH (GAUZE/BANDAGES/DRESSINGS)
BNDG COHESIVE 4X5 TAN STRL (GAUZE/BANDAGES/DRESSINGS) ×2 IMPLANT
BNDG ELASTIC 4X5.8 VLCR STR LF (GAUZE/BANDAGES/DRESSINGS) ×2 IMPLANT
BNDG ELASTIC 6X5.8 VLCR STR LF (GAUZE/BANDAGES/DRESSINGS) ×2 IMPLANT
BNDG ESMARK 6X9 LF (GAUZE/BANDAGES/DRESSINGS)
BNDG GAUZE DERMACEA FLUFF 4 (GAUZE/BANDAGES/DRESSINGS) ×2 IMPLANT
BNDG GZE DERMACEA 4 6PLY (GAUZE/BANDAGES/DRESSINGS)
BRUSH SCRUB EZ PLAIN DRY (MISCELLANEOUS) ×4 IMPLANT
CANISTER SUCT 3000ML PPV (MISCELLANEOUS) ×2 IMPLANT
CAP LOCK NCB (Cap) IMPLANT
COVER SURGICAL LIGHT HANDLE (MISCELLANEOUS) ×2 IMPLANT
CUFF TOURN SGL QUICK 34 (TOURNIQUET CUFF)
CUFF TRNQT CYL 34X4.125X (TOURNIQUET CUFF) ×2 IMPLANT
DRAPE C-ARM 42X72 X-RAY (DRAPES) ×2 IMPLANT
DRAPE C-ARMOR (DRAPES) ×2 IMPLANT
DRAPE HALF SHEET 40X57 (DRAPES) IMPLANT
DRAPE INCISE IOBAN 66X45 STRL (DRAPES) ×2 IMPLANT
DRAPE U-SHAPE 47X51 STRL (DRAPES) ×2 IMPLANT
DRSG ADAPTIC 3X8 NADH LF (GAUZE/BANDAGES/DRESSINGS) ×2 IMPLANT
DRSG MEPILEX POST OP 4X8 (GAUZE/BANDAGES/DRESSINGS) IMPLANT
ELECT REM PT RETURN 9FT ADLT (ELECTROSURGICAL) ×1
ELECTRODE REM PT RTRN 9FT ADLT (ELECTROSURGICAL) ×2 IMPLANT
GAUZE PAD ABD 8X10 STRL (GAUZE/BANDAGES/DRESSINGS) ×4 IMPLANT
GAUZE SPONGE 4X4 12PLY STRL (GAUZE/BANDAGES/DRESSINGS) ×2 IMPLANT
GLOVE BIO SURGEON STRL SZ7.5 (GLOVE) ×2 IMPLANT
GLOVE BIO SURGEON STRL SZ8 (GLOVE) ×2 IMPLANT
GLOVE BIOGEL PI IND STRL 7.5 (GLOVE) ×2 IMPLANT
GLOVE BIOGEL PI IND STRL 8 (GLOVE) ×2 IMPLANT
GLOVE SURG ORTHO LTX SZ7.5 (GLOVE) ×4 IMPLANT
GOWN STRL REUS W/ TWL LRG LVL3 (GOWN DISPOSABLE) ×4 IMPLANT
GOWN STRL REUS W/ TWL XL LVL3 (GOWN DISPOSABLE) ×2 IMPLANT
GOWN STRL REUS W/TWL LRG LVL3 (GOWN DISPOSABLE) ×2
GOWN STRL REUS W/TWL XL LVL3 (GOWN DISPOSABLE) ×1
IMMOBILIZER KNEE 22 UNIV (SOFTGOODS) ×2 IMPLANT
K-WIRE 2.0 (WIRE) ×1
K-WIRE FXSTD 280X2XNS SS (WIRE) ×1
KIT BASIN OR (CUSTOM PROCEDURE TRAY) ×2 IMPLANT
KIT TURNOVER KIT B (KITS) ×2 IMPLANT
KWIRE FXSTD 280X2XNS SS (WIRE) IMPLANT
MAT PREVALON FULL STRYKER (MISCELLANEOUS) IMPLANT
NDL SUT 6 .5 CRC .975X.05 MAYO (NEEDLE) IMPLANT
NEEDLE MAYO TAPER (NEEDLE)
NS IRRIG 1000ML POUR BTL (IV SOLUTION) ×2 IMPLANT
PACK ORTHO EXTREMITY (CUSTOM PROCEDURE TRAY) ×2 IMPLANT
PAD ARMBOARD 7.5X6 YLW CONV (MISCELLANEOUS) ×4 IMPLANT
PAD CAST 4YDX4 CTTN HI CHSV (CAST SUPPLIES) ×2 IMPLANT
PADDING CAST COTTON 4X4 STRL (CAST SUPPLIES)
PADDING CAST COTTON 6X4 STRL (CAST SUPPLIES) ×2 IMPLANT
PLATE NCB LAT PROX 3H TIBIA 7H (Plate) IMPLANT
SCREW NCB 4.0 32MM (Screw) IMPLANT
SCREW NCB 4.0MX30M (Screw) IMPLANT
SCREW NCB 4.0MX34M (Screw) IMPLANT
SCREW NCB 4.0X75 CORT S/T (Screw) IMPLANT
SCREW NCB PA ST 4X3.4X85 (Screw) IMPLANT
SCREW PROX ST NCB 4X80 (Screw) IMPLANT
SPONGE T-LAP 18X18 ~~LOC~~+RFID (SPONGE) ×2 IMPLANT
STAPLER VISISTAT 35W (STAPLE) ×2 IMPLANT
STOCKINETTE IMPERVIOUS LG (DRAPES) ×2 IMPLANT
SUCTION FRAZIER HANDLE 10FR (MISCELLANEOUS) ×1
SUCTION TUBE FRAZIER 10FR DISP (MISCELLANEOUS) ×2 IMPLANT
SUT ETHILON 2 0 FS 18 (SUTURE) IMPLANT
SUT PROLENE 0 CT 2 (SUTURE) ×4 IMPLANT
SUT VIC AB 0 CT1 27 (SUTURE) ×1
SUT VIC AB 0 CT1 27XBRD ANBCTR (SUTURE) ×2 IMPLANT
SUT VIC AB 1 CT1 27 (SUTURE) ×1
SUT VIC AB 1 CT1 27XBRD ANBCTR (SUTURE) ×2 IMPLANT
SUT VIC AB 2-0 CT1 27 (SUTURE) ×1
SUT VIC AB 2-0 CT1 TAPERPNT 27 (SUTURE) ×4 IMPLANT
TOWEL GREEN STERILE (TOWEL DISPOSABLE) ×4 IMPLANT
TOWEL GREEN STERILE FF (TOWEL DISPOSABLE) ×2 IMPLANT
TRAY FOLEY MTR SLVR 16FR STAT (SET/KITS/TRAYS/PACK) IMPLANT
TUBE CONNECTING 12X1/4 (SUCTIONS) ×2 IMPLANT
WATER STERILE IRR 1000ML POUR (IV SOLUTION) ×4 IMPLANT
YANKAUER SUCT BULB TIP NO VENT (SUCTIONS) ×2 IMPLANT

## 2022-06-03 NOTE — Care Management Important Message (Signed)
Important Message  Patient Details  Name: [name] [name] MRN: [id number] Date of Birth: Mar 08, 1951   Medicare Important Message Given:  Yes   Patient left prior to IM delivery will mail to the patient home address.    Rudi Knippenberg 06/03/2022, 3:22 PM

## 2022-06-03 NOTE — Op Note (Signed)
06/03/2022 5:45 PM  PATIENT:  [name] [name]  72 y.o. female [id number]  PRE-OPERATIVE DIAGNOSIS:   1. RIGHT BICONDYLAR TIBIAL PLATEAU FRACTURE  POST-OPERATIVE DIAGNOSIS:   1. RIGHT BICONDYLAR TIBIAL PLATEAU FRACTURE  PROCEDURE:  Procedure(s): 1. OPEN REDUCTION INTERNAL FIXATION (ORIF) BICONDYLAR TIBIAL PLATEAU RIGHT 2. ANTERIOR COMPARTMENT FASCIOTOMY  SURGEON:  Surgeon(s) and Role:    * [place], MD - Primary  PHYSICIAN ASSISTANT: Ainsley Spinner, PA-C  ANESTHESIA:   general  EBL:  50 mL   BLOOD ADMINISTERED:none  DRAINS: none   LOCAL MEDICATIONS USED:  NONE  SPECIMEN: None  DISPOSITION OF SPECIMEN:  N/A  COUNTS:  YES  TOURNIQUET:  * No tourniquets in log *  DICTATION: Written below.  PLAN OF CARE: Admit to inpatient   PATIENT DISPOSITION:  PACU - hemodynamically stable.   Delay start of Pharmacological VTE agent (>24hrs) due to surgical blood loss or risk of bleeding: no  BRIEF SUMMARY AND INDICATION FOR PROCEDURE:  Patient is a 72 y.o.-year- old with a tibial plateau fracture, treated provisionally with immediate immobilization, elevation, and active motion of the foot and toes to facilitate resolution of soft tissue swelling.  We did discuss with the patient the risks and benefits of surgical treatment including the potential for arthritis, nerve injury, vessel injury, loss of motion, DVT, PE, heart attack, stroke, symptomatic hardware, need for further surgery, and multiple others.  The patient acknowledged these risks and wished to proceed.   BRIEF SUMMARY OF PROCEDURE:  After administration of preoperative antibiotics with 3 gm Ancef, the patient was taken to the operating room.  General anesthesia was induced and the lower extremity prepped and draped in usual sterile fashion using a chlorhexidine wash and betadine scrub and paint. A timeout was performed. I then brought in the radiolucent triangle but switched to bone foam to assist with reduction. A short  curvilinear incision was made extending laterally over Gerdy's tubercle. Dissection was carried down where the soft tissues were left intact to the lateral plateau and rim, elevated only the insertion of the extensors. I was then able to insert the 9 hole Zimmer NCB tibial plateau plate and pin it proximally at the joint line. My assistant pulled traction and derotated the fracture. I then placed two screws proximally in the subchondral bone and four bicortical screws distally securing the alignment. I then converted this to locked fixation by placing locking caps proximally. Final images showed appropriate reduction, implant position and length. All wounds were irrigated thoroughly.   Prior to closure, I turned my attention to the distal edge of the wound here underneath the skin.  I used the long scissors to spread both superficial and deep to the anterior compartment.  The fascia was then released for 8 to 10 cm to reduce the likelihood of the postoperative compartment syndrome.  Once more, wound was irrigated and then a standard layered closure performed, 0 Vicryl, 2-0 Vicryl, and 3-0 nylon for the skin.  Sterile gently compressive dressing was applied and in knee immobilizer.  The patient was taken to the PACU in stable condition.   Ainsley Spinner, PA-C was present and assisting throughout with all portions of the procedure.   PROGNOSIS: The patient will not require formal bracing given the stable examination post fixation. Unrestricted range of motion will begin immediately.  She will be nonweightbearing on the operative extremity, be on pharmacologic DVT prophylaxis with weight based Lovenox, and mobilized with PT and OT. After discharge, we will plan to see  her back in about 2 weeks for removal of sutures. Discharge anticipated in two days.   Social work is already involved. 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 Sister and sister's family appear to be very supportive.         Astrid Divine. Marcelino Scot, M.D.

## 2022-06-03 NOTE — Progress Notes (Signed)
Received pt from PACU, pt moving slightly in bed and very drowsy. Pt is sating 100% on 2L [place]. Purewick placed and ice pack applied to RLE. There is some drainage on dressing, marked with black marker and will notify MD about.

## 2022-06-03 NOTE — Anesthesia Preprocedure Evaluation (Addendum)
Anesthesia Evaluation    Reviewed: Allergy & Precautions, Patient's Chart, lab work & pertinent test results  Airway Mallampati: II  TM Distance: >3 FB Neck ROM: Full    Dental no notable dental hx.    Pulmonary COPD, former smoker   Pulmonary exam normal breath sounds clear to auscultation       Cardiovascular hypertension, Normal cardiovascular exam Rhythm:Regular Rate:Normal     Neuro/Psych  PSYCHIATRIC DISORDERS Anxiety Depression Bipolar Disorder   negative neurological ROS     GI/Hepatic Neg liver ROS,GERD  ,,  Endo/Other  diabetes  Morbid obesity  Renal/GU negative Renal ROS     Musculoskeletal negative musculoskeletal ROS (+)    Abdominal   Peds  Hematology  (+) Blood dyscrasia, anemia   Anesthesia Other Findings   Reproductive/Obstetrics                             Anesthesia Physical Anesthesia Plan  ASA: 3  Anesthesia Plan: General   Post-op Pain Management: Tylenol PO (pre-op)* and Toradol IV (intra-op)*   Induction: Intravenous  PONV Risk Score and Plan: 4 or greater and Ondansetron, Dexamethasone and Diphenhydramine  Airway Management Planned: Oral ETT  Additional Equipment:   Intra-op Plan:   Post-operative Plan: Extubation in OR  Informed Consent:   Plan Discussed with: Anesthesiologist  Anesthesia Plan Comments:        Anesthesia Quick Evaluation

## 2022-06-03 NOTE — Anesthesia Procedure Notes (Signed)
Procedure Name: Intubation Date/Time: 06/03/2022 4:19 PM  Performed by: Dorthea Cove, CRNAPre-anesthesia Checklist: Patient identified, Emergency Drugs available, Suction available and Patient being monitored Patient Re-evaluated:Patient Re-evaluated prior to induction Oxygen Delivery Method: Circle system utilized Preoxygenation: Pre-oxygenation with 100% oxygen Induction Type: IV induction Ventilation: Mask ventilation without difficulty Laryngoscope Size: Mac and 3 Grade View: Grade I Tube type: Oral Tube size: 7.5 mm Number of attempts: 1 Airway Equipment and Method: Stylet and Oral airway Placement Confirmation: ETT inserted through vocal cords under direct vision, positive ETCO2 and breath sounds checked- equal and bilateral Secured at: 21 cm Tube secured with: Tape Dental Injury: Teeth and Oropharynx as per pre-operative assessment

## 2022-06-03 NOTE — Progress Notes (Signed)
PROGRESS NOTE    [name] [name]  NLZ:[id number] DOB: 20-Sep-1950 DOA: 05/30/2022 PCP: Aletha Halim., PA-C   Brief Narrative:  72 year old with history of frequent falls with frequent falls, COPD, HTN, HLD, DM 2, chronic right femur fracture since 2017, ambulatory dysfunction, diabetic neuropathy sustained a mechanical fall on the right knee.  CT and x-ray showed nondisplaced large transverse fracture metaphyseal region of the proximal tibia, femoral neck and chronic nonhealing femoral shaft fracture.  Ortho was consulted who recommended knee immobilizer and nonweightbearing of the right lower extremity.  PT/OT recommended SNF.  1/17: Vitals stable.  Due to excessive pain and difficulty participating with PT orthopedic surgery decided to take her to the OR on 06/03/2022. Vitamin D level low at 24-started on supplement Hypomagnesemia with magnesium of 1.6-ordered replacement  1/18: Patient remained stable.  Going to the OR for ORIF later today.  Hypomagnesemia has been resolved.  Assessment & Plan:  Principal Problem:   Periprosthetic fracture of shaft of femur Active Problems:   Acute blood loss anemia   Morbid obesity with BMI of 45.0-49.9, adult (HCC)   COPD (chronic obstructive pulmonary disease) (HCC)   Closed right tibial fracture   Right tibial metaphyseal and fibular neck fracture -Secondary to mechanical fall.  Orthopedic consulted, recommending conservative management.  Nonweightbearing on the right lower extremity.  Needs to wear knee immobilizer all the time PT consulted. -Going to the OR for ORIF today  Low vitamin D levels.  Found to be at 5. -Start her on an vitamin D supplement 1000 units daily  Hypomagnesemia..  Magnesium at 1.6 -Replace magnesium and monitor   Leukocytosis.  Resolved Reactive, no signs of infection.  Will monitor   IIDM2 Peripheral neuropathy -Glucose fairly controlled, continue metformin.  Add sliding scale and Accu-Cheks.  Continue  gabapentin   Acute blood loss anemia -Hemodynamically stable, hemoglobin at 10.4 today.   Nonhealing right femur fracture -Outpatient follow-up with orthopedic surgery   HTN -Slowly resume home medication.  IV as needed ordered   Morbid obesity -Patient not interested in calorie control  DVT prophylaxis: Lovenox Code Status: Full code Family Communication: Discussed with patient  Status is: Inpatient Will need placement   Subjective: Patient was resting comfortably in bed when seen today.  Pain seems well-controlled and waiting for surgery later today.  Examination:  General.  Obese lady, in no acute distress. Pulmonary.  Lungs clear bilaterally, normal respiratory effort. CV.  Regular rate and rhythm, no JVD, rub or murmur. Abdomen.  Soft, nontender, nondistended, BS positive. CNS.  Alert and oriented .  No focal neurologic deficit. Extremities.  No edema, no cyanosis, pulses intact and symmetrical.  Right knee with knee brace and foot with Ace wrap. Psychiatry.  Judgment and insight appears normal.     Objective: Vitals:   06/02/22 2040 06/03/22 0528 06/03/22 0800 06/03/22 1250  BP: (!) 130/107 (!) 151/77 (!) 149/80 138/81  Pulse: 91 76 76 80  Resp: '17 17 18 16  '$ Temp: 98 F (36.7 C) 98.7 F (37.1 C) 97.6 F (36.4 C)   TempSrc: Oral  Oral   SpO2: 97% 92% 94% 99%  Weight:      Height:        Intake/Output Summary (Last 24 hours) at 06/03/2022 1414 Last data filed at 06/03/2022 1229 Gross per 24 hour  Intake 120 ml  Output 1800 ml  Net -1680 ml    Filed Weights   05/30/22 1924  Weight: (!) 136.1 kg  Data Reviewed:   CBC: Recent Labs  Lab 05/30/22 2144 06/01/22 0706 06/02/22 0645 06/03/22 0404  WBC 14.0* 9.1 8.6 9.1  NEUTROABS 12.3*  --   --   --   HGB 12.8 9.6* 10.4* 10.5*  HCT 40.5 30.8* 33.9* 33.5*  MCV 92.5 94.2 95.0 92.5  PLT 357 262 272 353    Basic Metabolic Panel: Recent Labs  Lab 05/30/22 2144 06/01/22 0706 06/02/22 0645  06/03/22 0404  NA 141  --  138 137  K 3.7 3.9 4.2 4.0  CL 103  --  102 102  CO2 27  --  29 28  GLUCOSE 116*  --  105* 114*  BUN 15  --  16 15  CREATININE 0.83  --  0.97 0.94  CALCIUM 10.5*  --  9.4 9.2  MG  --   --  1.6* 2.0    GFR: Estimated Creatinine Clearance: 69.9 mL/min (by C-G formula based on SCr of 0.94 mg/dL). Liver Function Tests: Recent Labs  Lab 05/30/22 2144  AST 15  ALT 12  ALKPHOS 70  BILITOT 0.6  PROT 7.8  ALBUMIN 4.2    No results for input(s): "LIPASE", "AMYLASE" in the last 168 hours. No results for input(s): "AMMONIA" in the last 168 hours. Coagulation Profile: No results for input(s): "INR", "PROTIME" in the last 168 hours. Cardiac Enzymes: No results for input(s): "CKTOTAL", "CKMB", "CKMBINDEX", "TROPONINI" in the last 168 hours. BNP (last 3 results) No results for input(s): "PROBNP" in the last 8760 hours. HbA1C: No results for input(s): "HGBA1C" in the last 72 hours. CBG: Recent Labs  Lab 06/02/22 1145 06/02/22 1530 06/02/22 2047 06/03/22 0759 06/03/22 1219  GLUCAP 116* 106* 131* 113* 108*    Lipid Profile: No results for input(s): "CHOL", "HDL", "LDLCALC", "TRIG", "CHOLHDL", "LDLDIRECT" in the last 72 hours. Thyroid Function Tests: No results for input(s): "TSH", "T4TOTAL", "FREET4", "T3FREE", "THYROIDAB" in the last 72 hours. Anemia Panel: No results for input(s): "VITAMINB12", "FOLATE", "FERRITIN", "TIBC", "IRON", "RETICCTPCT" in the last 72 hours. Sepsis Labs: No results for input(s): "PROCALCITON", "LATICACIDVEN" in the last 168 hours.  Recent Results (from the past 240 hour(s))  Surgical PCR screen     Status: None   Collection Time: 06/02/22  9:00 PM   Specimen: Nasal Mucosa; Nasal Swab  Result Value Ref Range Status   MRSA, PCR NEGATIVE NEGATIVE Final   Staphylococcus aureus NEGATIVE NEGATIVE Final    Comment: (NOTE) The Xpert SA Assay (FDA approved for NASAL specimens in patients 33 years of age and older), is one  component of a comprehensive surveillance program. It is not intended to diagnose infection nor to guide or monitor treatment. Performed at [facility], [place] [address], [place], [place] [address]          Radiology Studies: No results found.      Scheduled Meds:  [MAR Hold] acetaminophen  1,000 mg Oral Q4H   [MAR Hold] aspirin EC  81 mg Oral Daily   [MAR Hold] cholecalciferol  1,000 Units Oral Daily   [MAR Hold] docusate sodium  100 mg Oral BID   [MAR Hold] enoxaparin (LOVENOX) injection  0.5 mg/kg Subcutaneous Q24H   [MAR Hold] ferrous sulfate  650 mg Oral Daily   [MAR Hold] gabapentin  1,200 mg Oral QHS   [MAR Hold] gabapentin  600 mg Oral Daily   [MAR Hold] insulin aspart  0-5 Units Subcutaneous QHS   [MAR Hold] insulin aspart  0-9 Units Subcutaneous TID WC   [  MAR Hold] metFORMIN  500 mg Oral BID WC   [MAR Hold] mupirocin ointment  1 Application Nasal BID   [MAR Hold] pantoprazole  40 mg Oral Daily   Continuous Infusions:  [MAR Hold]  ceFAZolin (ANCEF) IV     lactated ringers       LOS: 3 days   Time spent= 37 mins  This record has been created using Systems analyst. Errors have been sought and corrected,but may not always be located. Such creation errors do not reflect on the standard of care.   Lorella Nimrod, MD [facility]  If 7PM-7AM, please contact night-coverage  06/03/2022, 2:14 PM

## 2022-06-03 NOTE — Progress Notes (Signed)
Orthopaedic trauma Service  Nursing note from yesterday noted I spent no less than 30 minutes with patient discussing [name] in detail, including the fact that we planned to address her fracture with a minimally invasive approach utilizing a plate and screws, we discussed that addressing her R tibia surgically should make for easier transfers and gives her the best opportunity to heal this fracture. We also discussed continued observation of her R femur and that addressing that would be a much longer and invasive surgery, pt remains asymptomatic with respect to her R femur.  Pts sister was on the phone as well.  I also reviewed [name] while nurse Pohle manager was in room   We can fill consent down while in pre-op holding.  This should not delay the pt coming down to the pre-op holding area when we call for her   [name], Hershal Coria [phone] (C) 06/03/2022, 8:41 AM  [facility] [place] [address] [phone] (F)       Patient ID: [name] [name], female   DOB: July 02, 1950, 72 y.o.   MRN: [id number]

## 2022-06-03 NOTE — Transfer of Care (Signed)
Immediate Anesthesia Transfer of Care Note  Patient: [name] [name]  Procedure(s) Performed: OPEN REDUCTION INTERNAL FIXATION (ORIF) OF RIGHT TIBIA (Right)  Patient Location: PACU  Anesthesia Type:General  Level of Consciousness: awake and patient cooperative  Airway & Oxygen Therapy: Patient Spontanous Breathing and Patient connected to nasal cannula oxygen  Post-op Assessment: Report given to RN and Post -op Vital signs reviewed and stable  Post vital signs: Reviewed and stable  Last Vitals:  Vitals Value Taken Time  BP 202/87 06/03/22 1750  Temp 36.5 C 06/03/22 1750  Pulse 75 06/03/22 1755  Resp 11 06/03/22 1755  SpO2 92 % 06/03/22 1755  Vitals shown include unvalidated device data.  Last Pain:  Vitals:   06/03/22 1250  TempSrc:   PainSc: 0-No pain         Complications: No notable events documented.

## 2022-06-04 ENCOUNTER — Encounter (HOSPITAL_COMMUNITY): Payer: Self-pay | Admitting: Orthopedic Surgery

## 2022-06-04 LAB — MAGNESIUM: Magnesium: 1.9 mg/dL (ref 1.7–2.4)

## 2022-06-04 LAB — CBC
HCT: 33.6 % — ABNORMAL LOW (ref 36.0–46.0)
Hemoglobin: 10.9 g/dL — ABNORMAL LOW (ref 12.0–15.0)
MCH: 29.2 pg (ref 26.0–34.0)
MCHC: 32.4 g/dL (ref 30.0–36.0)
MCV: 90.1 fL (ref 80.0–100.0)
Platelets: 361 10*3/uL (ref 150–400)
RBC: 3.73 MIL/uL — ABNORMAL LOW (ref 3.87–5.11)
RDW: 14.3 % (ref 11.5–15.5)
WBC: 10.5 10*3/uL (ref 4.0–10.5)
nRBC: 0 % (ref 0.0–0.2)

## 2022-06-04 LAB — BASIC METABOLIC PANEL
Anion gap: 12 (ref 5–15)
BUN: 14 mg/dL (ref 8–23)
CO2: 28 mmol/L (ref 22–32)
Calcium: 9.3 mg/dL (ref 8.9–10.3)
Chloride: 96 mmol/L — ABNORMAL LOW (ref 98–111)
Creatinine, Ser: 0.89 mg/dL (ref 0.44–1.00)
GFR, Estimated: >60 mL/min (ref >60)
Glucose, Bld: 150 mg/dL — ABNORMAL HIGH (ref 70–99)
Potassium: 4.8 mmol/L (ref 3.5–5.1)
Sodium: 136 mmol/L (ref 135–145)

## 2022-06-04 LAB — GLUCOSE, CAPILLARY
Glucose-Capillary: 153 mg/dL — ABNORMAL HIGH (ref 70–99)
Glucose-Capillary: 123 mg/dL — ABNORMAL HIGH (ref 70–99)
Glucose-Capillary: 142 mg/dL — ABNORMAL HIGH (ref 70–99)
Glucose-Capillary: 128 mg/dL — ABNORMAL HIGH (ref 70–99)

## 2022-06-04 MED ORDER — POLYETHYLENE GLYCOL 3350 17 G PO PACK
17.0000 g | PACK | Freq: Every day | ORAL | Status: DC
  Administered 2022-06-05 – 2022-06-07 (×3): 17 g via ORAL
  Filled 2022-06-04 (×3): qty 1

## 2022-06-04 NOTE — Evaluation (Signed)
Occupational Therapy Evaluation Patient Details Name: [name] [name] MRN: [id number] DOB: May 03, 1951 Today's Date: 06/04/2022   History of Present Illness 72 yo female admitted 1/14 after stepping in hole with Rt femur fx, non-op, NWB. s/p ORIF right tibial plateau and anterior compartment fasciotomy 06/03/22. PMhx: chronic non-healing Rt femur fx, obesity, COPD, HTN, HLD, DM, neuropathy, bipolar   Clinical Impression   Prior to this admission, patient living alone, driving, and transferring to a wheelchair to move around the house. Patient reports she can complete ADLs with increased time, however does not typically wear shoes that require increased effort. Currently, patient is a max of 2 to complete lateral scoot to recliner to adhere to R NWB status. Patient requiring cues throughout to maintain NWB status but demonstrating good effort to complete lateral scoot. OT recommending SNF placement to allow patient appropriate time to complete rehab due to prior level of function. OT will continue to follow.      Recommendations for follow up therapy are one component of a multi-disciplinary discharge planning process, led by the attending physician.  Recommendations may be updated based on patient status, additional functional criteria and insurance authorization.   Follow Up Recommendations  Skilled nursing-short term rehab (<3 hours/day)     Assistance Recommended at Discharge Intermittent Supervision/Assistance  Patient can return home with the following Two people to help with walking and/or transfers;A lot of help with bathing/dressing/bathroom;Assist for transportation;Help with stairs or ramp for entrance;Assistance with cooking/housework    Functional Status Assessment  Patient has had a recent decline in their functional status and demonstrates the ability to make significant improvements in function in a reasonable and predictable amount of time.  Equipment Recommendations  None  recommended by OT (Defer to next venue)    Recommendations for Other Services       Precautions / Restrictions Precautions Precautions: Fall Required Braces or Orthoses: Knee Immobilizer - Right Knee Immobilizer - Right: On at all times Restrictions Weight Bearing Restrictions: Yes RLE Weight Bearing: Non weight bearing      Mobility Bed Mobility Overal bed mobility: Needs Assistance Bed Mobility: Supine to Sit     Supine to sit: +2 for physical assistance, HOB elevated, Mod assist     General bed mobility comments: Assist with RLE to get EOB as well as scooting bottom and to elevate trunk with heavy use of rail. Increased time.    Transfers Overall transfer level: Needs assistance   Transfers: Bed to chair/wheelchair/BSC            Lateral/Scoot Transfers: Max assist, +2 physical assistance General transfer comment: Assist of 2 to laterally scoot along side bed and into chair using pad, assist for anterior weight shift to scoot and to extend RLE to maintain NWB, increased time.      Balance Overall balance assessment: Needs assistance Sitting-balance support: Feet supported, Single extremity supported Sitting balance-Leahy Scale: Fair Sitting balance - Comments: Supervision for safety.       Standing balance comment: Unable                           ADL either performed or assessed with clinical judgement   ADL Overall ADL's : Needs assistance/impaired Eating/Feeding: Set up;Sitting   Grooming: Set up;Sitting   Upper Body Bathing: Minimal assistance;Sitting   Lower Body Bathing: Maximal assistance;Total assistance;Sitting/lateral leans;Sit to/from stand   Upper Body Dressing : Minimal assistance;Sitting   Lower Body Dressing: Maximal assistance;Total assistance;Sit to/from  stand;Sitting/lateral leans   Toilet Transfer: Maximal assistance;+2 for physical assistance;+2 for safety/equipment Toilet Transfer Details (indicate cue type and  reason): lateral scoot to drop arm recliner Toileting- Clothing Manipulation and Hygiene: Total assistance;Bed level       Functional mobility during ADLs: Maximal assistance;+2 for physical assistance;+2 for safety/equipment General ADL Comments: Patient presenting with pain in R leg, need for increased assist to scoot to recliner, and increased assist for all ADL management.     Vision Baseline Vision/History: 1 Wears glasses Ability to See in Adequate Light: 0 Adequate Patient Visual Report: No change from baseline Vision Assessment?: No apparent visual deficits     Perception     Praxis      Pertinent Vitals/Pain Pain Assessment Pain Assessment: Faces Faces Pain Scale: Hurts even more Pain Location: RLE with movement Pain Descriptors / Indicators: Sore, Operative site guarding, Grimacing Pain Intervention(s): Limited activity within patient's tolerance, Monitored during session, Repositioned     Hand Dominance     Extremity/Trunk Assessment Upper Extremity Assessment Upper Extremity Assessment: Generalized weakness   Lower Extremity Assessment Lower Extremity Assessment: Defer to PT evaluation   Cervical / Trunk Assessment Cervical / Trunk Assessment: Other exceptions Cervical / Trunk Exceptions: increased body habitus   Communication Communication Communication: No difficulties   Cognition Arousal/Alertness: Awake/alert Behavior During Therapy: WFL for tasks assessed/performed Overall Cognitive Status: Within Functional Limits for tasks assessed                                 General Comments: seems appropriate for functional mobility today, needs repetition of cues at times. Tangential but redirectable.     General Comments  Some weeping through bandage on RLE    Exercises     Shoulder Instructions      Home Living Family/patient expects to be discharged to:: Skilled nursing facility Living Arrangements: Alone   Type of Home: Mobile  home Home Access: Ramped entrance     Home Layout: One level     Bathroom Shower/Tub: Teacher, early years/pre: Standard     Home Equipment: Conservation officer, nature (2 wheels);Wheelchair - manual;Tub bench;BSC/3in1          Prior Functioning/Environment Prior Level of Function : Needs assist;Driving             Mobility Comments: walks in bathroom since not WC accessible, uses WC otherwise ADLs Comments: reports she does not clean, picks up food or microwaves. States she performs aDLs but also stating she hasn't changed her shirt for 3 weeks        OT Problem List: Decreased strength;Decreased activity tolerance;Impaired balance (sitting and/or standing);Decreased coordination;Decreased safety awareness;Decreased knowledge of use of DME or AE;Pain;Obesity      OT Treatment/Interventions: Self-care/ADL training;Therapeutic exercise;Energy conservation;DME and/or AE instruction;Manual therapy;Therapeutic activities;Patient/family education;Balance training    OT Goals(Current goals can be found in the care plan section) Acute Rehab OT Goals Patient Stated Goal: to get better OT Goal Formulation: With patient Time For Goal Achievement: 06/18/22 Potential to Achieve Goals: Good ADL Goals Pt Will Perform Lower Body Bathing: with mod assist;sitting/lateral leans;with adaptive equipment Pt Will Perform Lower Body Dressing: with mod assist;sitting/lateral leans;with adaptive equipment Pt Will Transfer to Toilet: with mod assist;with transfer board;bedside commode Pt Will Perform Toileting - Clothing Manipulation and hygiene: with mod assist;sitting/lateral leans;with adaptive equipment Pt/caregiver will Perform Home Exercise Program: Increased strength;Both right and left upper extremity;With theraband;With written HEP  provided;Independently  OT Frequency: Min 2X/week    Co-evaluation   Reason for Co-Treatment: Complexity of the patient's impairments (multi-system  involvement);For patient/therapist safety;To address functional/ADL transfers PT goals addressed during session: Mobility/safety with mobility;Balance;Strengthening/ROM OT goals addressed during session: ADL's and self-care;Strengthening/ROM      AM-PAC OT "6 Clicks" Daily Activity     Outcome Measure Help from another person eating meals?: A Little Help from another person taking care of personal grooming?: A Little Help from another person toileting, which includes using toliet, bedpan, or urinal?: A Lot Help from another person bathing (including washing, rinsing, drying)?: A Lot Help from another person to put on and taking off regular upper body clothing?: A Little Help from another person to put on and taking off regular lower body clothing?: A Lot 6 Click Score: 15   End of Session Nurse Communication: Mobility status;Need for lift equipment (use lift to return to bed)  Activity Tolerance: Patient tolerated treatment well Patient left: in bed;with call bell/phone within reach;with chair alarm set  OT Visit Diagnosis: Unsteadiness on feet (R26.81);Other abnormalities of gait and mobility (R26.89);Repeated falls (R29.6);Muscle weakness (generalized) (M62.81);History of falling (Z91.81);Pain Pain - Right/Left: Right Pain - part of body: Leg                Time: 1002-1030 OT Time Calculation (min): 28 min Charges:  OT General Charges $OT Visit: 1 Visit OT Evaluation $OT Eval Moderate Complexity: 1 Mod  Corinne Ports E. Augusten Lipkin, OTR/L Acute Rehabilitation Services [phone]   Ascencion Dike 06/04/2022, 1:05 PM

## 2022-06-04 NOTE — Progress Notes (Signed)
Orthopaedic Trauma Service Progress Note  Patient ID: [name] [name] MRN: [id number] DOB/AGE: [birth year] 72 y.o.  Subjective:  No acute ortho issues Pain tolerable post op    ROS As above  Objective:   VITALS:   Vitals:   06/03/22 1856 06/03/22 2058 06/04/22 0557 06/04/22 0759  BP: (!) 160/70 (!) 154/67 (!) 152/74 129/70  Pulse:  82 74 72  Resp:  '17 17 18  '$ Temp:  98.7 F (37.1 C) 98 F (36.7 C) 98 F (36.7 C)  TempSrc:    Oral  SpO2:  96% 97% 95%  Weight:      Height:        Estimated body mass index is 56.69 kg/m as calculated from the following:   Height as of this encounter: '5\' 1"'$  (1.549 m).   Weight as of this encounter: 136.1 kg.   Intake/Output      01/18 0701 01/19 0700 01/19 0701 01/20 0700   P.O. 240    I.V. (mL/kg) 1500 (11)    Total Intake(mL/kg) 1740 (12.8)    Urine (mL/kg/hr) 1150 (0.4)    Blood 50    Total Output 1200    Net +540           LABS  Results for orders placed or performed during the hospital encounter of 05/30/22 (from the past 24 hour(s))  Glucose, capillary     Status: Abnormal   Collection Time: 06/03/22 12:19 PM  Result Value Ref Range   Glucose-Capillary 108 (H) 70 - 99 mg/dL  Glucose, capillary     Status: Abnormal   Collection Time: 06/03/22  3:31 PM  Result Value Ref Range   Glucose-Capillary 100 (H) 70 - 99 mg/dL   Comment 1 Notify RN    Comment 2 Document in Chart   Glucose, capillary     Status: Abnormal   Collection Time: 06/03/22  5:54 PM  Result Value Ref Range   Glucose-Capillary 102 (H) 70 - 99 mg/dL  Glucose, capillary     Status: Abnormal   Collection Time: 06/03/22  8:34 PM  Result Value Ref Range   Glucose-Capillary 152 (H) 70 - 99 mg/dL  Basic metabolic panel     Status: Abnormal   Collection Time: 06/04/22  6:17 AM  Result Value Ref Range   Sodium 136 135 - 145 mmol/L   Potassium 4.8 3.5 - 5.1 mmol/L   Chloride 96 (L)  98 - 111 mmol/L   CO2 28 22 - 32 mmol/L   Glucose, Bld 150 (H) 70 - 99 mg/dL   BUN 14 8 - 23 mg/dL   Creatinine, Ser 0.89 0.44 - 1.00 mg/dL   Calcium 9.3 8.9 - 10.3 mg/dL   GFR, Estimated >60 >60 mL/min   Anion gap 12 5 - 15  Magnesium     Status: None   Collection Time: 06/04/22  6:17 AM  Result Value Ref Range   Magnesium 1.9 1.7 - 2.4 mg/dL  CBC     Status: Abnormal   Collection Time: 06/04/22  6:17 AM  Result Value Ref Range   WBC 10.5 4.0 - 10.5 K/uL   RBC 3.73 (L) 3.87 - 5.11 MIL/uL   Hemoglobin 10.9 (L) 12.0 - 15.0 g/dL   HCT 33.6 (L) 36.0 - 46.0 %   MCV 90.1 80.0 -  100.0 fL   MCH 29.2 26.0 - 34.0 pg   MCHC 32.4 30.0 - 36.0 g/dL   RDW 14.3 11.5 - 15.5 %   Platelets 361 150 - 400 K/uL   nRBC 0.0 0.0 - 0.2 %  Glucose, capillary     Status: Abnormal   Collection Time: 06/04/22  8:01 AM  Result Value Ref Range   Glucose-Capillary 153 (H) 70 - 99 mg/dL  Glucose, capillary     Status: Abnormal   Collection Time: 06/04/22 11:40 AM  Result Value Ref Range   Glucose-Capillary 123 (H) 70 - 99 mg/dL     PHYSICAL EXAM:    Gen: sitting up in bed, pleasant  Ext:       R Lower extremity               Moderate swelling R lower leg and ecchymosis noted, stable  Dressing with moderate drainage but stable             TTP proximal R tibia              + DP pulse             No DCT              Compartments soft              Distal motor and sensory functions at baseline and appear grossly intact    Assessment/Plan: 1 Day Post-Op   Principal Problem:   Periprosthetic fracture of shaft of femur Active Problems:   Acute blood loss anemia   Morbid obesity with BMI of 45.0-49.9, adult (HCC)   COPD (chronic obstructive pulmonary disease) (HCC)   Closed right tibial fracture   Anti-infectives (From admission, onward)    Start     Dose/Rate Route Frequency Ordered Stop   06/03/22 2200  ceFAZolin (ANCEF) IVPB 2g/100 mL premix        2 g 200 mL/hr over 30 Minutes  Intravenous Every 8 hours 06/03/22 1847 06/04/22 2159   06/03/22 1400  ceFAZolin (ANCEF) IVPB 3g/100 mL premix        3 g 200 mL/hr over 30 Minutes Intravenous  Once 06/02/22 0938 06/03/22 1629     .  POD/HD#: 1   -R proximal tibia fracture s/p ORIF  Weightbearing NWB R leg x 6 weeks   ROM/Activity   Unrestricted ROM R knee and ankle   Wound care   Daily wound care as needed starting tomorrow. New mepilex   Change as needed   Knee High TED   PT/OT evals  TOC consult  Home situation sounds difficult   Will likely need SNF   - R proximal femur nonunion. Asymptomatic and with stable hardware  Monitor   - Pain management:  Multimodal   - ABL anemia/Hemodynamics  Stable  - Medical issues   Per primary   - DVT/PE prophylaxis:  Weightbased lovenox    Recommend lovenox x4 weeks  - ID:   Periop abx   - Metabolic Bone Disease:  Vitamin d insufficiency    Supplement - Activity:  As above  - Dispo:  Therapy evals  Likely ready for SNF Monday     [name], PA-C [phone] (C) 06/04/2022, 12:13 PM  [facility] [address] [place] [address] [phone] [name] (F)    After 5pm and on the weekends please log on to Amion, go to orthopaedics and the look under the [facility]  Call for the provider(s) on call. You can also call our office at [phone] and then follow the prompts to be connected to the call team.  Patient ID: [name] [name], female   DOB: 1950/12/11, 72 y.o.   MRN: [id number]

## 2022-06-04 NOTE — Progress Notes (Signed)
Physical Therapy Treatment Patient Details Name: [name] [name] MRN: [id number] DOB: 07-14-1950 Today's Date: 06/04/2022   History of Present Illness 72 yo female admitted 1/14 after stepping in hole with Rt femur fx, non-op, NWB. s/p ORIF right tibial plateau and anterior compartment fasciotomy 06/03/22. PMhx: chronic non-healing Rt femur fx, obesity, COPD, HTN, HLD, DM, neuropathy, bipolar    PT Comments    Patient progressing well towards PT goals. Pt s/p ORIF of right tibia 1/18 and presents with post surgical deficits and pain of RLE. Requires assist of 2 for bed mobility and RLE management and tolerated lateral scoot transfer to chair with Max A of 2 with pt able to actively participate in scooting using pads. Cues for anterior weight shift and to maintain NWB RLE. Limited knee flexion AROM secondary to pain. Tolerated there ex. Recommend maximove lift back to bed for nursing especially if pt too fatigued to assist with lateral scoot transfer. Continues to be appropriate for SNF. Will follow.     Recommendations for follow up therapy are one component of a multi-disciplinary discharge planning process, led by the attending physician.  Recommendations may be updated based on patient status, additional functional criteria and insurance authorization.  Follow Up Recommendations  Skilled nursing-short term rehab (<3 hours/day) Can patient physically be transported by private vehicle: No   Assistance Recommended at Discharge Frequent or constant Supervision/Assistance  Patient can return home with the following Two people to help with walking and/or transfers;A lot of help with bathing/dressing/bathroom;Assistance with cooking/housework;Direct supervision/assist for medications management;Assist for transportation;Assistance with feeding;Help with stairs or ramp for entrance   Equipment Recommendations  Hospital bed    Recommendations for Other Services       Precautions / Restrictions  Precautions Precautions: Fall Required Braces or Orthoses: Knee Immobilizer - Right Knee Immobilizer - Right: On at all times Restrictions Weight Bearing Restrictions: Yes RLE Weight Bearing: Non weight bearing     Mobility  Bed Mobility Overal bed mobility: Needs Assistance Bed Mobility: Supine to Sit     Supine to sit: +2 for physical assistance, HOB elevated, Mod assist     General bed mobility comments: Assist with RLE to get EOB as well as scooting bottom and to elevate trunk with heavy use of rail. Increased time.    Transfers Overall transfer level: Needs assistance   Transfers: Bed to chair/wheelchair/BSC            Lateral/Scoot Transfers: Max assist, +2 physical assistance General transfer comment: Assist of 2 to laterally scoot along side bed and into chair using pad, assist for anterior weight shift to scoot and to extend RLE to maintain NWB, increased time.    Ambulation/Gait               General Gait Details: Unable   Stairs             Wheelchair Mobility    Modified Rankin (Stroke Patients Only)       Balance Overall balance assessment: Needs assistance Sitting-balance support: Feet supported, Single extremity supported Sitting balance-Leahy Scale: Fair Sitting balance - Comments: Supervision for safety.       Standing balance comment: Unable                            Cognition Arousal/Alertness: Awake/alert Behavior During Therapy: WFL for tasks assessed/performed Overall Cognitive Status: Within Functional Limits for tasks assessed  General Comments: seems appropriate for functional mobility today, needs repetition of cues at times. Tangential but redirectable.        Exercises General Exercises - Lower Extremity Ankle Circles/Pumps: AROM, Both, 10 reps, Supine Long Arc Quad: AAROM, Right, 5 reps, Seated Toe Raises: AROM, Both, 5 reps, Seated Heel Raises:  AROM, Both, 5 reps, Seated    General Comments General comments (skin integrity, edema, etc.): Some weeping through bandage on RLE      Pertinent Vitals/Pain Pain Assessment Pain Assessment: Faces Faces Pain Scale: Hurts even more Pain Location: RLE with movement Pain Descriptors / Indicators: Sore, Operative site guarding, Grimacing Pain Intervention(s): Monitored during session, Repositioned, Premedicated before session, Ice applied    Home Living                          Prior Function            PT Goals (current goals can now be found in the care plan section) Progress towards PT goals: Progressing toward goals    Frequency    Min 3X/week      PT Plan Current plan remains appropriate;Frequency needs to be updated    Co-evaluation PT/OT/SLP Co-Evaluation/Treatment: Yes Reason for Co-Treatment: For patient/therapist safety;To address functional/ADL transfers PT goals addressed during session: Mobility/safety with mobility;Balance;Strengthening/ROM        AM-PAC PT "6 Clicks" Mobility   Outcome Measure  Help needed turning from your back to your side while in a flat bed without using bedrails?: A Lot Help needed moving from lying on your back to sitting on the side of a flat bed without using bedrails?: A Lot Help needed moving to and from a bed to a chair (including a wheelchair)?: Total Help needed standing up from a chair using your arms (e.g., wheelchair or bedside chair)?: Total Help needed to walk in hospital room?: Total Help needed climbing 3-5 steps with a railing? : Total 6 Click Score: 8    End of Session Equipment Utilized During Treatment: Gait belt Activity Tolerance: Patient tolerated treatment well Patient left: in chair;with call bell/phone within reach;with chair alarm set Nurse Communication: Mobility status;Need for lift equipment PT Visit Diagnosis: Other abnormalities of gait and mobility (R26.89);Muscle weakness  (generalized) (M62.81);Repeated falls (R29.6);Pain Pain - Right/Left: Right Pain - part of body: Leg     Time: 1003-1031 PT Time Calculation (min) (ACUTE ONLY): 28 min  Charges:  $Therapeutic Activity: 8-22 mins                     Marisa Severin, PT, DPT Acute Rehabilitation Services Secure chat preferred Office [phone]      [name] [name] A Sabra Heck 06/04/2022, 12:33 PM

## 2022-06-04 NOTE — Progress Notes (Signed)
   06/04/22 1700  Spiritual Encounters  Type of Visit Initial  Care provided to: Patient  Referral source Patient request  Reason for visit Advance directives  OnCall Visit No  Spiritual Framework  Presenting Themes Goals in life/care  Community/Connection Friend(s)  Patient Stress Factors Health changes  Interventions  Spiritual Care Interventions Made Reflective listening;Normalization of emotions  Intervention Outcomes  Outcomes Awareness of health  [place] Issues Still Outstanding Referring to oncoming chaplain for further support   [facility] assist with Advance Directive. Pt has AD form. She will fill it out tomorrow. On call chaplain will follow up.

## 2022-06-04 NOTE — Progress Notes (Signed)
Patient has a moderate amount of drainage from R knee dressing. It is beyond the area that was marked on dayshift 1/18. Patient is stable at this time and no issues.

## 2022-06-04 NOTE — Anesthesia Postprocedure Evaluation (Signed)
Anesthesia Post Note  Patient: [name] [name]  Procedure(s) Performed: OPEN REDUCTION INTERNAL FIXATION (ORIF) OF RIGHT TIBIA (Right)     Patient location during evaluation: PACU Anesthesia Type: General Level of consciousness: awake and alert Pain management: pain level controlled Vital Signs Assessment: post-procedure vital signs reviewed and stable Respiratory status: spontaneous breathing, nonlabored ventilation, respiratory function stable and patient connected to nasal cannula oxygen Cardiovascular status: blood pressure returned to baseline and stable Postop Assessment: no apparent nausea or vomiting Anesthetic complications: no  No notable events documented.  Last Vitals:  Vitals:   06/04/22 0557 06/04/22 0759  BP: (!) 152/74 129/70  Pulse: 74 72  Resp: 17 18  Temp: 36.7 C 36.7 C  SpO2: 97% 95%    Last Pain:  Vitals:   06/04/22 0759  TempSrc: Oral  PainSc: 2                  Nel Stoneking S

## 2022-06-04 NOTE — Plan of Care (Signed)

## 2022-06-04 NOTE — TOC Progression Note (Addendum)
Transition of Care [facility]) - Progression Note    Patient Details  Name: [name] [name] MRN: [id number] Date of Birth: 1950-12-25  Transition of Care [facility]) CM/SW [place], RN Phone Number: 06/04/2022, 12:30 PM  Clinical Narrative:    CM met with the patient at the bedside to discuss SNF placement and explained to the patient that she would be likely be clear for admission to the facility by Monday, 06/07/22.  [facility] does not have an open bed but [facility] will have availability and the facility will start insurance authorization today once therapy notes are available in the hub.  Therapy is aware and documenting notes this morning.  CM will continue to follow the patient for potential admission to [facility] this first of the week.  06/04/2022 1617 - CM spoke with [facility] and the facility was unable to start insurance authorization since the insurance was not in network.  No bed offers are available at this time.  I called and spoke with the patient by phone and she has requested [facility] - I called [facility] and spoke with Soy, admission liaison and she will review patient's clinicals and insurance for available bed offer.  I called and insurance provider to obtain list of available SNF facilities in network.   Expected Discharge Plan: [place] Barriers to Discharge: Continued Medical Work up, SNF Pending bed offer  Expected Discharge Plan and Services   Discharge Planning Services: CM Consult Post Acute Care Choice: [place] arrangements for the past 2 months: Mobile Home                                       Social Determinants of Health (SDOH) Interventions SDOH Screenings   Food Insecurity: Food Insecurity Present (05/31/2022)  Housing: Low Risk  (05/31/2022)  Transportation Needs: Unmet Transportation Needs (05/31/2022)  Utilities: At Risk (05/31/2022)  Tobacco Use:  Medium Risk (06/03/2022)    Readmission Risk Interventions    06/01/2022   12:03 PM  Readmission Risk Prevention Plan  Post Dischage Appt Complete  Medication Screening Complete  Transportation Screening Complete

## 2022-06-04 NOTE — Progress Notes (Signed)
PROGRESS NOTE    [name] [name]  GEZ:[id number] DOB: Dec 10, 1950 DOA: 05/30/2022 PCP: Aletha Halim., PA-C   Brief Narrative:  72 year old with history of frequent falls with frequent falls, COPD, HTN, HLD, DM 2, chronic right femur fracture since 2017, ambulatory dysfunction, diabetic neuropathy sustained a mechanical fall on the right knee.  CT and x-ray showed nondisplaced large transverse fracture metaphyseal region of the proximal tibia, femoral neck and chronic nonhealing femoral shaft fracture.  Ortho was consulted who recommended knee immobilizer and nonweightbearing of the right lower extremity.  PT/OT recommended SNF.  1/17: Vitals stable.  Due to excessive pain and difficulty participating with PT orthopedic surgery decided to take her to the OR on 06/03/2022. Vitamin D level low at 24-started on supplement Hypomagnesemia with magnesium of 1.6-ordered replacement  1/18: Patient remained stable. ORIF today  1/19: feeling fine after surgery yesterday. TOC working on SNF  Assessment & Plan:  Principal Problem:   Periprosthetic fracture of shaft of femur Active Problems:   Acute blood loss anemia   Morbid obesity with BMI of 45.0-49.9, adult (HCC)   COPD (chronic obstructive pulmonary disease) (HCC)   Closed right tibial fracture   Right tibial metaphyseal and fibular neck fracture -Secondary to mechanical fall.  Orthopedic consulted, ORIF performed on 1/18. PT/OT advising snf. Lovenox for 4 wks advised. Per TOC working on SNF placement.  Low vitamin D levels.  Found to be at 91. -Started her on an vitamin D supplement 1000 units daily   IIDM2 Peripheral neuropathy -Glucose fairly controlled, continue metformin.  cont sliding scale and Accu-Cheks.  Continue gabapentin   Acute blood loss anemia -Hemodynamically stable, hemoglobin stable post-op   Nonhealing right femur fracture -Outpatient follow-up with orthopedic surgery   HTN Bp wnl - resume home meds as needed    Morbid obesity - noted  DVT prophylaxis: Lovenox Code Status: dnr confirmed with patient today with her friend on the phone Communication: Discussed with patient and her good friend over the phone  Status is: Inpatient Will need placement, no snf bed yet   Subjective: Patient was resting comfortably in bed when seen today.  Pain controlled. Tolerating diet  Examination:  General.  Obese lady, in no acute distress. Pulmonary.  Lungs clear bilaterally, normal respiratory effort. CV.  Regular rate and rhythm, no JVD, rub or murmur. Abdomen.  Soft, nontender, nondistended, BS positive. CNS.  Alert and oriented .  No focal neurologic deficit. Extremities.  No edema, no cyanosis, pulses intact and symmetrical.  Right knee with knee brace and foot with Ace wrap. Psychiatry.  Judgment and insight appears normal.     Objective: Vitals:   06/03/22 1856 06/03/22 2058 06/04/22 0557 06/04/22 0759  BP: (!) 160/70 (!) 154/67 (!) 152/74 129/70  Pulse:  82 74 72  Resp:  '17 17 18  '$ Temp:  98.7 F (37.1 C) 98 F (36.7 C) 98 F (36.7 C)  TempSrc:    Oral  SpO2:  96% 97% 95%  Weight:      Height:        Intake/Output Summary (Last 24 hours) at 06/04/2022 1530 Last data filed at 06/04/2022 1347 Gross per 24 hour  Intake 1740 ml  Output 1200 ml  Net 540 ml   Filed Weights   05/30/22 1924  Weight: (!) 136.1 kg     Data Reviewed:   CBC: Recent Labs  Lab 05/30/22 2144 06/01/22 0706 06/02/22 0645 06/03/22 0404 06/04/22 0617  WBC 14.0* 9.1 8.6 9.1 10.5  NEUTROABS 12.3*  --   --   --   --   HGB 12.8 9.6* 10.4* 10.5* 10.9*  HCT 40.5 30.8* 33.9* 33.5* 33.6*  MCV 92.5 94.2 95.0 92.5 90.1  PLT 357 262 272 318 035   Basic Metabolic Panel: Recent Labs  Lab 05/30/22 2144 06/01/22 0706 06/02/22 0645 06/03/22 0404 06/04/22 0617  NA 141  --  138 137 136  K 3.7 3.9 4.2 4.0 4.8  CL 103  --  102 102 96*  CO2 27  --  '29 28 28  '$ GLUCOSE 116*  --  105* 114* 150*  BUN 15  --  '16  15 14  '$ CREATININE 0.83  --  0.97 0.94 0.89  CALCIUM 10.5*  --  9.4 9.2 9.3  MG  --   --  1.6* 2.0 1.9   GFR: Estimated Creatinine Clearance: 73.9 mL/min (by C-G formula based on SCr of 0.89 mg/dL). Liver Function Tests: Recent Labs  Lab 05/30/22 2144  AST 15  ALT 12  ALKPHOS 70  BILITOT 0.6  PROT 7.8  ALBUMIN 4.2   No results for input(s): "LIPASE", "AMYLASE" in the last 168 hours. No results for input(s): "AMMONIA" in the last 168 hours. Coagulation Profile: No results for input(s): "INR", "PROTIME" in the last 168 hours. Cardiac Enzymes: No results for input(s): "CKTOTAL", "CKMB", "CKMBINDEX", "TROPONINI" in the last 168 hours. BNP (last 3 results) No results for input(s): "PROBNP" in the last 8760 hours. HbA1C: No results for input(s): "HGBA1C" in the last 72 hours. CBG: Recent Labs  Lab 06/03/22 1531 06/03/22 1754 06/03/22 2034 06/04/22 0801 06/04/22 1140  GLUCAP 100* 102* 152* 153* 123*   Lipid Profile: No results for input(s): "CHOL", "HDL", "LDLCALC", "TRIG", "CHOLHDL", "LDLDIRECT" in the last 72 hours. Thyroid Function Tests: No results for input(s): "TSH", "T4TOTAL", "FREET4", "T3FREE", "THYROIDAB" in the last 72 hours. Anemia Panel: No results for input(s): "VITAMINB12", "FOLATE", "FERRITIN", "TIBC", "IRON", "RETICCTPCT" in the last 72 hours. Sepsis Labs: No results for input(s): "PROCALCITON", "LATICACIDVEN" in the last 168 hours.  Recent Results (from the past 240 hour(s))  Surgical PCR screen     Status: None   Collection Time: 06/02/22  9:00 PM   Specimen: Nasal Mucosa; Nasal Swab  Result Value Ref Range Status   MRSA, PCR NEGATIVE NEGATIVE Final   Staphylococcus aureus NEGATIVE NEGATIVE Final    Comment: (NOTE) The Xpert SA Assay (FDA approved for NASAL specimens in patients 40 years of age and older), is one component of a comprehensive surveillance program. It is not intended to diagnose infection nor to guide or monitor treatment. Performed  at [facility], [place] [address], [place], [place] [address]          Radiology Studies: DG Knee Right Port  Result Date: 06/03/2022 CLINICAL DATA:  Right tibial fracture status post repair EXAM: PORTABLE RIGHT KNEE - 1-2 VIEW COMPARISON:  05/30/2022 FINDINGS: Frontal and lateral views of the right knee are obtained. Interval placement of a lateral plate and screw fixation across the proximal tibial metadiaphyseal fracture, with near anatomic alignment. Proximal fibular fracture unchanged. Stable orthopedic hardware from prior femoral fracture repair. There is diffuse soft tissue swelling. Subcutaneous gas consistent with recent surgical intervention. IMPRESSION: 1. ORIF of the proximal right tibial fracture seen previously, with near anatomic alignment. 2. Essentially nondisplaced proximal fibular head fracture, stable. Electronically Signed   By: Randa Ngo M.D.   On: 06/03/2022 19:40   DG Tibia/Fibula Right  Result Date: 06/03/2022  CLINICAL DATA:  [id number] Surgery, elective [id number] EXAM: RIGHT TIBIA AND FIBULA - 2 VIEW COMPARISON:  May 30, 2022 FINDINGS: Spot fluoroscopy images were obtained for surgical planning purposes. Patient is status post ORIF of the tibia. Tibial fracture appears similar comparison to prior with a mildly impacted fracture. Nondisplaced fracture of the fibular neck. Time: 38 seconds Dose: 2.75 mGy Please reference procedure report for further details. IMPRESSION: Spot fluoroscopy images obtained for surgical planning purposes. Electronically Signed   By: Valentino Saxon M.D.   On: 06/03/2022 18:14   DG C-Arm 1-60 Min-No Report  Result Date: 06/03/2022 Fluoroscopy was utilized by the requesting physician.  No radiographic interpretation.        Scheduled Meds:  acetaminophen  1,000 mg Oral Q4H   aspirin EC  81 mg Oral Daily   cholecalciferol  1,000 Units Oral Daily   enoxaparin (LOVENOX) injection  0.5 mg/kg Subcutaneous Q24H   ferrous sulfate   650 mg Oral Daily   gabapentin  1,200 mg Oral QHS   gabapentin  600 mg Oral Daily   insulin aspart  0-5 Units Subcutaneous QHS   insulin aspart  0-9 Units Subcutaneous TID WC   metFORMIN  500 mg Oral BID WC   mupirocin ointment  1 Application Nasal BID   pantoprazole  40 mg Oral Daily   Continuous Infusions:   ceFAZolin (ANCEF) IV 2 g (06/04/22 1518)     LOS: 4 days   Time spent= 35 mins  Desma Maxim, MD [facility]  If 7PM-7AM, please contact night-coverage  06/04/2022, 3:30 PM

## 2022-06-05 LAB — BASIC METABOLIC PANEL
Anion gap: 8 (ref 5–15)
BUN: 21 mg/dL (ref 8–23)
CO2: 28 mmol/L (ref 22–32)
Calcium: 9 mg/dL (ref 8.9–10.3)
Chloride: 101 mmol/L (ref 98–111)
Creatinine, Ser: 1.02 mg/dL — ABNORMAL HIGH (ref 0.44–1.00)
GFR, Estimated: 58 mL/min — ABNORMAL LOW (ref >60)
Glucose, Bld: 107 mg/dL — ABNORMAL HIGH (ref 70–99)
Potassium: 4.2 mmol/L (ref 3.5–5.1)
Sodium: 137 mmol/L (ref 135–145)

## 2022-06-05 LAB — CBC
HCT: 31.6 % — ABNORMAL LOW (ref 36.0–46.0)
Hemoglobin: 9.8 g/dL — ABNORMAL LOW (ref 12.0–15.0)
MCH: 29.3 pg (ref 26.0–34.0)
MCHC: 31 g/dL (ref 30.0–36.0)
MCV: 94.3 fL (ref 80.0–100.0)
Platelets: 366 10*3/uL (ref 150–400)
RBC: 3.35 MIL/uL — ABNORMAL LOW (ref 3.87–5.11)
RDW: 14.9 % (ref 11.5–15.5)
WBC: 11.5 10*3/uL — ABNORMAL HIGH (ref 4.0–10.5)
nRBC: 0 % (ref 0.0–0.2)

## 2022-06-05 LAB — GLUCOSE, CAPILLARY
Glucose-Capillary: 108 mg/dL — ABNORMAL HIGH (ref 70–99)
Glucose-Capillary: 91 mg/dL (ref 70–99)
Glucose-Capillary: 97 mg/dL (ref 70–99)
Glucose-Capillary: 114 mg/dL — ABNORMAL HIGH (ref 70–99)

## 2022-06-05 MED ORDER — ENOXAPARIN SODIUM 80 MG/0.8ML IJ SOSY
70.0000 mg | PREFILLED_SYRINGE | INTRAMUSCULAR | Status: DC
  Administered 2022-06-06 – 2022-06-09 (×4): 70 mg via SUBCUTANEOUS
  Filled 2022-06-05 (×4): qty 0.8

## 2022-06-05 MED ORDER — LACTATED RINGERS IV SOLN: INTRAVENOUS | Status: AC

## 2022-06-05 NOTE — Progress Notes (Signed)
Orthopaedic Trauma Service Progress Note  Patient ID: [name] [name] MRN: [id number] DOB/AGE: 1950-11-24 72 y.o.  Subjective:  No ortho issues   ROS As above  Objective:   VITALS:   Vitals:   06/04/22 1604 06/04/22 2006 06/05/22 0528 06/05/22 0816  BP: 118/63 116/62 123/68 113/66  Pulse: 93 83 78 77  Resp: '18 17 17 18  '$ Temp: 98.4 F (36.9 C) 98.4 F (36.9 C) 98 F (36.7 C) 97.7 F (36.5 C)  TempSrc: Oral Oral  Oral  SpO2: 93% 98% 94% 95%  Weight:      Height:        Estimated body mass index is 56.69 kg/m as calculated from the following:   Height as of this encounter: '5\' 1"'$  (1.549 m).   Weight as of this encounter: 136.1 kg.   Intake/Output      01/19 0701 01/20 0700 01/20 0701 01/21 0700   P.O. 472    I.V. (mL/kg)     Total Intake(mL/kg) 472 (3.5)    Urine (mL/kg/hr) 700 (0.2)    Blood     Total Output 700    Net -228           LABS  Results for orders placed or performed during the hospital encounter of 05/30/22 (from the past 24 hour(s))  Glucose, capillary     Status: Abnormal   Collection Time: 06/04/22 11:40 AM  Result Value Ref Range   Glucose-Capillary 123 (H) 70 - 99 mg/dL  Glucose, capillary     Status: Abnormal   Collection Time: 06/04/22  4:01 PM  Result Value Ref Range   Glucose-Capillary 142 (H) 70 - 99 mg/dL  Glucose, capillary     Status: Abnormal   Collection Time: 06/04/22  8:47 PM  Result Value Ref Range   Glucose-Capillary 128 (H) 70 - 99 mg/dL  Basic metabolic panel     Status: Abnormal   Collection Time: 06/05/22  7:51 AM  Result Value Ref Range   Sodium 137 135 - 145 mmol/L   Potassium 4.2 3.5 - 5.1 mmol/L   Chloride 101 98 - 111 mmol/L   CO2 28 22 - 32 mmol/L   Glucose, Bld 107 (H) 70 - 99 mg/dL   BUN 21 8 - 23 mg/dL   Creatinine, Ser 1.02 (H) 0.44 - 1.00 mg/dL   Calcium 9.0 8.9 - 10.3 mg/dL   GFR, Estimated 58 (L) >60 mL/min   Anion gap 8 5  - 15  CBC     Status: Abnormal   Collection Time: 06/05/22  7:51 AM  Result Value Ref Range   WBC 11.5 (H) 4.0 - 10.5 K/uL   RBC 3.35 (L) 3.87 - 5.11 MIL/uL   Hemoglobin 9.8 (L) 12.0 - 15.0 g/dL   HCT 31.6 (L) 36.0 - 46.0 %   MCV 94.3 80.0 - 100.0 fL   MCH 29.3 26.0 - 34.0 pg   MCHC 31.0 30.0 - 36.0 g/dL   RDW 14.9 11.5 - 15.5 %   Platelets 366 150 - 400 K/uL   nRBC 0.0 0.0 - 0.2 %  Glucose, capillary     Status: Abnormal   Collection Time: 06/05/22  8:18 AM  Result Value Ref Range   Glucose-Capillary 108 (H) 70 - 99 mg/dL     PHYSICAL EXAM:  Gen: sitting up in bed, pleasant  Ext:       R Lower extremity               Moderate swelling R lower leg and ecchymosis noted, stable  Incisions stable without drainage, changed this AM             TTP proximal R tibia              + DP pulse             No DCT              Compartments soft              Distal motor and sensory functions at baseline and appear grossly intact    Assessment/Plan: 2 Days Post-Op   Principal Problem:   Periprosthetic fracture of shaft of femur Active Problems:   Acute blood loss anemia   Morbid obesity with BMI of 45.0-49.9, adult (HCC)   COPD (chronic obstructive pulmonary disease) (HCC)   Closed right tibial fracture   Anti-infectives (From admission, onward)    Start     Dose/Rate Route Frequency Ordered Stop   06/03/22 2200  ceFAZolin (ANCEF) IVPB 2g/100 mL premix        2 g 200 mL/hr over 30 Minutes Intravenous Every 8 hours 06/03/22 1847 06/04/22 1548   06/03/22 1400  ceFAZolin (ANCEF) IVPB 3g/100 mL premix        3 g 200 mL/hr over 30 Minutes Intravenous  Once 06/02/22 0938 06/03/22 1629     .  POD/HD#: 1   -R proximal tibia fracture s/p ORIF  Weightbearing NWB R leg x 6 weeks   ROM/Activity   Unrestricted ROM R knee and ankle   Wound care   Daily wound care as needed   Change as needed   Knee High TED   PT/OT evals  TOC consult  Home situation sounds  difficult   Will likely need SNF   - R proximal femur nonunion. Asymptomatic and with stable hardware  Monitor   - Pain management:  Multimodal   - ABL anemia/Hemodynamics  Stable  - Medical issues   Per primary   - DVT/PE prophylaxis:  Weightbased lovenox    Recommend lovenox x4 weeks  - ID:   Periop abx   - Metabolic Bone Disease:  Vitamin d insufficiency    Supplement - Activity:  As above  - Dispo:  Therapy evals  Likely ready for SNF Monday    Shona Needles, MD [facility] [phone] (office) [email or web address]  06/05/2022, 9:04 AM  [facility] [place] [address] [phone] Jenetta [phone] (F)    After 5pm and on the weekends please log on to Amion, go to orthopaedics and the look under the [facility] Call for the provider(s) on call. You can also call our office at [phone] and then follow the prompts to be connected to the call team.  Patient ID: [name] [name], female   DOB: [birth year], 72 y.o.   MRN: [id number]

## 2022-06-05 NOTE — Progress Notes (Signed)
PROGRESS NOTE    [name] [name]  DJS:[id number]  DOB: 1951-01-08  DOA: 05/30/2022 PCP: Aletha Halim., PA-C Outpatient Specialists:   Hospital course:  72 year old with history of frequent falls with frequent falls, COPD, HTN, HLD, DM 2, chronic right femur fracture since 2017, ambulatory dysfunction, diabetic neuropathy sustained a mechanical fall on the right knee.  CT and x-ray showed nondisplaced large transverse fracture metaphyseal region of the proximal tibia, femoral neck and chronic nonhealing femoral shaft fracture.  Ortho was consulted who recommended knee immobilizer and nonweightbearing of the right lower extremity.  PT/OT recommended SNF.   1/17: Vitals stable.  Due to excessive pain and difficulty participating with PT orthopedic surgery decided to take her to the OR on 06/03/2022. Vitamin D level low at 24-started on supplement Hypomagnesemia with magnesium of 1.6-ordered replacement   1/18: Patient remained stable.  Going to the OR for ORIF later today.  Hypomagnesemia has been resolved.  1/19: feeling fine after surgery yesterday. TOC working on SNF    Subjective:  Patient states she is doing well and other than itching in her back she has no complaints.   Objective: Vitals:   06/04/22 1604 06/04/22 2006 06/05/22 0528 06/05/22 0816  BP: 118/63 116/62 123/68 113/66  Pulse: 93 83 78 77  Resp: '18 17 17 18  '$ Temp: 98.4 F (36.9 C) 98.4 F (36.9 C) 98 F (36.7 C) 97.7 F (36.5 C)  TempSrc: Oral Oral  Oral  SpO2: 93% 98% 94% 95%  Weight:      Height:        Intake/Output Summary (Last 24 hours) at 06/05/2022 1615 Last data filed at 06/05/2022 1230 Gross per 24 hour  Intake --  Output 700 ml  Net -700 ml   Filed Weights   05/30/22 1924  Weight: (!) 136.1 kg     Exam:  General: Patient in good spirits visiting with a friend in [place] Eyes: sclera anicteric, conjuctiva mild injection bilaterally CVS: S1-S2, regular  Respiratory:  decreased air  entry bilaterally secondary to decreased inspiratory effort, rales at bases  GI: NABS, soft, NT  LE: Right knee with knee brace Neuro: A/O x 3,  grossly nonfocal.  Psych: patient is logical and coherent, judgement and insight appear normal, mood and affect appropriate to situation.  Data Reviewed:  Basic Metabolic Panel: Recent Labs  Lab 05/30/22 2144 06/01/22 0706 06/02/22 0645 06/03/22 0404 06/04/22 0617 06/05/22 0751  NA 141  --  138 137 136 137  K 3.7 3.9 4.2 4.0 4.8 4.2  CL 103  --  102 102 96* 101  CO2 27  --  '29 28 28 28  '$ GLUCOSE 116*  --  105* 114* 150* 107*  BUN 15  --  '16 15 14 21  '$ CREATININE 0.83  --  0.97 0.94 0.89 1.02*  CALCIUM 10.5*  --  9.4 9.2 9.3 9.0  MG  --   --  1.6* 2.0 1.9  --     CBC: Recent Labs  Lab 05/30/22 2144 06/01/22 0706 06/02/22 0645 06/03/22 0404 06/04/22 0617 06/05/22 0751  WBC 14.0* 9.1 8.6 9.1 10.5 11.5*  NEUTROABS 12.3*  --   --   --   --   --   HGB 12.8 9.6* 10.4* 10.5* 10.9* 9.8*  HCT 40.5 30.8* 33.9* 33.5* 33.6* 31.6*  MCV 92.5 94.2 95.0 92.5 90.1 94.3  PLT 357 262 272 318 361 366     Scheduled Meds:  acetaminophen  1,000 mg Oral  Q4H   cholecalciferol  1,000 Units Oral Daily   [START ON 06/06/2022] enoxaparin (LOVENOX) injection  70 mg Subcutaneous Q24H   ferrous sulfate  650 mg Oral Daily   gabapentin  1,200 mg Oral QHS   gabapentin  600 mg Oral Daily   insulin aspart  0-5 Units Subcutaneous QHS   insulin aspart  0-9 Units Subcutaneous TID WC   metFORMIN  500 mg Oral BID WC   mupirocin ointment  1 Application Nasal BID   pantoprazole  40 mg Oral Daily   polyethylene glycol  17 g Oral Daily   Continuous Infusions:   Assessment & Plan:   Right tib-fib fracture POD #2 s/p ORIF Pain management and DVT prophylaxis per orthopedics Patient continues to work with PT and OT Plan is for discharge with 4 weeks of Lovenox for DVT prophylaxis  AKI Will hydrate gently and repeat in the morning Echocardiogram from 2017  with normal EF Patient is not on any nephrotoxic agents  ABLA H&H is essentially stable, recheck in the morning Is hemodynamically stable  DM 2 Blood sugars under reasonable control on present regimen  HTN Patient is normotensive off of her antihypertensives at present Resume home meds when appropriate  Disposition TOC working for SNF placement    DVT prophylaxis: Lovenox Code Status: DNR  Studies: DG Knee Right Port  Result Date: 06/03/2022 CLINICAL DATA:  Right tibial fracture status post repair EXAM: PORTABLE RIGHT KNEE - 1-2 VIEW COMPARISON:  05/30/2022 FINDINGS: Frontal and lateral views of the right knee are obtained. Interval placement of a lateral plate and screw fixation across the proximal tibial metadiaphyseal fracture, with near anatomic alignment. Proximal fibular fracture unchanged. Stable orthopedic hardware from prior femoral fracture repair. There is diffuse soft tissue swelling. Subcutaneous gas consistent with recent surgical intervention. IMPRESSION: 1. ORIF of the proximal right tibial fracture seen previously, with near anatomic alignment. 2. Essentially nondisplaced proximal fibular head fracture, stable. Electronically Signed   By: Randa Ngo M.D.   On: 06/03/2022 19:40   DG Tibia/Fibula Right  Result Date: 06/03/2022 CLINICAL DATA:  [id number] Surgery, elective [id number] EXAM: RIGHT TIBIA AND FIBULA - 2 VIEW COMPARISON:  May 30, 2022 FINDINGS: Spot fluoroscopy images were obtained for surgical planning purposes. Patient is status post ORIF of the tibia. Tibial fracture appears similar comparison to prior with a mildly impacted fracture. Nondisplaced fracture of the fibular neck. Time: 38 seconds Dose: 2.75 mGy Please reference procedure report for further details. IMPRESSION: Spot fluoroscopy images obtained for surgical planning purposes. Electronically Signed   By: Valentino Saxon M.D.   On: 06/03/2022 18:14   DG C-Arm 1-60 Min-No Report  Result Date:  06/03/2022 Fluoroscopy was utilized by the requesting physician.  No radiographic interpretation.    Principal Problem:   Periprosthetic fracture of shaft of femur Active Problems:   Acute blood loss anemia   Morbid obesity with BMI of 45.0-49.9, adult ([place])   COPD (chronic obstructive pulmonary disease) ([place])   Closed right tibial fracture     Vashti Hey, [facility]  If 7PM-7AM, please contact night-coverage [email or web address]   LOS: 5 days

## 2022-06-06 LAB — CBC
HCT: 30.3 % — ABNORMAL LOW (ref 36.0–46.0)
Hemoglobin: 9.6 g/dL — ABNORMAL LOW (ref 12.0–15.0)
MCH: 29.8 pg (ref 26.0–34.0)
MCHC: 31.7 g/dL (ref 30.0–36.0)
MCV: 94.1 fL (ref 80.0–100.0)
Platelets: 343 10*3/uL (ref 150–400)
RBC: 3.22 MIL/uL — ABNORMAL LOW (ref 3.87–5.11)
RDW: 14.8 % (ref 11.5–15.5)
WBC: 9.5 10*3/uL (ref 4.0–10.5)
nRBC: 0 % (ref 0.0–0.2)

## 2022-06-06 LAB — BASIC METABOLIC PANEL
Anion gap: 8 (ref 5–15)
BUN: 23 mg/dL (ref 8–23)
CO2: 30 mmol/L (ref 22–32)
Calcium: 9.2 mg/dL (ref 8.9–10.3)
Chloride: 97 mmol/L — ABNORMAL LOW (ref 98–111)
Creatinine, Ser: 1.08 mg/dL — ABNORMAL HIGH (ref 0.44–1.00)
GFR, Estimated: 54 mL/min — ABNORMAL LOW (ref >60)
Glucose, Bld: 103 mg/dL — ABNORMAL HIGH (ref 70–99)
Potassium: 4.4 mmol/L (ref 3.5–5.1)
Sodium: 135 mmol/L (ref 135–145)

## 2022-06-06 LAB — GLUCOSE, CAPILLARY
Glucose-Capillary: 108 mg/dL — ABNORMAL HIGH (ref 70–99)
Glucose-Capillary: 113 mg/dL — ABNORMAL HIGH (ref 70–99)
Glucose-Capillary: 95 mg/dL (ref 70–99)
Glucose-Capillary: 107 mg/dL — ABNORMAL HIGH (ref 70–99)

## 2022-06-06 MED ORDER — SENNOSIDES-DOCUSATE SODIUM 8.6-50 MG PO TABS
3.0000 | ORAL_TABLET | Freq: Every day | ORAL | Status: DC
  Administered 2022-06-06: 3 via ORAL
  Filled 2022-06-06: qty 3

## 2022-06-06 MED ORDER — SODIUM CHLORIDE 0.9 % IV SOLN: INTRAVENOUS | Status: DC

## 2022-06-06 MED ORDER — BISACODYL 10 MG RE SUPP
10.0000 mg | Freq: Once | RECTAL | Status: AC
  Administered 2022-06-06: 10 mg via RECTAL
  Filled 2022-06-06: qty 1

## 2022-06-06 MED ORDER — ACETAMINOPHEN 325 MG PO TABS
650.0000 mg | ORAL_TABLET | ORAL | Status: DC
  Administered 2022-06-06 – 2022-06-09 (×15): 650 mg via ORAL
  Filled 2022-06-06 (×15): qty 2

## 2022-06-06 MED ORDER — FLEET ENEMA 7-19 GM/118ML RE ENEM: 1.0000 | ENEMA | Freq: Every day | RECTAL | Status: DC | PRN

## 2022-06-06 NOTE — Progress Notes (Addendum)
PROGRESS NOTE    [name] [name]  GYB:[id number]  DOB: 30-Jun-1950  DOA: 05/30/2022 PCP: Aletha Halim., PA-C Outpatient Specialists:   Hospital course:  72 year old with history of frequent falls with frequent falls, COPD, HTN, HLD, DM 2, chronic right femur fracture since 2017, ambulatory dysfunction, diabetic neuropathy sustained a mechanical fall on the right knee.  CT and x-ray showed nondisplaced large transverse fracture metaphyseal region of the proximal tibia, femoral neck and chronic nonhealing femoral shaft fracture.  Ortho was consulted who recommended knee immobilizer and nonweightbearing of the right lower extremity.  PT/OT recommended SNF.   1/17: Vitals stable.  Due to excessive pain and difficulty participating with PT orthopedic surgery decided to take her to the OR on 06/03/2022. Vitamin D level low at 24-started on supplement Hypomagnesemia with magnesium of 1.6-ordered replacement   1/18: Patient remained stable.  Going to the OR for ORIF later today.  Hypomagnesemia has been resolved. 1/19: feeling fine after surgery yesterday. TOC working on SNF  1/20: No new complaints, pain well-controlled   Subjective:  Patient notes she is constipated.  Last BM 3 to 4 days ago.  She has been on MiraLAX and was hoping it would work but notes she has not yet had a bowel movement.  Agrees reluctantly to getting a suppository.   Objective: Vitals:   06/05/22 0816 06/05/22 2046 06/06/22 0414 06/06/22 0818  BP: 113/66 122/63 (!) 147/68 127/68  Pulse: 77 84 73 75  Resp: '18 16 16 18  '$ Temp: 97.7 F (36.5 C) 98.3 F (36.8 C) 98 F (36.7 C) 97.8 F (36.6 C)  TempSrc: Oral Oral  Oral  SpO2: 95% 99% 96% 96%  Weight:      Height:        Intake/Output Summary (Last 24 hours) at 06/06/2022 1533 Last data filed at 06/06/2022 1204 Gross per 24 hour  Intake 27.33 ml  Output 3200 ml  Net -3172.67 ml    Filed Weights   05/30/22 1924  Weight: (!) 136.1 kg      Exam:  General: Patient sleeping quietly in bed, arouses to voice alone and has a coherent conversation Eyes: sclera anicteric, conjuctiva mild injection bilaterally CVS: S1-S2, regular  Respiratory:  decreased air entry bilaterally secondary to decreased inspiratory effort, rales at bases  GI: NABS, soft, NT  LE: Right knee with knee brace Neuro: A/O x 3,  grossly nonfocal.  Psych: patient is logical and coherent, judgement and insight appear normal, mood and affect appropriate to situation.  Data Reviewed:  Basic Metabolic Panel: Recent Labs  Lab 06/02/22 0645 06/03/22 0404 06/04/22 0617 06/05/22 0751 06/06/22 0456  NA 138 137 136 137 135  K 4.2 4.0 4.8 4.2 4.4  CL 102 102 96* 101 97*  CO2 '29 28 28 28 30  '$ GLUCOSE 105* 114* 150* 107* 103*  BUN '16 15 14 21 23  '$ CREATININE 0.97 0.94 0.89 1.02* 1.08*  CALCIUM 9.4 9.2 9.3 9.0 9.2  MG 1.6* 2.0 1.9  --   --      CBC: Recent Labs  Lab 05/30/22 2144 06/01/22 0706 06/02/22 0645 06/03/22 0404 06/04/22 0617 06/05/22 0751 06/06/22 0456  WBC 14.0*   < > 8.6 9.1 10.5 11.5* 9.5  NEUTROABS 12.3*  --   --   --   --   --   --   HGB 12.8   < > 10.4* 10.5* 10.9* 9.8* 9.6*  HCT 40.5   < > 33.9* 33.5* 33.6* 31.6* 30.3*  MCV 92.5   < > 95.0 92.5 90.1 94.3 94.1  PLT 357   < > 272 318 361 366 343   < > = values in this interval not displayed.      Scheduled Meds:  acetaminophen  650 mg Oral Q4H   cholecalciferol  1,000 Units Oral Daily   enoxaparin (LOVENOX) injection  70 mg Subcutaneous Q24H   ferrous sulfate  650 mg Oral Daily   gabapentin  1,200 mg Oral QHS   gabapentin  600 mg Oral Daily   insulin aspart  0-5 Units Subcutaneous QHS   insulin aspart  0-9 Units Subcutaneous TID WC   metFORMIN  500 mg Oral BID WC   mupirocin ointment  1 Application Nasal BID   pantoprazole  40 mg Oral Daily   polyethylene glycol  17 g Oral Daily   Continuous Infusions:   Assessment & Plan:   AKI Creatinine remains elevated  postop without improvement with gentle hydration overnight Patient is not on any nephrotoxic agents Will start NS 100 cc an hour Echocardiogram from 2017 with normal EF Patient is not on any nephrotoxic agents  Constipation Dulcolax suppository x 1 today, if no effect can try fleets enema Add senna daily  ABLA H&H remained stable  is hemodynamically stable  Right tib-fib fracture POD #3 s/p ORIF Pain management and DVT prophylaxis per orthopedics Patient continues to work with PT and OT Plan is for discharge with 4 weeks of Lovenox for DVT prophylaxis  DM 2 Blood sugars under reasonable control on present regimen  HTN Patient is normotensive off of her antihypertensives at present Resume home meds when appropriate  Disposition TOC working for SNF placement    DVT prophylaxis: Lovenox Code Status: DNR  Studies: No results found.  Principal Problem:   Periprosthetic fracture of shaft of femur Active Problems:   Acute blood loss anemia   Morbid obesity with BMI of 45.0-49.9, adult ([place])   COPD (chronic obstructive pulmonary disease) ([place])   Closed right tibial fracture     Vashti Hey, [facility]  If 7PM-7AM, please contact night-coverage [email or web address]   LOS: 6 days

## 2022-06-07 LAB — BASIC METABOLIC PANEL
Anion gap: 7 (ref 5–15)
BUN: 19 mg/dL (ref 8–23)
CO2: 29 mmol/L (ref 22–32)
Calcium: 8.8 mg/dL — ABNORMAL LOW (ref 8.9–10.3)
Chloride: 99 mmol/L (ref 98–111)
Creatinine, Ser: 1.04 mg/dL — ABNORMAL HIGH (ref 0.44–1.00)
GFR, Estimated: 57 mL/min — ABNORMAL LOW (ref >60)
Glucose, Bld: 108 mg/dL — ABNORMAL HIGH (ref 70–99)
Potassium: 3.9 mmol/L (ref 3.5–5.1)
Sodium: 135 mmol/L (ref 135–145)

## 2022-06-07 LAB — CBC
HCT: 30.9 % — ABNORMAL LOW (ref 36.0–46.0)
Hemoglobin: 9.9 g/dL — ABNORMAL LOW (ref 12.0–15.0)
MCH: 30.1 pg (ref 26.0–34.0)
MCHC: 32 g/dL (ref 30.0–36.0)
MCV: 93.9 fL (ref 80.0–100.0)
Platelets: 338 10*3/uL (ref 150–400)
RBC: 3.29 MIL/uL — ABNORMAL LOW (ref 3.87–5.11)
RDW: 14.5 % (ref 11.5–15.5)
WBC: 8.6 10*3/uL (ref 4.0–10.5)
nRBC: 0 % (ref 0.0–0.2)

## 2022-06-07 LAB — GLUCOSE, CAPILLARY
Glucose-Capillary: 116 mg/dL — ABNORMAL HIGH (ref 70–99)
Glucose-Capillary: 129 mg/dL — ABNORMAL HIGH (ref 70–99)
Glucose-Capillary: 116 mg/dL — ABNORMAL HIGH (ref 70–99)
Glucose-Capillary: 120 mg/dL — ABNORMAL HIGH (ref 70–99)

## 2022-06-07 MED ORDER — MORPHINE SULFATE (PF) 4 MG/ML IV SOLN: 4.0000 mg | INTRAVENOUS | Status: DC | PRN

## 2022-06-07 MED ORDER — LEVOTHYROXINE SODIUM 25 MCG PO TABS
25.0000 ug | ORAL_TABLET | Freq: Every day | ORAL | Status: DC
  Administered 2022-06-08 – 2022-06-09 (×2): 25 ug via ORAL
  Filled 2022-06-07 (×2): qty 1

## 2022-06-07 MED ORDER — SENNOSIDES-DOCUSATE SODIUM 8.6-50 MG PO TABS: 1.0000 | ORAL_TABLET | Freq: Two times a day (BID) | ORAL | Status: DC

## 2022-06-07 MED ORDER — BISACODYL 10 MG RE SUPP: 10.0000 mg | Freq: Every day | RECTAL | Status: DC | PRN

## 2022-06-07 MED ORDER — SENNOSIDES-DOCUSATE SODIUM 8.6-50 MG PO TABS
2.0000 | ORAL_TABLET | Freq: Two times a day (BID) | ORAL | Status: DC
  Administered 2022-06-07 – 2022-06-08 (×3): 2 via ORAL
  Filled 2022-06-07 (×3): qty 2

## 2022-06-07 MED ORDER — FERROUS SULFATE 325 (65 FE) MG PO TABS
650.0000 mg | ORAL_TABLET | Freq: Every day | ORAL | Status: DC
  Administered 2022-06-09: 650 mg via ORAL
  Filled 2022-06-07: qty 2

## 2022-06-07 NOTE — Progress Notes (Signed)
[name] [name]  NID:[id number] DOB: [birth year] DOA: 05/30/2022 PCP: Aletha Halim., PA-C    Brief Narrative:  72 year old with a history of frequent falls, COPD, HTN, HLD, DM2 with neuropathy, and chronic right femur fracture since 2017 who sustained a mechanical fall onto her right knee and presented to the hospital 05/30/2022.  X-ray studies revealed a nondisplaced large transverse fracture of the proximal tibia as well as femoral neck and chronic nonhealing femoral shaft fractures.  Orthopedics evaluated the patient and recommended a knee immobilizer with nonweightbearing status of the right leg.  Ultimately she was taken to the OR 06/03/2022 for ORIF of her proximal tibia fracture due to persistent uncontrolled pain.  She is presently awaiting SNF placement.  Consultants:  Orthopedic Surgery  Goals of Care:  Code Status: DNR   DVT prophylaxis: Lovenox  Interim Hx: Afebrile.  Vital signs stable.  CBG well-controlled.  No new complaints today.  Denies shortness of breath or chest pain.  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  Family Communication: No family present at time of exam Disposition: From home -needs SNF rehab stay -medically stable for SNF when bed available   Objective: Blood pressure  134/84, pulse 81, temperature 98.2 F (36.8 C), temperature source Oral, resp. rate 18, height '5\' 1"'$  (1.549 m), weight (!) 136.1 kg, SpO2 96 %.  Intake/Output Summary (Last 24 hours) at 06/07/2022 0815 Last data filed at 06/07/2022 0534 Gross per 24 hour  Intake --  Output 2500 ml  Net -2500 ml   Filed Weights   05/30/22 1924  Weight: (!) 136.1 kg    Examination: General: No acute respiratory distress Lungs: Clear to auscultation bilaterally without wheezes or crackles Cardiovascular: Regular rate and rhythm without murmur Abdomen: Nontender, nondistended, soft, bowel sounds positive, no rebound Extremities: Trace right lower extremity edema without cyanosis or clubbing  CBC: Recent Labs  Lab 06/05/22 0751 06/06/22 0456 06/07/22 0411  WBC 11.5* 9.5 8.6  HGB 9.8* 9.6* 9.9*  HCT 31.6* 30.3* 30.9*  MCV 94.3 94.1 93.9  PLT 366 343 144   Basic Metabolic Panel: Recent Labs  Lab 06/02/22 0645 06/03/22 0404 06/04/22 0617 06/05/22 0751 06/06/22 0456 06/07/22 0411  NA 138 137 136 137 135 135  K 4.2 4.0 4.8 4.2 4.4 3.9  CL 102 102 96* 101 97* 99  CO2 '29 28 28 28 30 29  '$ GLUCOSE 105* 114* 150* 107* 103* 108*  BUN '16 15 14 21 23 19  '$ CREATININE 0.97 0.94 0.89 1.02* 1.08* 1.04*  CALCIUM 9.4 9.2 9.3 9.0 9.2 8.8*  MG 1.6* 2.0 1.9  --   --   --    GFR: Estimated Creatinine Clearance: 63.2 mL/min (A) (by C-G formula based on SCr of 1.04 mg/dL (H)).   Scheduled Meds:  acetaminophen  650 mg Oral Q4H   cholecalciferol  1,000 Units Oral Daily   enoxaparin (LOVENOX)  injection  70 mg Subcutaneous Q24H   ferrous sulfate  650 mg Oral Daily   gabapentin  1,200 mg Oral QHS   gabapentin  600 mg Oral Daily   insulin aspart  0-5 Units Subcutaneous QHS   insulin aspart  0-9 Units Subcutaneous TID WC   metFORMIN  500 mg Oral BID WC   mupirocin ointment  1 Application Nasal BID   pantoprazole  40 mg Oral Daily   polyethylene glycol  17 g Oral Daily   senna-docusate  3 tablet Oral QHS    Continuous Infusions:  sodium chloride 100 mL/hr at 06/07/22 0257     LOS: 7 days   Cherene Altes, MD [facility] Office  [phone] Pager - Text Page per Shea Evans  If 7PM-7AM, please contact night-coverage per Amion 06/07/2022, 8:15 AM

## 2022-06-07 NOTE — Progress Notes (Signed)
Physical Therapy Treatment Patient Details Name: [name] [name] MRN: [id number] DOB: Jun 15, 1950 Today's Date: 06/07/2022   History of Present Illness 72 yo female admitted 1/14 after stepping in hole with Rt femur fx, non-op, NWB. s/p ORIF right tibial plateau and anterior compartment fasciotomy 06/03/22. PMhx: chronic non-healing Rt femur fx, obesity, COPD, HTN, HLD, DM, neuropathy, bipolar    PT Comments    Pt progressing slowly towards physical therapy goals. Pt adamant that she will be returning home at d/c however is not able to verbalize consistent help that will be there or a safe living situation. Pt describing placing 2x4's over the holes in the floor. Continue to recommend SNF level rehab at d/c to maximize functional independence and safety. At this time pt requires +2 total assist for any attempts at [place] with therapy, and is appropriate for a maximove lift to/from chair with nursing staff. Will continue to follow.    Recommendations for follow up therapy are one component of a multi-disciplinary discharge planning process, led by the attending physician.  Recommendations may be updated based on patient status, additional functional criteria and insurance authorization.  Follow Up Recommendations  Skilled nursing-short term rehab (<3 hours/day) Can patient physically be transported by private vehicle: No   Assistance Recommended at Discharge Frequent or constant Supervision/Assistance  Patient can return home with the following Two people to help with walking and/or transfers;A lot of help with bathing/dressing/bathroom;Assistance with cooking/housework;Direct supervision/assist for medications management;Assist for transportation;Assistance with feeding;Help with stairs or ramp for entrance   Equipment Recommendations  Hospital bed;Wheelchair (measurements PT);Wheelchair cushion (measurements PT)    Recommendations for Other Services       Precautions / Restrictions  Precautions Precautions: Fall Required Braces or Orthoses: Knee Immobilizer - Right Knee Immobilizer - Right: On at all times (Not present in room however noted in chart) Restrictions Weight Bearing Restrictions: Yes RLE Weight Bearing: Non weight bearing     Mobility  Bed Mobility Overal bed mobility: Needs Assistance Bed Mobility: Supine to Sit Rolling: Min guard, Max assist   Supine to sit: +2 for physical assistance, HOB elevated, Mod assist     General bed mobility comments: Pt able to roll R with min guard assist and L with +2 assist. Increased time to scoot out to EOB and return to supine with heavy use of rail and bed pad.    Transfers Overall transfer level: Needs assistance Equipment used: Rolling walker (2 wheels) Transfers: Bed to chair/wheelchair/BSC, Sit to/from Stand Sit to Stand: Total assist, +2 physical assistance          Lateral/Scoot Transfers: +2 physical assistance, Total assist General transfer comment: Pt wanting to attempt standing to pivot to the chair. Unable with +2 total assist. Pt unable to scoot laterally this session without +2 total assist and use of bed pad. Efforts terminated due to decreased safety. Recommend lift OOB to chair at this time.    Ambulation/Gait               General Gait Details: Unable   Stairs             Wheelchair Mobility    Modified Rankin (Stroke Patients Only)       Balance Overall balance assessment: Needs assistance Sitting-balance support: Feet supported, Single extremity supported Sitting balance-Leahy Scale: Fair Sitting balance - Comments: Supervision for safety.       Standing balance comment: Unable  Cognition Arousal/Alertness: Awake/alert Behavior During Therapy: WFL for tasks assessed/performed Overall Cognitive Status: Within Functional Limits for tasks assessed Area of Impairment: Problem solving, Following commands,  Safety/judgement                       Following Commands: Follows one step commands inconsistently, Follows one step commands with increased time Safety/Judgement: Decreased awareness of safety, Decreased awareness of deficits   Problem Solving: Slow processing, Requires verbal cues, Difficulty sequencing General Comments: seems appropriate for functional mobility today, needs repetition of cues at times. Tangential but redirectable.        Exercises      General Comments        Pertinent Vitals/Pain Pain Assessment Pain Assessment: Faces Faces Pain Scale: Hurts even more Breathing: normal Negative Vocalization: none Pain Location: RLE with movement Pain Descriptors / Indicators: Sore, Operative site guarding, Grimacing Pain Intervention(s): Limited activity within patient's tolerance, Monitored during session, Repositioned    Home Living                          Prior Function            PT Goals (current goals can now be found in the care plan section) Acute Rehab PT Goals Patient Stated Goal: return home PT Goal Formulation: With patient Time For Goal Achievement: 06/15/22 Potential to Achieve Goals: Fair Progress towards PT goals: Progressing toward goals    Frequency    Min 3X/week      PT Plan Current plan remains appropriate;Frequency needs to be updated    Co-evaluation              AM-PAC PT "6 Clicks" Mobility   Outcome Measure  Help needed turning from your back to your side while in a flat bed without using bedrails?: A Lot Help needed moving from lying on your back to sitting on the side of a flat bed without using bedrails?: Total Help needed moving to and from a bed to a chair (including a wheelchair)?: Total Help needed standing up from a chair using your arms (e.g., wheelchair or bedside chair)?: Total Help needed to walk in hospital room?: Total Help needed climbing 3-5 steps with a railing? : Total 6 Click  Score: 7    End of Session Equipment Utilized During Treatment: Gait belt Activity Tolerance: Patient tolerated treatment well Patient left: in chair;with call bell/phone within reach;with chair alarm set Nurse Communication: Mobility status;Need for lift equipment PT Visit Diagnosis: Other abnormalities of gait and mobility (R26.89);Muscle weakness (generalized) (M62.81);Repeated falls (R29.6);Pain Pain - Right/Left: Right Pain - part of body: Leg     Time: 9211-9417 PT Time Calculation (min) (ACUTE ONLY): 30 min  Charges:  $Therapeutic Activity: 23-37 mins                     [name] [name], PT, DPT Acute Rehabilitation Services Secure Chat Preferred Office: [phone]    Thelma Comp 06/07/2022, 4:29 PM

## 2022-06-07 NOTE — TOC Transition Note (Signed)
Transition of Care [facility]) - CM/SW Discharge Note   Patient Details  Name: [name] [name] MRN: [id number] Date of Birth: [birth year]  [facility]) CM/SW Contact:  Curlene Labrum, RN Phone Number: 06/07/2022, 11:03 AM   Clinical Narrative:    CM met with the patient at the bedside to discuss SNf options.  The patient has no current bed offers at this time.  Her insurance provider is in network with a few facilities in the area including [facility] care, [facility] in [place] and [facility] [place].  [facility] and declined bed offer to the patient stating that insurance provide is not in net work.  I called the sister facility in [place] and [place], CM with admission is willing to review the clinicals for potential bed offer.  Clinicals were emailed to the facility at [email or web address]   Final next level of care: Skilled Nursing Facility Barriers to Discharge: Continued Medical Work up, SNF Pending bed offer   Patient Goals and CMS Choice CMS Medicare.gov Compare Post Acute Care list provided to:: Patient Choice offered to / list presented to : Patient  Discharge Placement                         Discharge Plan and Services Additional resources added to the After Visit Summary for     Discharge Planning Services: CM Consult Post Acute Care Choice: [place]                               Social Determinants of Health (SDOH) Interventions SDOH Screenings   Food Insecurity: Food Insecurity Present (05/31/2022)  Housing: Low Risk  (05/31/2022)  Transportation Needs: Unmet Transportation Needs (05/31/2022)  Utilities: At Risk (05/31/2022)  Tobacco Use: Medium Risk (06/04/2022)     Readmission Risk Interventions    06/01/2022   12:03 PM  Readmission Risk Prevention Plan  Post Dischage Appt Complete  Medication Screening Complete  Transportation Screening Complete

## 2022-06-08 DIAGNOSIS — M978XXA Periprosthetic fracture around other internal prosthetic joint, initial encounter: Secondary | ICD-10-CM | POA: Diagnosis not present

## 2022-06-08 DIAGNOSIS — Z96649 Presence of unspecified artificial hip joint: Secondary | ICD-10-CM | POA: Diagnosis not present

## 2022-06-08 LAB — GLUCOSE, CAPILLARY
Glucose-Capillary: 113 mg/dL — ABNORMAL HIGH (ref 70–99)
Glucose-Capillary: 96 mg/dL (ref 70–99)
Glucose-Capillary: 130 mg/dL — ABNORMAL HIGH (ref 70–99)
Glucose-Capillary: 110 mg/dL — ABNORMAL HIGH (ref 70–99)

## 2022-06-08 MED ORDER — OXYCODONE HCL 5 MG PO TABS
5.0000 mg | ORAL_TABLET | ORAL | Status: DC | PRN
  Administered 2022-06-09: 5 mg via ORAL
  Filled 2022-06-08: qty 1

## 2022-06-08 MED ORDER — TRAMADOL HCL 50 MG PO TABS: 50.0000 mg | ORAL_TABLET | Freq: Four times a day (QID) | ORAL | Status: DC | PRN

## 2022-06-08 NOTE — TOC Progression Note (Addendum)
Transition of Care [facility]) - Progression Note    Patient Details  Name: Leonardo Plaia Ahuja MRN: [id number] Date of Birth: March 01, 1951  Transition of Care [facility]) CM/SW [place], RN Phone Number: 06/08/2022, 2:02 PM  Clinical Narrative:    CM called and left a voicemail message with Magda Paganini, [place] at [facility].  I called and spoke with Amber, CM at [facility] and she states that she called [facility] this morning and requested review of patient's clinicals for hopeful bed offer.  Rosanna, CM at [employer] with [employer] Medicare policy and she states that she will call the facility ([facility]) as well to discuss possible bed offer and assistance - [phone].  06/08/22 - CM received message from [place], [place] with [facility] and [place], [place] with [facility] will call and follow up with her regarding possible bed offer and start of insurance authorization if bed offer is made - pending decision at this time.   Expected Discharge Plan: [place] Barriers to Discharge: Continued Medical Work up, SNF Pending bed offer  Expected Discharge Plan and Services   Discharge Planning Services: CM Consult Post Acute Care Choice: [place] arrangements for the past 2 months: Mobile Home                                       Social Determinants of Health (SDOH) Interventions SDOH Screenings   Food Insecurity: Food Insecurity Present (05/31/2022)  Housing: Low Risk  (05/31/2022)  Transportation Needs: Unmet Transportation Needs (05/31/2022)  Utilities: At Risk (05/31/2022)  Tobacco Use: Medium Risk (06/04/2022)    Readmission Risk Interventions    06/01/2022   12:03 PM  Readmission Risk Prevention Plan  Post Dischage Appt Complete  Medication Screening Complete  Transportation Screening Complete

## 2022-06-08 NOTE — Progress Notes (Signed)
Occupational Therapy Treatment Patient Details Name: [name] [name] MRN: [id number] DOB: Jan 18, 1951 Today's Date: 06/08/2022   History of present illness 72 yo female admitted 1/14 after stepping in hole with Rt femur fx, non-op, NWB. s/p ORIF right tibial plateau and anterior compartment fasciotomy 06/03/22. PMhx: chronic non-healing Rt femur fx, obesity, COPD, HTN, HLD, DM, neuropathy, bipolar   OT comments  Planned to attempt sliding board transfers this AM as lateral scoot transfers difficult in prior sessions. Pt requires Mod A x 2 for bed mobility to sit EOB though noted w/ purewick malfunction resulting in wet linens. Upon return to bed for replacement of bed pad, pt noted w/ bowel incontinence as well and requesting to use bed pan so unable to progress OOB today. Continue to rec SNF rehab at [facility] and appears pt is agreeable for this plan.    Recommendations for follow up therapy are one component of a multi-disciplinary discharge planning process, led by the attending physician.  Recommendations may be updated based on patient status, additional functional criteria and insurance authorization.    Follow Up Recommendations  Skilled nursing-short term rehab (<3 hours/day)     Assistance Recommended at Discharge Intermittent Supervision/Assistance  Patient can return home with the following  Two people to help with walking and/or transfers;A lot of help with bathing/dressing/bathroom;Assist for transportation;Help with stairs or ramp for entrance;Assistance with Education officer, environmental cushion (measurements OT);Wheelchair (measurements OT);Other (comment) (TBD)    Recommendations for Other Services      Precautions / Restrictions Precautions Precautions: Fall Restrictions Weight Bearing Restrictions: Yes RLE Weight Bearing: Non weight bearing Other Position/Activity Restrictions: unrestricted ROM       Mobility Bed Mobility Overal bed mobility:  Needs Assistance Bed Mobility: Supine to Sit, Sit to Supine, Rolling Rolling: Mod assist   Supine to sit: Mod assist, +2 for safety/equipment, HOB elevated Sit to supine: Mod assist, +2 for safety/equipment   General bed mobility comments: assist for RLE to/from EOB and to lift trunk with frequent LOB backwards requiring overall Min A to correct. Mod A x 2 for back to bed to change wet bedpad    Transfers                   General transfer comment: unable to progress d/t incontinence and need for bedpan use     Balance Overall balance assessment: Needs assistance Sitting-balance support: Feet supported, Single extremity supported Sitting balance-Leahy Scale: Poor Sitting balance - Comments: initial posterior LOB progressing to supervision Postural control: Posterior lean                                 ADL either performed or assessed with clinical judgement   ADL Overall ADL's : Needs assistance/impaired                             Toileting- Clothing Manipulation and Hygiene: Total assistance;Bed level Toileting - Clothing Manipulation Details (indicate cue type and reason): noted purewick malfunction and bed wet. when rolling to assist with new pad, pt noted with bowel incontinence as well. placed on bedpan as pt reports she feels like she may still need to go            Extremity/Trunk Assessment Upper Extremity Assessment Upper Extremity Assessment: Generalized weakness   Lower Extremity Assessment Lower Extremity Assessment: Defer to PT evaluation  Vision   Vision Assessment?: No apparent visual deficits   Perception     Praxis      Cognition Arousal/Alertness: Awake/alert Behavior During Therapy: WFL for tasks assessed/performed, Restless Overall Cognitive Status: No family/caregiver present to determine baseline cognitive functioning Area of Impairment: Problem solving, Following commands, Safety/judgement,  Attention                   Current Attention Level: Selective   Following Commands: Follows one step commands inconsistently, Follows one step commands with increased time Safety/Judgement: Decreased awareness of safety, Decreased awareness of deficits   Problem Solving: Slow processing, Requires verbal cues, Difficulty sequencing General Comments: cues for redirection, tangential comments but pleasant and appropriate in conversation.        Exercises      Shoulder Instructions       General Comments      Pertinent Vitals/ Pain       Pain Assessment Pain Assessment: Faces Faces Pain Scale: Hurts little more Pain Location: RLE with movement Pain Descriptors / Indicators: Sore Pain Intervention(s): Monitored during session, Limited activity within patient's tolerance, Premedicated before session  Home Living                                          Prior Functioning/Environment              Frequency  Min 2X/week        Progress Toward Goals  OT Goals(current goals can now be found in the care plan section)  Progress towards OT goals: OT to reassess next treatment  Acute Rehab OT Goals Patient Stated Goal: go to rehab OT Goal Formulation: With patient Time For Goal Achievement: 06/18/22 Potential to Achieve Goals: Good ADL Goals Pt Will Perform Lower Body Bathing: with mod assist;sitting/lateral leans;with adaptive equipment Pt Will Perform Lower Body Dressing: with mod assist;sitting/lateral leans;with adaptive equipment Pt Will Transfer to Toilet: with mod assist;with transfer board;bedside commode Pt Will Perform Toileting - Clothing Manipulation and hygiene: with mod assist;sitting/lateral leans;with adaptive equipment Pt/caregiver will Perform Home Exercise Program: Increased strength;Both right and left upper extremity;With theraband;With written HEP provided;Independently  Plan Discharge plan remains appropriate     Co-evaluation                 AM-PAC OT "6 Clicks" Daily Activity     Outcome Measure   Help from another person eating meals?: A Little Help from another person taking care of personal grooming?: A Little Help from another person toileting, which includes using toliet, bedpan, or urinal?: Total Help from another person bathing (including washing, rinsing, drying)?: A Lot Help from another person to put on and taking off regular upper body clothing?: A Little Help from another person to put on and taking off regular lower body clothing?: Total 6 Click Score: 13    End of Session    OT Visit Diagnosis: Unsteadiness on feet (R26.81);Other abnormalities of gait and mobility (R26.89);Repeated falls (R29.6);Muscle weakness (generalized) (M62.81);History of falling (Z91.81);Pain Pain - Right/Left: Right Pain - part of body: Leg   Activity Tolerance Patient tolerated treatment well   Patient Left in bed;with call bell/phone within reach;with bed alarm set   Nurse Communication Mobility status;Other (comment) (on bedpan)        Time: 1030-1047 OT Time Calculation (min): 17 min  Charges: OT General Charges $OT Visit: 1 Visit  OT Treatments $Self Care/Home Management : 8-22 mins  Malachy Chamber, OTR/L Acute Rehab Services Office: [phone]   Layla Maw 06/08/2022, 11:22 AM

## 2022-06-08 NOTE — Progress Notes (Signed)
[name] [name]  VEL:[id number] DOB: 1951-04-10 DOA: 05/30/2022 PCP: Aletha Halim., PA-C    Brief Narrative:  72 year old with a history of frequent falls, COPD, HTN, HLD, DM2 with neuropathy, and chronic right femur fracture since 2017 who sustained a mechanical fall onto her right knee and presented to the hospital 05/30/2022.  X-ray studies revealed a nondisplaced large transverse fracture of the proximal tibia as well as femoral neck and chronic nonhealing femoral shaft fractures.  Orthopedics evaluated the patient and recommended a knee immobilizer with nonweightbearing status of the right leg.  Ultimately she was taken to the OR 06/03/2022 for ORIF of her proximal tibia fracture due to persistent uncontrolled pain.  She is presently awaiting SNF placement.  Consultants:  Orthopedic Surgery  Goals of Care:  Code Status: DNR   DVT prophylaxis: Lovenox  Interim Hx: Awaiting SNF placement.  No bed offers at present.  No acute events recorded overnight.  Resting comfortably in bed.  In good spirits.  Denies chest pain or shortness of breath.  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  Family Communication: No family present at time of exam Disposition: From home - needs SNF rehab stay - medically stable for SNF  when bed available   Objective: Blood pressure (!) 146/83, pulse 80, temperature 99.1 F (37.3 C), temperature source Oral, resp. rate 18, height '5\' 1"'$  (1.549 m), weight (!) 136.1 kg, SpO2 96 %.  Intake/Output Summary (Last 24 hours) at 06/08/2022 0826 Last data filed at 06/08/2022 0531 Gross per 24 hour  Intake 480 ml  Output 3300 ml  Net -2820 ml    Filed Weights   05/30/22 1924  Weight: (!) 136.1 kg    Examination: General: No acute respiratory distress Lungs: Clear to auscultation bilaterally -no wheezing Cardiovascular: Regular rate and rhythm without murmur Abdomen: NT/ND, soft, bowel sounds positive, no rebound Extremities: Trace right lower extremity edema without cyanosis or clubbing  CBC: Recent Labs  Lab 06/05/22 0751 06/06/22 0456 06/07/22 0411  WBC 11.5* 9.5 8.6  HGB 9.8* 9.6* 9.9*  HCT 31.6* 30.3* 30.9*  MCV 94.3 94.1 93.9  PLT 366 343 258    Basic Metabolic Panel: Recent Labs  Lab 06/02/22 0645 06/03/22 0404 06/04/22 0617 06/05/22 0751 06/06/22 0456 06/07/22 0411  NA 138 137 136 137 135 135  K 4.2 4.0 4.8 4.2 4.4 3.9  CL 102 102 96* 101 97* 99  CO2 '29 28 28 28 30 29  '$ GLUCOSE 105* 114* 150* 107* 103* 108*  BUN '16 15 14 21 23 19  '$ CREATININE 0.97 0.94 0.89 1.02* 1.08* 1.04*  CALCIUM 9.4 9.2 9.3 9.0 9.2 8.8*  MG 1.6* 2.0 1.9  --   --   --     GFR: Estimated Creatinine Clearance: 63.2 mL/min (A) (by C-G formula based on SCr of 1.04 mg/dL (H)).  Scheduled Meds:  acetaminophen  650 mg Oral Q4H   cholecalciferol  1,000 Units Oral Daily   enoxaparin (LOVENOX) injection  70 mg Subcutaneous Q24H   [START ON 06/09/2022] ferrous sulfate  650 mg Oral Daily   gabapentin  1,200 mg Oral QHS   gabapentin  600 mg Oral Daily   insulin aspart  0-5 Units Subcutaneous QHS   insulin aspart  0-9 Units Subcutaneous TID WC   levothyroxine  25 mcg Oral QAC breakfast   metFORMIN  500 mg Oral BID WC   pantoprazole  40 mg Oral Daily   polyethylene glycol  17  g Oral Daily   senna-docusate  2 tablet Oral BID     LOS: 8 days   Cherene Altes, MD [facility] Office  [phone] Pager - Text Page per Shea Evans  If 7PM-7AM, please contact night-coverage per Amion 06/08/2022, 8:26 AM

## 2022-06-08 NOTE — Discharge Instructions (Addendum)
Orthopaedic Trauma Service Discharge Instructions   General Discharge Instructions  Orthopaedic Injuries:  Right proximal tibia fracture treated with open reduction and internal fixation using plate and screws   WEIGHT BEARING STATUS: Nonweightbearing right leg   RANGE OF MOTION/ACTIVITY:unrestricted ROM as tolerated R knee. Activity as tolerated while maintaining weightbearing restrictions as noted above   Bone health: labs show vitamin d insufficiency. Take 5000 IUs vitamin d3 daily   Review the following resource for additional information regarding bone health  [email or web address]  Wound Care: daily wound care starting now. Cover incision with 4x4 gauze and tape.  Once there is no drainage leave open to air.  Ok to shower and clean with soap and water only   DVT/PE prophylaxis: Lovenox daily x 4 weeks   Diet: as you were eating previously.  Can use over the counter stool softeners and bowel preparations, such as Miralax, to help with bowel movements.  Narcotics can be constipating.  Be sure to drink plenty of fluids  PAIN MEDICATION USE AND EXPECTATIONS  You have likely been given narcotic medications to help control your pain.  After a traumatic event that results in an fracture (broken bone) with or without surgery, it is ok to use narcotic pain medications to help control one's pain.  We understand that everyone responds to pain differently and each individual patient will be evaluated on a regular basis for the continued need for narcotic medications. Ideally, narcotic medication use should last no more than 6-8 weeks (coinciding with fracture healing).   As a patient it is your responsibility as well to monitor narcotic medication use and report the amount and frequency you use these medications when you come to your office visit.   We would also advise that if you are using narcotic medications, you should take a dose prior to therapy to maximize you  participation.  IF YOU ARE ON NARCOTIC MEDICATIONS IT IS NOT PERMISSIBLE TO OPERATE A MOTOR VEHICLE (MOTORCYCLE/CAR/TRUCK/MOPED) OR HEAVY MACHINERY DO NOT MIX NARCOTICS WITH OTHER CNS (CENTRAL NERVOUS SYSTEM) DEPRESSANTS SUCH AS ALCOHOL   POST-OPERATIVE OPIOID TAPER INSTRUCTIONS: It is important to wean off of your opioid medication as soon as possible. If you do not need pain medication after your surgery it is ok to stop day one. Opioids include: Codeine, Hydrocodone(Norco, Vicodin), Oxycodone(Percocet, oxycontin) and hydromorphone amongst others.  Long term and even short term use of opiods can cause: Increased pain response Dependence Constipation Depression Respiratory depression And more.  Withdrawal symptoms can include Flu like symptoms Nausea, vomiting And more Techniques to manage these symptoms Hydrate well Eat regular healthy meals Stay active Use relaxation techniques(deep breathing, meditating, yoga) Do Not substitute Alcohol to help with tapering If you have been on opioids for less than two weeks and do not have pain than it is ok to stop all together.  Plan to wean off of opioids This plan should start within one week post op of your fracture surgery  Maintain the same interval or time between taking each dose and first decrease the dose.  Cut the total daily intake of opioids by one tablet each day Next start to increase the time between doses. The last dose that should be eliminated is the evening dose.    STOP SMOKING OR USING NICOTINE PRODUCTS!!!!  As discussed nicotine severely impairs your body's ability to heal surgical and traumatic wounds but also impairs bone healing.  Wounds and bone heal by forming microscopic blood vessels (angiogenesis) and nicotine is a  vasoconstrictor (essentially, shrinks blood vessels).  Therefore, if vasoconstriction occurs to these microscopic blood vessels they essentially disappear and are unable to deliver necessary  nutrients to the healing tissue.  This is one modifiable factor that you can do to dramatically increase your chances of healing your injury.    (This means no smoking, no nicotine gum, patches, etc)  DO NOT USE NONSTEROIDAL ANTI-INFLAMMATORY DRUGS (NSAID'S)  Using products such as Advil (ibuprofen), Aleve (naproxen), Motrin (ibuprofen) for additional pain control during fracture healing can delay and/or prevent the healing response.  If you would like to take over the counter (OTC) medication, Tylenol (acetaminophen) is ok.  However, some narcotic medications that are given for pain control contain acetaminophen as well. Therefore, you should not exceed more than 4000 mg of tylenol in a day if you do not have liver disease.  Also note that there are may OTC medicines, such as cold medicines and allergy medicines that my contain tylenol as well.  If you have any questions about medications and/or interactions please ask your doctor/PA or your pharmacist.      ICE AND ELEVATE INJURED/OPERATIVE EXTREMITY  Using ice and elevating the injured extremity above your heart can help with swelling and pain control.  Icing in a pulsatile fashion, such as 20 minutes on and 20 minutes off, can be followed.    Do not place ice directly on skin. Make sure there is a barrier between to skin and the ice pack.    Using frozen items such as frozen peas works well as the conform nicely to the are that needs to be iced.  USE AN ACE WRAP OR TED HOSE FOR SWELLING CONTROL  In addition to icing and elevation, Ace wraps or TED hose are used to help limit and resolve swelling.  It is recommended to use Ace wraps or TED hose until you are informed to stop.    When using Ace Wraps start the wrapping distally (farthest away from the body) and wrap proximally (closer to the body)   Example: If you had surgery on your leg or thing and you do not have a splint on, start the ace wrap at the toes and work your way up to the thigh         If you had surgery on your upper extremity and do not have a splint on, start the ace wrap at your fingers and work your way up to the upper arm  IF YOU ARE IN A SPLINT OR CAST DO NOT [place]   If your splint gets wet for any reason please contact the office immediately. You may shower in your splint or cast as long as you keep it dry.  This can be done by wrapping in a cast cover or garbage back (or similar)  Do Not stick any thing down your splint or cast such as pencils, money, or hangers to try and scratch yourself with.  If you feel itchy take benadryl as prescribed on the bottle for itching  IF YOU ARE IN A CAM BOOT (BLACK BOOT)  You may remove boot periodically. Perform daily dressing changes as noted below.  Wash the liner of the boot regularly and wear a sock when wearing the boot. It is recommended that you sleep in the boot until told otherwise    Call office for the following: Temperature greater than 101F Persistent nausea and vomiting Severe uncontrolled pain Redness, tenderness, or signs of infection (pain, swelling,  redness, odor or green/yellow discharge around the site) Difficulty breathing, headache or visual disturbances Hives Persistent dizziness or light-headedness Extreme fatigue Any other questions or concerns you may have after discharge  In an emergency, call 911 or go to an Emergency Department at a nearby hospital  HELPFUL INFORMATION  If you had a block, it will wear off between 8-24 hrs postop typically.  This is period when your pain may go from nearly zero to the pain you would have had postop without the block.  This is an abrupt transition but nothing dangerous is happening.  You may take an extra dose of narcotic when this happens.  You should wean off your narcotic medicines as soon as you are able.  Most patients will be off or using minimal narcotics before their first postop appointment.   We suggest you use the pain medication  the first night prior to going to bed, in order to ease any pain when the anesthesia wears off. You should avoid taking pain medications on an empty stomach as it will make you nauseous.  Do not drink alcoholic beverages or take illicit drugs when taking pain medications.  In most states it is against the law to drive while you are in a splint or sling.  And certainly against the law to drive while taking narcotics.  You may return to work/school in the next couple of days when you feel up to it.   Pain medication may make you constipated.  Below are a few solutions to try in this order: Decrease the amount of pain medication if you aren't having pain. Drink lots of decaffeinated fluids. Drink prune juice and/or each dried prunes  If the first 3 don't work start with additional solutions Take Colace - an over-the-counter stool softener Take Senokot - an over-the-counter laxative Take Miralax - a stronger over-the-counter laxative     CALL THE OFFICE WITH ANY QUESTIONS OR CONCERNS: [phone]   VISIT OUR WEBSITE FOR ADDITIONAL INFORMATION: [email or web address]

## 2022-06-09 DIAGNOSIS — W19XXXA Unspecified fall, initial encounter: Secondary | ICD-10-CM | POA: Diagnosis not present

## 2022-06-09 DIAGNOSIS — M978XXA Periprosthetic fracture around other internal prosthetic joint, initial encounter: Secondary | ICD-10-CM | POA: Diagnosis not present

## 2022-06-09 DIAGNOSIS — Z96649 Presence of unspecified artificial hip joint: Secondary | ICD-10-CM | POA: Diagnosis not present

## 2022-06-09 LAB — GLUCOSE, CAPILLARY
Glucose-Capillary: 101 mg/dL — ABNORMAL HIGH (ref 70–99)
Glucose-Capillary: 110 mg/dL — ABNORMAL HIGH (ref 70–99)

## 2022-06-09 MED ORDER — ENOXAPARIN SODIUM 80 MG/0.8ML IJ SOSY: 70.0000 mg | PREFILLED_SYRINGE | INTRAMUSCULAR | 0 refills | Status: DC

## 2022-06-09 MED ORDER — ACETAMINOPHEN 325 MG PO TABS: 650.0000 mg | ORAL_TABLET | ORAL | 0 refills | Status: DC | PRN

## 2022-06-09 MED ORDER — SENNOSIDES-DOCUSATE SODIUM 8.6-50 MG PO TABS: 2.0000 | ORAL_TABLET | Freq: Two times a day (BID) | ORAL | 0 refills | Status: DC

## 2022-06-09 MED ORDER — BISACODYL 10 MG RE SUPP: 10.0000 mg | Freq: Every day | RECTAL | 0 refills | Status: DC | PRN

## 2022-06-09 MED ORDER — POLYETHYLENE GLYCOL 3350 17 G PO PACK: 17.0000 g | PACK | Freq: Every day | ORAL | 0 refills | Status: DC

## 2022-06-09 NOTE — TOC Progression Note (Addendum)
Transition of Care [facility]) - Progression Note    Patient Details  Name: [name] [name] MRN: [id number] Date of Birth: 12-01-1950  Transition [facility] Care [facility]) CM/SW [place], RN Phone Number: 06/09/2022, 11:14 AM  Clinical Narrative:    CM called and spoke with Donato Schultz, business office with [employer] and she is checking on insurance authorization and bed availability for possible admission to the facility today/ tomorrow once obtained.  I reported to [facility] that the patient would need new wheelchair prior to discharge to home from the facility.  The patient states that she does not use her CPAP mask/machine at home in years because she is unable to sleep with the mask on her facility.  The patient is currently on room air.  Barriers to discharge includes firm bed offer and start of insurance authorization from the provider.  The patient's insurance plan is only in contract with 2 facilities in the area including [facility] in [place], [place] and [facility] in [place].  06/09/22 1334 - CM spoke with [name], CM for admissions at [facility] and they have accepted the patient for admission to the facility today.  Attending MD and patient were updated.  Discharge summary to be updated in the hub for the facility.  [facility]AR to be arranged for transportation to the facility.  06/09/2021 1345 - Bedside nursing please call report to [facility] at [phone].   Expected Discharge Plan: [place] Barriers to Discharge: Continued Medical Work up, SNF Pending bed offer  Expected Discharge Plan and Services   Discharge Planning Services: CM Consult Post Acute Care Choice: [place] arrangements for the past 2 months: Mobile Home                                       Social Determinants of Health (SDOH) Interventions SDOH Screenings   Food Insecurity: Food Insecurity Present  (05/31/2022)  Housing: Low Risk  (05/31/2022)  Transportation Needs: Unmet Transportation Needs (05/31/2022)  Utilities: At Risk (05/31/2022)  Tobacco Use: Medium Risk (06/04/2022)    Readmission Risk Interventions    06/01/2022   12:03 PM  Readmission Risk Prevention Plan  Post Dischage Appt Complete  Medication Screening Complete  Transportation Screening Complete

## 2022-06-09 NOTE — Progress Notes (Signed)
Attempted to call report to [facility], no answer from the nursing station. Voicemail left for coordinator with return callback number.

## 2022-06-09 NOTE — Hospital Course (Signed)
72 year old with a history of frequent falls, COPD, HTN, HLD, DM2 with neuropathy, and chronic right femur fracture since 2017 who sustained a mechanical fall onto her right knee and presented to the hospital 05/30/2022. X-ray studies revealed a nondisplaced large transverse fracture of the proximal tibia as well as femoral neck and chronic nonhealing femoral shaft fractures. Orthopedics evaluated the patient and recommended a knee immobilizer with nonweightbearing status of the right leg. Ultimately she was taken to the OR 06/03/2022 for ORIF of her proximal tibia fracture due to persistent uncontrolled pain.

## 2022-06-09 NOTE — Plan of Care (Signed)

## 2022-06-09 NOTE — Progress Notes (Signed)
Physical Therapy Treatment Patient Details Name: [name] [name] MRN: [id number] DOB: 09-29-1950 Today's Date: 06/09/2022   History of Present Illness 72 yo female admitted 1/14 after stepping in hole with Rt femur fx, non-op, NWB. s/p ORIF right tibial plateau and anterior compartment fasciotomy 06/03/22. PMhx: chronic non-healing Rt femur fx, obesity, COPD, HTN, HLD, DM, neuropathy, bipolar    PT Comments    Pt participating well in PT this session, sitting EOB x10 minutes and overall requiring mod-max +2 for moving to/from EOB. Pt additionally tolerating RLE AAROM exercise well, although has limited overall tolerance for activity. Pt would continue to benefit from SNF level of care post-acutely.      Recommendations for follow up therapy are one component of a multi-disciplinary discharge planning process, led by the attending physician.  Recommendations may be updated based on patient status, additional functional criteria and insurance authorization.  Follow Up Recommendations  Skilled nursing-short term rehab (<3 hours/day) Can patient physically be transported by private vehicle: No   Assistance Recommended at Discharge Frequent or constant Supervision/Assistance  Patient can return home with the following Two people to help with walking and/or transfers;A lot of help with bathing/dressing/bathroom;Assistance with cooking/housework;Direct supervision/assist for medications management;Assist for transportation;Assistance with feeding;Help with stairs or ramp for entrance   Equipment Recommendations  Other (comment) (defer)    Recommendations for Other Services       Precautions / Restrictions Precautions Precautions: Fall Restrictions Weight Bearing Restrictions: Yes RLE Weight Bearing: Non weight bearing Other Position/Activity Restrictions: unrestricted ROM     Mobility  Bed Mobility Overal bed mobility: Needs Assistance Bed Mobility: Supine to Sit, Sit to Supine,  Rolling Rolling: Mod assist   Supine to sit: Mod assist Sit to supine: Max assist, +2 for physical assistance   General bed mobility comments: assist for trunk and LE management, scooting to/from EOB ,and boost up upon return to supine with pt using UEs to assist    Transfers                   General transfer comment: scoot EOB attempted, max +2 for lateral scoot with pt unable to move buttocks without significant pad lift    Ambulation/Gait                   Stairs             Wheelchair Mobility    Modified Rankin (Stroke Patients Only)       Balance Overall balance assessment: Needs assistance Sitting-balance support: Feet supported, Single extremity supported Sitting balance-Leahy Scale: Fair   Postural control: Posterior lean                                  Cognition Arousal/Alertness: Awake/alert Behavior During Therapy: WFL for tasks assessed/performed, Restless Overall Cognitive Status: No family/caregiver present to determine baseline cognitive functioning Area of Impairment: Problem solving, Following commands, Safety/judgement, Attention                   Current Attention Level: Selective   Following Commands: Follows one step commands inconsistently, Follows one step commands with increased time Safety/Judgement: Decreased awareness of safety, Decreased awareness of deficits   Problem Solving: Slow processing, Requires verbal cues, Difficulty sequencing          Exercises General Exercises - Lower Extremity Long Arc Quad: AAROM, Both, 10 reps, Seated    General Comments  Pertinent Vitals/Pain Pain Assessment Pain Assessment: Faces Faces Pain Scale: Hurts even more Pain Location: RLE with movement Pain Descriptors / Indicators: Sore Pain Intervention(s): Limited activity within patient's tolerance, Monitored during session, Repositioned    Home Living                           Prior Function            PT Goals (current goals can now be found in the care plan section) Acute Rehab PT Goals Patient Stated Goal: return home PT Goal Formulation: With patient Time For Goal Achievement: 06/15/22 Potential to Achieve Goals: Fair Progress towards PT goals: Progressing toward goals    Frequency    Min 3X/week      PT Plan Current plan remains appropriate    Co-evaluation              AM-PAC PT "6 Clicks" Mobility   Outcome Measure  Help needed turning from your back to your side while in a flat bed without using bedrails?: A Lot Help needed moving from lying on your back to sitting on the side of a flat bed without using bedrails?: A Lot Help needed moving to and from a bed to a chair (including a wheelchair)?: Total Help needed standing up from a chair using your arms (e.g., wheelchair or bedside chair)?: Total Help needed to walk in hospital room?: Total Help needed climbing 3-5 steps with a railing? : Total 6 Click Score: 8    End of Session   Activity Tolerance: Patient tolerated treatment well Patient left: in bed;with call bell/phone within reach;with bed alarm set;with nursing/sitter in room Nurse Communication: Mobility status PT Visit Diagnosis: Other abnormalities of gait and mobility (R26.89);Muscle weakness (generalized) (M62.81);Repeated falls (R29.6);Pain Pain - Right/Left: Right Pain - part of body: Leg     Time: 4709-6283 PT Time Calculation (min) (ACUTE ONLY): 21 min  Charges:  $Therapeutic Activity: 8-22 mins                    Stacie Glaze, PT DPT Acute Rehabilitation Services Pager [phone]  Office [phone]   [name] 06/09/2022, 1:46 PM

## 2022-06-09 NOTE — Discharge Summary (Signed)
Physician Discharge Summary   Patient: [name] [name] MRN: [id number] DOB: Oct 25, 1950  Admit date:     05/30/2022  Discharge date: 06/09/22  Discharge Physician: Marylu Lund   PCP: Aletha Halim., PA-C   Recommendations at discharge:    Follow up with PCP in 1-2 weeks Follow up with Orthopedic Surgery as scheduled  Discharge Diagnoses: Principal Problem:   Periprosthetic fracture of shaft of femur Active Problems:   Acute blood loss anemia   Morbid obesity with BMI of 45.0-49.9, adult (HCC)   COPD (chronic obstructive pulmonary disease) (HCC)   Closed right tibial fracture  Resolved Problems:   * No resolved hospital problems. [facility] Course: 72 year old with a history of frequent falls, COPD, HTN, HLD, DM2 with neuropathy, and chronic right femur fracture since 2017 who sustained a mechanical fall onto her right knee and presented to the hospital 05/30/2022. X-ray studies revealed a nondisplaced large transverse fracture of the proximal tibia as well as femoral neck and chronic nonhealing femoral shaft fractures. Orthopedics evaluated the patient and recommended a knee immobilizer with nonweightbearing status of the right leg. Ultimately she was taken to the OR 06/03/2022 for ORIF of her proximal tibia fracture due to persistent uncontrolled pain.   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       Consultants: Orthopedic Surgery Procedures performed: ORIF 1/18  Disposition: Skilled nursing facility Diet recommendation:  Carb modified diet DISCHARGE MEDICATION: Allergies as of 06/09/2022       Reactions   Coffee Flavor Nausea And Vomiting   Per pt she is "allergic" to the smell of coffee   Prednisone Other (See Comments)   Tightness in chest   Other Other (See Comments)   Plain nystatin ointment-makes skin get worse Patient is allergic to any petroleum based ointment         Medication List     STOP taking these medications    acetaminophen 650 MG CR tablet Commonly known as: TYLENOL Replaced by: acetaminophen 325 MG tablet   aspirin EC 81 MG tablet   clorazepate 7.5 MG tablet Commonly known as: TRANXENE       TAKE these medications    acetaminophen 325 MG tablet Commonly known as: TYLENOL Take 2 tablets (650 mg total) by mouth every 4 (four) hours as needed. Replaces: acetaminophen 650 MG CR tablet   bisacodyl 10 MG suppository Commonly known as: DULCOLAX Place 1 suppository (10 mg total) rectally daily as needed for moderate constipation.   CALTRATE 600+D PO Take 2 tablets by mouth every evening.   Centrum Silver Ultra Womens Tabs Take 1 tablet by mouth 2 (two) times daily. What changed: Another medication with the same name was removed. Continue taking this medication, and follow the directions you see here.   cholecalciferol 1000 units tablet Commonly known as: VITAMIN D Take 2,000 Units by mouth daily.  clotrimazole-betamethasone cream Commonly known as: LOTRISONE Apply 1 application topically daily as needed (sores).   Combivent Respimat 20-100 MCG/ACT Aers respimat Generic drug: Ipratropium-Albuterol Inhale 2 puffs into the lungs 2 (two) times daily as needed for wheezing.   DSS 100 MG Caps Take 100 mg by mouth 2 (two) times daily.   enoxaparin 80 MG/0.8ML injection Commonly known  as: LOVENOX Inject 0.7 mLs (70 mg total) into the skin daily for 14 days. Start taking on: June 10, 2022   ferrous sulfate 325 (65 FE) MG tablet Take 650 mg by mouth daily.   gabapentin 600 MG tablet Commonly known as: NEURONTIN Take 600-1,200 mg by mouth 2 (two) times daily. Take 600 mg by mouth in the morning and take 1200 mg by mouth in the evening.   levothyroxine 25 MCG tablet Commonly known as: SYNTHROID Take 25 mcg by mouth daily before breakfast.   lisinopril 20 MG tablet Commonly known as: ZESTRIL Take 20 mg by mouth daily.   metFORMIN 500 MG tablet Commonly known as: GLUCOPHAGE Take 500 mg by mouth 2 (two) times daily.   neomycin-polymyxin-pramoxine 1 % cream Commonly known as: NEOSPORIN PLUS Apply 1 application  topically daily as needed (wound care).   OMEGA 3 500 PO Take 500 mg by mouth 2 (two) times daily.   omeprazole 20 MG capsule Commonly known as: PRILOSEC Take 20 mg by mouth at bedtime.   polyethylene glycol 17 g packet Commonly known as: MIRALAX / GLYCOLAX Take 17 g by mouth daily. Start taking on: June 10, 2022   REFRESH OPTIVE OP Apply 4-5 drops to eye daily as needed (dry eyes).   senna-docusate 8.6-50 MG tablet Commonly known as: Senokot-S Take 2 tablets by mouth 2 (two) times daily.        Contact information for follow-up providers     Altamese [place], MD. Schedule an appointment as soon as possible for a visit in 10 day(s).   Specialty: Orthopedic Surgery Contact information: [place] [address] [phone]              Contact information for after-discharge care     [facility] Preferred [facility]F .   Service: Skilled Nursing Contact information: [place] [phone]                    Discharge Exam: Danley Danker Weights   05/30/22 1924  Weight: (!)  136.1 kg   General exam: Awake, laying in bed, in nad Respiratory system: Normal respiratory effort, no wheezing Cardiovascular system: regular rate, s1, s2 Gastrointestinal system: Soft, nondistended, positive BS Central nervous system: CN2-12 grossly intact, strength intact Extremities: Perfused, no clubbing Skin: Normal skin turgor, no notable skin lesions seen Psychiatry: Mood normal // no visual hallucinations    Condition at discharge: fair  The results of significant diagnostics from this hospitalization (including imaging, microbiology, ancillary and laboratory) are listed below for reference.   Imaging Studies: DG Knee Right Port  Result Date: 06/03/2022 CLINICAL DATA:  Right tibial fracture status post repair EXAM: PORTABLE RIGHT KNEE - 1-2 VIEW COMPARISON:  05/30/2022 FINDINGS: Frontal and lateral views of the right knee are obtained. Interval placement of a lateral plate and screw fixation across the proximal tibial metadiaphyseal fracture, with near anatomic alignment. Proximal fibular fracture unchanged. Stable orthopedic hardware from prior femoral fracture repair. There is diffuse soft tissue swelling.  Subcutaneous gas consistent with recent surgical intervention. IMPRESSION: 1. ORIF of the proximal right tibial fracture seen previously, with near anatomic alignment. 2. Essentially nondisplaced proximal fibular head fracture, stable. Electronically Signed   By: Randa Ngo M.D.   On: 06/03/2022 19:40   DG Tibia/Fibula Right  Result Date: 06/03/2022 CLINICAL DATA:  [id number] Surgery, elective [id number] EXAM: RIGHT TIBIA AND FIBULA - 2 VIEW COMPARISON:  May 30, 2022 FINDINGS: Spot fluoroscopy images were obtained for surgical planning purposes. Patient is status post ORIF of the tibia. Tibial fracture appears similar comparison to prior with a mildly impacted fracture. Nondisplaced fracture of the fibular neck. Time: 38 seconds Dose: 2.75 mGy Please reference procedure report  for further details. IMPRESSION: Spot fluoroscopy images obtained for surgical planning purposes. Electronically Signed   By: Valentino Saxon M.D.   On: 06/03/2022 18:14   DG C-Arm 1-60 Min-No Report  Result Date: 06/03/2022 Fluoroscopy was utilized by the requesting physician.  No radiographic interpretation.   CT KNEE RIGHT WO CONTRAST  Result Date: 05/31/2022 CLINICAL DATA:  [place].  Remote history of trauma. EXAM: CT OF THE LOWER RIGHT FEMUR WITHOUT CONTRAST, CT OF THE RIGHT KNEE WITHOUT CONTRAST TECHNIQUE: Multidetector CT imaging of the right lower extremity was performed according to the standard protocol. RADIATION DOSE REDUCTION: This exam was performed according to the departmental dose-optimization program which includes automated exposure control, adjustment of the mA and/or kV according to patient size and/or use of iterative reconstruction technique. COMPARISON:  Radiographs 05/30/2022 FINDINGS: The right hip is normally located. Moderate hip joint degenerative changes but no acute hip fracture is identified. There are 2 cannulated hip screws. The more superior screw is loose and has pulled back significantly. It is protruding from the femoral cortex approximately 3 cm. The long stem intramedullary gamma nail is loose with significant surrounding lucency. The mid to upper shaft femur fracture is ununited. Marked lucency surrounding the distal intramedullary gamma nail and the interlocking distal screws consistent with significant loosening. Moderate degenerative changes involving the knee joint but no intra-articular fracture is identified. There is a largely transverse fracture through the metadiaphyseal region of the proximal tibia without displacement. There is also a largely transverse fracture involving the fibular neck. IMPRESSION: 1. 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 No acute hip or femur fracture. 2. Significant loosening of the long stem intramedullary gamma nail,  the most superior dynamic hip screw and the distal interlocking screws. 3. Nondisplaced largely transverse fracture through the metadiaphyseal region of the proximal tibia without intra-articular involvement 4. Transverse fracture involving the fibular neck. Electronically Signed   By: Marijo Sanes M.D.   On: 05/31/2022 10:41   CT FEMUR RIGHT WO CONTRAST  Result Date: 05/31/2022 CLINICAL DATA:  [place].  Remote history of trauma. EXAM: CT OF THE LOWER RIGHT FEMUR WITHOUT CONTRAST, CT OF THE RIGHT KNEE WITHOUT CONTRAST TECHNIQUE: Multidetector CT imaging of the right lower extremity was performed according to the standard protocol. RADIATION DOSE REDUCTION: This exam was performed according to the departmental dose-optimization program which includes automated exposure control, adjustment of the mA and/or kV according to patient size and/or use of iterative reconstruction technique. COMPARISON:  Radiographs 05/30/2022 FINDINGS: The right hip is normally located. Moderate hip joint degenerative changes but no acute hip fracture is identified. There are 2 cannulated hip screws. The more superior screw is loose and has pulled back significantly. It is protruding from the femoral cortex approximately 3 cm. The long stem intramedullary gamma nail is  loose with significant surrounding lucency. The mid to upper shaft femur fracture is ununited. Marked lucency surrounding the distal intramedullary gamma nail and the interlocking distal screws consistent with significant loosening. Moderate degenerative changes involving the knee joint but no intra-articular fracture is identified. There is a largely transverse fracture through the metadiaphyseal region of the proximal tibia without displacement. There is also a largely transverse fracture involving the fibular neck. IMPRESSION: 1. 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 No acute hip or femur fracture. 2. Significant loosening of the long stem intramedullary  gamma nail, the most superior dynamic hip screw and the distal interlocking screws. 3. Nondisplaced largely transverse fracture through the metadiaphyseal region of the proximal tibia without intra-articular involvement 4. Transverse fracture involving the fibular neck. Electronically Signed   By: Marijo Sanes M.D.   On: 05/31/2022 10:41   DG Chest 1 View  Result Date: 05/30/2022 CLINICAL DATA:  Recent fall with chest pain, initial encounter EXAM: CHEST  1 VIEW COMPARISON:  08/07/2015 FINDINGS: The heart size and mediastinal contours are within normal limits. Both lungs are clear. The visualized skeletal structures are unremarkable. IMPRESSION: No active disease. Electronically Signed   By: Inez Catalina M.D.   On: 05/30/2022 22:52   DG Hip Unilat W or Wo Pelvis 2-3 Views Right  Result Date: 05/30/2022 CLINICAL DATA:  Recent trip and fall with right hip pain, initial encounter EXAM: DG HIP (WITH OR WITHOUT PELVIS) 2-3V RIGHT COMPARISON:  01/08/2014 FINDINGS: Postsurgical changes are noted in the right hip and right femur. Considerable callus formation is noted. There is however a lucency identified within the callus suspicious for undisplaced fracture. Pelvic ring is intact. IMPRESSION: Healed fracture in the right femur with callus formation. Postsurgical changes are seen. There is however a lucency identified within the callus suspicious for recurrent fracture. Correlation to point tenderness is recommended. Electronically Signed   By: Inez Catalina M.D.   On: 05/30/2022 22:52   DG Knee Complete 4 Views Right  Result Date: 05/30/2022 CLINICAL DATA:  Recent trip and fall with right knee pain, initial encounter EXAM: RIGHT KNEE - COMPLETE 4+ VIEW COMPARISON:  01/08/2014 FINDINGS: There is a mildly displaced angulated fracture in the proximal tibial metaphysis. Fibular neck fracture is noted as well. Changes of prior operative fixation in the right femur are seen. No gross soft tissue abnormality is noted.  IMPRESSION: Proximal tibial metaphyseal fracture as described. Associated fibular neck fracture is seen as well. Electronically Signed   By: Inez Catalina M.D.   On: 05/30/2022 22:49    Microbiology: Results for orders placed or performed during the hospital encounter of 05/30/22  Surgical PCR screen     Status: None   Collection Time: 06/02/22  9:00 PM   Specimen: Nasal Mucosa; Nasal Swab  Result Value Ref Range Status   MRSA, PCR NEGATIVE NEGATIVE Final   Staphylococcus aureus NEGATIVE NEGATIVE Final    Comment: (NOTE) The Xpert SA Assay (FDA approved for NASAL specimens in patients 29 years of age and older), is one component of a comprehensive surveillance program. It is not intended to diagnose infection nor to guide or monitor treatment. Performed at [facility], [address], [place], [place] [id number]     Labs: CBC: Recent Labs  Lab 06/03/22 0404 06/04/22 0617 06/05/22 0751 06/06/22 0456 06/07/22 0411  WBC 9.1 10.5 11.5* 9.5 8.6  HGB 10.5* 10.9* 9.8* 9.6* 9.9*  HCT 33.5* 33.6* 31.6* 30.3* 30.9*  MCV 92.5 90.1  94.3 94.1 93.9  PLT 318 361 366 343 449   Basic Metabolic Panel: Recent Labs  Lab 06/03/22 0404 06/04/22 0617 06/05/22 0751 06/06/22 0456 06/07/22 0411  NA 137 136 137 135 135  K 4.0 4.8 4.2 4.4 3.9  CL 102 96* 101 97* 99  CO2 '28 28 28 30 29  '$ GLUCOSE 114* 150* 107* 103* 108*  BUN '15 14 21 23 19  '$ CREATININE 0.94 0.89 1.02* 1.08* 1.04*  CALCIUM 9.2 9.3 9.0 9.2 8.8*  MG 2.0 1.9  --   --   --    Liver Function Tests: No results for input(s): "AST", "ALT", "ALKPHOS", "BILITOT", "PROT", "ALBUMIN" in the last 168 hours. CBG: Recent Labs  Lab 06/08/22 1220 06/08/22 1554 06/08/22 2025 06/09/22 0758 06/09/22 1135  GLUCAP 96 130* 110* 101* 110*    Discharge time spent: less than 30 minutes.  Signed: Marylu Lund, MD [facility] 06/09/2022

## 2022-06-09 NOTE — Plan of Care (Signed)
  Problem: Education: Goal: Knowledge of General Education information will improve Description: Including pain rating scale, medication(s)/side effects and non-pharmacologic comfort measures Outcome: Progressing   Problem: Clinical Measurements: Goal: Will remain free from infection Outcome: Progressing   Problem: Activity: Goal: Risk for activity intolerance will decrease Outcome: Progressing   Problem: Elimination: Goal: Will not experience complications related to bowel motility Outcome: Progressing Goal: Will not experience complications related to urinary retention Outcome: Progressing   Problem: Pain Managment: Goal: General experience of comfort will improve Outcome: Progressing   Problem: Safety: Goal: Ability to remain free from injury will improve Outcome: Progressing   Problem: Skin Integrity: Goal: Risk for impaired skin integrity will decrease Outcome: Progressing   Problem: Education: Goal: Ability to describe self-care measures that may prevent or decrease complications (Diabetes Survival Skills Education) will improve Outcome: Progressing

## 2022-06-09 NOTE — TOC Progression Note (Signed)
Transition of Care [facility]) - Progression Note    Patient Details  Name: [name] [name] MRN: [id number] Date of Birth: 07/22/1950  Transition of Care [facility]) CM/SW [place], RN Phone Number: 06/09/2022, 9:59 AM  Clinical Narrative:    CM called and left a message with admission at [facility] in [place] to follow up on possible bed offer.  Patient has Generic Medicare that only offers availability to 2 SNF facilities in the area.  Hoping to obtain bed offer from [facility] in [place] or [facility] in [place].  I have been in conversation with Rosanna, CM at insurance provider to assist in the admission process with limited availability to SNF providers.  Patient was living in a trailer prior to admission with numerous repair issues in the home including holes in the flooring that patient fell through prior to admission to the hospital.  SNF placement is needed for STR and to provide patient's sister time to assist the patient with reapair.  Resources provided to the family on admission day one to start the process.  CM will continue to follow the patient for discharge planning needs for SNF placement.   Expected Discharge Plan: [place] Barriers to Discharge: Continued Medical Work up, SNF Pending bed offer  Expected Discharge Plan and Services   Discharge Planning Services: CM Consult Post Acute Care Choice: [place] arrangements for the past 2 months: Mobile Home                                       Social Determinants of Health (SDOH) Interventions SDOH Screenings   Food Insecurity: Food Insecurity Present (05/31/2022)  Housing: Low Risk  (05/31/2022)  Transportation Needs: Unmet Transportation Needs (05/31/2022)  Utilities: At Risk (05/31/2022)  Tobacco Use: Medium Risk (06/04/2022)    Readmission Risk Interventions    06/01/2022   12:03 PM  Readmission Risk Prevention Plan  Post  Dischage Appt Complete  Medication Screening Complete  Transportation Screening Complete

## 2022-07-09 ENCOUNTER — Emergency Department (HOSPITAL_COMMUNITY): Payer: Medicare (Managed Care)

## 2022-07-09 ENCOUNTER — Encounter (HOSPITAL_COMMUNITY): Payer: Self-pay

## 2022-07-09 ENCOUNTER — Emergency Department (HOSPITAL_COMMUNITY)
Admission: EM | Admit: 2022-07-09 | Discharge: 2022-07-15 | Disposition: A | Discharge status: Home / Self Care | Payer: Medicare (Managed Care) | Attending: Emergency Medicine | Admitting: Emergency Medicine

## 2022-07-09 ENCOUNTER — Other Ambulatory Visit: Payer: Self-pay

## 2022-07-09 ENCOUNTER — Emergency Department (HOSPITAL_BASED_OUTPATIENT_CLINIC_OR_DEPARTMENT_OTHER): Payer: Medicare (Managed Care)

## 2022-07-09 DIAGNOSIS — S82191D Other fracture of upper end of right tibia, subsequent encounter for closed fracture with routine healing: Secondary | ICD-10-CM | POA: Diagnosis not present

## 2022-07-09 DIAGNOSIS — L89149 Pressure ulcer of left lower back, unspecified stage: Secondary | ICD-10-CM | POA: Diagnosis not present

## 2022-07-09 DIAGNOSIS — M79604 Pain in right leg: Secondary | ICD-10-CM | POA: Diagnosis not present

## 2022-07-09 DIAGNOSIS — W19XXXD Unspecified fall, subsequent encounter: Secondary | ICD-10-CM | POA: Insufficient documentation

## 2022-07-09 DIAGNOSIS — S8991XD Unspecified injury of right lower leg, subsequent encounter: Secondary | ICD-10-CM | POA: Diagnosis present

## 2022-07-09 DIAGNOSIS — M7989 Other specified soft tissue disorders: Secondary | ICD-10-CM | POA: Diagnosis not present

## 2022-07-09 DIAGNOSIS — R5381 Other malaise: Secondary | ICD-10-CM

## 2022-07-09 DIAGNOSIS — M8440XA Pathological fracture, unspecified site, initial encounter for fracture: Secondary | ICD-10-CM

## 2022-07-09 DIAGNOSIS — B369 Superficial mycosis, unspecified: Secondary | ICD-10-CM

## 2022-07-09 DIAGNOSIS — B359 Dermatophytosis, unspecified: Secondary | ICD-10-CM | POA: Diagnosis not present

## 2022-07-09 DIAGNOSIS — R0602 Shortness of breath: Secondary | ICD-10-CM | POA: Diagnosis not present

## 2022-07-09 LAB — CBC WITH DIFFERENTIAL/PLATELET
Abs Immature Granulocytes: 0.06 10*3/uL (ref 0.00–0.07)
Basophils Absolute: 0.1 10*3/uL (ref 0.0–0.1)
Basophils Relative: 1 %
Eosinophils Absolute: 0.4 10*3/uL (ref 0.0–0.5)
Eosinophils Relative: 5 %
HCT: 36.1 % (ref 36.0–46.0)
Hemoglobin: 11.1 g/dL — ABNORMAL LOW (ref 12.0–15.0)
Immature Granulocytes: 1 %
Lymphocytes Relative: 20 %
Lymphs Abs: 1.7 10*3/uL (ref 0.7–4.0)
MCH: 28.1 pg (ref 26.0–34.0)
MCHC: 30.7 g/dL (ref 30.0–36.0)
MCV: 91.4 fL (ref 80.0–100.0)
Monocytes Absolute: 0.8 10*3/uL (ref 0.1–1.0)
Monocytes Relative: 9 %
Neutro Abs: 5.5 10*3/uL (ref 1.7–7.7)
Neutrophils Relative %: 64 %
Platelets: 344 10*3/uL (ref 150–400)
RBC: 3.95 MIL/uL (ref 3.87–5.11)
RDW: 14 % (ref 11.5–15.5)
WBC: 8.5 10*3/uL (ref 4.0–10.5)
nRBC: 0 % (ref 0.0–0.2)

## 2022-07-09 LAB — COMPREHENSIVE METABOLIC PANEL
ALT: 20 U/L (ref 0–44)
AST: 22 U/L (ref 15–41)
Albumin: 3.4 g/dL — ABNORMAL LOW (ref 3.5–5.0)
Alkaline Phosphatase: 71 U/L (ref 38–126)
Anion gap: 8 (ref 5–15)
BUN: 19 mg/dL (ref 8–23)
CO2: 25 mmol/L (ref 22–32)
Calcium: 10.2 mg/dL (ref 8.9–10.3)
Chloride: 98 mmol/L (ref 98–111)
Creatinine, Ser: 1.13 mg/dL — ABNORMAL HIGH (ref 0.44–1.00)
GFR, Estimated: 51 mL/min — ABNORMAL LOW (ref >60)
Glucose, Bld: 98 mg/dL (ref 70–99)
Potassium: 4.9 mmol/L (ref 3.5–5.1)
Sodium: 131 mmol/L — ABNORMAL LOW (ref 135–145)
Total Bilirubin: 0.6 mg/dL (ref 0.3–1.2)
Total Protein: 6.9 g/dL (ref 6.5–8.1)

## 2022-07-09 MED ORDER — LEVOTHYROXINE SODIUM 25 MCG PO TABS: 25.0000 ug | ORAL_TABLET | Freq: Every day | ORAL | Status: DC

## 2022-07-09 MED ORDER — TRAMADOL HCL 50 MG PO TABS
50.0000 mg | ORAL_TABLET | Freq: Once | ORAL | Status: AC
  Administered 2022-07-09: 50 mg via ORAL
  Filled 2022-07-09: qty 1

## 2022-07-09 MED ORDER — LEVOTHYROXINE SODIUM 25 MCG PO TABS
25.0000 ug | ORAL_TABLET | Freq: Every day | ORAL | Status: DC
  Administered 2022-07-10 – 2022-07-14 (×5): 25 ug via ORAL
  Filled 2022-07-09 (×5): qty 1

## 2022-07-09 MED ORDER — DOCUSATE SODIUM 100 MG PO CAPS
100.0000 mg | ORAL_CAPSULE | Freq: Two times a day (BID) | ORAL | Status: DC
  Administered 2022-07-09 – 2022-07-14 (×8): 100 mg via ORAL
  Filled 2022-07-09 (×9): qty 1

## 2022-07-09 MED ORDER — IPRATROPIUM-ALBUTEROL 0.5-2.5 (3) MG/3ML IN SOLN
3.0000 mL | Freq: Two times a day (BID) | RESPIRATORY_TRACT | Status: DC | PRN
  Administered 2022-07-12: 3 mL via RESPIRATORY_TRACT
  Filled 2022-07-09: qty 3

## 2022-07-09 MED ORDER — CLOTRIMAZOLE 1 % EX CREA
TOPICAL_CREAM | Freq: Two times a day (BID) | CUTANEOUS | Status: DC
  Filled 2022-07-09 (×2): qty 15

## 2022-07-09 MED ORDER — METFORMIN HCL 500 MG PO TABS
500.0000 mg | ORAL_TABLET | Freq: Two times a day (BID) | ORAL | Status: DC
  Administered 2022-07-09 – 2022-07-14 (×10): 500 mg via ORAL
  Filled 2022-07-09 (×10): qty 1

## 2022-07-09 MED ORDER — ACETAMINOPHEN 325 MG PO TABS
650.0000 mg | ORAL_TABLET | ORAL | Status: DC | PRN
  Administered 2022-07-10 – 2022-07-11 (×3): 650 mg via ORAL
  Filled 2022-07-09 (×3): qty 2

## 2022-07-09 MED ORDER — TRAMADOL HCL 50 MG PO TABS
50.0000 mg | ORAL_TABLET | Freq: Four times a day (QID) | ORAL | Status: DC | PRN
  Administered 2022-07-11 – 2022-07-13 (×4): 50 mg via ORAL
  Filled 2022-07-09 (×4): qty 1

## 2022-07-09 MED ORDER — ACETAMINOPHEN 500 MG PO TABS
1000.0000 mg | ORAL_TABLET | Freq: Once | ORAL | Status: AC
  Administered 2022-07-09: 1000 mg via ORAL
  Filled 2022-07-09: qty 2

## 2022-07-09 MED ORDER — LISINOPRIL 20 MG PO TABS
20.0000 mg | ORAL_TABLET | Freq: Every day | ORAL | Status: DC
  Administered 2022-07-10 – 2022-07-12 (×3): 20 mg via ORAL
  Filled 2022-07-09 (×3): qty 1

## 2022-07-09 MED ORDER — CLOTRIMAZOLE 1 % EX CREA: TOPICAL_CREAM | CUTANEOUS | 0 refills | Status: DC

## 2022-07-09 NOTE — ED Provider Notes (Addendum)
[facility] Provider Note   CSN: [id number] Arrival date & time: 07/09/22  1403     History  Chief Complaint  Patient presents with   Leg Pain    Right leg    Mckenzi Zieser Shawn is a 72 y.o. female.  Patient s/p recent admission for ORIF right proximal tibia fracture and subsequent brief rehab stay, was sent home from rehab stay when insurance benefit was reached, and indicates is having trouble caring for self at home.  Pts HH/OT sent to ED today.  Pt indicates has home health w [facility] but they only come 3x/week for 30-60 minutes. Indicates gets around in broken wheelchair, and is current mobile home is not in good condition. Pt denies new fall. 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 No fever or chills. No dysuria or gu c/o. No abd pain or nvd. No chest pain or sob. No cough or uri symptoms.   The history is provided by the patient, medical records and the EMS personnel.  Leg Pain Associated symptoms: no back pain, no fever and no neck pain        Home Medications Prior to Admission medications   Medication Sig Start Date End Date Taking? Authorizing Provider  acetaminophen (TYLENOL) 325 MG tablet Take 2 tablets (650 mg total) by mouth every 4 (four) hours as needed. 06/09/22   Ainsley Spinner, PA-C  bisacodyl (DULCOLAX) 10 MG suppository Place 1 suppository (10 mg total) rectally daily as needed for moderate constipation. 06/09/22   Donne Hazel, MD  Calcium Carbonate-Vitamin D (CALTRATE 600+D PO) Take 2 tablets by mouth every evening.  Patient not taking: Reported on 06/01/2022    [provider]  Carboxymethylcellul-Glycerin (REFRESH OPTIVE OP) Apply 4-5 drops to eye daily as needed (dry eyes).     [provider]  cholecalciferol (VITAMIN D) 1000 UNITS tablet Take 2,000 Units by mouth daily.     [provider]   clotrimazole-betamethasone (LOTRISONE) cream Apply 1 application topically daily as needed (sores).    [provider]  docusate sodium 100 MG CAPS Take 100 mg by mouth 2 (two) times daily. 03/30/12   Danae Orleans, PA-C  enoxaparin (LOVENOX) 80 MG/0.8ML injection Inject 0.7 mLs (70 mg total) into the skin daily for 14 days. 06/10/22 06/24/22  Ainsley Spinner, PA-C  ferrous sulfate 325 (65 FE) MG tablet Take 650 mg by mouth daily.     [provider]  gabapentin (NEURONTIN) 600 MG tablet Take 600-1,200 mg by mouth 2 (two) times daily. Take 600 mg by mouth in the morning and take 1200 mg by mouth in the evening.    [provider]  Ipratropium-Albuterol (COMBIVENT RESPIMAT) 20-100 MCG/ACT AERS respimat Inhale 2 puffs into the lungs 2 (two) times daily as needed for wheezing.     [provider]  levothyroxine (SYNTHROID) 25 MCG tablet Take 25 mcg by mouth daily before breakfast.    [provider]  lisinopril (PRINIVIL,ZESTRIL) 20 MG tablet Take 20 mg by mouth daily.     [provider]  metFORMIN (GLUCOPHAGE) 500 MG tablet Take 500 mg by mouth 2 (two) times daily.     [provider]  Multiple Vitamins-Minerals (CENTRUM SILVER ULTRA WOMENS) TABS Take 1 tablet by mouth 2 (two) times daily.     [provider]  neomycin-polymyxin-pramoxine (NEOSPORIN PLUS) 1 % cream Apply 1 application  topically daily as needed (wound care).    [provider]  Omega-3 Fatty Acids (OMEGA 3 500 PO) Take 500 mg by mouth 2 (two) times daily.    [provider]  omeprazole (PRILOSEC) 20 MG capsule Take 20 mg by mouth at bedtime.     [provider]  polyethylene glycol (MIRALAX / GLYCOLAX) 17 g packet Take 17 g by mouth daily. 06/10/22   Donne Hazel, MD  senna-docusate (SENOKOT-S) 8.6-50 MG tablet Take 2 tablets by mouth 2 (two) times daily. 06/09/22   Donne Hazel, MD      Allergies    Coffee flavor, Prednisone, and  Other    Review of Systems   Review of Systems  Constitutional:  Negative for chills and fever.  HENT:  Negative for sore throat.   Eyes:  Negative for redness.  Respiratory:  Negative for cough and shortness of breath.   Cardiovascular:  Negative for chest pain.  Gastrointestinal:  Negative for abdominal pain, diarrhea and vomiting.  Genitourinary:  Negative for dysuria and flank pain.  Musculoskeletal:  Negative for back pain and neck pain.  Skin:  Negative for rash.  Neurological:  Negative for headaches.  Hematological:  Does not bruise/bleed easily.  Psychiatric/Behavioral:  Negative for confusion.     Physical Exam Updated Vital Signs BP 108/63 (BP Location: Left Wrist)   Pulse 80   Temp (!) 97.5 F (36.4 C) (Oral)   Resp 18   Ht 1.549 m ('5\' 1"'$ )   Wt 121.6 kg   SpO2 98%   BMI 50.64 kg/m  Physical Exam Vitals and nursing note reviewed.  Constitutional:      Appearance: Normal appearance. She is well-developed.  HENT:     Head: Atraumatic.     Nose: Nose normal.     Mouth/Throat:     Mouth: Mucous membranes are moist.  Eyes:     General: No scleral icterus.    Conjunctiva/sclera: Conjunctivae normal.     Pupils: Pupils are equal, round, and reactive to light.  Neck:     Trachea: No tracheal deviation.  Cardiovascular:     Rate and Rhythm: Normal rate and regular rhythm.     Pulses: Normal pulses.     Heart sounds: Normal heart sounds. No murmur heard.    No friction rub. No gallop.  Pulmonary:     Effort: Pulmonary effort is normal. No respiratory distress.     Breath sounds: Normal breath sounds.  Abdominal:     General: Bowel sounds are normal. There is no distension.     Palpations: Abdomen is soft.     Tenderness: There is no abdominal tenderness. There is no guarding.  Genitourinary:    Comments: No cva tenderness.  Musculoskeletal:     Cervical back: Normal range of motion and neck supple. No rigidity. No muscular tenderness.     Comments: Mild  swelling and tenderness to right knee. No cellulitis, no crepitus, no acute infection noted. Compartments of RLE are soft, not tense. Distal pulses palp.   Small, stage II pressure sore to lumbar region, ~ 2 cm diameter, no crepitus, no cellulitis, no malodor or purulent drainage to area.  Mild fungal dermatitis to moist areas of skin creases in perineal and inguinal areas.   Skin:    General: Skin is warm and dry.     Findings: No rash.  Neurological:     Mental Status: She is alert.     Comments: Alert, speech normal.  Motor/sens grossly intact bil.   Psychiatric:        Mood and Affect: Mood normal.     ED Results / Procedures / Treatments   Labs (all labs ordered are listed, but only abnormal results are displayed) Results for orders placed or performed during the hospital encounter of 07/09/22  Comprehensive metabolic panel  Result Value Ref Range   Sodium 131 (L) 135 - 145 mmol/L   Potassium 4.9 3.5 - 5.1 mmol/L   Chloride 98 98 - 111 mmol/L   CO2 25 22 - 32 mmol/L   Glucose, Bld 98 70 - 99 mg/dL   BUN 19 8 - 23 mg/dL   Creatinine, Ser 1.13 (H) 0.44 - 1.00 mg/dL   Calcium 10.2 8.9 - 10.3 mg/dL   Total Protein 6.9 6.5 - 8.1 g/dL   Albumin 3.4 (L) 3.5 - 5.0 g/dL   AST 22 15 - 41 U/L   ALT 20 0 - 44 U/L   Alkaline Phosphatase 71 38 - 126 U/L   Total Bilirubin 0.6 0.3 - 1.2 mg/dL   GFR, Estimated 51 (L) >60 mL/min   Anion gap 8 5 - 15  CBC with Differential  Result Value Ref Range   WBC 8.5 4.0 - 10.5 K/uL   RBC 3.95 3.87 - 5.11 MIL/uL   Hemoglobin 11.1 (L) 12.0 - 15.0 g/dL   HCT 36.1 36.0 - 46.0 %   MCV 91.4 80.0 - 100.0 fL   MCH 28.1 26.0 - 34.0 pg   MCHC 30.7 30.0 - 36.0 g/dL   RDW 14.0 11.5 - 15.5 %   Platelets 344 150 - 400 K/uL   nRBC 0.0 0.0 - 0.2 %   Neutrophils Relative % 64 %   Neutro Abs 5.5 1.7 - 7.7 K/uL   Lymphocytes Relative 20 %   Lymphs Abs 1.7 0.7 - 4.0 K/uL   Monocytes Relative 9 %   Monocytes Absolute 0.8 0.1 - 1.0 K/uL   Eosinophils Relative 5  %   Eosinophils Absolute 0.4 0.0 - 0.5 K/uL   Basophils Relative 1 %   Basophils Absolute 0.1 0.0 - 0.1 K/uL   Immature Granulocytes 1 %   Abs Immature Granulocytes 0.06 0.00 - 0.07 K/uL      EKG None  Radiology DG Knee Complete 4 Views Right  Result Date: 07/09/2022 CLINICAL DATA:  Right leg pain after a fall weeks ago. EXAM: RIGHT KNEE - COMPLETE 4+ VIEW COMPARISON:  06/03/2022 FINDINGS: Postoperative changes including intramedullary rod in the femur with 2 distal locking screws. The proximal extent of the rod is not included. There is bone lucency around the fixation screws and rod which may indicate loosening. Similar appearance to previous study. There is a surgical anchor screw in the anterior distal femur, likely representing ligament repair. There is a lateral plate and screw across the healing fracture of the proximal tibial metaphysis. Fracture lines remain visible with sclerosis consistent with healing. Alignment is similar to the prior study. Healing transverse fracture of the proximal fibula with alignment unchanged. Degenerative changes in the knee. Small effusion. IMPRESSION: 1. Postoperative changes as discussed. Lucency around the femoral intramedullary rod and locking screws may indicate loosening. Appearances are similar to prior study. 2. Healing fractures of the proximal tibia and fibula. No new acute fractures are identified. Electronically Signed   By: Lucienne Capers M.D.   On: 07/09/2022 17:39   VAS [place] LOWER EXTREMITY VENOUS (DVT) (7a-7p)  Result Date: 07/09/2022  Lower Venous DVT Study  Patient Name:  CHEETARA JOLLIFFE Yankey  Date of Exam:   07/09/2022 Medical Rec #: [id number]      Accession #:    [id number] Date of Birth: 1951/03/05      Patient Gender: F Patient Age:   28 years Exam Location:  [facility] Procedure:      VAS [place] LOWER EXTREMITY VENOUS (DVT) Referring Phys: Sheppard Coil SCHUTT --------------------------------------------------------------------------------   Indications: Swelling.  Limitations: Body habitus and poor ultrasound/tissue interface. Comparison Study: No pevious exams Performing Technologist: Jody Hill RVT, RDMS  Examination Guidelines: A complete evaluation includes B-mode imaging, spectral Doppler, color Doppler, and power Doppler as needed of all accessible portions of each vessel. Bilateral testing is considered an integral part of a complete examination. Limited examinations for reoccurring indications may be performed as noted. The reflux portion of the exam is performed with the patient in reverse Trendelenburg.  +---------+---------------+---------+-----------+----------+-------------------+ RIGHT    CompressibilityPhasicitySpontaneityPropertiesThrombus Aging      +---------+---------------+---------+-----------+----------+-------------------+ CFV      Full           Yes      Yes                                      +---------+---------------+---------+-----------+----------+-------------------+ SFJ      Full                                                             +---------+---------------+---------+-----------+----------+-------------------+ FV Prox  Full           Yes      Yes                                      +---------+---------------+---------+-----------+----------+-------------------+ FV Mid   Full           Yes      Yes                                      +---------+---------------+---------+-----------+----------+-------------------+ FV DistalFull           Yes      Yes                                      +---------+---------------+---------+-----------+----------+-------------------+ PFV      Full                                                             +---------+---------------+---------+-----------+----------+-------------------+ POP      Full           Yes      Yes                                       +---------+---------------+---------+-----------+----------+-------------------+  PTV      Full                                         Not well visualized +---------+---------------+---------+-----------+----------+-------------------+ PERO     Full                                         Not well visualized +---------+---------------+---------+-----------+----------+-------------------+   +----+---------------+---------+-----------+----------+--------------+ LEFTCompressibilityPhasicitySpontaneityPropertiesThrombus Aging +----+---------------+---------+-----------+----------+--------------+ CFV Full           Yes      Yes                                 +----+---------------+---------+-----------+----------+--------------+     *See table(s) above for measurements and observations.    Preliminary     Procedures Procedures    Medications Ordered in ED Medications - No data to display  ED Course/ Medical Decision Making/ A&P                             Medical Decision Making Problems Addressed: Chronic fracture: chronic illness or injury that poses a threat to life or bodily functions Fungal dermatitis: acute illness or injury Other closed fracture of proximal end of right tibia with routine healing, subsequent encounter: chronic illness or injury that poses a threat to life or bodily functions Physical deconditioning: chronic illness or injury with exacerbation, progression, or side effects of treatment that poses a threat to life or bodily functions Pressure injury of skin of left lower back, unspecified injury stage: acute illness or injury  Amount and/or Complexity of Data Reviewed Independent Historian: EMS    Details: hx External Data Reviewed: notes. Labs: ordered. Decision-making details documented in ED Course. Radiology: ordered and independent interpretation performed. Decision-making details documented in ED Course. Discussion of management or test  interpretation with external provider(s): TOC team  Risk OTC drugs. Prescription drug management. Decision regarding hospitalization.   Iv ns. Continuous pulse ox and cardiac monitoring. Labs ordered/sent. Imaging ordered.   Differential diagnosis includes physical deconditioning, failure to thrive, dehydration, aki, new/worsening anemia, etc. Dispo decision including potential need for admission considered - will get labs and imaging and reassess.   Reviewed nursing notes and prior charts for additional history. External reports reviewed. Additional history from: EMS.   Cardiac monitor: sinus rhythm, rate 80.  Labs reviewed/interpreted by me - wbc and hct normal. No aki.   Xrays reviewed/interpreted by me - no new fx. No dvt.   TOC team consulted.  They assessed and spoke w pt - indicates w pts insurance/medicare, not placeable in facility and was recently d/c'd from facility. After speaking w pt, recommending will f/u to maximize home health services.  Pt indicates her preference remains not to be placed into a long term snf as well.   Vitals normal, no distress - pt currently appears stable for d/c.  Rec close pcp f/u.  [facility] was called for transport home.  Pt sister was reached but indicates not able  to be there tonight, and pt not able to care for self, pts wheelchair broken, etc.  PTAR indicated no home health, but pt reports home health is coming 3x/week but only for brief  periods.   Pt/family indicates not able to be at home or care for self even w referral to increase home health services. Discussed that in times like this, with deconditioning, insurance issues, etc, that successfully managing at home is group effort involving pt, friends/neighbors, family, home health, etc.  Sister indicates is willing help pt apply for medicaid Monday, but not able to help out today, and even if there, not strong enough to help care for pt, transfer pt.   TOC working on placement and/or  other solutions. PT consult ordered. Home meds ordered.               Final Clinical Impression(s) / ED Diagnoses Final diagnoses:  None    Rx / DC Orders ED Discharge Orders     None          Lajean Saver, MD 07/09/22 2237

## 2022-07-09 NOTE — Progress Notes (Addendum)
TOC CSW consulted with pt about dc plan.  Pt was recently dc'd from [facility].  Pt was at [facility] for 3 weeks.  Pt previously had HH with [facility] and would like them again for [facility].  CSW passed this information to TOC CM.  Rendon Howell Tarpley-Carter, MSW, LCSW-A Pronouns:  She/Her/Hers [facility] HealthTransitions of Care Clinical Social Worker Direct Number:  [phone] [email or web address]

## 2022-07-09 NOTE — Care Management (Signed)
    Durable Medical Equipment  (From admission, onward)           Start     Ordered   07/09/22 2008  For home use only DME lightweight manual wheelchair with seat cushion  Once       Comments: Patient suffers from generalized which impairs their ability to perform daily activities like feeding and toileting in the home.  A walker will not resolve  issue with performing activities of daily living. A wheelchair will allow patient to safely perform daily activities. Patient is not able to propel themselves in the home using a standard weight wheelchair due to general weakness. Patient can self propel in the lightweight wheelchair. Length of need 6 months . Accessories: elevating leg rests (ELRs), wheel locks, extensions and anti-tippers.   07/09/22 2009   07/09/22 0000  For home use only DME standard manual wheelchair with seat cushion       Comments: Patient suffers from right femur and tibia fracture which impairs their ability to perform daily activities like toileting in the home.  A walker will not resolve issue with performing activities of daily living. A wheelchair will allow patient to safely perform daily activities. Patient can safely propel the wheelchair in the home or has a caregiver who can provide assistance. Length of need Lifetime. Accessories: elevating leg rests (ELRs), wheel locks, extensions and anti-tippers.   07/09/22 1803

## 2022-07-09 NOTE — Progress Notes (Signed)
TOC CSW spoke with pts sister, [name] [name] [phone].  [name] [name] stated that [facility] contacted EMS today to bring pt in.  Pt was dc'd from [facility] because pt could not pay $7,500 for a months stay.  After pts dc pt was found stuck in wheelchair by [facility]. Pt had been stuck in the wheelchair for a week.  [place] [employer] was at pts home because prior to her going to [facility], [place] had deemed her home unlivable.  [place] [employer] knew she had been dc'd from [facility] and came to visit pt to check on her welfare.  [name] [name] does not recall the social workers name that has been working with pt.  This information was provided by [name] [name], pts sister.  [name] [name], MSW, LCSW-A Pronouns:  She/Her/Hers [facility] HealthTransitions of Care Clinical Social Worker Direct Number:  [phone] [email or web address]

## 2022-07-09 NOTE — Progress Notes (Signed)
RLE venous duplex has been completed.  Preliminary results given to Dr. Ashok Cordia.   Results can be found under chart review under CV PROC. 07/09/2022 6:55 PM Trinady Milewski RVT, RDMS

## 2022-07-09 NOTE — ED Notes (Signed)
AVS reviewed with pt prior to discharge. Pt verbalizes understanding. Belongings with pt upon depart. Pt taken home via [facility]. [facility] given report on pt status prior to depart.

## 2022-07-09 NOTE — ED Triage Notes (Signed)
Pt BIB EMS from home. Per EMS, pt was recently discharged home from rehab. Since being home, pt has had several falls and has been unable to get out of her wheelchair in a week. A/Ox4.

## 2022-07-09 NOTE — Care Management (Addendum)
Patient stated she was previously with [facility] spoke with liaison who reported patient was not active with [facility], Patient has a Medicare Advantage health plan. If patient is cleared for safe discharge, I will send her referral to other [facility] agencies, it may be 24-48 hours once a HH is established. Will update RN and EDP. ED CM will follow up with the patient concerning [facility] tomorrow.  Laurena Slimmer RN, BSN  ED Care Manager [phone]

## 2022-07-09 NOTE — Discharge Instructions (Addendum)
It was our pleasure to provide your ER care today - we hope that you feel better.  Our TOC/Social work team will follow up to try to maximize your home health services.  Also discuss with primary care doctor office and your CHESS/ACO staff maximizing home health services and equipment, and seeing if they can help coordinate those efforts.   You may want to also pursue other longer term options - whether feasibility of living with family member/sister, etc.   Make sure to keep pressure off lower back area, turning/change positions frequently, and keep superficial wound to area very clean.   For rash to skin crease areas, keep areas very clean and dry - after bathing, make sure to dry areas completely (can use hair dryer on cool/warm setting to ensure areas get dry), then apply thin coat of clotrimazole cream to area.   Follow up closely with your primary care doctor in the coming week.   Return to ER if worse, new symptoms, fevers, chest pain, trouble breathing, severe abdominal pain, persistent vomiting, weak/fainting, or other emergency concern.

## 2022-07-09 NOTE — ED Provider Triage Note (Signed)
Emergency Medicine Provider Triage Evaluation Note  [name] [name] , a 72 y.o. female  was evaluated in triage.  Pt complains of weakness and multiple falls.  Was discharged from rehab approximately 12 days ago.  Fell onto her buttocks the day after discharge because she felt weak.  Slid to the ground the next day for same reasons.  Has continued to feel weak.  Has not been able to get out of her wheelchair at home.  Complains of right knee pain.  Underwent surgery less than 4 weeks ago for fracture.  Denies fevers or chills  Review of Systems  Positive: As above Negative: As above  Physical Exam  BP 108/63 (BP Location: Left Wrist)   Pulse 80   Temp (!) 97.5 F (36.4 C) (Oral)   Resp 18   Ht '5\' 1"'$  (1.549 m)   Wt 121.6 kg   SpO2 98%   BMI 50.64 kg/m  Gen:   Awake, no distress   Resp:  Normal effort  MSK:   Moves extremities without difficulty  Other:  Swelling and erythema to the right lower extremity compared to left  Medical Decision Making  Medically screening exam initiated at 2:43 PM.  Appropriate orders placed.  [name] [name] was informed that the remainder of the evaluation will be completed by another provider, this initial triage assessment does not replace that evaluation, and the importance of remaining in the ED until their evaluation is complete.     Roylene Reason, [place] 07/09/22 1444

## 2022-07-10 NOTE — ED Notes (Addendum)
Pt moved over to hospital bed at this time

## 2022-07-10 NOTE — ED Notes (Signed)
Pt's sister updated on pt status over phone call

## 2022-07-10 NOTE — Progress Notes (Addendum)
12pm: CSW spoke with Diane, supervisor at [facility] who states the patient's home is completely unlivable in it's current condition. Per Diane, patient's home is a mobile home and has poor flooring that does not the support the weight of the patient and her wheelchair. The home is full of trash and poorly disposed of human waste. Diane requesting an FL2 be completed as patient will likely need to be placed into a facility for LTC. Diane reports a Medicaid application will be completed by APS SW on Monday.  Per DSS - there are barriers to discharge home at this time. Please communicate all discharge planning needs with DSS staff.  [name] - [phone] [place] Ka - [phone]   9:15am: CSW spoke with Thayer Jew of [place] who states he does not see any documentation stating that the patient's home is unlivable but he will contact his supervisor to discuss further and will return call to [place].  Madilyn Fireman, MSW, LCSW Transitions of Care  Clinical Social Worker II [phone]

## 2022-07-10 NOTE — ED Notes (Signed)
Got report from [facility], all questions answered. Assuming care at this time.

## 2022-07-10 NOTE — ED Notes (Signed)
Pt currently sleeping in bed.

## 2022-07-10 NOTE — ED Notes (Signed)
This EMT checked on the patient after physical therapy finished with the patient. This EMT offered to set up her breakfast tray, the patient refused. This EMT brought the patient some warm blankets and assured the patient environment was secured.

## 2022-07-10 NOTE — Evaluation (Signed)
Physical Therapy Evaluation Patient Details Name: [name] [name] MRN: [id number] DOB: [birth year] Today's Date: 07/10/2022  History of Present Illness  72 y.o. female presents to [facility] hospital on 07/09/2022 with difficulty caring for herself after being sent home from rehab for R femur fx. Pt with recent ORIF of R proximal tibia fx. PMH includes  COPD, HTN, HLD, DM, bipolar disorder.  Clinical Impression  Pt presents to PT with deficits in functional mobility, gait, balance, endurance, strength, power, and with significant back and LE pain which limits mobility. Pt requires significant assistance to mobilize in bed at this time, with sheets saturated in urine. PT defers attempts at standing as pt is unable to reach feet to floor in ED stretcher, leading to an increased risk for falls. Pt is at a high risk for falls and reports multiple falls since discharge from SNF. Pt reports little to no reliable caregiver support at this time. Pt does not appear safe to discharge home and is at high risk for fall and re-injury. Pt is unable to afford SNF placement out of pocket. PT will continue to follow in an effort to progress to a return home. Pt will need to be able to transfer independently while maintaining NWB through RLE (or mobilize independently if WB status is able to be upgraded while hospitalized).     Recommendations for follow up therapy are one component of a multi-disciplinary discharge planning process, led by the attending physician.  Recommendations may be updated based on patient status, additional functional criteria and insurance authorization.  Follow Up Recommendations Skilled nursing-short term rehab (<3 hours/day) Can patient physically be transported by private vehicle: No    Assistance Recommended at Discharge Frequent or constant Supervision/Assistance  Patient can return home with the following  Two people to help with walking and/or transfers;Two people to help with  bathing/dressing/bathroom;Assistance with cooking/housework;Assist for transportation;Help with stairs or ramp for entrance    Equipment Recommendations Hospital bed;Wheelchair (measurements PT) (hoyer lift, pt reports he current wheelchair is broken)  Recommendations for Other Services       Functional Status Assessment Patient has had a recent decline in their functional status and demonstrates the ability to make significant improvements in function in a reasonable and predictable amount of time.     Precautions / Restrictions Precautions Precautions: Fall Restrictions Weight Bearing Restrictions: Yes RLE Weight Bearing: Non weight bearing      Mobility  Bed Mobility Overal bed mobility: Needs Assistance Bed Mobility: Supine to Sit, Sit to Supine, Rolling Rolling: Min assist   Supine to sit: Mod assist, HOB elevated Sit to supine: Mod assist, HOB elevated        Transfers Overall transfer level:  (deferred as pt is unable to reach feet to floor on ED stretcher)                      Ambulation/Gait                  Stairs            Wheelchair Mobility    Modified Rankin (Stroke Patients Only)       Balance Overall balance assessment: Needs assistance Sitting-balance support: No upper extremity supported, Feet supported Sitting balance-Leahy Scale: Fair Sitting balance - Comments: great fear of falling  Pertinent Vitals/Pain Pain Assessment Pain Assessment: 0-10 Pain Score: 10-Worst pain ever Pain Location: everywhere Pain Descriptors / Indicators: Aching Pain Intervention(s): Monitored during session    Home Living Family/patient expects to be discharged to:: Private residence Living Arrangements: Alone Available Help at Discharge: Family;Available PRN/intermittently (appears to have limited support) Type of Home: Mobile home Home Access: Ramped entrance       Home Layout:  One level Home Equipment: Conservation officer, nature (2 wheels);Wheelchair - manual;Tub bench;BSC/3in1 Additional Comments: wheelchair is broken    Prior Function Prior Level of Function : Needs assist             Mobility Comments: pt has been transferring from wheelchair to bedside commode, has fallen multiple times since leaving rehab ADLs Comments: has been having difficulty transferring to bedside commode     Hand Dominance        Extremity/Trunk Assessment   Upper Extremity Assessment Upper Extremity Assessment: Generalized weakness    Lower Extremity Assessment Lower Extremity Assessment: Generalized weakness (pain limiting mobility in RLE)    Cervical / Trunk Assessment Cervical / Trunk Assessment: Other exceptions Cervical / Trunk Exceptions: obesity  Communication   Communication: No difficulties  Cognition Arousal/Alertness: Awake/alert Behavior During Therapy: WFL for tasks assessed/performed Overall Cognitive Status: Within Functional Limits for tasks assessed                                          General Comments General comments (skin integrity, edema, etc.): VSS on RA, pt noted to be laying in wet sheets, saturated with urine. Reports she did not request assistance for cleaning from nursing staff overnight    Exercises     Assessment/Plan    PT Assessment Patient needs continued PT services  PT Problem List Decreased strength;Decreased activity tolerance;Decreased balance;Decreased mobility;Decreased knowledge of use of DME;Pain       PT Treatment Interventions DME instruction;Functional mobility training;Therapeutic activities;Therapeutic exercise;Balance training;Neuromuscular re-education;Cognitive remediation;Wheelchair mobility training;Patient/family education    PT Goals (Current goals can be found in the Care Plan section)  Acute Rehab PT Goals Patient Stated Goal: to reduce pain, stop falling PT Goal Formulation: With  patient Time For Goal Achievement: 07/24/22 Potential to Achieve Goals: Fair Additional Goals Additional Goal #1: Pt will mobilize in a manual wheelchair for 50' at a modI level to demonstrate independence in household mobility    Frequency Min 3X/week (SNF likely not an option, will need to improve mobility to return home)     Co-evaluation               AM-PAC PT "6 Clicks" Mobility  Outcome Measure Help needed turning from your back to your side while in a flat bed without using bedrails?: A Little Help needed moving from lying on your back to sitting on the side of a flat bed without using bedrails?: A Lot Help needed moving to and from a bed to a chair (including a wheelchair)?: Total Help needed standing up from a chair using your arms (e.g., wheelchair or bedside chair)?: Total Help needed to walk in hospital room?: Total Help needed climbing 3-5 steps with a railing? : Total 6 Click Score: 9    End of Session   Activity Tolerance: Patient tolerated treatment well Patient left: in bed (in hallway stretcher, no call bell) Nurse Communication: Mobility status;Need for lift equipment PT Visit Diagnosis: Other abnormalities of gait  and mobility (R26.89);Repeated falls (R29.6);Muscle weakness (generalized) (M62.81)    Time: [id number] PT Time Calculation (min) (ACUTE ONLY): 28 min   Charges:   PT Evaluation $PT Eval Low Complexity: 1 Low PT Treatments $Therapeutic Activity: 8-22 mins        Zenaida Niece, PT, DPT Acute Rehabilitation Office [phone]   Zenaida Niece 07/10/2022, 10:12 AM

## 2022-07-10 NOTE — NC FL2 (Signed)
[place] LEVEL OF CARE FORM     IDENTIFICATION  Patient Name: [name] [name] Birthdate: Dec 04, 1950 Sex: female Admission Date (Current Location): 07/09/2022  [facility] and [place] Number:  Herbalist and Address:  [facility]. [place], [place] [address] [place],  [address]      Provider Number: [id number]  Attending Physician Name and Address:  Default, Provider, MD  Relative Name and Phone Number:       Current Level of Care: SNF Recommended Level of Care: [place] Prior Approval Number:    Date Approved/Denied:   PASRR Number: [id number]  Discharge Plan: SNF    Current Diagnoses: Patient Active Problem List   Diagnosis Date Noted   Closed right tibial fracture 05/31/2022   Periprosthetic fracture of shaft of femur 05/30/2022   Type 2 diabetes mellitus without complication (HCC)    Peripheral neuropathy    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 01/08/2014   COPD (chronic obstructive pulmonary disease) ([place])    Hypertension    GERD (gastroesophageal reflux disease)    Depression    Bipolar disorder ([place])    Anxiety    Acute blood loss anemia 03/28/2012   Morbid obesity with BMI of 45.0-49.9, adult ([place]) 03/28/2012   Hyponatremia 03/28/2012   Retained hardware right hip 03/28/2012    Orientation RESPIRATION BLADDER Height & Weight     Self, Time, Situation, Place  Normal Continent, External catheter Weight: 268 lb (121.6 kg) Height:  '5\' 1"'$  (154.9 cm)  BEHAVIORAL SYMPTOMS/MOOD NEUROLOGICAL BOWEL NUTRITION STATUS      Incontinent Diet  AMBULATORY STATUS COMMUNICATION OF NEEDS Skin   Total Care Verbally Normal                       Personal Care Assistance Level of Assistance  Bathing, Feeding, Dressing Bathing Assistance: Maximum assistance Feeding assistance: Independent Dressing Assistance: Maximum assistance     Functional Limitations Info  Sight, Hearing, Speech  Sight Info: Impaired Hearing Info: Adequate Speech Info: Adequate    SPECIAL CARE FACTORS FREQUENCY  PT (By licensed PT), OT (By licensed OT)     PT Frequency: 3x weekly OT Frequency: 3x weekly            Contractures Contractures Info: Not present    Additional Factors Info  Code Status, Allergies, Insulin Sliding Scale Code Status Info: DNR Allergies Info: Coffee Flavor, Prednisone, Plain nystatin ointment-makes skin get worse Patient is allergic to any petroleum based ointment   Insulin Sliding Scale Info: Novolog Insulin Sliding scale - sensitive scale 3 x per day with meals       Current Medications (07/10/2022):  This is the current hospital active medication list Current Facility-Administered Medications  Medication Dose Route Frequency Provider Last Rate Last Admin   acetaminophen (TYLENOL) tablet 650 mg  650 mg Oral Q4H PRN Lajean Saver, MD       clotrimazole (LOTRIMIN) 1 % cream   Topical BID Lajean Saver, MD   Given at 07/09/22 2257   docusate sodium (COLACE) capsule 100 mg  100 mg Oral BID Lajean Saver, MD   100 mg at 07/10/22 [id number]   ipratropium-albuterol (DUONEB) 0.5-2.5 (3) MG/3ML nebulizer solution 3 mL  3 mL Inhalation BID PRN Lajean Saver, MD       levothyroxine (SYNTHROID) tablet 25 mcg  25 mcg Oral Q0600 Lajean Saver, MD   25 mcg at 07/10/22 0604   lisinopril (ZESTRIL)  tablet 20 mg  20 mg Oral Daily Lajean Saver, MD   20 mg at 07/10/22 [id number]   metFORMIN (GLUCOPHAGE) tablet 500 mg  500 mg Oral BID Lajean Saver, MD   500 mg at 07/10/22 [id number]   traMADol (ULTRAM) tablet 50 mg  50 mg Oral Q6H PRN Lajean Saver, MD       Current Outpatient Medications  Medication Sig Dispense Refill   clotrimazole (LOTRIMIN) 1 % cream Apply to affected area 2 times daily 40 g 0   acetaminophen (TYLENOL) 325 MG tablet Take 2 tablets (650 mg total) by mouth every 4 (four) hours as needed. 60 tablet 0   bisacodyl (DULCOLAX) 10 MG suppository Place 1 suppository (10 mg total)  rectally daily as needed for moderate constipation. 20 suppository 0   Calcium Carbonate-Vitamin D (CALTRATE 600+D PO) Take 2 tablets by mouth every evening.  (Patient not taking: Reported on 06/01/2022)     Carboxymethylcellul-Glycerin (REFRESH OPTIVE OP) Apply 4-5 drops to eye daily as needed (dry eyes).      cholecalciferol (VITAMIN D) 1000 UNITS tablet Take 2,000 Units by mouth daily.      clotrimazole-betamethasone (LOTRISONE) cream Apply 1 application topically daily as needed (sores).     docusate sodium 100 MG CAPS Take 100 mg by mouth 2 (two) times daily. 10 capsule    enoxaparin (LOVENOX) 80 MG/0.8ML injection Inject 0.7 mLs (70 mg total) into the skin daily for 14 days. 9.8 mL 0   ferrous sulfate 325 (65 FE) MG tablet Take 650 mg by mouth daily.      gabapentin (NEURONTIN) 600 MG tablet Take 600-1,200 mg by mouth 2 (two) times daily. Take 600 mg by mouth in the morning and take 1200 mg by mouth in the evening.     Ipratropium-Albuterol (COMBIVENT RESPIMAT) 20-100 MCG/ACT AERS respimat Inhale 2 puffs into the lungs 2 (two) times daily as needed for wheezing.      levothyroxine (SYNTHROID) 25 MCG tablet Take 25 mcg by mouth daily before breakfast.     lisinopril (PRINIVIL,ZESTRIL) 20 MG tablet Take 20 mg by mouth daily.      metFORMIN (GLUCOPHAGE) 500 MG tablet Take 500 mg by mouth 2 (two) times daily.      Multiple Vitamins-Minerals (CENTRUM SILVER ULTRA WOMENS) TABS Take 1 tablet by mouth 2 (two) times daily.      neomycin-polymyxin-pramoxine (NEOSPORIN PLUS) 1 % cream Apply 1 application  topically daily as needed (wound care).     Omega-3 Fatty Acids (OMEGA 3 500 PO) Take 500 mg by mouth 2 (two) times daily.     omeprazole (PRILOSEC) 20 MG capsule Take 20 mg by mouth at bedtime.      polyethylene glycol (MIRALAX / GLYCOLAX) 17 g packet Take 17 g by mouth daily. 14 each 0   senna-docusate (SENOKOT-S) 8.6-50 MG tablet Take 2 tablets by mouth 2 (two) times daily. 30 tablet 0      Discharge Medications: Please see discharge summary for a list of discharge medications.  Relevant Imaging Results:  Relevant Lab Results:   Additional Information SSN: [id number]  Archie Endo, LCSW

## 2022-07-11 NOTE — Evaluation (Signed)
Occupational Therapy Evaluation Patient Details Name: [name] [name] MRN: [id number] DOB: [birth year] Today's Date: 07/11/2022   History of Present Illness 72 y.o. female presents to [facility] hospital on 07/09/2022 with difficulty caring for herself after being sent home from rehab for R femur fx. Pt with recent ORIF of R proximal tibia fx. PMH includes  COPD, HTN, HLD, DM, bipolar disorder.   Clinical Impression   [name] [name] was evaluated s/p the above admission list. Since recent d/c home from SNF she reports having several falls with transfer attempts and having a hard time with basic self care tasks. Upon evaluation she was limited by impaired insight, weakness, pain, debility, and sitting tolerance. Overall she requires min A for bed mobility but unable to stand due to weakness and WB status. Due to the deficits listed below, she requires total A for for LB ADLs at bed level and set up for UB ADLs in sitting. Pt reports that she should be WBAT RLE, however her chart states NWB. During LB ADLs, pt noted to have multiple skin integrity issues and wounds around peri-area and in folds, RN notified. Pt will benefit from continued acute OT services. Recommend d/c to SNF.       Recommendations for follow up therapy are one component of a multi-disciplinary discharge planning process, led by the attending physician.  Recommendations may be updated based on patient status, additional functional criteria and insurance authorization.   Follow Up Recommendations  Skilled nursing-short term rehab (<3 hours/day)     Assistance Recommended at Discharge Frequent or constant Supervision/Assistance  Patient can return home with the following A lot of help with walking and/or transfers;Two people to help with walking and/or transfers;A lot of help with bathing/dressing/bathroom;Two people to help with bathing/dressing/bathroom;Assist for transportation;Help with stairs or ramp for entrance    Functional Status  Assessment  Patient has had a recent decline in their functional status and demonstrates the ability to make significant improvements in function in a reasonable and predictable amount of time.  Equipment Recommendations  Other (comment) (defer)       Precautions / Restrictions Precautions Precautions: Fall Restrictions Weight Bearing Restrictions: Yes RLE Weight Bearing: Non weight bearing Other Position/Activity Restrictions: pt reports she is  RLE WBAT - chart says NWB      Mobility Bed Mobility Overal bed mobility: Needs Assistance Bed Mobility: Supine to Sit, Sit to Supine, Rolling Rolling: Min assist   Supine to sit: Min assist Sit to supine: Min assist   General bed mobility comments: increased time and effort needed, HOB elevated    Transfers Overall transfer level: Needs assistance                 General transfer comment: not attempted due to inability to stay NWB and weakness      Balance Overall balance assessment: Needs assistance Sitting-balance support: No upper extremity supported, Feet supported Sitting balance-Leahy Scale: Poor Sitting balance - Comments: multiple losses in balance                                   ADL either performed or assessed with clinical judgement   ADL Overall ADL's : Needs assistance/impaired Eating/Feeding: Independent   Grooming: Set up;Sitting   Upper Body Bathing: Set up;Sitting   Lower Body Bathing: Maximal assistance;Bed level   Upper Body Dressing : Set up;Sitting   Lower Body Dressing: Bed level;Total assistance   Toilet Transfer:  Total assistance   Toileting- Clothing Manipulation and Hygiene: Total assistance       Functional mobility during ADLs: Minimal assistance (bed mobility) General ADL Comments: limited by weakness, pain, obesity     Vision Baseline Vision/History: 1 Wears glasses Vision Assessment?: No apparent visual deficits     Perception Perception Perception  Tested?: No   Praxis Praxis Praxis tested?: Not tested    Pertinent Vitals/Pain Pain Assessment Pain Assessment: Faces Faces Pain Scale: Hurts little more Pain Location: everywhere Pain Descriptors / Indicators: Aching Pain Intervention(s): Limited activity within patient's tolerance, Monitored during session, Patient requesting pain meds-RN notified     Hand Dominance Right   Extremity/Trunk Assessment Upper Extremity Assessment Upper Extremity Assessment: Generalized weakness   Lower Extremity Assessment Lower Extremity Assessment: Generalized weakness   Cervical / Trunk Assessment Cervical / Trunk Assessment: Other exceptions Cervical / Trunk Exceptions: obesity   Communication Communication Communication: No difficulties   Cognition Arousal/Alertness: Awake/alert Behavior During Therapy: WFL for tasks assessed/performed Overall Cognitive Status: No family/caregiver present to determine baseline cognitive functioning                                 General Comments: oriented and following most commands. very limited insight to deficits and safety.     General Comments  VSS on 3L, pt saturated wtih urine upon arrival. did not call out for assist. Multiple skin integrity issues noted around peri area. RN made aware    Exercises     Shoulder Instructions      Home Living Family/patient expects to be discharged to:: Private residence Living Arrangements: Alone Available Help at Discharge: Family;Available PRN/intermittently Type of Home: Mobile home Home Access: Ramped entrance     Home Layout: One level     Bathroom Shower/Tub: Teacher, early years/pre: Standard Bathroom Accessibility: No   Home Equipment: Conservation officer, nature (2 wheels);Wheelchair - manual;Tub bench;BSC/3in1   Additional Comments: wheelchair is broken      Prior Functioning/Environment Prior Level of Function : Needs assist             Mobility Comments: pt  has been transferring from wheelchair to bedside commode, has fallen multiple times since leaving rehab ADLs Comments: using brief for toileting, multiple falls trying to get to [facility] and WC        OT Problem List: Decreased strength;Decreased range of motion;Impaired balance (sitting and/or standing);Decreased activity tolerance;Decreased cognition;Decreased safety awareness;Decreased knowledge of use of DME or AE;Decreased knowledge of precautions;Pain      OT Treatment/Interventions: Self-care/ADL training;Therapeutic exercise;DME and/or AE instruction;Therapeutic activities;Patient/family education;Balance training    OT Goals(Current goals can be found in the care plan section) Acute Rehab OT Goals Patient Stated Goal: to go home OT Goal Formulation: With patient Time For Goal Achievement: 07/25/22 Potential to Achieve Goals: Good ADL Goals Pt Will Perform Lower Body Dressing: with mod assist;sit to/from stand Pt Will Transfer to Toilet: with mod assist;stand pivot transfer;bedside commode Additional ADL Goal #1: Pt will complete bed mobility with supervision A as a precursro to ADLs Additional ADL Goal #2: Pt will maintain RLE NWB with minimal cues 100% of session  OT Frequency: Min 2X/week    Co-evaluation              AM-PAC OT "6 Clicks" Daily Activity     Outcome Measure Help from another person eating meals?: None Help from another person taking care of personal  grooming?: A Little Help from another person toileting, which includes using toliet, bedpan, or urinal?: Total Help from another person bathing (including washing, rinsing, drying)?: A Lot Help from another person to put on and taking off regular upper body clothing?: A Little Help from another person to put on and taking off regular lower body clothing?: Total 6 Click Score: 14   End of Session Equipment Utilized During Treatment: Oxygen Nurse Communication: Mobility status  Activity Tolerance: Patient  tolerated treatment well Patient left: in bed;with call bell/phone within reach;with bed alarm set  OT Visit Diagnosis: Unsteadiness on feet (R26.81);Other abnormalities of gait and mobility (R26.89);Muscle weakness (generalized) (M62.81);History of falling (Z91.81);Repeated falls (R29.6);Pain                Time: 1030-1057 OT Time Calculation (min): 27 min Charges:  OT General Charges $OT Visit: 1 Visit OT Evaluation $OT Eval Moderate Complexity: 1 Mod OT Treatments $Self Care/Home Management : 8-22 mins  Shade Flood, OTR/L Acute Rehabilitation Services Office [phone] Secure Chat Communication Preferred   Elliot Cousin 07/11/2022, 11:16 AM

## 2022-07-11 NOTE — ED Notes (Signed)
This RN assumed care of patient and received off going transfer of care report from off going RN. Pt presented to the ED with leg pain from home. Pt is sleeping on bed at this time, respirations are spontaneous, even, unlabored and symmetrical bilaterally. Pt skin tone is appropriate for ethnicity, dry and warm. A&Ox4 and NAD noted at this time. Pt connected to pulse ox and BP.

## 2022-07-11 NOTE — ED Provider Notes (Signed)
Emergency Medicine Observation Re-evaluation Note  [name] [name] is a 72 y.o. female, seen on rounds today.  Pt initially presented to the ED for complaints of Leg Pain (Right leg) Currently, the patient is asleep.  She is awaiting SNF placement as she is unable to care for herself at home.    Physical Exam  BP (!) 107/49 (BP Location: Right Arm) Comment: pt resting  Pulse 87   Temp 97.7 F (36.5 C) (Oral)   Resp 16   Ht '5\' 1"'$  (1.549 m)   Wt 121.6 kg   SpO2 96%   BMI 50.64 kg/m  Physical Exam General: asleep Cardiac: rr Lungs: clear Psych: asleep  ED Course / MDM  EKG:   I have reviewed the labs performed to date as well as medications administered while in observation.  Recent changes in the last 24 hours include none.  Plan  Current plan is for awaiting placement.    Isla Pence, MD 07/11/22 1012

## 2022-07-11 NOTE — Progress Notes (Signed)
Diane Meredith Mody, [place] DSS requested an [facility] which CSW has provided her with to assist with LTC placement.  Diane will also complete application for Medicaid on Monday (07/12/2022).  TOC will follow up with DSS on Monday.  Amariyah Bazar Tarpley-Carter, MSW, LCSW-A Pronouns:  She/Her/Hers [facility] HealthTransitions of Care Clinical Social Worker Direct Number:  [phone] [email or web address]

## 2022-07-11 NOTE — ED Notes (Signed)
NAD noted, respirations are equal bilaterally and unlabored at this time. Pt resting in gurney and denies any unmet needs. Pt connected to pulseox & BP. Call light within reach.

## 2022-07-12 MED ORDER — PANTOPRAZOLE SODIUM 40 MG PO TBEC
40.0000 mg | DELAYED_RELEASE_TABLET | Freq: Every day | ORAL | Status: DC
  Administered 2022-07-12 – 2022-07-14 (×3): 40 mg via ORAL
  Filled 2022-07-12 (×3): qty 1

## 2022-07-12 NOTE — ED Provider Notes (Signed)
Emergency Medicine Observation Re-evaluation Note  [name] [name] is a 72 y.o. female, seen on rounds today.  Pt initially presented to the ED for complaints of Leg Pain (Right leg) Currently, the patient is awaiting placement as she cannot care for herself at home.  Complains of feeling short of breath this morning.  Does not appear she has gotten her nebulizers..  Physical Exam  BP 123/68   Pulse 81   Temp 98.3 F (36.8 C)   Resp 18   Ht '5\' 1"'$  (1.549 m)   Wt 121.6 kg   SpO2 97%   BMI 50.64 kg/m  Physical Exam General: Mildly increased work of breathing Cardiac: Well-perfused Lungs: Expiratory wheezing on the right Psych: Calm  ED Course / MDM  EKG:   I have reviewed the labs performed to date as well as medications administered while in observation.  Recent changes in the last 24 hours include short of breath this morning needing nebulizers..  Plan  Current plan is for placement.    Ezequiel Essex, MD 07/12/22 [phone]

## 2022-07-12 NOTE — ED Notes (Signed)
Patient sleeping rise and fall of chest noted

## 2022-07-12 NOTE — Progress Notes (Signed)
Physical Therapy Treatment Patient Details Name: [name] [name] MRN: [id number] DOB: 20-Sep-1950 Today's Date: 07/12/2022   History of Present Illness 72 y.o. female presents to [facility] hospital on 07/09/2022 with difficulty caring for herself after being sent home from rehab for R femur fx. Pt with recent ORIF of R proximal tibia fx. PMH includes  COPD, HTN, HLD, DM, bipolar disorder.    PT Comments    Pt remains very limited in her ability to transfer while maintaining NWB through RLE. Pt reports she believed herself to be WBAT through RLE despite practicing transfers on left leg only at SNF. PT stats that she ambulated on her RLE at home and would stop when it began to hurt. Pt remains at a high risk for falls and is unable to successfully stand despite significant assistance from PT. PT provides LE exercise in an effort to strengthen LLE. PT will continue to follow in an effort to improve mobility quality and reduce falls risk.   Recommendations for follow up therapy are one component of a multi-disciplinary discharge planning process, led by the attending physician.  Recommendations may be updated based on patient status, additional functional criteria and insurance authorization.  Follow Up Recommendations  Skilled nursing-short term rehab (<3 hours/day) Can patient physically be transported by private vehicle: No   Assistance Recommended at Discharge Frequent or constant Supervision/Assistance  Patient can return home with the following Two people to help with walking and/or transfers;Two people to help with bathing/dressing/bathroom;Assistance with cooking/housework;Assist for transportation;Help with stairs or ramp for entrance   Equipment Recommendations  Hospital bed;Wheelchair (measurements PT) (pt reports her current wheelchair is broken)    Recommendations for Other Services       Precautions / Restrictions Precautions Precautions: Fall Restrictions Weight Bearing Restrictions:  Yes RLE Weight Bearing: Non weight bearing Other Position/Activity Restrictions: note from Verline Lema on 06/04/2022 states NWB 6 weeks. 6 weeks would expire 07/16/2022.     Mobility  Bed Mobility Overal bed mobility: Needs Assistance Bed Mobility: Supine to Sit, Sit to Supine, Rolling Rolling: Min guard   Supine to sit: Min guard Sit to supine: Min assist   General bed mobility comments: assist for LE management when returning to bed    Transfers Overall transfer level: Needs assistance Equipment used: Rolling walker (2 wheels) Transfers: Sit to/from Stand Sit to Stand:  (attempted x4, twice with walker and twice with face to face transfer, all unsuccessfully)           General transfer comment: unable to stand with maxA from PT. PT fearful when PT attempts to facilitate weight shift to left side to maintain NWB through RLE, reports PT is "turning her"    Ambulation/Gait                   Stairs             Wheelchair Mobility    Modified Rankin (Stroke Patients Only)       Balance Overall balance assessment: Needs assistance Sitting-balance support: No upper extremity supported, Feet supported Sitting balance-Leahy Scale: Good     Standing balance support:  (unable to stand when attempting to maintain NWB through RLE)                                Cognition Arousal/Alertness: Awake/alert Behavior During Therapy: WFL for tasks assessed/performed Overall Cognitive Status: Impaired/Different from baseline Area of Impairment: Memory  Memory: Decreased recall of precautions         General Comments: pt reports she believed she was WBAT, walking on RLE at home, would stop when it began to hurt        Exercises General Exercises - Lower Extremity Ankle Circles/Pumps: AROM, Both, 5 reps Hip ABduction/ADduction: AROM, Both, 10 reps Straight Leg Raises: AROM, Both, 10 reps Other Exercises Other  Exercises: single leg bridge with LLE x 10 reps    General Comments General comments (skin integrity, edema, etc.): VSS, pt removes nasal canula at start of session, denies SOB during      Pertinent Vitals/Pain Pain Assessment Pain Assessment: Faces Faces Pain Scale: Hurts little more Pain Location: everywhere Pain Descriptors / Indicators: Aching Pain Intervention(s): Monitored during session    Home Living                          Prior Function            PT Goals (current goals can now be found in the care plan section) Acute Rehab PT Goals Patient Stated Goal: to reduce pain, stop falling Progress towards PT goals: Progressing toward goals    Frequency    Min 3X/week (has used all medicare SNF days, will likely need to discharge home)      PT Plan Current plan remains appropriate    Co-evaluation              AM-PAC PT "6 Clicks" Mobility   Outcome Measure  Help needed turning from your back to your side while in a flat bed without using bedrails?: A Little Help needed moving from lying on your back to sitting on the side of a flat bed without using bedrails?: A Little Help needed moving to and from a bed to a chair (including a wheelchair)?: Total Help needed standing up from a chair using your arms (e.g., wheelchair or bedside chair)?: Total Help needed to walk in hospital room?: Total Help needed climbing 3-5 steps with a railing? : Total 6 Click Score: 10    End of Session   Activity Tolerance: Patient tolerated treatment well Patient left: in bed;with call bell/phone within reach;with bed alarm set Nurse Communication: Mobility status;Need for lift equipment PT Visit Diagnosis: Other abnormalities of gait and mobility (R26.89);Repeated falls (R29.6);Muscle weakness (generalized) (M62.81)     Time: [id number] PT Time Calculation (min) (ACUTE ONLY): 24 min  Charges:  $Therapeutic Exercise: 8-22 mins $Therapeutic Activity: 8-22  mins                     Zenaida Niece, PT, DPT Acute Rehabilitation Office [place] Aarav Burgett 07/12/2022, 2:01 PM

## 2022-07-12 NOTE — Consult Note (Signed)
WOC Nurse Consult Note: Reason for Consult: pressure injury Wound type: blanchable redness over the sacrum.  Irritant contact dermatitis bilateral groin/buttocks ICD-10 CM Codes for Irritant Dermatitis L24A2 - Due to fecal, urinary or dual incontinence L30.4  - Erythema intertrigo. Also used for abrasion of the hand, chafing of the skin, dermatitis due to sweating and friction, friction dermatitis, friction eczema, and genital/thigh intertrigo.   Pressure Injury POA: No Measurement:NA Wound bed: intense redness bilateral groin, some satellite lesions noted under pannus on the left side Drainage (amount, consistency, odor) NA Periwound: intact  Dressing procedure/placement/frequency: Interdry AG+ antimicrobial wicking fabric to be sued on affected areas.   Discussed POC with patient and bedside nurse.  Re consult if needed, will not follow at this time. Thanks  Carrel Leather [employer], RN,CWOCN, CNS, [place] [phone])

## 2022-07-12 NOTE — Progress Notes (Addendum)
CSW spoke with Lateefah at [facility] who states she is discussing [place] with her supervisor to determine next steps. CSW informed Lateefah that patient has used all of her Medicare days and will be in copay days if she returns to SNF. CSW provided Lateefah with FL2.  CSW spoke with Tommi Rumps at [facility] who states the agency is unable to provide patient with [facility] services due to the poor living conditions. Tommi Rumps states staff are fearful of themselves or patient being harmed due to the environment.  There are APS barriers in place prohibiting the patient to discharge home at this time.  Madilyn Fireman, MSW, LCSW Transitions of Care  Clinical Social Worker II [phone]

## 2022-07-13 NOTE — Progress Notes (Signed)
CSW contacted [facility] and spoke with [place], [phone]. Clifton James stated he will come down now to complete the medicaid application.

## 2022-07-13 NOTE — Progress Notes (Signed)
TOC CSW spoke with [name], pts sister [phone].  [name] was calling with an update.  [name] stated she has been attempting to find the documents required for the [facility] application, but has had trouble finding the documents in pts home.  She stated will continue to search for the documents and complete the application by tomorrow.  [name], MSW, LCSW-A Pronouns:  She/Her/Hers [facility] HealthTransitions of Care Clinical Social Worker Direct Number:  [phone] [name]

## 2022-07-13 NOTE — Progress Notes (Signed)
CSW spoke with [name] [name] - [phone] from [facility]. Ms. [name] [name] stated they cannot assist patient with long term care and asked what the hospital was doing. CSW explained if patient cannot pay out of pocket or does not qualify for medicaid then she will have to discharge back home with services. Ms. [name] [name] stated patients sister [name] [name] applied for medicaid for patient. CSW explained there would have to be a facility willing to take patient medicaid pending. Patient possibly would have to pay for the first month of SNF due to pending medicaid status.    CSW contacted [facility] who previously had long term care medicaid beds. SNF referrals have been sent to 24 facilities.

## 2022-07-13 NOTE — ED Provider Notes (Signed)
Pt alert, nad, vitals normal.   Pt is awaiting TOC placement.  It appears clear pt not able to return to her former residence (reportedly dilapidated, condemned type of mobile home), and is not ambulatory/unable to perform ADLs due to recent right tibia plateau surgery and non-union of right proximal femur fracture.   TOC placement is pending - need to have a plan of action which at this point is not reflected in notes.      Lajean Saver, MD 07/13/22 [phone]

## 2022-07-13 NOTE — ED Notes (Signed)
SNF confirmed

## 2022-07-14 NOTE — Progress Notes (Signed)
CSW spoke with APS worker [name] [name] [phone] about patient refusing to sign medicaid application. [name] [name] stated that if patient chooses to discharge back home then that's her right. [name] [name] stated she spoke with her supervisor and due to her being competent and oriented that they cannot force her to [name] to a SNF and that she has the right to [name] back to her home. CSW did tell [name] [name] that she will reach out to patients POA [name] [name]), [phone] Patient provided this contact to CSW today to call and talk to her about the [facility] application. Patient told CSW today that she will not sign anything until she speaks with [name] [name].    CSW spoke with [name] [name] who stated she is patient healthcare power of attorney. [name] [name] stated patient is not going to sign anything until she reviews what she is signing. CSW explained its a medicaid application so patient can have long term care at a nursing facility. [name] [name] stated that they do not want long term care and the plan was for her to get short term and not long term. CSW told [name] [name] that she spoke with [name] [name] in admissions at [facility] who told CSW that patient only has two days left of short term rehab and then she will have to pay a $100 copay daily. [name] [name] stated she is aware of that and they are going to work on patient getting better insurance. [name] [name] also told CSW that home health will not [name] in the home currently. [name] [name] told CSW to send patient home because patient is not going to agree to sign the medicaid application.   [name] [name] stated they have a safety plan in place at the home. [name] [name] stated they are trying to clean up the home and plan on getting patient a new trailer. [name] [name] stated that she lives 20 minutes down the [place] and will check in with her on the weekends and during the week when she is not working. [name] [name] also stated her sister [name] [name] will also visit patient. Patients neighbor who lives next to her will be  there for emergencies. [name] [name] stated she gets off work at 8:00 PM and will [name] to patients house after work. [name] [name] stated she is going to work with patients sister and get patient back on her traditional medicare plan. Patient originally had [employer] until she switched to her generic plan. [name] [name] stated once they have that it will be easier to get her SNF rehab.   CSW spoke with patients sister [name] [name], [phone] who stated that patient has a new wheelchair that they picked up from [place] at the house. [name] [name] stated she will be by patients house tomorrow. Ms. [name] [name] stated she agrees with [name] [name] plan.   CSW contacted [name] [name] with APS and left a message making her aware of the plan.

## 2022-07-14 NOTE — Progress Notes (Signed)
Physical Therapy Treatment Patient Details Name: [name] [name] MRN: [id number] DOB: [birth year] Today's Date: 07/14/2022   History of Present Illness 72 y.o. female presents to [facility] hospital on 07/09/2022 with difficulty caring for herself after being sent home from rehab for R femur fx. Pt with recent ORIF of R proximal tibia fx. PMH includes  COPD, HTN, HLD, DM, bipolar disorder.    PT Comments    Pt is up to side of bed and immediately removes O2, disagrees with PT report on WB on RLE.  Pt is generally able to listen to information but has been firm with her interpretation of her care needs.  Will continue to recommend her to go to SNF due to need to supervise her choices of WB on RLE, to dismiss need for O2 and to generally not follow guidelines for her LE care.  Pt is apparently asking to go home per chart, and PT will again emphasize that pt has made choices that are unsafe within this therapy session, requiring intervention to avoid injury.  Follow acutely as her stay permits, and reiterate her care needs as are clearly stated on the chart.   Recommendations for follow up therapy are one component of a multi-disciplinary discharge planning process, led by the attending physician.  Recommendations may be updated based on patient status, additional functional criteria and insurance authorization.  Follow Up Recommendations  Skilled nursing-short term rehab (<3 hours/day) Can patient physically be transported by private vehicle: No   Assistance Recommended at Discharge Frequent or constant Supervision/Assistance  Patient can return home with the following Two people to help with walking and/or transfers;Two people to help with bathing/dressing/bathroom;Assistance with cooking/housework;Assist for transportation;Help with stairs or ramp for entrance   Equipment Recommendations  Hospital bed;Wheelchair (measurements PT)    Recommendations for Other Services       Precautions / Restrictions  Precautions Precautions: Fall Restrictions Weight Bearing Restrictions: Yes RLE Weight Bearing: Non weight bearing Other Position/Activity Restrictions: note from Ainsley Spinner on 06/04/2022 states NWB 6 weeks. 6 weeks would expire 07/16/2022.     Mobility  Bed Mobility Overal bed mobility: Needs Assistance Bed Mobility: Supine to Sit, Sit to Supine Rolling: Min guard   Supine to sit: Min guard Sit to supine: Min assist   General bed mobility comments: requires help to avoid WB on RLE    Transfers Overall transfer level: Needs assistance Equipment used: 1 person hand held assist Transfers: Bed to chair/wheelchair/BSC            Lateral/Scoot Transfers: Min assist General transfer comment: Pt is unable to sequence to avoid WB on RLE and stopped progressing with scooting when PT kept RLE from touching the floor to maintain NWB    Ambulation/Gait                   Stairs             Wheelchair Mobility    Modified Rankin (Stroke Patients Only)       Balance Overall balance assessment: Needs assistance Sitting-balance support: Single extremity supported Sitting balance-Leahy Scale: Good Sitting balance - Comments: tends to WB on RLE if it touches the ground Postural control: Posterior lean, Left lateral lean     Standing balance comment: was unable to attempt due to PT not having another person to keep RLE off floor  Cognition Arousal/Alertness: Awake/alert Behavior During Therapy: Impulsive Overall Cognitive Status: Impaired/Different from baseline Area of Impairment: Memory, Safety/judgement, Awareness, Problem solving                     Memory: Decreased short-term memory, Decreased recall of precautions   Safety/Judgement: Decreased awareness of deficits, Decreased awareness of safety Awareness: Intellectual Problem Solving: Slow processing, Requires verbal cues, Requires tactile cues           Exercises General Exercises - Lower Extremity Ankle Circles/Pumps: AROM, 5 reps Quad Sets: AROM, 10 reps Heel Slides: AROM, 10 reps Hip ABduction/ADduction: AROM, 10 reps Straight Leg Raises: AROM, AAROM, 10 reps Hip Flexion/Marching: AROM, 10 reps Other Exercises Other Exercises: single leg bridge with LLE x 10 reps    General Comments General comments (skin integrity, edema, etc.): pt will not leave O2 on, generally argues about WB status and stops trying to scoot on the bed      Pertinent Vitals/Pain Pain Assessment Pain Assessment: Faces Faces Pain Scale: Hurts a little bit Pain Location: R hip Pain Descriptors / Indicators: Guarding    Home Living                          Prior Function            PT Goals (current goals can now be found in the care plan section) Acute Rehab PT Goals Patient Stated Goal: to get home as soon as possible Progress towards PT goals: Not progressing toward goals - comment    Frequency    Min 3X/week      PT Plan Current plan remains appropriate    Co-evaluation              AM-PAC PT "6 Clicks" Mobility   Outcome Measure  Help needed turning from your back to your side while in a flat bed without using bedrails?: A Little Help needed moving from lying on your back to sitting on the side of a flat bed without using bedrails?: A Little Help needed moving to and from a bed to a chair (including a wheelchair)?: Total Help needed standing up from a chair using your arms (e.g., wheelchair or bedside chair)?: Total Help needed to walk in hospital room?: Total Help needed climbing 3-5 steps with a railing? : Total 6 Click Score: 10    End of Session   Activity Tolerance: Patient tolerated treatment well Patient left: in bed;with call bell/phone within reach Nurse Communication: Mobility status PT Visit Diagnosis: Other abnormalities of gait and mobility (R26.89);Repeated falls (R29.6);Muscle weakness  (generalized) (M62.81)     Time: [id number] PT Time Calculation (min) (ACUTE ONLY): 17 min  Charges:  $Therapeutic Exercise: 8-22 mins         Ramond Dial 07/14/2022, 4:47 PM  Mee Hives, PT PhD Acute Rehab Dept. Number: [place] [facility] [place]

## 2022-07-14 NOTE — ED Notes (Signed)
[facility] here to transport patient. Social worker has been contacted.

## 2022-07-14 NOTE — ED Notes (Signed)
[facility] called  

## 2022-07-14 NOTE — Progress Notes (Signed)
CSW spoke with patient who is refusing to the sign the medicaid application. Patient told CSW she can call her POA [name] [name]), [phone]. Patient stated she will not sign anything until [name] [name] can review it. CSW left a message with [name] [name] for a call back.   CSW also spoke with [name] [name] in admissions at [facility] who stated that patient only has two days left of short term rehab and then she will have to pay a $100 copay daily. [name] is not willing to take patient back.

## 2022-07-14 NOTE — Progress Notes (Addendum)
Pt has been evaluated medically and psychiatrically.  Pt has been found to have mental capacity and competent to make her own decisions.  Therefore, pt wants to go back home at this time.  Pts decision was reached because she is refusing to do Medicaid application.  Pts refusal to fill out Medicaid application was a result of pt having to give up SSI check to facility for LTC.  Therefore, pt will return home by [facility] and pts Healthcare POA, [name] has agreed to be there to greet PT[facility] upon there arrival.  Pts wheelchair has been delivered to her home as well.  Pts nurse has been made aware of pts decision and the dc plan.  CSW has also spoke with [name] [phone] at [facility] to update her on the pts dc plan.  [name], MSW, LCSW-A Pronouns:  She/Her/Hers [facility] HealthTransitions of Care Clinical Social Worker Direct Number:  [phone] [name]

## 2022-07-14 NOTE — ED Provider Notes (Signed)
Patient's vital signs unremarkable, no events overnight, she continues to await nursing home placement.   Carmin Muskrat, MD 07/14/22 [phone]

## 2022-07-14 NOTE — Progress Notes (Signed)
TOC CSW contacted [name], Healthcare POA [phone].  CSW notified Magda Paganini that pt will be returning home soon.  Magda Paganini is currently at pts home awaiting her arrival.  Passenger transport manager, MSW, LCSW-A Pronouns:  She/Her/Hers [facility] HealthTransitions of Care Clinical Social Worker Direct Number:  [phone] [email or web address]

## 2022-08-06 DIAGNOSIS — W19XXXA Unspecified fall, initial encounter: Secondary | ICD-10-CM

## 2023-10-23 ENCOUNTER — Other Ambulatory Visit: Payer: Self-pay

## 2023-10-23 ENCOUNTER — Inpatient Hospital Stay (HOSPITAL_COMMUNITY)
Admission: EM | Admit: 2023-10-23 | Discharge: 2023-10-31 | DRG: 871 | Disposition: A | Discharge status: Skilled Nursing Facility | Attending: Internal Medicine | Admitting: Internal Medicine

## 2023-10-23 ENCOUNTER — Encounter (HOSPITAL_COMMUNITY): Payer: Self-pay

## 2023-10-23 ENCOUNTER — Emergency Department (HOSPITAL_COMMUNITY)

## 2023-10-23 DIAGNOSIS — L0390 Cellulitis, unspecified: Secondary | ICD-10-CM | POA: Diagnosis present

## 2023-10-23 DIAGNOSIS — L89816 Pressure-induced deep tissue damage of head: Secondary | ICD-10-CM | POA: Diagnosis present

## 2023-10-23 DIAGNOSIS — Z825 Family history of asthma and other chronic lower respiratory diseases: Secondary | ICD-10-CM

## 2023-10-23 DIAGNOSIS — E860 Dehydration: Secondary | ICD-10-CM | POA: Diagnosis present

## 2023-10-23 DIAGNOSIS — E119 Type 2 diabetes mellitus without complications: Secondary | ICD-10-CM

## 2023-10-23 DIAGNOSIS — Z833 Family history of diabetes mellitus: Secondary | ICD-10-CM

## 2023-10-23 DIAGNOSIS — L309 Dermatitis, unspecified: Secondary | ICD-10-CM | POA: Diagnosis present

## 2023-10-23 DIAGNOSIS — E785 Hyperlipidemia, unspecified: Secondary | ICD-10-CM | POA: Diagnosis present

## 2023-10-23 DIAGNOSIS — G9341 Metabolic encephalopathy: Secondary | ICD-10-CM | POA: Diagnosis present

## 2023-10-23 DIAGNOSIS — G894 Chronic pain syndrome: Secondary | ICD-10-CM | POA: Diagnosis present

## 2023-10-23 DIAGNOSIS — E11649 Type 2 diabetes mellitus with hypoglycemia without coma: Secondary | ICD-10-CM | POA: Diagnosis present

## 2023-10-23 DIAGNOSIS — Z7984 Long term (current) use of oral hypoglycemic drugs: Secondary | ICD-10-CM

## 2023-10-23 DIAGNOSIS — E039 Hypothyroidism, unspecified: Secondary | ICD-10-CM | POA: Diagnosis present

## 2023-10-23 DIAGNOSIS — J392 Other diseases of pharynx: Secondary | ICD-10-CM | POA: Diagnosis not present

## 2023-10-23 DIAGNOSIS — Z5941 Food insecurity: Secondary | ICD-10-CM

## 2023-10-23 DIAGNOSIS — G629 Polyneuropathy, unspecified: Secondary | ICD-10-CM

## 2023-10-23 DIAGNOSIS — J440 Chronic obstructive pulmonary disease with acute lower respiratory infection: Secondary | ICD-10-CM | POA: Diagnosis present

## 2023-10-23 DIAGNOSIS — I10 Essential (primary) hypertension: Secondary | ICD-10-CM | POA: Diagnosis present

## 2023-10-23 DIAGNOSIS — A419 Sepsis, unspecified organism: Principal | ICD-10-CM | POA: Insufficient documentation

## 2023-10-23 DIAGNOSIS — Z5948 Other specified lack of adequate food: Secondary | ICD-10-CM

## 2023-10-23 DIAGNOSIS — F319 Bipolar disorder, unspecified: Secondary | ICD-10-CM | POA: Diagnosis present

## 2023-10-23 DIAGNOSIS — Z79890 Hormone replacement therapy (postmenopausal): Secondary | ICD-10-CM

## 2023-10-23 DIAGNOSIS — H3530 Unspecified macular degeneration: Secondary | ICD-10-CM | POA: Diagnosis present

## 2023-10-23 DIAGNOSIS — R519 Headache, unspecified: Secondary | ICD-10-CM | POA: Diagnosis not present

## 2023-10-23 DIAGNOSIS — B8789 Myiasis of other sites: Secondary | ICD-10-CM | POA: Diagnosis present

## 2023-10-23 DIAGNOSIS — H9190 Unspecified hearing loss, unspecified ear: Secondary | ICD-10-CM | POA: Diagnosis present

## 2023-10-23 DIAGNOSIS — R339 Retention of urine, unspecified: Secondary | ICD-10-CM | POA: Diagnosis present

## 2023-10-23 DIAGNOSIS — F411 Generalized anxiety disorder: Secondary | ICD-10-CM | POA: Diagnosis present

## 2023-10-23 DIAGNOSIS — Z515 Encounter for palliative care: Secondary | ICD-10-CM

## 2023-10-23 DIAGNOSIS — Z806 Family history of leukemia: Secondary | ICD-10-CM

## 2023-10-23 DIAGNOSIS — Z888 Allergy status to other drugs, medicaments and biological substances status: Secondary | ICD-10-CM

## 2023-10-23 DIAGNOSIS — E1122 Type 2 diabetes mellitus with diabetic chronic kidney disease: Secondary | ICD-10-CM | POA: Diagnosis present

## 2023-10-23 DIAGNOSIS — K8020 Calculus of gallbladder without cholecystitis without obstruction: Secondary | ICD-10-CM | POA: Diagnosis present

## 2023-10-23 DIAGNOSIS — J9601 Acute respiratory failure with hypoxia: Secondary | ICD-10-CM | POA: Diagnosis present

## 2023-10-23 DIAGNOSIS — E872 Acidosis, unspecified: Secondary | ICD-10-CM | POA: Diagnosis present

## 2023-10-23 DIAGNOSIS — Z66 Do not resuscitate: Secondary | ICD-10-CM | POA: Diagnosis present

## 2023-10-23 DIAGNOSIS — E1142 Type 2 diabetes mellitus with diabetic polyneuropathy: Secondary | ICD-10-CM | POA: Diagnosis present

## 2023-10-23 DIAGNOSIS — D631 Anemia in chronic kidney disease: Secondary | ICD-10-CM | POA: Diagnosis present

## 2023-10-23 DIAGNOSIS — M25552 Pain in left hip: Secondary | ICD-10-CM | POA: Diagnosis present

## 2023-10-23 DIAGNOSIS — Z8709 Personal history of other diseases of the respiratory system: Secondary | ICD-10-CM

## 2023-10-23 DIAGNOSIS — N189 Chronic kidney disease, unspecified: Secondary | ICD-10-CM | POA: Insufficient documentation

## 2023-10-23 DIAGNOSIS — Z8249 Family history of ischemic heart disease and other diseases of the circulatory system: Secondary | ICD-10-CM

## 2023-10-23 DIAGNOSIS — A411 Sepsis due to other specified staphylococcus: Secondary | ICD-10-CM | POA: Diagnosis not present

## 2023-10-23 DIAGNOSIS — T730XXA Starvation, initial encounter: Secondary | ICD-10-CM | POA: Diagnosis present

## 2023-10-23 DIAGNOSIS — B372 Candidiasis of skin and nail: Secondary | ICD-10-CM | POA: Diagnosis present

## 2023-10-23 DIAGNOSIS — Z79899 Other long term (current) drug therapy: Secondary | ICD-10-CM

## 2023-10-23 DIAGNOSIS — N3000 Acute cystitis without hematuria: Secondary | ICD-10-CM | POA: Insufficient documentation

## 2023-10-23 DIAGNOSIS — Z604 Social exclusion and rejection: Secondary | ICD-10-CM | POA: Diagnosis present

## 2023-10-23 DIAGNOSIS — Y92009 Unspecified place in unspecified non-institutional (private) residence as the place of occurrence of the external cause: Secondary | ICD-10-CM

## 2023-10-23 DIAGNOSIS — B879 Myiasis, unspecified: Secondary | ICD-10-CM

## 2023-10-23 DIAGNOSIS — N179 Acute kidney failure, unspecified: Secondary | ICD-10-CM | POA: Diagnosis present

## 2023-10-23 DIAGNOSIS — M6282 Rhabdomyolysis: Secondary | ICD-10-CM | POA: Diagnosis present

## 2023-10-23 DIAGNOSIS — E66813 Obesity, class 3: Secondary | ICD-10-CM | POA: Diagnosis present

## 2023-10-23 DIAGNOSIS — R6520 Severe sepsis without septic shock: Secondary | ICD-10-CM | POA: Diagnosis present

## 2023-10-23 DIAGNOSIS — J189 Pneumonia, unspecified organism: Secondary | ICD-10-CM | POA: Diagnosis present

## 2023-10-23 DIAGNOSIS — W19XXXA Unspecified fall, initial encounter: Secondary | ICD-10-CM | POA: Diagnosis present

## 2023-10-23 DIAGNOSIS — Z5982 Transportation insecurity: Secondary | ICD-10-CM

## 2023-10-23 DIAGNOSIS — J449 Chronic obstructive pulmonary disease, unspecified: Secondary | ICD-10-CM | POA: Diagnosis present

## 2023-10-23 DIAGNOSIS — Z91048 Other nonmedicinal substance allergy status: Secondary | ICD-10-CM

## 2023-10-23 DIAGNOSIS — Z87891 Personal history of nicotine dependence: Secondary | ICD-10-CM

## 2023-10-23 DIAGNOSIS — L89890 Pressure ulcer of other site, unstageable: Secondary | ICD-10-CM | POA: Diagnosis present

## 2023-10-23 DIAGNOSIS — R748 Abnormal levels of other serum enzymes: Secondary | ICD-10-CM | POA: Diagnosis present

## 2023-10-23 DIAGNOSIS — R54 Age-related physical debility: Secondary | ICD-10-CM | POA: Diagnosis present

## 2023-10-23 DIAGNOSIS — N1832 Chronic kidney disease, stage 3b: Secondary | ICD-10-CM | POA: Diagnosis present

## 2023-10-23 DIAGNOSIS — I129 Hypertensive chronic kidney disease with stage 1 through stage 4 chronic kidney disease, or unspecified chronic kidney disease: Secondary | ICD-10-CM | POA: Diagnosis present

## 2023-10-23 DIAGNOSIS — S72001K Fracture of unspecified part of neck of right femur, subsequent encounter for closed fracture with nonunion: Secondary | ICD-10-CM | POA: Diagnosis present

## 2023-10-23 DIAGNOSIS — I700 Atherosclerosis of aorta: Secondary | ICD-10-CM | POA: Diagnosis present

## 2023-10-23 DIAGNOSIS — R7401 Elevation of levels of liver transaminase levels: Secondary | ICD-10-CM | POA: Diagnosis present

## 2023-10-23 DIAGNOSIS — Z6841 Body Mass Index (BMI) 40.0 and over, adult: Secondary | ICD-10-CM

## 2023-10-23 LAB — COMPREHENSIVE METABOLIC PANEL WITH GFR
ALT: 48 U/L — ABNORMAL HIGH (ref 0–44)
AST: 129 U/L — ABNORMAL HIGH (ref 15–41)
Albumin: 2.2 g/dL — ABNORMAL LOW (ref 3.5–5.0)
Alkaline Phosphatase: 61 U/L (ref 38–126)
Anion gap: 15 (ref 5–15)
BUN: 39 mg/dL — ABNORMAL HIGH (ref 8–23)
CO2: 17 mmol/L — ABNORMAL LOW (ref 22–32)
Calcium: 8.8 mg/dL — ABNORMAL LOW (ref 8.9–10.3)
Chloride: 109 mmol/L (ref 98–111)
Creatinine, Ser: 1.87 mg/dL — ABNORMAL HIGH (ref 0.44–1.00)
GFR, Estimated: 28 mL/min — ABNORMAL LOW (ref >60)
Glucose, Bld: 144 mg/dL — ABNORMAL HIGH (ref 70–99)
Potassium: 4.2 mmol/L (ref 3.5–5.1)
Sodium: 141 mmol/L (ref 135–145)
Total Bilirubin: 1.3 mg/dL — ABNORMAL HIGH (ref 0.0–1.2)
Total Protein: 6.2 g/dL — ABNORMAL LOW (ref 6.5–8.1)

## 2023-10-23 LAB — CBC WITH DIFFERENTIAL/PLATELET
Abs Immature Granulocytes: 0.35 10*3/uL — ABNORMAL HIGH (ref 0.00–0.07)
Basophils Absolute: 0.1 10*3/uL (ref 0.0–0.1)
Basophils Relative: 0 %
Eosinophils Absolute: 0 10*3/uL (ref 0.0–0.5)
Eosinophils Relative: 0 %
HCT: 40.9 % (ref 36.0–46.0)
Hemoglobin: 12.8 g/dL (ref 12.0–15.0)
Immature Granulocytes: 1 %
Lymphocytes Relative: 5 %
Lymphs Abs: 1.2 10*3/uL (ref 0.7–4.0)
MCH: 27.6 pg (ref 26.0–34.0)
MCHC: 31.3 g/dL (ref 30.0–36.0)
MCV: 88.3 fL (ref 80.0–100.0)
Monocytes Absolute: 1.9 10*3/uL — ABNORMAL HIGH (ref 0.1–1.0)
Monocytes Relative: 7 %
Neutro Abs: 23.2 10*3/uL — ABNORMAL HIGH (ref 1.7–7.7)
Neutrophils Relative %: 87 %
Platelets: 471 10*3/uL — ABNORMAL HIGH (ref 150–400)
RBC: 4.63 MIL/uL (ref 3.87–5.11)
RDW: 13.6 % (ref 11.5–15.5)
WBC: 26.7 10*3/uL — ABNORMAL HIGH (ref 4.0–10.5)
nRBC: 0 % (ref 0.0–0.2)

## 2023-10-23 LAB — I-STAT CG4 LACTIC ACID, ED
Lactic Acid, Venous: 3.9 mmol/L (ref 0.5–1.9)
Lactic Acid, Venous: 2.4 mmol/L (ref 0.5–1.9)

## 2023-10-23 LAB — PROTIME-INR
INR: 1.2 (ref 0.8–1.2)
Prothrombin Time: 15.7 s — ABNORMAL HIGH (ref 11.4–15.2)

## 2023-10-23 LAB — CBG MONITORING, ED: Glucose-Capillary: 138 mg/dL — ABNORMAL HIGH (ref 70–99)

## 2023-10-23 LAB — CK: Total CK: 3076 U/L — ABNORMAL HIGH (ref 38–234)

## 2023-10-23 MED ORDER — LACTATED RINGERS IV SOLN: INTRAVENOUS | Status: DC

## 2023-10-23 MED ORDER — SODIUM CHLORIDE 0.9 % IV SOLN
2.0000 g | Freq: Once | INTRAVENOUS | Status: AC
  Administered 2023-10-23: 2 g via INTRAVENOUS
  Filled 2023-10-23: qty 12.5

## 2023-10-23 MED ORDER — VANCOMYCIN HCL IN DEXTROSE 1-5 GM/200ML-% IV SOLN: 1000.0000 mg | Freq: Once | INTRAVENOUS | Status: DC

## 2023-10-23 MED ORDER — METRONIDAZOLE 500 MG/100ML IV SOLN
500.0000 mg | Freq: Once | INTRAVENOUS | Status: AC
  Administered 2023-10-23: 500 mg via INTRAVENOUS
  Filled 2023-10-23: qty 100

## 2023-10-23 MED ORDER — LACTATED RINGERS IV BOLUS (SEPSIS)
1000.0000 mL | Freq: Once | INTRAVENOUS | Status: AC
  Administered 2023-10-23: 1000 mL via INTRAVENOUS

## 2023-10-23 MED ORDER — VANCOMYCIN HCL 2000 MG/400ML IV SOLN
2000.0000 mg | Freq: Once | INTRAVENOUS | Status: AC
  Administered 2023-10-23: 2000 mg via INTRAVENOUS
  Filled 2023-10-23: qty 400

## 2023-10-23 MED ORDER — HYDROMORPHONE HCL 1 MG/ML IJ SOLN
1.0000 mg | Freq: Once | INTRAMUSCULAR | Status: AC
  Administered 2023-10-23: 1 mg via INTRAVENOUS

## 2023-10-23 MED ORDER — HYDROMORPHONE HCL 1 MG/ML IJ SOLN
2.0000 mg | Freq: Once | INTRAMUSCULAR | Status: DC
  Filled 2023-10-23: qty 2

## 2023-10-23 NOTE — ED Triage Notes (Signed)
 Pt from home via EMS, called out for welfare check.  Pt found lying face down on L side in bathroom.  Unknown downtime but assumed to be 2-3 days.  Pt excoriated, covered in feces and urine.

## 2023-10-23 NOTE — Sepsis Progress Note (Signed)
 Elink following for sepsis protocol.

## 2023-10-23 NOTE — ED Provider Notes (Signed)
 [facility] Provider Note   CSN: [id number] Arrival date & time: 10/23/23  2029     History {Add pertinent medical, surgical, social history, OB history to HPI:1} Chief Complaint  Patient presents with   Code Sepsis    [name] [name] is a 73 y.o. female with past medical history of COPD, HTN, bipolar disorder, T2DM, BMI 47 presents to emergency department via EMS following being called out for a welfare check.  Patient was found laying on her left side of the bathroom. Patient was found on ground in her own feces and urine with maggots crawling on her by EMS. Unknown down time or when she fell.  Sister [name] [name]) at bedside reported that patient normally calls once a week so called a welfare check on her when she didn't hear from her. She last spoke on phone last Saturday and patient appeared at her baseline  HPI     Home Medications Prior to Admission medications   Medication Sig Start Date End Date Taking? Authorizing Provider  acetaminophen  (TYLENOL ) 325 MG tablet Take 2 tablets (650 mg total) by mouth every 4 (four) hours as needed. 06/09/22   Marisela Sicks, PA-C  bisacodyl  (DULCOLAX) 10 MG suppository Place 1 suppository (10 mg total) rectally daily as needed for moderate constipation. 06/09/22   Oral Billings, MD  Calcium Carbonate-Vitamin D  (CALTRATE 600+D PO) Take 2 tablets by mouth every evening.  Patient not taking: Reported on 06/01/2022    [provider]  Carboxymethylcellul-Glycerin (REFRESH OPTIVE OP) Apply 4-5 drops to eye daily as needed (dry eyes).     [provider]  cholecalciferol  (VITAMIN D ) 1000 UNITS tablet Take 2,000 Units by mouth daily.     [provider]  clotrimazole  (LOTRIMIN ) 1 % cream Apply to affected area 2 times daily 07/09/22   Steinl, Kevin, MD  clotrimazole -betamethasone (LOTRISONE) cream Apply 1 application topically daily as needed (sores).    [provider]  docusate sodium  100 MG CAPS Take 100 mg by mouth 2 (two) times daily. 03/30/12   Equilla Hastings, PA-C  enoxaparin  (LOVENOX ) 80 MG/0.8ML injection Inject 0.7 mLs (70 mg total) into the skin daily for 14 days. 06/10/22 06/24/22  Marisela Sicks, PA-C  ferrous sulfate  325 (65 FE) MG tablet Take 650 mg by mouth daily.     [provider]  gabapentin  (NEURONTIN ) 600 MG tablet Take 600-1,200 mg by mouth 2 (two) times daily. Take 600 mg by mouth in the morning and take 1200 mg by mouth in the evening.    [provider]  Ipratropium-Albuterol  (COMBIVENT  RESPIMAT) 20-100 MCG/ACT AERS respimat Inhale 2 puffs into the lungs 2 (two) times daily as needed for wheezing.     [provider]  levothyroxine  (SYNTHROID ) 25 MCG tablet Take 25 mcg by mouth daily before breakfast.    [provider]  lisinopril  (PRINIVIL ,ZESTRIL ) 20 MG tablet Take 20 mg by mouth daily.     [provider]  metFORMIN  (GLUCOPHAGE ) 500 MG tablet Take 500 mg by mouth 2 (two) times daily.     [provider]  Multiple Vitamins-Minerals (CENTRUM SILVER ULTRA WOMENS) TABS Take 1 tablet by mouth 2 (two) times daily.     [provider]  neomycin-polymyxin-pramoxine (NEOSPORIN PLUS) 1 % cream Apply 1 application  topically daily as needed (wound care).    [provider]  Omega-3 Fatty Acids (OMEGA 3 500 PO) Take 500 mg by mouth 2 (two)  times daily.    [provider]  omeprazole (PRILOSEC) 20 MG capsule Take 20 mg by mouth at bedtime.     [provider]  polyethylene glycol (MIRALAX  / GLYCOLAX ) 17 g packet Take 17 g by mouth daily. 06/10/22   Oral Billings, MD  senna-docusate (SENOKOT-S) 8.6-50 MG tablet Take 2 tablets by mouth 2 (two) times daily. 06/09/22   Oral Billings, MD      Allergies    Coffee flavoring agent (non-screening), Prednisone, and Other    Review of Systems   Review of Systems  Constitutional:  Negative for chills,  fatigue and fever.  Respiratory:  Negative for cough, chest tightness, shortness of breath and wheezing.   Cardiovascular:  Negative for chest pain and palpitations.  Gastrointestinal:  Negative for abdominal pain, constipation, diarrhea, nausea and vomiting.  Neurological:  Negative for dizziness, seizures, weakness, light-headedness, numbness and headaches.    Physical Exam Updated Vital Signs BP (!) 83/73 (BP Location: Right Arm)   Pulse (!) 109   Temp 99.8 F (37.7 C) (Oral)   Resp (!) 24   Ht 5\' 2"  (1.575 m)   Wt 117.8 kg   SpO2 96%   BMI 47.50 kg/m  Physical Exam Vitals and nursing note reviewed.  Constitutional:      General: She is not in acute distress.    Appearance: Normal appearance. She is not diaphoretic.  HENT:     Head: Normocephalic and atraumatic. No raccoon eyes or Battle's sign.     Comments: No hematoma No crepitus to facial bones    Right Ear: External ear normal. No hemotympanum.     Left Ear: External ear normal. No hemotympanum.     Nose: Nose normal.     Right Nostril: No epistaxis or septal hematoma.     Left Nostril: No epistaxis or septal hematoma.     Mouth/Throat:     Mouth: Mucous membranes are dry. No injury or lacerations.  Eyes:     General:        Right eye: No discharge.        Left eye: No discharge.     Extraocular Movements: Extraocular movements intact.     Conjunctiva/sclera: Conjunctivae normal.     Pupils: Pupils are equal, round, and reactive to light.     Comments: No subconjunctival hemorrhage, hyphema, tear drop pupil, or fluid leakage bilaterally  Neck:     Vascular: No carotid bruit.  Cardiovascular:     Rate and Rhythm: Normal rate.     Pulses: Normal pulses.          Radial pulses are 2+ on the right side and 2+ on the left side.       Dorsalis pedis pulses are 2+ on the right side and 2+ on the left side.  Pulmonary:     Effort: Pulmonary effort is normal. No respiratory distress.     Breath sounds: Normal breath  sounds. No wheezing.  Chest:     Chest wall: No tenderness.  Abdominal:     General: Bowel sounds are normal. There is no distension.     Palpations: Abdomen is soft.     Tenderness: There is no abdominal tenderness. There is no guarding or rebound.  Musculoskeletal:     Cervical back: Full passive range of motion without pain, normal range of motion and neck supple. No deformity, rigidity or bony tenderness. Normal range of motion.     Thoracic back: No deformity or  bony tenderness. Normal range of motion.     Lumbar back: No deformity or bony tenderness. Normal range of motion.     Right hip: No bony tenderness or crepitus.     Left hip: No bony tenderness or crepitus.     Right lower leg: No edema.     Left lower leg: No edema.     Comments: No obvious deformity to joints or long bones Pelvis stable with no shortening or rotation of LE bilaterally  Skin:    General: Skin is warm and dry.     Capillary Refill: Capillary refill takes less than 2 seconds.     Comments: Erythematous rash to with various areas of purulent drainage  Neurological:     General: No focal deficit present.     Mental Status: She is alert and oriented to person, place, and time. Mental status is at baseline.     GCS: GCS eye subscore is 4. GCS verbal subscore is 5. GCS motor subscore is 6.     Cranial Nerves: Cranial nerves 2-12 are intact. No cranial nerve deficit.     Sensory: Sensation is intact. No sensory deficit.     Motor: Motor function is intact. No weakness or tremor.     Coordination: Coordination is intact. Coordination normal. Finger-Nose-Finger Test and Heel to [facility] Test normal.     Gait: Gait is intact. Gait normal.     Deep Tendon Reflexes: Reflexes are normal and symmetric. Reflexes normal.     Comments: Acting following commands appropriately     ED Results / Procedures / Treatments   Labs (all labs ordered are listed, but only abnormal results are displayed) Labs Reviewed  CULTURE,  BLOOD (ROUTINE X 2)  CULTURE, BLOOD (ROUTINE X 2)  COMPREHENSIVE METABOLIC PANEL WITH GFR  CBC WITH DIFFERENTIAL/PLATELET  PROTIME-INR  URINALYSIS, W/ REFLEX TO CULTURE (INFECTION SUSPECTED)  I-STAT CG4 LACTIC ACID, ED    EKG None  Radiology No results found.  Procedures Procedures  {Document cardiac monitor, telemetry assessment procedure when appropriate:1}  Medications Ordered in ED Medications  lactated ringers  infusion (has no administration in time range)  lactated ringers  bolus 1,000 mL (has no administration in time range)    ED Course/ Medical Decision Making/ A&P   {   Click here for ABCD2, HEART and other calculatorsREFRESH Note before signing :1}                              Medical Decision Making Amount and/or Complexity of Data Reviewed Labs: ordered. Radiology: ordered.  Risk Prescription drug management.   ***  {Document critical care time when appropriate:1} {Document review of labs and clinical decision tools ie heart score, Chads2Vasc2 etc:1}  {Document your independent review of radiology images, and any outside records:1} {Document your discussion with family members, caretakers, and with consultants:1} {Document social determinants of health affecting pt's care:1} {Document your decision making why or why not admission, treatments were needed:1} Final Clinical Impression(s) / ED Diagnoses Final diagnoses:  None    Rx / DC Orders ED Discharge Orders     None

## 2023-10-23 NOTE — ED Notes (Signed)
 Pt with significant skin breakdown on upper legs, abd, and chest. Maggots noted to left abd under skin folds. Bed bath given on arrival

## 2023-10-24 ENCOUNTER — Encounter (HOSPITAL_COMMUNITY): Payer: Self-pay | Admitting: Internal Medicine

## 2023-10-24 DIAGNOSIS — R531 Weakness: Secondary | ICD-10-CM | POA: Diagnosis not present

## 2023-10-24 DIAGNOSIS — Y92009 Unspecified place in unspecified non-institutional (private) residence as the place of occurrence of the external cause: Secondary | ICD-10-CM | POA: Diagnosis not present

## 2023-10-24 DIAGNOSIS — A419 Sepsis, unspecified organism: Secondary | ICD-10-CM | POA: Diagnosis not present

## 2023-10-24 DIAGNOSIS — R7881 Bacteremia: Secondary | ICD-10-CM | POA: Diagnosis not present

## 2023-10-24 DIAGNOSIS — E66813 Obesity, class 3: Secondary | ICD-10-CM | POA: Diagnosis present

## 2023-10-24 DIAGNOSIS — T796XXA Traumatic ischemia of muscle, initial encounter: Secondary | ICD-10-CM

## 2023-10-24 DIAGNOSIS — N1832 Chronic kidney disease, stage 3b: Secondary | ICD-10-CM | POA: Diagnosis present

## 2023-10-24 DIAGNOSIS — Z6841 Body Mass Index (BMI) 40.0 and over, adult: Secondary | ICD-10-CM | POA: Diagnosis not present

## 2023-10-24 DIAGNOSIS — J9601 Acute respiratory failure with hypoxia: Secondary | ICD-10-CM | POA: Diagnosis present

## 2023-10-24 DIAGNOSIS — R7401 Elevation of levels of liver transaminase levels: Secondary | ICD-10-CM | POA: Insufficient documentation

## 2023-10-24 DIAGNOSIS — J410 Simple chronic bronchitis: Secondary | ICD-10-CM

## 2023-10-24 DIAGNOSIS — L249 Irritant contact dermatitis, unspecified cause: Secondary | ICD-10-CM | POA: Diagnosis not present

## 2023-10-24 DIAGNOSIS — E038 Other specified hypothyroidism: Secondary | ICD-10-CM

## 2023-10-24 DIAGNOSIS — L0390 Cellulitis, unspecified: Secondary | ICD-10-CM | POA: Insufficient documentation

## 2023-10-24 DIAGNOSIS — E872 Acidosis, unspecified: Secondary | ICD-10-CM | POA: Insufficient documentation

## 2023-10-24 DIAGNOSIS — W19XXXA Unspecified fall, initial encounter: Secondary | ICD-10-CM

## 2023-10-24 DIAGNOSIS — N3000 Acute cystitis without hematuria: Secondary | ICD-10-CM | POA: Insufficient documentation

## 2023-10-24 DIAGNOSIS — B879 Myiasis, unspecified: Secondary | ICD-10-CM

## 2023-10-24 DIAGNOSIS — G9341 Metabolic encephalopathy: Secondary | ICD-10-CM | POA: Diagnosis present

## 2023-10-24 DIAGNOSIS — J189 Pneumonia, unspecified organism: Secondary | ICD-10-CM | POA: Diagnosis present

## 2023-10-24 DIAGNOSIS — T796XXS Traumatic ischemia of muscle, sequela: Secondary | ICD-10-CM | POA: Diagnosis not present

## 2023-10-24 DIAGNOSIS — E1122 Type 2 diabetes mellitus with diabetic chronic kidney disease: Secondary | ICD-10-CM | POA: Diagnosis present

## 2023-10-24 DIAGNOSIS — Z8709 Personal history of other diseases of the respiratory system: Secondary | ICD-10-CM

## 2023-10-24 DIAGNOSIS — N179 Acute kidney failure, unspecified: Secondary | ICD-10-CM

## 2023-10-24 DIAGNOSIS — E785 Hyperlipidemia, unspecified: Secondary | ICD-10-CM | POA: Insufficient documentation

## 2023-10-24 DIAGNOSIS — B958 Unspecified staphylococcus as the cause of diseases classified elsewhere: Secondary | ICD-10-CM | POA: Diagnosis not present

## 2023-10-24 DIAGNOSIS — L03039 Cellulitis of unspecified toe: Secondary | ICD-10-CM

## 2023-10-24 DIAGNOSIS — R6520 Severe sepsis without septic shock: Secondary | ICD-10-CM | POA: Insufficient documentation

## 2023-10-24 DIAGNOSIS — F319 Bipolar disorder, unspecified: Secondary | ICD-10-CM | POA: Diagnosis present

## 2023-10-24 DIAGNOSIS — I350 Nonrheumatic aortic (valve) stenosis: Secondary | ICD-10-CM | POA: Diagnosis not present

## 2023-10-24 DIAGNOSIS — D631 Anemia in chronic kidney disease: Secondary | ICD-10-CM | POA: Diagnosis present

## 2023-10-24 DIAGNOSIS — E039 Hypothyroidism, unspecified: Secondary | ICD-10-CM | POA: Diagnosis present

## 2023-10-24 DIAGNOSIS — Z66 Do not resuscitate: Secondary | ICD-10-CM | POA: Diagnosis present

## 2023-10-24 DIAGNOSIS — L89890 Pressure ulcer of other site, unstageable: Secondary | ICD-10-CM | POA: Diagnosis present

## 2023-10-24 DIAGNOSIS — M6282 Rhabdomyolysis: Secondary | ICD-10-CM | POA: Diagnosis present

## 2023-10-24 DIAGNOSIS — E119 Type 2 diabetes mellitus without complications: Secondary | ICD-10-CM

## 2023-10-24 DIAGNOSIS — B954 Other streptococcus as the cause of diseases classified elsewhere: Secondary | ICD-10-CM | POA: Diagnosis not present

## 2023-10-24 DIAGNOSIS — I129 Hypertensive chronic kidney disease with stage 1 through stage 4 chronic kidney disease, or unspecified chronic kidney disease: Secondary | ICD-10-CM | POA: Diagnosis present

## 2023-10-24 DIAGNOSIS — E11649 Type 2 diabetes mellitus with hypoglycemia without coma: Secondary | ICD-10-CM | POA: Diagnosis present

## 2023-10-24 DIAGNOSIS — E1142 Type 2 diabetes mellitus with diabetic polyneuropathy: Secondary | ICD-10-CM | POA: Diagnosis present

## 2023-10-24 DIAGNOSIS — A411 Sepsis due to other specified staphylococcus: Secondary | ICD-10-CM | POA: Diagnosis present

## 2023-10-24 DIAGNOSIS — Z515 Encounter for palliative care: Secondary | ICD-10-CM | POA: Diagnosis not present

## 2023-10-24 DIAGNOSIS — J440 Chronic obstructive pulmonary disease with acute lower respiratory infection: Secondary | ICD-10-CM | POA: Diagnosis present

## 2023-10-24 DIAGNOSIS — N189 Chronic kidney disease, unspecified: Secondary | ICD-10-CM | POA: Insufficient documentation

## 2023-10-24 DIAGNOSIS — I700 Atherosclerosis of aorta: Secondary | ICD-10-CM | POA: Diagnosis present

## 2023-10-24 LAB — BLOOD CULTURE ID PANEL (REFLEXED) - BCID2

## 2023-10-24 LAB — URINALYSIS, W/ REFLEX TO CULTURE (INFECTION SUSPECTED)
Bilirubin Urine: NEGATIVE
Glucose, UA: NEGATIVE mg/dL
Ketones, ur: 5 mg/dL — AB
Nitrite: NEGATIVE
Protein, ur: 30 mg/dL — AB
Specific Gravity, Urine: 1.025 (ref 1.005–1.030)
Squamous Epithelial / HPF: >50 /HPF (ref 0–5)
WBC, UA: >50 WBC/hpf (ref 0–5)
pH: 5 (ref 5.0–8.0)

## 2023-10-24 LAB — GLUCOSE, CAPILLARY
Glucose-Capillary: 122 mg/dL — ABNORMAL HIGH (ref 70–99)
Glucose-Capillary: 62 mg/dL — ABNORMAL LOW (ref 70–99)
Glucose-Capillary: 71 mg/dL (ref 70–99)

## 2023-10-24 LAB — RESPIRATORY PANEL BY PCR

## 2023-10-24 LAB — URINE CULTURE: Culture: NO GROWTH

## 2023-10-24 LAB — STREP PNEUMONIAE URINARY ANTIGEN: Strep Pneumo Urinary Antigen: NEGATIVE

## 2023-10-24 LAB — MRSA NEXT GEN BY PCR, NASAL: MRSA by PCR Next Gen: NOT DETECTED

## 2023-10-24 LAB — CBC
HCT: 36.5 % (ref 36.0–46.0)
Hemoglobin: 11.2 g/dL — ABNORMAL LOW (ref 12.0–15.0)
MCH: 27.9 pg (ref 26.0–34.0)
MCHC: 30.7 g/dL (ref 30.0–36.0)
MCV: 90.8 fL (ref 80.0–100.0)
Platelets: 388 10*3/uL (ref 150–400)
RBC: 4.02 MIL/uL (ref 3.87–5.11)
RDW: 13.7 % (ref 11.5–15.5)
WBC: 22 10*3/uL — ABNORMAL HIGH (ref 4.0–10.5)
nRBC: 0 % (ref 0.0–0.2)

## 2023-10-24 LAB — COMPREHENSIVE METABOLIC PANEL WITH GFR
ALT: 44 U/L (ref 0–44)
AST: 104 U/L — ABNORMAL HIGH (ref 15–41)
Albumin: 1.9 g/dL — ABNORMAL LOW (ref 3.5–5.0)
Alkaline Phosphatase: 47 U/L (ref 38–126)
Anion gap: 12 (ref 5–15)
BUN: 37 mg/dL — ABNORMAL HIGH (ref 8–23)
CO2: 19 mmol/L — ABNORMAL LOW (ref 22–32)
Calcium: 8.4 mg/dL — ABNORMAL LOW (ref 8.9–10.3)
Chloride: 112 mmol/L — ABNORMAL HIGH (ref 98–111)
Creatinine, Ser: 1.59 mg/dL — ABNORMAL HIGH (ref 0.44–1.00)
GFR, Estimated: 34 mL/min — ABNORMAL LOW (ref >60)
Glucose, Bld: 139 mg/dL — ABNORMAL HIGH (ref 70–99)
Potassium: 4 mmol/L (ref 3.5–5.1)
Sodium: 143 mmol/L (ref 135–145)
Total Bilirubin: 1 mg/dL (ref 0.0–1.2)
Total Protein: 5.2 g/dL — ABNORMAL LOW (ref 6.5–8.1)

## 2023-10-24 LAB — CK: Total CK: 2598 U/L — ABNORMAL HIGH (ref 38–234)

## 2023-10-24 LAB — PROCALCITONIN: Procalcitonin: 1.11 ng/mL

## 2023-10-24 LAB — HEPATITIS PANEL, ACUTE
HCV Ab: NONREACTIVE
Hep A IgM: NONREACTIVE
Hep B C IgM: NONREACTIVE
Hepatitis B Surface Ag: NONREACTIVE

## 2023-10-24 LAB — TSH: TSH: 1.671 u[IU]/mL (ref 0.350–4.500)

## 2023-10-24 LAB — D-DIMER, QUANTITATIVE: D-Dimer, Quant: 8.69 ug{FEU}/mL — ABNORMAL HIGH (ref 0.00–0.50)

## 2023-10-24 LAB — RPR: RPR Ser Ql: NONREACTIVE

## 2023-10-24 LAB — LACTIC ACID, PLASMA
Lactic Acid, Venous: 2.6 mmol/L (ref 0.5–1.9)
Lactic Acid, Venous: >9 mmol/L (ref 0.5–1.9)
Lactic Acid, Venous: 4.8 mmol/L (ref 0.5–1.9)
Lactic Acid, Venous: 2.1 mmol/L (ref 0.5–1.9)

## 2023-10-24 LAB — CBG MONITORING, ED
Glucose-Capillary: 128 mg/dL — ABNORMAL HIGH (ref 70–99)
Glucose-Capillary: 78 mg/dL (ref 70–99)
Glucose-Capillary: 77 mg/dL (ref 70–99)

## 2023-10-24 LAB — VITAMIN B12: Vitamin B-12: 1840 pg/mL — ABNORMAL HIGH (ref 180–914)

## 2023-10-24 LAB — AMMONIA: Ammonia: 16 umol/L (ref 9–35)

## 2023-10-24 MED ORDER — LACTATED RINGERS IV BOLUS
500.0000 mL | Freq: Once | INTRAVENOUS | Status: AC
  Administered 2023-10-24: 500 mL via INTRAVENOUS

## 2023-10-24 MED ORDER — LACTATED RINGERS IV BOLUS
1000.0000 mL | Freq: Once | INTRAVENOUS | Status: AC
  Administered 2023-10-24: 1000 mL via INTRAVENOUS

## 2023-10-24 MED ORDER — MIDODRINE HCL 5 MG PO TABS: 10.0000 mg | ORAL_TABLET | ORAL | Status: AC

## 2023-10-24 MED ORDER — ONDANSETRON HCL 4 MG/2ML IJ SOLN: 4.0000 mg | Freq: Four times a day (QID) | INTRAMUSCULAR | Status: DC | PRN

## 2023-10-24 MED ORDER — SODIUM CHLORIDE 0.9 % IV SOLN
2.0000 g | Freq: Two times a day (BID) | INTRAVENOUS | Status: DC
  Administered 2023-10-24 – 2023-10-25 (×3): 2 g via INTRAVENOUS
  Filled 2023-10-24 (×3): qty 12.5

## 2023-10-24 MED ORDER — HEPARIN SODIUM (PORCINE) 5000 UNIT/ML IJ SOLN
5000.0000 [IU] | Freq: Three times a day (TID) | INTRAMUSCULAR | Status: DC
  Administered 2023-10-24 – 2023-10-31 (×23): 5000 [IU] via SUBCUTANEOUS
  Filled 2023-10-24 (×23): qty 1

## 2023-10-24 MED ORDER — SILVER SULFADIAZINE 1 % EX CREA
TOPICAL_CREAM | Freq: Every day | CUTANEOUS | Status: DC
  Filled 2023-10-24 (×2): qty 85

## 2023-10-24 MED ORDER — VANCOMYCIN HCL 1.25 G IV SOLR: 1250.0000 mg | INTRAVENOUS | Status: DC

## 2023-10-24 MED ORDER — LACTATED RINGERS IV BOLUS: 1000.0000 mL | INTRAVENOUS | Status: DC

## 2023-10-24 MED ORDER — IPRATROPIUM-ALBUTEROL 0.5-2.5 (3) MG/3ML IN SOLN
3.0000 mL | Freq: Once | RESPIRATORY_TRACT | Status: AC
  Administered 2023-10-24: 3 mL via RESPIRATORY_TRACT
  Filled 2023-10-24: qty 3

## 2023-10-24 MED ORDER — ALBUMIN HUMAN 25 % IV SOLN
50.0000 g | INTRAVENOUS | Status: AC
  Administered 2023-10-24: 50 g via INTRAVENOUS
  Filled 2023-10-24: qty 200

## 2023-10-24 MED ORDER — SODIUM CHLORIDE 0.9% FLUSH: 3.0000 mL | Freq: Two times a day (BID) | INTRAVENOUS | Status: DC

## 2023-10-24 MED ORDER — ONDANSETRON HCL 4 MG PO TABS: 4.0000 mg | ORAL_TABLET | Freq: Four times a day (QID) | ORAL | Status: DC | PRN

## 2023-10-24 MED ORDER — VANCOMYCIN HCL 1500 MG/300ML IV SOLN: 1500.0000 mg | INTRAVENOUS | Status: DC

## 2023-10-24 MED ORDER — SENNOSIDES-DOCUSATE SODIUM 8.6-50 MG PO TABS: 1.0000 | ORAL_TABLET | Freq: Every evening | ORAL | Status: DC | PRN

## 2023-10-24 MED ORDER — INSULIN ASPART 100 UNIT/ML IJ SOLN: 0.0000 [IU] | Freq: Three times a day (TID) | INTRAMUSCULAR | Status: DC

## 2023-10-24 MED ORDER — FLUCONAZOLE IN SODIUM CHLORIDE 200-0.9 MG/100ML-% IV SOLN
200.0000 mg | INTRAVENOUS | Status: DC
  Administered 2023-10-24: 200 mg via INTRAVENOUS
  Filled 2023-10-24: qty 100

## 2023-10-24 MED ORDER — LEVOTHYROXINE SODIUM 100 MCG/5ML IV SOLN: 25.0000 ug | Freq: Every day | INTRAVENOUS | Status: DC

## 2023-10-24 MED ORDER — LACTATED RINGERS IV BOLUS
1000.0000 mL | Freq: Once | INTRAVENOUS | Status: AC
Start: 2023-10-24 — End: 2023-10-24
  Administered 2023-10-24: 1000 mL via INTRAVENOUS

## 2023-10-24 MED ORDER — LACTATED RINGERS IV SOLN: INTRAVENOUS | Status: DC

## 2023-10-24 MED ORDER — DEXTROSE 50 % IV SOLN
50.0000 mL | INTRAVENOUS | Status: DC | PRN
  Administered 2023-10-24: 50 mL via INTRAVENOUS
  Filled 2023-10-24: qty 50

## 2023-10-24 MED ORDER — LEVOTHYROXINE SODIUM 25 MCG PO TABS
25.0000 ug | ORAL_TABLET | Freq: Every day | ORAL | Status: DC
  Administered 2023-10-26 – 2023-10-31 (×6): 25 ug via ORAL
  Filled 2023-10-24 (×7): qty 1

## 2023-10-24 MED ORDER — CLORAZEPATE DIPOTASSIUM 7.5 MG PO TABS: 7.5000 mg | ORAL_TABLET | Freq: Two times a day (BID) | ORAL | Status: DC | PRN

## 2023-10-24 MED ORDER — SODIUM CHLORIDE 0.9% FLUSH
3.0000 mL | INTRAVENOUS | Status: DC | PRN
  Administered 2023-10-24: 3 mL via INTRAVENOUS

## 2023-10-24 MED ORDER — SODIUM CHLORIDE 0.9% FLUSH
3.0000 mL | Freq: Two times a day (BID) | INTRAVENOUS | Status: DC
  Administered 2023-10-24 – 2023-10-31 (×12): 3 mL via INTRAVENOUS

## 2023-10-24 MED ORDER — MEDIHONEY WOUND/BURN DRESSING EX PSTE
1.0000 | PASTE | Freq: Every day | CUTANEOUS | Status: DC
  Administered 2023-10-25 – 2023-10-31 (×7): 1 via TOPICAL
  Filled 2023-10-24 (×2): qty 44

## 2023-10-24 MED ORDER — IPRATROPIUM-ALBUTEROL 0.5-2.5 (3) MG/3ML IN SOLN: 3.0000 mL | RESPIRATORY_TRACT | Status: DC | PRN

## 2023-10-24 MED ORDER — SODIUM CHLORIDE 0.9 % IV SOLN: 250.0000 mL | INTRAVENOUS | Status: DC | PRN

## 2023-10-24 MED ORDER — LEVOTHYROXINE SODIUM 25 MCG PO TABS: 25.0000 ug | ORAL_TABLET | Freq: Every day | ORAL | Status: DC

## 2023-10-24 MED ORDER — ACETAMINOPHEN 10 MG/ML IV SOLN
1000.0000 mg | Freq: Three times a day (TID) | INTRAVENOUS | Status: DC | PRN
  Administered 2023-10-24: 1000 mg via INTRAVENOUS
  Filled 2023-10-24: qty 100

## 2023-10-24 NOTE — Progress Notes (Signed)
 Pharmacy Antibiotic Note  [name] [name] is a 73 y.o. female admitted on 10/23/2023 after welfare check where she was found on ground for unknown period of time.  Pharmacy has been consulted for cefepime/vancomycin  dosing for sepsis.  -CXR: edema vs infection -WBC 26.7, lactic acid 3.9 > 3.4, sCr 1.87 (bl~1) -First doses of antibiotics given -Urine and blood cultures collected; Resp panel and Sputum culture ordered, no MRSA PCR (MRSA neg in 05/2022)  Plan: -Cefepime 2g IV every 12 hours -Vancomycin  2g IV x1 -Vancomycin  1250mg  IV every 48 hours (AUC 487, Vd 0.5, IBW, sCr 1.87) -Order MRSA PCR -Monitor renal function -Follow up signs of clinical improvement, LOT, de-escalation of antibiotics   Height: 5\' 2"  (157.5 cm) Weight: 117.8 kg (259 lb 11.2 oz) IBW/kg (Calculated) : 50.1  Temp (24hrs), Avg:99.6 F (37.6 C), Min:99.3 F (37.4 C), Max:99.8 F (37.7 C)  Recent Labs  Lab 10/23/23 2107 10/23/23 2113 10/23/23 2321  WBC 26.7*  --   --   CREATININE 1.87*  --   --   LATICACIDVEN  --  3.9* 2.4*    Estimated Creatinine Clearance: 32.2 mL/min (A) (by C-G formula based on SCr of 1.87 mg/dL (H)).    Allergies  Allergen Reactions   Coffee Flavoring Agent (Non-Screening) Nausea And Vomiting    Per pt she is "allergic" to the smell of coffee   Prednisone Other (See Comments)    Tightness in chest   Other Other (See Comments)    Plain nystatin  ointment-makes skin get worse Patient is allergic to any petroleum based ointment     Antimicrobials this admission: Cefepime 6/8 >>  Vancomycin  6/8 >>   Microbiology results: 6/8 BCx:  6/8 UCx:   6/9 Sputum: ordered  6/9 MRSA PCR: ordered  Thank you for allowing pharmacy to be a part of this patient's care.  Young Hensen, PharmD, BCPS Clinical Pharmacist 10/24/2023 1:12 AM

## 2023-10-24 NOTE — ED Notes (Signed)
Pt.CBG was 106.

## 2023-10-24 NOTE — ED Notes (Signed)
Lab to add on dimer  

## 2023-10-24 NOTE — Progress Notes (Signed)
 PHARMACY - PHYSICIAN COMMUNICATION CRITICAL VALUE ALERT - BLOOD CULTURE IDENTIFICATION (BCID)  [name] [name] is an 73 y.o. female who presented to [facility] on 10/23/2023 with a chief complaint of being found down for 2-3 days.    Assessment:  One set of blood cultures with staph epi and strep species.  Unsure if true positive contamination right now.  Name of physician (or Provider) Contacted: Dr. Murrel Arnt notified  Current antibiotics: Vancomycin , cefepime, fluconazole  Changes to prescribed antibiotics recommended: Vancomycin  covering organisms.  Would continue the same antibiotics for now. Patient is on recommended antibiotics - No changes needed  Results for orders placed or performed during the hospital encounter of 10/23/23  Blood Culture ID Panel (Reflexed) (Collected: 10/23/2023  9:07 PM)  Result Value Ref Range   Enterococcus faecalis NOT DETECTED NOT DETECTED   Enterococcus Faecium NOT DETECTED NOT DETECTED   Listeria monocytogenes NOT DETECTED NOT DETECTED   Staphylococcus species DETECTED (A) NOT DETECTED   Staphylococcus aureus (BCID) NOT DETECTED NOT DETECTED   Staphylococcus epidermidis DETECTED (A) NOT DETECTED   Staphylococcus lugdunensis NOT DETECTED NOT DETECTED   Streptococcus species DETECTED (A) NOT DETECTED   Streptococcus agalactiae NOT DETECTED NOT DETECTED   Streptococcus pneumoniae NOT DETECTED NOT DETECTED   Streptococcus pyogenes NOT DETECTED NOT DETECTED   A.calcoaceticus-baumannii NOT DETECTED NOT DETECTED   Bacteroides fragilis NOT DETECTED NOT DETECTED   Enterobacterales NOT DETECTED NOT DETECTED   Enterobacter cloacae complex NOT DETECTED NOT DETECTED   Escherichia coli NOT DETECTED NOT DETECTED   Klebsiella aerogenes NOT DETECTED NOT DETECTED   Klebsiella oxytoca NOT DETECTED NOT DETECTED   Klebsiella pneumoniae NOT DETECTED NOT DETECTED   Proteus species NOT DETECTED NOT DETECTED   Salmonella species NOT DETECTED NOT DETECTED   Serratia marcescens  NOT DETECTED NOT DETECTED   Haemophilus influenzae NOT DETECTED NOT DETECTED   Neisseria meningitidis NOT DETECTED NOT DETECTED   Pseudomonas aeruginosa NOT DETECTED NOT DETECTED   Stenotrophomonas maltophilia NOT DETECTED NOT DETECTED   Candida albicans NOT DETECTED NOT DETECTED   Candida auris NOT DETECTED NOT DETECTED   Candida glabrata NOT DETECTED NOT DETECTED   Candida krusei NOT DETECTED NOT DETECTED   Candida parapsilosis NOT DETECTED NOT DETECTED   Candida tropicalis NOT DETECTED NOT DETECTED   Cryptococcus neoformans/gattii NOT DETECTED NOT DETECTED   Methicillin resistance mecA/C DETECTED (A) NOT DETECTED    Sherre Docker 10/24/2023  3:36 PM

## 2023-10-24 NOTE — ED Notes (Signed)
 Spoke with POA [name] who states that she will be here tomorrow with her healthcare paperwork and that her sister should be coming back to visit this afternoon.  She states that the pt can be a little hard headed sometimes so it's best to approach her gently and if she is not receptive to leave and come back to her a few minutes later.

## 2023-10-24 NOTE — Progress Notes (Signed)
 Progress Note   Patient: [name] [name] VOZ:[id number] DOB: 1951-01-18 DOA: 10/23/2023     0 DOS: the patient was seen and examined on 10/24/2023   Brief hospital course: 73yo with h/o COPD, HTN, HLD, DM with neuropathy, chronic R femur fracture, R tibial fracture s/p ORIF, chronic constipation, bipolar d/o with hoarding behavior, and morbid obesity who presented on 6/8 after being found down during a welfare check.  She was diagnosed with severe sepsis from CAP as well as cellulitis with maggot infestation.    Assessment and Plan:  Severe sepsis-community-acquired pneumonia and generalized cellulitis Acute hypoxic respiratory failure-secondary to pneumonia, O2 sats as low as 84%on 2L [place] O2 Found down in the bathroom, likely down since Tuesday 6/2   Patient found in urine and feces with maggots all over body Patient is hypotensive, tachycardic, tachypneic and O2 sat dropped to 84% room air with elevated lactate, suggestive of severe sepsis Received total 3 L of LR bolus; BP remains soft with elevated lactic so she has been given an additional 1.5 LR bolus and continue LR 150 cc/h as well as albumin  and midodrine 10 mg x 1 Continue broad-spectrum antibiotic coverage with IV vancomycin  and cefepime CT with GGO un BUL, possible atypical infection Pending blood cultures, urine culture, sputum culture, urine Legionella, urine strep antigen, procalcitonin level Admit to progressive care Wound care consulted   Maggot infestation Patient found to be buried in maggots all over her body This has been cleaned while in the ED and none were grossly visible on my evaluation this AM    Acute kidney injury superimposed CKD stage IIIa Recent labs on 5/13 with creatinine 1.09/GFR 53 Elevated creatinine 1.87 on presentation with GFR 28 Prerenal acute kidney injury in the setting of sepsis and volume depletion This is already improved to 1.59/34 this AM Continue maintenance fluids, avoid nephrotoxic agents    Acute metabolic encephalopathy In the setting prolonged downtime with rhabdo and apparent sepsis Appears to be clearing the in ER CT head and CT cervical spine no acute abnormality CT chest showed evidence of pneumonia Consider VQ scan, but we appear to have an appropriate alternative reason for hypoxia at this time so will hold OK to advance diet as tolerated at this time   Rhabdomyolysis Elevated CK from being down for a prolonged period of time Continue maintenance fluid and will recheck CK in the morning   Transaminitis/Hyperbilirubinemia In the setting of dehydration and sepsis plus rhabdomyolysis CT abdomen pelvis showing cholelithiasis without any evidence of cholecystitis Recheck LFTs in AM   History of femoral neck fracture Chronic pain syndrome Chronic pain  S/p tibial ORIF 05/2022 Underwent IM nailing of femur in 2015, has chronic nonunion but orthopedics has previously noted that this was stable Loosened femur hardware noted on imaging   Anemia of chronic disease Monitor CBC   Essential hypertension Hold blood pressure medication in the setting of sepsis and hypotension.   Bipolar disorder Continue prn clorazepate   Hypothyroidism Continue IV -> PO levothyroxine    Non-insulin -dependent DM type II Recent A1c was 6.3, good control despite erratically taking metformin  (will hold) Cover with moderate-scale SSI Carb modified diet    Peripheral neuropathy Resume nightly gabapentin    History of COPD  Continue  supplemental oxygen Continue DuoNeb as needed  Gallstones Large stones with gallbladder distention noted on CT No current concern for acute cholecystitis  Morbid/Class 3 Obesity Body mass index is 47.5 kg/m. Weight loss should be encouraged Outpatient PCP/bariatric medicine f/u encouraged  Significantly low or high BMI is associated with higher medical risk including morbidity and mortality   Disposition This may be a significant problem Per her  sister, she is a Chartered loss adjuster and her house is falling down around her She is unwilling to leave her home and wouldn't allow DSS in the last time they came out Palliative care consulted - will need to talk with patient, sister, and friend/[employer] APS also consulting  DNR I have discussed code status with the patient and family and both are agreement that the patient would not desire resuscitation and would prefer to die a natural death should that situation arise DNR confirmed at the time of admission Patient will need a gold out of facility DNR form at the time of discharge       Subjective: She is now awake and oriented x 2.  She says that she fell on Tuesday and was unable to reach anyone.  She says that she is DNR and asks that I "call [name]."  I attempted to reach [place] without success, will call back.  Also unable to reach her later in the morning.  I called to discuss the patient with her sister.   She has bipolar d/o and has also been a Haematologist.  She is a Chartered loss adjuster, does not want anyone in the house and refuses to go anywhere.  Her mobile home "is the only thing she has left in this world." She is in a wheelchair, wants only when forced to.  She has a phobia of going outside.  She does drive herself to the grocery occasionally after preparing herself the day before.  She has macular degeneration.  She does not take a lot of medications despite being prescribed them.  She has been trying to lose weight.  [name] is a friend, doesn't know that much about her.  She works as an Charity fundraiser for an Scientist, forensic.  She is DNR.  Her sister thinks that she would be better off to be treated and may be better off in a group home, but the patient would prefer to be closed up in a private room where she can listen to her music and do diamond art uninterrupted..  She could have fallen last Tuesday.  She is very stubborn, has fallen through the floor of her trailer in the past, not good at paying bills on  time.   Physical Exam: Vitals:   10/24/23 0645 10/24/23 0700 10/24/23 0715 10/24/23 0730  BP: (!) 116/59 (!) 116/59 (!) 106/59 (!) 107/56  Pulse: 83 81 82 82  Resp: 12 14 13 13   Temp:      TempSrc:      SpO2: 100% 100% 100% 100%  Weight:      Height:         Intake/Output Summary (Last 24 hours) at 10/24/2023 0848 Last data filed at 10/24/2023 0254 Gross per 24 hour  Intake 2700 ml  Output --  Net 2700 ml   Filed Weights   10/23/23 2035  Weight: 117.8 kg    Exam:  General:  Appears ill, frail despite morbid obesity Eyes:  Mild L eyelid irritation vs. injury ENT:  grossly normal hearing, lips & tongue, moderately dry mm; Cardiovascular:  RRR. No LE edema.  Respiratory:   CTA bilaterally with no wheezes/rales/rhonchi.  Normal respiratory effort. Abdomen:  soft, NT, ND, obese Skin: diffuse areas of skin breakdown with some ulcerations, maggots appear to have been cleared Musculoskeletal:  grossly normal tone BUE/BLE, good  ROM, no bony abnormality Psychiatric:  blunted mood and affect, speech quiet but appropriate, AOx2 Neurologic:  unable to effectively perform  Data Reviewed: I have reviewed the patient's lab results since admission.  Pertinent labs for today include:   CO2 19, up from 17 Glucose 139 BUN 37/Creatinine 1.59/GFR 34, improving Albumi n1.9 CK 2598 Lactate 3.9, 2.4, 2.6, >9 Procalcitonin 1.11 WBC 22, down from 26.7 Hgb 11.2 Normal TSH Negative RVP UA: small Hgb, 5 ketones, moderate LE, 30 protein, few bacteria Blood and urine cultures pending    Family Communication: None present; after repeated attempts, I was able to reach her sister by telephone  Disposition: Status is: Inpatient Remains inpatient appropriate because: seriously ill  Planned Discharge Destination: Skilled nursing facility   Total critical care time: 55 minutes Critical care time was exclusive of separately billable procedures and treating other patients. Critical care was  necessary to treat or prevent imminent or life-threatening deterioration. Critical care was time spent personally by me on the following activities: development of treatment plan with patient and/or surrogate as well as nursing, discussions with consultants, evaluation of patient's response to treatment, examination of patient, obtaining history from patient or surrogate, ordering and performing treatments and interventions, ordering and review of laboratory studies, ordering and review of radiographic studies, pulse oximetry and re-evaluation of patient's condition.   Author: Lorita Rosa, MD 10/24/2023 8:45 AM  For on call review [email or web address].

## 2023-10-24 NOTE — ED Notes (Signed)
 CCMD contacted for monitoring

## 2023-10-24 NOTE — Consult Note (Addendum)
 WOC Nurse Consult Note: Reason for Consult: Consult requested for multiple wounds. Pt was found down for a prolonged periosd of time and has multiple pressure injuries to her anterior body as noted below. Turned over to assess sacrum/buttock and posterior body but skin to all these locations is intact. Bilat heels are intact.  Left cheeck with dark red-purple Deep tissue pressure injury; 4X5cm Left inner breast fold with Unstageable pressure injury, 6X3cm, 100% slough/eschar, mod amt tan foul smelling drainage.  Bilat breast and abd and underarms with red moist macerated skin with partial thickness skin loss, appearance is consistent with severe intertrigo and candidiasis, large amt thick white drainage with strong foul odor. Left lower abd with Unstageable pressure injury, 100% slough/eschar .5X2cm DTPI areas are present to left anterior hand, 2X1cm,  Left knee2X4cm Right knee 1X3cm Bilat anterior toes Left outer foot Right anterior foot Left anterior upper breast with loose peeling skin and red moist wound bed; affected area 13X4cm, mod amt yellow drainage Left anterior thigh red and moist and blistered with intact skin Inner perineum and bilat inner thighs red, moist and macerated with mod amt yellow drainage; appearance is consistent with a severe Idris of moisture associated skin damage. Pressure Injury POA: Yes  Dressing procedure/placement/frequency: Pt is currently on a stretcher in the ED and has a high BMI.  Bariatric air mattress ordered when she is transferred to the floor.   Secure chat sent to the primary team as follows, "Pt has a sever Shiflett of candidiasis, please order IV Fluconazole if you agree."  PT STATES SHE IS ALLERGIC TO ALL PETROLEUM PRODUCTS, DO NOT USE VASELINE GAUZE OR XEROFORM Topical treatment orders provided for bedside nurses to perform as follows:  1. Apply Medihoney Q day  to left cheek, left inner breast fold wound , left lower abd wound and bilat knees.  Cover  with foam dressing.  Change foam dressings Q 3 days or PRN soiling 2. Apply Silvadene to anterior left breast and left thigh blistered areas Q day, then cover with ABD pad and tape.  Wipe away previous Silvadene with moist gauze each time to remove before applying more. Order Timm Foot # [id number] Measure and cut length of InterDry to fit in skin folds that have skin breakdown  Tuck InterDry fabric into skin folds in a single layer, allow for 2 inches of overhang from skin edges to allow for wicking to occur May remove to bathe; dry area thoroughly and then tuck into affected areas again  Do not apply any creams or ointments when using InterDry DO NOT THROW AWAY FOR 5 DAYS unless soiled with stool DO NOT [facility] product, this will inactivate the silver in the material  New sheet of Interdry should be applied after 5 days of use if patient continues to have skin breakdown Discontinue use of current sheets after 6/13  Please re-consult if further assistance is needed.  Thank-you,  Wiliam Harder MSN, RN, CWOCN, [place], CNS [phone]

## 2023-10-24 NOTE — H&P (Addendum)
 History and Physical    [name] [name] UJW:[id number] DOB: [birth year] DOA: 10/23/2023  PCP: Lory Rough., PA-C   Patient coming from: Home   Chief Complaint:  Chief Complaint  Patient presents with   Code Sepsis   ED TRIAGE note:Pt from home via EMS, called out for welfare check. Pt found lying face down on L side in bathroom. Unknown downtime but assumed to be 2-3 days. Pt excoriated, covered in feces and urine.   HPI:  [name] [name] is a 73 y.o. female with medical history significant of COPD, essential hypertension, hyperlipidemia, DM type II, peripheral neuropathy, chronic right femoral fracture, right proximal tibial fracture status post ORIF, chronic constipation, chronic depressive disorder, bipolar disorder, hypothyroidism, hoarding behavior, vitamin D  deficiency, and morbid obesity presented emergency department via EMS as being called out for a welfare check.  EMS found patient was lying in the bed trauma on the left side.  Patient was found on ground in her own urine and feces with maggots crawling on her seen by EMS.  Unknown downtime and fall.  Sister [name] [name]) at bedside reported that patient normally calls once a week so called a welfare check on her when she didn't hear from her. She last spoke on phone last Saturday and patient appeared at her baseline. Reports patient's house is [facility]" .  During my evaluation at the bedside patient is completely altered able to open eyes with deep sternal rub however unable to form a low voice command.  Limited history from patient.   ED Course:  At presentation to ED patient found hypotensive, tachycardic, hypoxic O2 sat 84% room air and tachypneic.  CMP showing low bicarb 17, elevated creatinine 1.8, elevated AST/ALT/bilirubin and low GFR. Elevated pro time 15 and INR 1.2. CBC showing leukocytosis 26, stable H&H elevated platelet count 71. Blood cultures are in process. Elevated CK 3076. Elevated lactic  acid 3.9 improved to 2.4 after fluid resuscitation. UA shEKG showing sinus tachycardia heart rate 109 abnormal R progression.ow evidence of UTI pending urine culture.  Chest x-ray reflects edema/infection. X-ray pelvis no acute bony abnormality. CT head no acute intracranial abnormality. CT cervical spine no fracture.  CT chest abdomen pelvis finding-  Geographic ground-glass opacities and interlobular septal thickening in the upper lobes. Distribution favors atypical infection. Edema not excluded. Small right and trace left pleural effusions. 2. Distended gallbladder with large gallstones in the gallbladder neck. No definite gallbladder wall thickening or pericholecystic fluid noting limitations of motion. If there is concern for acute cholecystitis, right upper quadrant ultrasound is recommended. 3. Loosening about the right femur hardware. 4. Aortic Atherosclerosis (ICD10-I70.0).  In the ED code sepsis has been activated.  Received 3 L of LR bolus currently on LR 150 cc/h.  Also received IV vancomycin , cefepime and metronidazole.  Physician reported that patient found to have Magod allover and physical exam showing head-to-toe redness.  Hospitalist has been consulted for further evaluation management of sepsis, cellulitis all over abdomen and thigh area, pneumonia, rhabdomyolysis, fall-unknown downtime, AKI, metabolic acidosis, and transaminitis.   Significant labs in the ED: Lab Orders         Blood Culture (routine x 2)         Urine Culture         Expectorated Sputum Assessment w Gram Stain, Rflx to Resp Cult         Respiratory (~20 pathogens) panel by PCR         MRSA Next  Gen by PCR, Nasal         Comprehensive metabolic panel         CBC with Differential         Protime-INR         Urinalysis, w/ Reflex to Culture (Infection Suspected) -Urine, Clean Catch         CK         Lactic acid, plasma         Legionella Pneumophila Serogp 1 Ur Ag         Strep pneumoniae  urinary antigen         D-dimer, quantitative         CK         Comprehensive metabolic panel with GFR         Hepatitis panel, acute         Procalcitonin         TSH         Ammonia         RPR         Vitamin B12         CBC         I-Stat Lactic Acid, ED         CBG monitoring, ED       Review of Systems:  Review of Systems  Unable to perform ROS: Acuity of condition    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   Peripheral neuropathy    Pneumonia    hx of    Shortness of breath    with exertion    Skin yeast infection    history of under bilateral breast,, pannus, inner legs   Sleep apnea    uses bipap- does not know setting s    Past Surgical History:  Procedure Laterality Date   COLONOSCOPY  10/22/2011   Procedure: COLONOSCOPY;  Surgeon: Almeda Aris, MD;  Location: [facility];  Service: Endoscopy;  Laterality: N/A;   COLONOSCOPY WITH PROPOFOL  N/A 01/21/2017   Procedure: COLONOSCOPY WITH PROPOFOL ;  Surgeon: Alvis Jourdain, MD;  Location: [facility];  Service: Endoscopy;  Laterality: N/A;   ESOPHAGOGASTRODUODENOSCOPY  10/22/2011   Procedure: ESOPHAGOGASTRODUODENOSCOPY (EGD);  Surgeon: Almeda Aris, MD;  Location: [facility];  Service: Endoscopy;  Laterality: N/A;   femur fx     FEMUR IM NAIL Right 01/08/2014   Procedure: INTRAMEDULLARY (IM) NAIL RIGHT HIP WITH INFUSED ALLOGRAFTING;  Surgeon: Arlette Lagos, MD;  Location: [facility];  Service: Orthopedics;  Laterality: Right;   HARDWARE REMOVAL  03/27/2012   Procedure: HARDWARE REMOVAL;  Surgeon: Bevin Bucks, MD;  Location: [facility];  Service:  Orthopedics;  Laterality: Right;   HARDWARE REMOVAL Right 01/08/2014   Procedure: HARDWARE REMOVAL RIGHT PROXIMAL FEMUR;  Surgeon: Arlette Lagos, MD;  Location: [facility];  Service: Orthopedics;  Laterality: Right;   HIP FRACTURE SURGERY     INTRAMEDULLARY (IM) NAIL INTERTROCHANTERIC  03/27/2012   Procedure: INTRAMEDULLARY (IM) NAIL INTERTROCHANTRIC;  Surgeon: Azalea Lento  Bernard Brick, MD;  Location: [facility];  Service: Orthopedics;  Laterality: Right;  Possible Exchange with a Shorter Trochantric Nail   OPEN REDUCTION INTERNAL FIXATION (ORIF) TIBIA/FIBULA FRACTURE Right 06/03/2022   Procedure: OPEN REDUCTION INTERNAL FIXATION (ORIF) OF RIGHT TIBIA;  Surgeon: Hardy Lia, MD;  Location: [facility];  Service: Orthopedics;  Laterality: Right;   TONSILLECTOMY       reports that she quit smoking about 15 years ago. She has never used smokeless tobacco. She reports that she does not drink alcohol  and does not use drugs.  Allergies  Allergen Reactions   Coffee Flavoring Agent (Non-Screening) Nausea And Vomiting    Per pt she is "allergic" to the smell of coffee   Prednisone Other (See Comments)    Tightness in chest   Other Other (See Comments)    Plain nystatin  ointment-makes skin get worse Patient is allergic to any petroleum based ointment     Family History  Problem Relation Age of Onset   Arrhythmia Mother    Heart failure Mother    Diabetes Mellitus II Mother    Leukemia Father    Heart attack Father    Heart disease Father    Heart disease Sister    COPD Sister    Heart disease Brother    Other Sister        Factor 5   Leukemia Sister    Other Brother        "instant death"    Prior to Admission medications   Medication Sig Start Date End Date Taking? Authorizing Provider  clorazepate (TRANXENE) 7.5 MG tablet Take 7.5 mg by mouth 2 (two) times daily as needed for anxiety. 04/06/22  Yes [provider]  acetaminophen  (TYLENOL ) 325 MG tablet Take 2 tablets (650 mg total) by mouth  every 4 (four) hours as needed. 06/09/22   [name] Sicks, PA-C  bisacodyl  (DULCOLAX) 10 MG suppository Place 1 suppository (10 mg total) rectally daily as needed for moderate constipation. 06/09/22   Oral Billings, MD  Calcium Carbonate-Vitamin D  (CALTRATE 600+D PO) Take 2 tablets by mouth every evening.  Patient not taking: Reported on 06/01/2022    [provider]  Carboxymethylcellul-Glycerin (REFRESH OPTIVE OP) Apply 4-5 drops to eye daily as needed (dry eyes).     [provider]  cholecalciferol  (VITAMIN D ) 1000 UNITS tablet Take 2,000 Units by mouth daily.     [provider]  clotrimazole  (LOTRIMIN ) 1 % cream Apply to affected area 2 times daily 07/09/22   Steinl, Kevin, MD  clotrimazole -betamethasone (LOTRISONE) cream Apply 1 application topically daily as needed (sores).    [provider]  docusate sodium  100 MG CAPS Take 100 mg by mouth 2 (two) times daily. 03/30/12   Equilla Hastings, PA-C  enoxaparin  (LOVENOX ) 80 MG/0.8ML injection Inject 0.7 mLs (70 mg total) into the skin daily for 14 days. 06/10/22 06/24/22  [name] Sicks, PA-C  ferrous sulfate  325 (65 FE) MG tablet Take 650 mg by mouth daily.     [provider]  gabapentin  (NEURONTIN ) 600 MG tablet Take 600-1,200 mg by mouth 2 (two) times daily. Take 600 mg by mouth in the morning and take 1200 mg by mouth in the evening.    [provider]  Ipratropium-Albuterol  (COMBIVENT  RESPIMAT) 20-100 MCG/ACT AERS respimat Inhale 2 puffs into the lungs 2 (two) times daily as needed for wheezing.     [provider]  levothyroxine  (SYNTHROID ) 25 MCG tablet Take 25 mcg by mouth daily before  breakfast.    [provider]  lisinopril  (PRINIVIL ,ZESTRIL ) 20 MG tablet Take 20 mg by mouth daily.     [provider]  metFORMIN  (GLUCOPHAGE ) 500 MG tablet Take 500 mg by mouth 2 (two) times daily.     [provider]  Multiple Vitamins-Minerals (CENTRUM SILVER ULTRA WOMENS)  TABS Take 1 tablet by mouth 2 (two) times daily.     [provider]  neomycin-polymyxin-pramoxine (NEOSPORIN PLUS) 1 % cream Apply 1 application  topically daily as needed (wound care).    [provider]  Omega-3 Fatty Acids (OMEGA 3 500 PO) Take 500 mg by mouth 2 (two) times daily.    [provider]  omeprazole (PRILOSEC) 20 MG capsule Take 20 mg by mouth at bedtime.     [provider]  polyethylene glycol (MIRALAX  / GLYCOLAX ) 17 g packet Take 17 g by mouth daily. 06/10/22   Oral Billings, MD  senna-docusate (SENOKOT-S) 8.6-50 MG tablet Take 2 tablets by mouth 2 (two) times daily. 06/09/22   Oral Billings, MD     Physical Exam: Vitals:   10/24/23 0045 10/24/23 0100 10/24/23 0115 10/24/23 0130  BP: (!) 86/50 (!) 81/58 (!) 98/49 (!) 91/53  Pulse: 96 95 96 96  Resp: 12 13 12 13   Temp:      TempSrc:      SpO2: 99% 98% 98% 100%  Weight:      Height:        Physical Exam Vitals and nursing note reviewed.  Constitutional:      Appearance: She is obese. She is ill-appearing.     Comments: Ill looking, lethargic and altered mental status.  Able to open eyes with voice command and sternal rub.  HENT:     Mouth/Throat:     Mouth: Mucous membranes are dry.  Cardiovascular:     Rate and Rhythm: Regular rhythm. Tachycardia present.     Pulses: Normal pulses.     Heart sounds: Normal heart sounds.  Pulmonary:     Effort: Pulmonary effort is normal.     Breath sounds: Normal breath sounds. No wheezing.  Abdominal:     Palpations: Abdomen is soft.  Musculoskeletal:     Cervical back: Neck supple.     Right lower leg: No edema.     Left lower leg: No edema.  Skin:    General: Skin is warm and dry.     Capillary Refill: Capillary refill takes less than 2 seconds.     Findings: Erythema, lesion and rash present.  Neurological:     Comments: Unable to assess.  Psychiatric:     Comments: Unable to assess       Media Information   Document  Information  Photos    10/23/2023 23:33  Attached To:  Hospital Encounter on 10/23/23  Source Information  Royann Cords, PA  [facility]  Document History     Media Information   Document Information  Photos  Lower legs  10/23/2023 23:32  Attached To:  Hospital Encounter on 10/23/23  Source Information  Royann Cords, PA  [facility]  Document History    Labs on Admission: I have personally reviewed following labs and imaging studies  CBC: Recent Labs  Lab 10/23/23 2107  WBC 26.7*  NEUTROABS 23.2*  HGB 12.8  HCT 40.9  MCV 88.3  PLT 471*   Basic Metabolic Panel: Recent Labs  Lab 10/23/23 2107  NA 141  K 4.2  CL  109  CO2 17*  GLUCOSE 144*  BUN 39*  CREATININE 1.87*  CALCIUM 8.8*   GFR: Estimated Creatinine Clearance: 32.2 mL/min (A) (by C-G formula based on SCr of 1.87 mg/dL (H)). Liver Function Tests: Recent Labs  Lab 10/23/23 2107  AST 129*  ALT 48*  ALKPHOS 61  BILITOT 1.3*  PROT 6.2*  ALBUMIN  2.2*   No results for input(s): "LIPASE", "AMYLASE" in the last 168 hours. No results for input(s): "AMMONIA" in the last 168 hours. Coagulation Profile: Recent Labs  Lab 10/23/23 2107  INR 1.2   Cardiac Enzymes: Recent Labs  Lab 10/23/23 2107  CKTOTAL 3,076*   BNP (last 3 results) No results for input(s): "BNP" in the last 8760 hours. HbA1C: No results for input(s): "HGBA1C" in the last 72 hours. CBG: Recent Labs  Lab 10/23/23 2105  GLUCAP 138*   Lipid Profile: No results for input(s): "CHOL", "HDL", "LDLCALC", "TRIG", "CHOLHDL", "LDLDIRECT" in the last 72 hours. Thyroid Function Tests: No results for input(s): "TSH", "T4TOTAL", "FREET4", "T3FREE", "THYROIDAB" in the last 72 hours. Anemia Panel: No results for input(s): "VITAMINB12", "FOLATE", "FERRITIN", "TIBC", "IRON", "RETICCTPCT" in the last 72 hours. Urine analysis:    Component Value Date/Time   COLORURINE AMBER (A) 10/23/2023 2349   APPEARANCEUR CLOUDY  (A) 10/23/2023 2349   LABSPEC 1.025 10/23/2023 2349   PHURINE 5.0 10/23/2023 2349   GLUCOSEU NEGATIVE 10/23/2023 2349   HGBUR SMALL (A) 10/23/2023 2349   BILIRUBINUR NEGATIVE 10/23/2023 2349   KETONESUR 5 (A) 10/23/2023 2349   PROTEINUR 30 (A) 10/23/2023 2349   UROBILINOGEN 0.2 01/08/2014 1910   NITRITE NEGATIVE 10/23/2023 2349   LEUKOCYTESUR MODERATE (A) 10/23/2023 2349    Radiological Exams on Admission: I have personally reviewed images CT CHEST ABDOMEN PELVIS WO CONTRAST Result Date: 10/24/2023 CLINICAL DATA:  Sepsis, head and neck trauma EXAM: CT CHEST, ABDOMEN AND PELVIS WITHOUT CONTRAST TECHNIQUE: Multidetector CT imaging of the chest, abdomen and pelvis was performed following the standard protocol without IV contrast. RADIATION DOSE REDUCTION: This exam was performed according to the departmental dose-optimization program which includes automated exposure control, adjustment of the mA and/or kV according to patient size and/or use of iterative reconstruction technique. COMPARISON:  Same day chest radiograph and CT chest 01/10/2008 FINDINGS: CT CHEST FINDINGS Cardiovascular: Normal heart size. No pericardial effusion. Mitral annular calcification. Normal caliber thoracic aorta. Aortic and coronary artery calcification. Mediastinum/Nodes: No mediastinal hematoma. Small hiatal hernia. Trachea is patent. No thoracic adenopathy. Lungs/Pleura: Respiratory motion obscures detail. Geographic ground-glass opacities and interlobular septal thickening in the upper lobes. Small right and trace left pleural effusions. No pneumothorax. Musculoskeletal: No acute fracture. CT ABDOMEN PELVIS FINDINGS Hepatobiliary: Distended gallbladder with large gallstones in the gallbladder neck. No biliary dilation. No definite gallbladder wall thickening or pericholecystic fluid noting limitations of motion. Unremarkable noncontrast appearance of the liver. Pancreas: Unremarkable. Spleen: Unremarkable. Adrenals/Urinary  Tract: Normal adrenal glands. No urinary calculi or hydronephrosis. Unremarkable bladder. Stomach/Bowel: Normal caliber large and small bowel. No bowel wall thickening. Stomach is within normal limits. Vascular/Lymphatic: Aortic atherosclerotic calcification. No lymphadenopathy. Reproductive: Uterus and bilateral adnexa are unremarkable. Other: No free intraperitoneal fluid or air. Musculoskeletal: IM rod and screw fixation about the right femur with lucency about the femoral stem component compatible with loosening. No acute fracture. Advanced lumbar spondylosis. IMPRESSION: 1. Geographic ground-glass opacities and interlobular septal thickening in the upper lobes. Distribution favors atypical infection. Edema not excluded. Small right and trace left pleural effusions. 2. Distended gallbladder with large gallstones in the  gallbladder neck. No definite gallbladder wall thickening or pericholecystic fluid noting limitations of motion. If there is concern for acute cholecystitis, right upper quadrant ultrasound is recommended. 3. Loosening about the right femur hardware. 4. Aortic Atherosclerosis (ICD10-I70.0). Electronically Signed   By: Rozell Cornet M.D.   On: 10/24/2023 00:23   CT Head Wo Contrast Result Date: 10/23/2023 CLINICAL DATA:  Code sepsis. Head trauma, moderate to severe. Neck trauma. EXAM: CT HEAD WITHOUT CONTRAST CT CERVICAL SPINE WITHOUT CONTRAST TECHNIQUE: Multidetector CT imaging of the head and cervical spine was performed following the standard protocol without intravenous contrast. Multiplanar CT image reconstructions of the cervical spine were also generated. RADIATION DOSE REDUCTION: This exam was performed according to the departmental dose-optimization program which includes automated exposure control, adjustment of the mA and/or kV according to patient size and/or use of iterative reconstruction technique. COMPARISON:  CT cervical spine 10/04/2008 FINDINGS: CT HEAD FINDINGS Brain: No  intracranial hemorrhage, mass effect, or evidence of acute infarct. No hydrocephalus. No extra-axial fluid collection. Age related cerebral atrophy and chronic small vessel ischemic disease. 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 Vascular: No hyperdense vessel. Intracranial arterial calcification. Skull: No fracture or focal lesion. Sinuses/Orbits: No acute finding. Other: None. CT CERVICAL SPINE FINDINGS Alignment: No evidence of traumatic malalignment. Chronic anterolisthesis of C3 and C4. Skull base and vertebrae: No acute fracture. No primary bone lesion or focal pathologic process. Soft tissues and spinal canal: No prevertebral fluid or swelling. No visible canal hematoma. Disc levels: Multilevel spondylosis, disc space height loss, and degenerative endplate changes greatest at C4-C5 and C5-C6 where it is moderate. No severe spinal canal narrowing. Upper chest: Reported separately. Other: Carotid calcification. IMPRESSION: 1. No acute intracranial abnormality. 2. No acute fracture in the cervical spine. Electronically Signed   By: Rozell Cornet M.D.   On: 10/23/2023 22:45   CT Cervical Spine Wo Contrast Result Date: 10/23/2023 CLINICAL DATA:  Code sepsis. Head trauma, moderate to severe. Neck trauma. EXAM: CT HEAD WITHOUT CONTRAST CT CERVICAL SPINE WITHOUT CONTRAST TECHNIQUE: Multidetector CT imaging of the head and cervical spine was performed following the standard protocol without intravenous contrast. Multiplanar CT image reconstructions of the cervical spine were also generated. RADIATION DOSE REDUCTION: This exam was performed according to the departmental dose-optimization program which includes automated exposure control, adjustment of the mA and/or kV according to patient size and/or use of iterative reconstruction technique. COMPARISON:  CT cervical spine 10/04/2008 FINDINGS: CT HEAD FINDINGS Brain: No intracranial hemorrhage, mass effect, or evidence of acute infarct. No  hydrocephalus. No extra-axial fluid collection. Age related cerebral atrophy and chronic small vessel ischemic disease. 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 Vascular: No hyperdense vessel. Intracranial arterial calcification. Skull: No fracture or focal lesion. Sinuses/Orbits: No acute finding. Other: None. CT CERVICAL SPINE FINDINGS Alignment: No evidence of traumatic malalignment. Chronic anterolisthesis of C3 and C4. Skull base and vertebrae: No acute fracture. No primary bone lesion or focal pathologic process. Soft tissues and spinal canal: No prevertebral fluid or swelling. No visible canal hematoma. Disc levels: Multilevel spondylosis, disc space height loss, and degenerative endplate changes greatest at C4-C5 and C5-C6 where it is moderate. No severe spinal canal narrowing. Upper chest: Reported separately. Other: Carotid calcification. IMPRESSION: 1. No acute intracranial abnormality. 2. No acute fracture in the cervical spine. Electronically Signed   By: Rozell Cornet M.D.   On: 10/23/2023 22:45   DG Pelvis Portable Result Date: 10/23/2023 CLINICAL DATA:  Fall. EXAM:  PORTABLE PELVIS 1-2 VIEWS COMPARISON:  05/30/2022 FINDINGS: Evidence of remote posttraumatic and postsurgical changes in the right femur. Lucency around the femoral neck screws and intramedullary nail likely reflects loosening. This is stable when compared to prior study. No acute fracture, subluxation or dislocation. Mild degenerative changes in the hip joints bilaterally. IMPRESSION: No acute bony abnormality. Lucency around the right femoral hardware likely reflects loosening, unchanged since 05/30/2022. Electronically Signed   By: Janeece Mechanic M.D.   On: 10/23/2023 21:46   DG Chest Port 1 View Result Date: 10/23/2023 CLINICAL DATA:  Questionable sepsis.  Fall. EXAM: PORTABLE CHEST 1 VIEW COMPARISON:  05/30/2022 FINDINGS: Heart mediastinal contours are within normal limits. Perihilar opacities and  interstitial prominence throughout the lungs. No effusions or pneumothorax. No acute bony abnormality. IMPRESSION: Perihilar opacities and interstitial prominence throughout the lungs could reflect edema or infection. Atypical/viral infection possible. Electronically Signed   By: Janeece Mechanic M.D.   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   COPD (chronic obstructive pulmonary disease) (HCC)   Essential hypertension   Non-insulin  dependent type 2 diabetes mellitus (HCC)   Peripheral neuropathy   Fall at home, initial encounter   Cellulitis all over abdomen and thigh area   Acute cystitis   Rhabdomyolysis   History of COPD   Metabolic acidosis   Acute kidney injury superimposed on chronic kidney disease (HCC)   Hyperlipidemia   Transaminitis   Hypothyroidism   Acute hypoxic respiratory failure (HCC)   Acute metabolic encephalopathy    Assessment and Plan: Severe sepsis-community-acquired pneumonia and generalized cellulitis due to [facility] bite Acute hypoxic respiratory failure-secondary to pneumonia - Presented to ED via EMS as patient found down in the bathroom.  Unknown downtime.  Sister reported last spoken with her on Saturday 10/22/2023.  Patient found in urine and feces and Magod all over body. -Patient is hypotensive, tachycardic, tachypneic and O2 sat dropped to 84% room air. -Elevated lactic acid, leukocytosis, tachycardic and hypotensive.  Usually Myrtice of UTI.  CTA chest showed evidence of pneumonia.  Patient meets sepsis criteria - In the ED received total 3 L of LR bolus.  Blood pressure is still soft.  Giving fourth liter of LR bolus and continue LR 150 cc/h.  Giving albumin  and midodrine 10 mg. - Continue broad-spectrum antibiotic coverage with IV vancomycin  and cefepime. - Pending blood culture, urine culture, sputum culture, urine  Legionella, urine strep antigen, procalcitonin level.  Checking respiratory panel. -Continue cardiac monitoring.   Maggot infestation -Patient found to be buried in [facility] all over her body.  It has been cleaned while in the ED.   Acute kidney injury superimposed CKD stage IIIb -Elevated creatinine 1.97 and low GFR at 28.  Prerenal acute kidney injury in the setting of sepsis and volume depletion. -Continue maintenance fluid, avoid nephrotoxic agent.  Monitor urine output.  Renally adjust medication.  Acute metabolic encephalopathy-in the setting of sepsis Mechanical fall-unknown downtime -Patient found fall unknown downtime.  Sister reported last conversation on Saturday 6/7. - Patient found to be significantly septic and rhabdomyolysis.  Unknown cause of fall at this time. - CT head and CT cervical spine no acute abnormality.  CT chest showed evidence of pneumonia - Given patient has low GFR below 30 unable to obtain CTA chest with contrast.   -Checking VQ scan and D-dimer however D-dimer will be elevated  in the setting of fall and unknown downtime as well as acute metabolic encephalopathy. -Checking ammonia, TSH, vitamin B12 and RPR. - Continue cardiac monitoring. -In the setting of altered mental status keeping patient n.p.o. is high risk for aspiration.  Rhabdomyolysis -Elevated CK.  Continue maintenance fluid and will recheck CK in the morning.  Transaminitis Hyperbilirubinemia -Transaminitis and hyperbilirubinemia in the setting of dehydration and sepsis. -CT abdomen pelvis showing cholelith assess without any evidence of cholecystitis.  Continue to monitor hepatic function panel.  Checking acute hepatitis panel.  History of femoral neck fracture Chronic pain syndrome Holding pain medication in the setting of altered mental status.  Anemia of chronic disease -Stable H&H.  Essential hypertension -Hold blood pressure medication in the setting of sepsis and  hypotension.  Generalized anxiety disorder Major depressive disorder Bipolar disorder -In setting of altered mental status hold benzodiazepine.  Hypothyroidism -Continue IV levothyroxine   Non-insulin -dependent DM type II -Continue check POC blood glucose in the setting of altered mental status and NPO.[name]  Holding metformin   Peripheral neuropathy - Holding gabapentin  in the setting of altered mental status.  History of COPD  Acute hypoxic respiratory failure in setting of pneumonia -Continue  supplemental oxygen and check pulse ox. -Continue DuoNeb as needed.   DVT prophylaxis:  SQ Heparin Code Status:  Full Code by default. Diet: N.p.o. in the setting of altered mental status and high risk for aspiration. Family Communication: Unable to reach patient's sister over phone. Disposition Plan: Monitor improvement of blood pressure.  With culture results. Consults:   Admission status:   Inpatient, Step Down Unit  Severity of Illness: The appropriate patient status for this patient is INPATIENT. Inpatient status is judged to be reasonable and necessary in order to provide the required intensity of service to ensure the patient's safety. The patient's presenting symptoms, physical exam findings, and initial radiographic and laboratory data in the context of their chronic comorbidities is felt to place them at high risk for further clinical deterioration. Furthermore, it is not anticipated that the patient will be medically stable for discharge from the hospital within 2 midnights of admission.   * I certify that at the point of admission it is my clinical judgment that the patient will require inpatient hospital care spanning beyond 2 midnights from the point of admission due to high intensity of service, high risk for further deterioration and high frequency of surveillance required.[name]    Wanell Lorenzi, MD [facility]  How to contact the [facility] Attending or Consulting provider 7A  - 7P or covering provider during after hours 7P -7A, for this patient.  Check the care team in [name] and look for a) attending/consulting [facility] provider listed and b) the [facility] team listed Log into [email or web address] and use [place]'s universal password to access. If you do not have the password, please contact the hospital operator. Locate the [facility] provider you are looking for under [facility] and page to a number that you can be directly reached. If you still have difficulty reaching the provider, please page the [facility] (Director on Call) for the Hospitalists listed on amion for assistance.  10/24/2023, 2:23 AM

## 2023-10-24 NOTE — Progress Notes (Signed)
 Pharmacy Antibiotic Note  [name] [name] is a 73 y.o. female admitted on 10/23/2023 after welfare check where she was found on ground for unknown period of time.  Pharmacy has been consulted for vancomycin  dosing for sepsis.  -CXR: edema vs infection -WBC 26.7 > 22, lactic acid 3.9 > 2.6, sCr 1.87 > 1.59 (bl~1), afebrile  Plan: -Adjust vancomycin  to 1500mg  IV every 48 hours (AUC 513, Vd 0.5, IBW, sCr 1.59) -Monitor renal function -Follow up signs of clinical improvement, LOT, de-escalation of antibiotics   Height: 5\' 2"  (157.5 cm) Weight: 117.8 kg (259 lb 11.2 oz) IBW/kg (Calculated) : 50.1  Temp (24hrs), Avg:99.2 F (37.3 C), Min:98.5 F (36.9 C), Max:99.8 F (37.7 C)  Recent Labs  Lab 10/23/23 2107 10/23/23 2113 10/23/23 2321 10/24/23 0202  WBC 26.7*  --   --  22.0*  CREATININE 1.87*  --   --  1.59*  LATICACIDVEN  --  3.9* 2.4* 2.6*    Estimated Creatinine Clearance: 37.8 mL/min (A) (by C-G formula based on SCr of 1.59 mg/dL (H)).    Allergies  Allergen Reactions   Coffee Flavoring Agent (Non-Screening) Nausea And Vomiting    Per pt she is "allergic" to the smell of coffee   Prednisone Other (See Comments)    Tightness in chest   Other Other (See Comments)    Plain nystatin  ointment-makes skin get worse Patient is allergic to any petroleum based ointment     Antimicrobials this admission: Cefepime 6/8 >>  Vancomycin  6/8 >>   Microbiology results: 6/8 BCx:  6/8 UCx:   6/9 Sputum: ordered  6/9 MRSA PCR: negative  Thank you for involving pharmacy in the patient's care.   Barbra Boone, PharmD PGY1 Acute Care Pharmacy Resident  10/24/2023 7:09 AM

## 2023-10-24 NOTE — Significant Event (Signed)
 Patient's blood sugar was found to be 61.  After D50 it increased to 121.  Patient has failed swallow evaluation.  For now we will check CBGs closely without any insulin  coverage.  Get formal speech therapy evaluation.  Closely monitor CBGs.  [name] [name].

## 2023-10-24 NOTE — ED Notes (Signed)
CBG 135  

## 2023-10-25 ENCOUNTER — Inpatient Hospital Stay (HOSPITAL_COMMUNITY)

## 2023-10-25 DIAGNOSIS — R531 Weakness: Secondary | ICD-10-CM

## 2023-10-25 DIAGNOSIS — R6520 Severe sepsis without septic shock: Secondary | ICD-10-CM | POA: Diagnosis not present

## 2023-10-25 DIAGNOSIS — A419 Sepsis, unspecified organism: Secondary | ICD-10-CM | POA: Diagnosis not present

## 2023-10-25 DIAGNOSIS — L249 Irritant contact dermatitis, unspecified cause: Secondary | ICD-10-CM

## 2023-10-25 DIAGNOSIS — T796XXS Traumatic ischemia of muscle, sequela: Secondary | ICD-10-CM | POA: Diagnosis not present

## 2023-10-25 DIAGNOSIS — B958 Unspecified staphylococcus as the cause of diseases classified elsewhere: Secondary | ICD-10-CM

## 2023-10-25 DIAGNOSIS — B954 Other streptococcus as the cause of diseases classified elsewhere: Secondary | ICD-10-CM

## 2023-10-25 DIAGNOSIS — Z66 Do not resuscitate: Secondary | ICD-10-CM

## 2023-10-25 DIAGNOSIS — Z515 Encounter for palliative care: Secondary | ICD-10-CM

## 2023-10-25 DIAGNOSIS — Z8709 Personal history of other diseases of the respiratory system: Secondary | ICD-10-CM | POA: Diagnosis not present

## 2023-10-25 DIAGNOSIS — N179 Acute kidney failure, unspecified: Secondary | ICD-10-CM | POA: Diagnosis not present

## 2023-10-25 DIAGNOSIS — R7881 Bacteremia: Secondary | ICD-10-CM | POA: Diagnosis not present

## 2023-10-25 DIAGNOSIS — B372 Candidiasis of skin and nail: Secondary | ICD-10-CM

## 2023-10-25 LAB — T4, FREE: Free T4: 0.84 ng/dL (ref 0.61–1.12)

## 2023-10-25 LAB — COMPREHENSIVE METABOLIC PANEL WITH GFR
ALT: 41 U/L (ref 0–44)
AST: 79 U/L — ABNORMAL HIGH (ref 15–41)
Albumin: 2.4 g/dL — ABNORMAL LOW (ref 3.5–5.0)
Alkaline Phosphatase: 37 U/L — ABNORMAL LOW (ref 38–126)
Anion gap: 12 (ref 5–15)
BUN: 29 mg/dL — ABNORMAL HIGH (ref 8–23)
CO2: 19 mmol/L — ABNORMAL LOW (ref 22–32)
Calcium: 8.5 mg/dL — ABNORMAL LOW (ref 8.9–10.3)
Chloride: 114 mmol/L — ABNORMAL HIGH (ref 98–111)
Creatinine, Ser: 1.1 mg/dL — ABNORMAL HIGH (ref 0.44–1.00)
GFR, Estimated: 53 mL/min — ABNORMAL LOW (ref >60)
Glucose, Bld: 93 mg/dL (ref 70–99)
Potassium: 3.8 mmol/L (ref 3.5–5.1)
Sodium: 145 mmol/L (ref 135–145)
Total Bilirubin: 0.7 mg/dL (ref 0.0–1.2)
Total Protein: 5.2 g/dL — ABNORMAL LOW (ref 6.5–8.1)

## 2023-10-25 LAB — CBC WITH DIFFERENTIAL/PLATELET
Abs Immature Granulocytes: 0.15 10*3/uL — ABNORMAL HIGH (ref 0.00–0.07)
Basophils Absolute: 0 10*3/uL (ref 0.0–0.1)
Basophils Relative: 0 %
Eosinophils Absolute: 0.9 10*3/uL — ABNORMAL HIGH (ref 0.0–0.5)
Eosinophils Relative: 7 %
HCT: 32.5 % — ABNORMAL LOW (ref 36.0–46.0)
Hemoglobin: 9.8 g/dL — ABNORMAL LOW (ref 12.0–15.0)
Immature Granulocytes: 1 %
Lymphocytes Relative: 10 %
Lymphs Abs: 1.5 10*3/uL (ref 0.7–4.0)
MCH: 27.3 pg (ref 26.0–34.0)
MCHC: 30.2 g/dL (ref 30.0–36.0)
MCV: 90.5 fL (ref 80.0–100.0)
Monocytes Absolute: 0.7 10*3/uL (ref 0.1–1.0)
Monocytes Relative: 5 %
Neutro Abs: 11.1 10*3/uL — ABNORMAL HIGH (ref 1.7–7.7)
Neutrophils Relative %: 77 %
Platelets: 295 10*3/uL (ref 150–400)
RBC: 3.59 MIL/uL — ABNORMAL LOW (ref 3.87–5.11)
RDW: 13.9 % (ref 11.5–15.5)
WBC: 14.4 10*3/uL — ABNORMAL HIGH (ref 4.0–10.5)
nRBC: 0 % (ref 0.0–0.2)

## 2023-10-25 LAB — BRAIN NATRIURETIC PEPTIDE: B Natriuretic Peptide: 558.3 pg/mL — ABNORMAL HIGH (ref 0.0–100.0)

## 2023-10-25 LAB — TSH: TSH: 4.504 u[IU]/mL — ABNORMAL HIGH (ref 0.350–4.500)

## 2023-10-25 LAB — PROCALCITONIN: Procalcitonin: 0.63 ng/mL

## 2023-10-25 LAB — GLUCOSE, CAPILLARY
Glucose-Capillary: 135 mg/dL — ABNORMAL HIGH (ref 70–99)
Glucose-Capillary: 102 mg/dL — ABNORMAL HIGH (ref 70–99)
Glucose-Capillary: 90 mg/dL (ref 70–99)
Glucose-Capillary: 73 mg/dL (ref 70–99)
Glucose-Capillary: 90 mg/dL (ref 70–99)
Glucose-Capillary: 105 mg/dL — ABNORMAL HIGH (ref 70–99)
Glucose-Capillary: 102 mg/dL — ABNORMAL HIGH (ref 70–99)

## 2023-10-25 LAB — AMMONIA: Ammonia: 22 umol/L (ref 9–35)

## 2023-10-25 LAB — PHOSPHORUS: Phosphorus: 2.4 mg/dL — ABNORMAL LOW (ref 2.5–4.6)

## 2023-10-25 LAB — CORTISOL: Cortisol, Plasma: 20.8 ug/dL

## 2023-10-25 LAB — CK: Total CK: 1409 U/L — ABNORMAL HIGH (ref 38–234)

## 2023-10-25 LAB — MAGNESIUM: Magnesium: 1.7 mg/dL (ref 1.7–2.4)

## 2023-10-25 LAB — C-REACTIVE PROTEIN: CRP: 15.5 mg/dL — ABNORMAL HIGH (ref <1.0)

## 2023-10-25 MED ORDER — POTASSIUM PHOSPHATES 15 MMOLE/5ML IV SOLN
30.0000 mmol | Freq: Once | INTRAVENOUS | Status: AC
  Administered 2023-10-25: 30 mmol via INTRAVENOUS
  Filled 2023-10-25: qty 10

## 2023-10-25 MED ORDER — KETOROLAC TROMETHAMINE 30 MG/ML IJ SOLN
15.0000 mg | Freq: Three times a day (TID) | INTRAMUSCULAR | Status: AC | PRN
  Administered 2023-10-25: 15 mg via INTRAVENOUS
  Filled 2023-10-25: qty 1

## 2023-10-25 MED ORDER — SODIUM CHLORIDE 0.9 % IV SOLN
100.0000 mg | Freq: Two times a day (BID) | INTRAVENOUS | Status: DC
  Administered 2023-10-25 (×2): 100 mg via INTRAVENOUS
  Filled 2023-10-25 (×3): qty 100

## 2023-10-25 MED ORDER — DEXTROSE IN LACTATED RINGERS 5 % IV SOLN: INTRAVENOUS | Status: DC

## 2023-10-25 MED ORDER — K PHOS MONO-SOD PHOS DI & MONO 155-852-130 MG PO TABS
500.0000 mg | ORAL_TABLET | Freq: Two times a day (BID) | ORAL | Status: AC
  Administered 2023-10-25 – 2023-10-26 (×2): 500 mg via ORAL
  Filled 2023-10-25 (×2): qty 2

## 2023-10-25 MED ORDER — INSULIN ASPART 100 UNIT/ML IJ SOLN
0.0000 [IU] | Freq: Three times a day (TID) | INTRAMUSCULAR | Status: DC
  Administered 2023-10-31: 2 [IU] via SUBCUTANEOUS

## 2023-10-25 MED ORDER — MIDODRINE HCL 5 MG PO TABS: 10.0000 mg | ORAL_TABLET | Freq: Three times a day (TID) | ORAL | Status: DC

## 2023-10-25 MED ORDER — MIDODRINE HCL 5 MG PO TABS: 5.0000 mg | ORAL_TABLET | Freq: Three times a day (TID) | ORAL | Status: DC

## 2023-10-25 MED ORDER — LACTATED RINGERS IV SOLN: INTRAVENOUS | Status: AC

## 2023-10-25 MED ORDER — PANTOPRAZOLE SODIUM 40 MG IV SOLR
40.0000 mg | INTRAVENOUS | Status: DC
  Administered 2023-10-25 – 2023-10-30 (×6): 40 mg via INTRAVENOUS
  Filled 2023-10-25 (×6): qty 10

## 2023-10-25 MED ORDER — FLUCONAZOLE 150 MG PO TABS
150.0000 mg | ORAL_TABLET | Freq: Every day | ORAL | Status: DC
  Administered 2023-10-25 – 2023-10-31 (×7): 150 mg via ORAL
  Filled 2023-10-25 (×7): qty 1

## 2023-10-25 NOTE — Consult Note (Signed)
 WOC Nurse consult requested for multiple wounds.   This was already performed yesterday; please refer to consult notes on 6/9 for assessment and measurements, and topical treatment orders have been provided for bedside nurses to perform. Please re-consult if further assistance is needed.  Thank-you,  Wiliam Harder MSN, RN, CWOCN, [place], CNS [phone]

## 2023-10-25 NOTE — Progress Notes (Signed)
 Modified Barium Swallow Study  Patient Details  Name: [name] [name] MRN: [id number] Date of Birth: 01/16/1951  Today's Date: 10/25/2023  Modified Barium Swallow completed.  Full report located under Chart Review in the Imaging Section.  History of Present Illness 73 yo female adm to [facility] with AMS, sepsis - Concern for cellulitis and pna.  She was found covered in feces and maggots.  Pt with PMH + for hiatal hernia, small distal ring, tertiary contractions seen on 2017 UGI study. Pt was in prep for bariatric surgery.  PMH also + for potential bipolar, hoarding, picking at sores and lives alone in a mobile home.  Swallow eval ordered due to pt not passing swallow screen.  Pt admits to issues with dysphagia causing her to cough and sensing food retention in distal pharynx causing her to propel into mouth and reswallow.   Clinical Impression Pt demonstrates a moderate oropharyngeal dysphagia with a DIGEST score of 2. Patient initiates swallow when liquids have reached the pyriform sinuses. If taking a single sip, swallow function is otherwise adequate. But if the patient takes a large or sequential sip, the bolus hesitates above the PES and the second propulsive effort and hyoid burst pushes liquid into the airway and up to the nasopharynx. Pt gags, material moves to mouth and pt reswallows. When taking a pill there was significant aspiration of a larger thin bolus eliciting hard coughing. When cued to take one sip at a time from a straw or cup pt protected airway consistently. Observed that pt typically has a posterior tilt to head while swallowing. A more neutral or anterior position will also help. Esophageal sweep not possible due to body habitus. Pt agreeable to a trial of therapy to improve awareness of decreased rate but also wants to continue an unrestricted diet. Will resume regular/thin and f/u.  DIGEST Swallow Severity Rating*  Safety: 2  Efficiency:1  Overall Pharyngeal Swallow Severity:  2 1: mild; 2: moderate; 3: severe; 4: profound  *The Dynamic Imaging Grade of Swallowing Toxicity is standardized for the head and neck cancer population, however, demonstrates promising clinical applications across populations to standardize the clinical rating of pharyngeal swallow safety and severity.  Factors that may increase risk of adverse event in presence of aspiration Roderick Civatte & Jessy Morocco 2021): Inadequate oral hygiene;Limited mobility  Swallow Evaluation Recommendations Liquid Administration via: Cup;Straw Medication Administration: Whole meds with puree Supervision: Patient able to self-feed Swallowing strategies  : Slow rate;Small bites/sips;Minimize environmental distractions Postural changes: Position pt fully upright for meals Oral care recommendations: Oral care BID (2x/day)   Noble Bateman, MA CCC-SLP  Acute Rehabilitation Services Secure Chat Preferred Office [phone]    Valeri Gate 10/25/2023,3:33 PM

## 2023-10-25 NOTE — Plan of Care (Signed)

## 2023-10-25 NOTE — Progress Notes (Addendum)
 PROGRESS NOTE                                                                                                                                                                                                             Patient Demographics:    [name] [name], is a 73 y.o. female, DOB - 07-Feb-1951, EAV:[id number]  Outpatient Primary MD for the patient is Lory Rough., PA-C    LOS - 1  Admit date - 10/23/2023    Chief Complaint  Patient presents with   Code Sepsis       Brief Narrative (HPI from H&P)    73 y.o. female with medical history significant of COPD, essential hypertension, hyperlipidemia, DM type II, peripheral neuropathy, chronic right femoral fracture, right proximal tibial fracture status post ORIF, chronic constipation, chronic depressive disorder, bipolar disorder, hypothyroidism, hoarding behavior, vitamin D  deficiency, and morbid obesity presented emergency department via EMS as being called out for a welfare check.  EMS found the patient on the ground in her own urine and feces with maggots crawling on her seen by EMS.  Unknown downtime and fall.  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   Subjective:    Desirie Messenger today has, No headache, No chest pain, No abdominal pain - No Nausea, No new weakness tingling or numbness, no shortness of breath.  Some generalized bodyaches and pains all over.   Assessment  & Plan :    Severe sepsis-community-acquired pneumonia and generalized cellulitis infested with Magots Acute hypoxic respiratory failure-secondary to pneumonia - Presented to ED via EMS as patient found down in the bathroom for unknown period of time likely 1 to 2 days, covered in urine and feces along with maggots all over her body.  Initial diagnosis of generalized bruises and multiple skin breakdown areas, also suspicion for pneumonia.  She is negative for MRSA PCR, follow urine and sputum  cultures, currently on IV fluids, empiric IV antibiotics which have been titrated on 10/25/2022.  Currently appears nontoxic, sepsis pathophysiology has resolved. Check TTE, W care, Continue to monitor.     Maggot infestation -Patient found to be buried in [facility] all over her body.  It has been cleaned while in the ED.     Acute kidney injury superimposed CKD stage IIIb - Due to dehydration and mild  rhabdomyolysis.  Hydrate.  Improving   Acute metabolic encephalopathy-due to dehydration and sepsis. - CT head and C-spine nonacute, no headache or focal deficits, mentation improving, continue supportive care, minimize narcotics and benzodiazepines, stable TSH, B12, RPR and ammonia levels.   Rhabdomyolysis - Hydrate and monitor.   Mild transaminitis Hyperbilirubinemia -Transaminitis and hyperbilirubinemia in the setting of dehydration and rhabdomyolysis.  No right upper quadrant tenderness, negative acute hepatitis panel trend is improving.  Monitor.     History of femoral neck fracture Chronic pain syndrome Supportive care, CT noted, discussed with orthopedics PA Umberto Ganong, CT finding of loose hardware is chronic.  Nothing to be done.   Hypophosphatemia - replaced  Anemia of chronic disease -Stable H&H.   Essential hypertension -Hold blood pressure medication in the setting of sepsis and hypotension.   Generalized anxiety disorder Major depressive disorder Bipolar disorder -In setting of altered mental status hold benzodiazepine.   Hypothyroidism -Continue  levothyroxine    Peripheral neuropathy - Holding gabapentin  in the setting of altered mental status.   History of COPD  Acute hypoxic respiratory failure in setting of pneumonia -Continue  supplemental oxygen and check pulse ox. -Continue DuoNeb as needed.  Non-insulin -dependent DM type II currently has hypoglycemia likely due to prolonged starvation and downtime -Continue check POC blood glucose in the setting of  altered mental status and NPO.[name]  Holding metformin , TSH stable, check random cortisol, D5 drip for now  Lab Results  Component Value Date   HGBA1C 6.4 (H) 01/09/2014   CBG (last 3)  Recent Labs    10/25/23 0845 10/25/23 1303 10/25/23 1608  GLUCAP 73 90 105*         Condition - Extremely Guarded  Family Communication  : None present  Code Status : DNR  Consults  : None  PUD Prophylaxis : PPI   Procedures  :     CT chest abdomen and pelvis- 1. Geographic ground-glass opacities and interlobular septal thickening in the upper lobes. Distribution favors atypical infection. Edema not excluded. Small right and trace left pleural effusions. 2. Distended gallbladder with large gallstones in the gallbladder neck. No definite gallbladder wall thickening or pericholecystic fluid noting limitations of motion. If there is concern for acute cholecystitis, right upper quadrant ultrasound is recommended. 3. Loosening about the right femur hardware. 4. Aortic Atherosclerosis   CT head C-spine.  Nonacute.      Disposition Plan  :    Status is: Inpatient   DVT Prophylaxis  :    heparin injection 5,000 Units Start: 10/24/23 0600 SCDs Start: 10/24/23 0056 Place TED hose Start: 10/24/23 0056     Lab Results  Component Value Date   PLT 295 10/25/2023    Diet :  Diet Order             Diet regular Room service appropriate? Yes with Assist; Fluid consistency: Thin  Diet effective now                    Inpatient Medications  Scheduled Meds:  fluconazole  150 mg Oral Daily   heparin  5,000 Units Subcutaneous Q8H   [START ON 10/26/2023] insulin  aspart  0-6 Units Subcutaneous TID WC   leptospermum manuka honey  1 Application Topical Daily   levothyroxine   25 mcg Oral Q0600   midodrine  10 mg Oral TID WC   pantoprazole  (PROTONIX ) IV  40 mg Intravenous Q24H   silver sulfADIAZINE   Topical Daily   sodium chloride  flush  3 mL Intravenous Q12H   Continuous Infusions:   dextrose  5% lactated ringers  Stopped (10/25/23 1627)   doxycycline (VIBRAMYCIN) IV 100 mg (10/25/23 1322)   lactated ringers  100 mL/hr at 10/24/23 2141   potassium PHOSPHATE IVPB (in mmol) 30 mmol (10/25/23 1104)   PRN Meds:.clorazepate, dextrose , ipratropium-albuterol , ketorolac, ondansetron  **OR** ondansetron  (ZOFRAN ) IV, senna-docusate    Objective:   Vitals:   10/25/23 0304 10/25/23 0846 10/25/23 1306 10/25/23 1606  BP: (!) 143/73 (!) 162/76 (!) 150/100 (!) 150/77  Pulse: 88 89 88 86  Resp: 17 15 (!) 26 18  Temp: 98.2 F (36.8 C) (!) 97.5 F (36.4 C) 98 F (36.7 C) 98.5 F (36.9 C)  TempSrc: Oral Oral Oral Oral  SpO2: 97% 94% 95%   Weight:      Height:        Wt Readings from Last 3 Encounters:  10/23/23 117.8 kg  07/09/22 121.6 kg  05/30/22 (!) 136.1 kg    No intake or output data in the 24 hours ending 10/25/23 1627   Physical Exam  Awake Alert, No new F.N deficits, Normal affect [place].AT,PERRAL Supple Neck, No JVD,   Symmetrical Chest wall movement, Good air movement bilaterally, CTAB RRR,No Gallops,Rubs or new Murmurs,  +ve B.Sounds, Abd Soft, No tenderness,   Multiple bruises and skin tears all over her body    RN pressure injury documentation: Pressure Injury 10/24/23 Face Left Deep Tissue Pressure Injury - Purple or maroon localized area of discolored intact skin or blood-filled blister due to damage of underlying soft tissue from pressure and/or shear. (Active)  10/24/23   Location: Face  Location Orientation: Left  Staging: Deep Tissue Pressure Injury - Purple or maroon localized area of discolored intact skin or blood-filled blister due to damage of underlying soft tissue from pressure and/or shear.  Wound Description (Comments):   Present on Admission: Yes     Pressure Injury 10/24/23 Breast Left Unstageable - Full thickness tissue loss in which the base of the injury is covered by slough (yellow, tan, gray, green or brown) and/or eschar (tan, brown  or black) in the wound bed. (Active)  10/24/23   Location: Breast  Location Orientation: Left  Staging: Unstageable - Full thickness tissue loss in which the base of the injury is covered by slough (yellow, tan, gray, green or brown) and/or eschar (tan, brown or black) in the wound bed.  Wound Description (Comments):   Present on Admission: Yes     Pressure Injury 10/24/23 Abdomen Lower;Left Unstageable - Full thickness tissue loss in which the base of the injury is covered by slough (yellow, tan, gray, green or brown) and/or eschar (tan, brown or black) in the wound bed. (Active)  10/24/23   Location: Abdomen  Location Orientation: Lower;Left  Staging: Unstageable - Full thickness tissue loss in which the base of the injury is covered by slough (yellow, tan, gray, green or brown) and/or eschar (tan, brown or black) in the wound bed.  Wound Description (Comments):   Present on Admission: Yes     Pressure Injury 10/24/23 Knee Right;Left Deep Tissue Pressure Injury - Purple or maroon localized area of discolored intact skin or blood-filled blister due to damage of underlying soft tissue from pressure and/or shear. (Active)  10/24/23   Location: Knee  Location Orientation: Right;Left  Staging: Deep Tissue Pressure Injury - Purple or maroon localized area of discolored intact skin or blood-filled blister due to damage of underlying soft tissue from pressure and/or shear.  Wound Description (  Comments):   Present on Admission: Yes     Pressure Injury 10/24/23 Toe (Comment  which one) Right;Left;Anterior Deep Tissue Pressure Injury - Purple or maroon localized area of discolored intact skin or blood-filled blister due to damage of underlying soft tissue from pressure and/or shear. (Active)  10/24/23   Location: Toe (Comment  which one)  Location Orientation: Right;Left;Anterior  Staging: Deep Tissue Pressure Injury - Purple or maroon localized area of discolored intact skin or blood-filled  blister due to damage of underlying soft tissue from pressure and/or shear.  Wound Description (Comments):   Present on Admission: Yes     Pressure Injury 10/24/23 Hand Left Deep Tissue Pressure Injury - Purple or maroon localized area of discolored intact skin or blood-filled blister due to damage of underlying soft tissue from pressure and/or shear. (Active)  10/24/23   Location: Hand  Location Orientation: Left  Staging: Deep Tissue Pressure Injury - Purple or maroon localized area of discolored intact skin or blood-filled blister due to damage of underlying soft tissue from pressure and/or shear.  Wound Description (Comments):   Present on Admission: Yes      Data Review:    Recent Labs  Lab 10/23/23 2107 10/24/23 0202 10/25/23 0524  WBC 26.7* 22.0* 14.4*  HGB 12.8 11.2* 9.8*  HCT 40.9 36.5 32.5*  PLT 471* 388 295  MCV 88.3 90.8 90.5  MCH 27.6 27.9 27.3  MCHC 31.3 30.7 30.2  RDW 13.6 13.7 13.9  LYMPHSABS 1.2  --  1.5  MONOABS 1.9*  --  0.7  EOSABS 0.0  --  0.9*  BASOSABS 0.1  --  0.0    Recent Labs  Lab 10/23/23 2107 10/23/23 2113 10/23/23 2321 10/24/23 0202 10/24/23 0700 10/24/23 0954 10/24/23 1948 10/25/23 0524 10/25/23 0536  NA 141  --   --  143  --   --   --  145  --   K 4.2  --   --  4.0  --   --   --  3.8  --   CL 109  --   --  112*  --   --   --  114*  --   CO2 17*  --   --  19*  --   --   --  19*  --   ANIONGAP 15  --   --  12  --   --   --  12  --   GLUCOSE 144*  --   --  139*  --   --   --  93  --   BUN 39*  --   --  37*  --   --   --  29*  --   CREATININE 1.87*  --   --  1.59*  --   --   --  1.10*  --   AST 129*  --   --  104*  --   --   --  79*  --   ALT 48*  --   --  44  --   --   --  41  --   ALKPHOS 61  --   --  47  --   --   --  37*  --   BILITOT 1.3*  --   --  1.0  --   --   --  0.7  --   ALBUMIN  2.2*  --   --  1.9*  --   --   --  2.4*  --   CRP  --   --   --   --   --   --   --   --  15.5*  DDIMER 8.69*  --   --   --   --   --   --    --   --   PROCALCITON  --   --   --  1.11  --   --   --   --  0.63  LATICACIDVEN  --    < > 2.4* 2.6* >9.0* 4.8* 2.1*  --   --   INR 1.2  --   --   --   --   --   --   --   --   TSH  --   --   --  1.671  --   --   --   --  4.504*  AMMONIA  --   --   --  16  --   --   --   --  22  BNP  --   --   --   --   --   --   --   --  558.3*  MG  --   --   --   --   --   --   --   --  1.7  PHOS  --   --   --   --   --   --   --   --  2.4*  CALCIUM 8.8*  --   --  8.4*  --   --   --  8.5*  --    < > = values in this interval not displayed.      Recent Labs  Lab 10/23/23 2107 10/23/23 2113 10/23/23 2321 10/24/23 0202 10/24/23 0700 10/24/23 0954 10/24/23 1948 10/25/23 0524 10/25/23 0536  CRP  --   --   --   --   --   --   --   --  15.5*  DDIMER 8.69*  --   --   --   --   --   --   --   --   PROCALCITON  --   --   --  1.11  --   --   --   --  0.63  LATICACIDVEN  --    < > 2.4* 2.6* >9.0* 4.8* 2.1*  --   --   INR 1.2  --   --   --   --   --   --   --   --   TSH  --   --   --  1.671  --   --   --   --  4.504*  AMMONIA  --   --   --  16  --   --   --   --  22  BNP  --   --   --   --   --   --   --   --  558.3*  MG  --   --   --   --   --   --   --   --  1.7  CALCIUM 8.8*  --   --  8.4*  --   --   --  8.5*  --    < > = values in this interval not displayed.    --------------------------------------------------------------------------------------------------------------- No results found for: "CHOL", "HDL", "LDLCALC", "LDLDIRECT", "TRIG", "  CHOLHDL"  Lab Results  Component Value Date   HGBA1C 6.4 (H) 01/09/2014   Recent Labs    10/25/23 0536  TSH 4.504*  FREET4 0.84   Recent Labs    10/24/23 0202  VITAMINB12 1,840*   ------------------------------------------------------------------------------------------------------------------ Cardiac Enzymes No results for input(s): "CKMB", "TROPONINI", "MYOGLOBIN" in the last 168 hours.  Invalid input(s): "CK"  Micro Results Recent  Results (from the past 240 hours)  Blood Culture (routine x 2)     Status: Abnormal (Preliminary result)   Collection Time: 10/23/23  9:06 PM   Specimen: BLOOD  Result Value Ref Range Status   Specimen Description BLOOD BLOOD LEFT WRIST  Final   Special Requests   Final    BOTTLES DRAWN AEROBIC AND ANAEROBIC Blood Culture adequate volume   Culture  Setup Time   Final    GRAM POSITIVE COCCI IN BOTH AEROBIC AND ANAEROBIC BOTTLES CRITICAL VALUE NOTED.  VALUE IS CONSISTENT WITH PREVIOUSLY REPORTED AND CALLED VALUE. Performed at [facility], [address] [place], [place] [address]    Culture STAPHYLOCOCCUS EPIDERMIDIS (A)  Final   Report Status PENDING  Incomplete  Blood Culture (routine x 2)     Status: Abnormal (Preliminary result)   Collection Time: 10/23/23  9:07 PM   Specimen: BLOOD RIGHT WRIST  Result Value Ref Range Status   Specimen Description BLOOD RIGHT WRIST  Final   Special Requests   Final    BOTTLES DRAWN AEROBIC AND ANAEROBIC Blood Culture adequate volume   Culture  Setup Time   Final    GRAM POSITIVE COCCI IN CLUSTERS GRAM POSITIVE COCCI IN CHAINS IN BOTH AEROBIC AND ANAEROBIC BOTTLES CRITICAL RESULT CALLED TO, READ BACK BY AND VERIFIED WITH: PHARMD JEREMY FRENS ON 10/24/23 @ 1502 BY DRT    Culture (A)  Final    STAPHYLOCOCCUS EPIDERMIDIS STREPTOCOCCUS MITIS/ORALIS SUSCEPTIBILITIES TO FOLLOW Performed at [facility], [address], [place], [place] [address]    Report Status PENDING  Incomplete  Blood Culture ID Panel (Reflexed)     Status: Abnormal   Collection Time: 10/23/23  9:07 PM  Result Value Ref Range Status   Enterococcus faecalis NOT DETECTED NOT DETECTED Final   Enterococcus Faecium NOT DETECTED NOT DETECTED Final   Listeria monocytogenes NOT DETECTED NOT DETECTED Final   Staphylococcus species DETECTED (A) NOT DETECTED Final    Comment: CRITICAL RESULT CALLED TO, READ BACK BY AND VERIFIED WITH: PHARMD JEREMY FRENS ON 10/24/23 @ 1502 BY  DRT    Staphylococcus aureus (BCID) NOT DETECTED NOT DETECTED Final   Staphylococcus epidermidis DETECTED (A) NOT DETECTED Final    Comment: Methicillin (oxacillin) resistant coagulase negative staphylococcus. Possible blood culture contaminant (unless isolated from more than one blood culture draw or clinical Wilinski suggests pathogenicity). No antibiotic treatment is indicated for blood  culture contaminants. CRITICAL RESULT CALLED TO, READ BACK BY AND VERIFIED WITH: PHARMD JEREMY FRENS ON 10/24/23 @ 1502 BY DRT    Staphylococcus lugdunensis NOT DETECTED NOT DETECTED Final   Streptococcus species DETECTED (A) NOT DETECTED Final    Comment: Not Enterococcus species, Streptococcus agalactiae, Streptococcus pyogenes, or Streptococcus pneumoniae. CRITICAL RESULT CALLED TO, READ BACK BY AND VERIFIED WITH: PHARMD JEREMY FRENS ON 10/24/23 @ 1502 BY DRT    Streptococcus agalactiae NOT DETECTED NOT DETECTED Final   Streptococcus pneumoniae NOT DETECTED NOT DETECTED Final   Streptococcus pyogenes NOT DETECTED NOT DETECTED Final   A.calcoaceticus-baumannii NOT DETECTED NOT DETECTED Final   Bacteroides fragilis NOT  DETECTED NOT DETECTED Final   Enterobacterales NOT DETECTED NOT DETECTED Final   Enterobacter cloacae complex NOT DETECTED NOT DETECTED Final   Escherichia coli NOT DETECTED NOT DETECTED Final   Klebsiella aerogenes NOT DETECTED NOT DETECTED Final   Klebsiella oxytoca NOT DETECTED NOT DETECTED Final   Klebsiella pneumoniae NOT DETECTED NOT DETECTED Final   Proteus species NOT DETECTED NOT DETECTED Final   Salmonella species NOT DETECTED NOT DETECTED Final   Serratia marcescens NOT DETECTED NOT DETECTED Final   Haemophilus influenzae NOT DETECTED NOT DETECTED Final   Neisseria meningitidis NOT DETECTED NOT DETECTED Final   Pseudomonas aeruginosa NOT DETECTED NOT DETECTED Final   Stenotrophomonas maltophilia NOT DETECTED NOT DETECTED Final   Candida albicans NOT DETECTED NOT DETECTED Final    Candida auris NOT DETECTED NOT DETECTED Final   Candida glabrata NOT DETECTED NOT DETECTED Final   Candida krusei NOT DETECTED NOT DETECTED Final   Candida parapsilosis NOT DETECTED NOT DETECTED Final   Candida tropicalis NOT DETECTED NOT DETECTED Final   Cryptococcus neoformans/gattii NOT DETECTED NOT DETECTED Final   Methicillin resistance mecA/C DETECTED (A) NOT DETECTED Final    Comment: CRITICAL RESULT CALLED TO, READ BACK BY AND VERIFIED WITH: PHARMD JEREMY FRENS ON 10/24/23 @ 1502 BY DRT Performed at [facility], [address] [place], [place] [address]   Urine Culture     Status: None   Collection Time: 10/23/23 11:49 PM   Specimen: Urine, Random  Result Value Ref Range Status   Specimen Description URINE, RANDOM  Final   Special Requests NONE Reflexed from [id number]  Final   Culture   Final    NO GROWTH Performed at [facility], [address] [place], [place] [address]    Report Status 10/24/2023 FINAL  Final  Respiratory (~20 pathogens) panel by PCR     Status: None   Collection Time: 10/24/23  2:02 AM   Specimen: Nasopharyngeal Swab; Respiratory  Result Value Ref Range Status   Adenovirus NOT DETECTED NOT DETECTED Final   Coronavirus 229E NOT DETECTED NOT DETECTED Final    Comment: (NOTE) The Coronavirus on the Respiratory Panel, DOES NOT test for the novel  Coronavirus (2019 nCoV)    Coronavirus HKU1 NOT DETECTED NOT DETECTED Final   Coronavirus NL63 NOT DETECTED NOT DETECTED Final   Coronavirus OC43 NOT DETECTED NOT DETECTED Final   Metapneumovirus NOT DETECTED NOT DETECTED Final   Rhinovirus / Enterovirus NOT DETECTED NOT DETECTED Final   Influenza A NOT DETECTED NOT DETECTED Final   Influenza B NOT DETECTED NOT DETECTED Final   Parainfluenza Virus 1 NOT DETECTED NOT DETECTED Final   Parainfluenza Virus 2 NOT DETECTED NOT DETECTED Final   Parainfluenza Virus 3 NOT DETECTED NOT DETECTED Final   Parainfluenza Virus 4 NOT DETECTED NOT DETECTED  Final   Respiratory Syncytial Virus NOT DETECTED NOT DETECTED Final   Bordetella pertussis NOT DETECTED NOT DETECTED Final   Bordetella Parapertussis NOT DETECTED NOT DETECTED Final   Chlamydophila pneumoniae NOT DETECTED NOT DETECTED Final   Mycoplasma pneumoniae NOT DETECTED NOT DETECTED Final    Comment: Performed at [facility], [address] [place], [place] [address]  MRSA Next Gen by PCR, Nasal     Status: None   Collection Time: 10/24/23  2:02 AM   Specimen: Nasopharyngeal Swab; Nasal Swab  Result Value Ref Range Status   MRSA by PCR Next Gen NOT DETECTED NOT DETECTED Final    Comment: (NOTE)  The GeneXpert MRSA Assay (FDA approved for NASAL specimens only), is one component of a comprehensive MRSA colonization surveillance program. It is not intended to diagnose MRSA infection nor to guide or monitor treatment for MRSA infections. Test performance is not FDA approved in patients less than 109 years old. Performed at [facility], [address] [place], [place] [address]     Radiology Report DG Swallowing Func-Speech Pathology Result Date: 10/25/2023 Table formatting from the original result was not included. Modified Barium Swallow Study Patient Details Name: Geniva Lohnes Hargett MRN: [id number] Date of Birth: [birth year] Today's Date: 10/25/2023 HPI/PMH: HPI: 73 yo female adm to [facility] with AMS, sepsis - Concern for cellulitis and pna.  She was found covered in feces and maggots.  Pt with PMH + for hiatal hernia, small distal ring, tertiary contractions seen on 2017 UGI study. Pt was in prep for bariatric surgery.  PMH also + for potential bipolar, hoarding, picking at sores and lives alone in a mobile home.  Swallow eval ordered due to pt not passing swallow screen.  Pt admits to issues with dysphagia causing her to cough and sensing food retention in distal pharynx causing her to propel into mouth and reswallow. Clinical Impression: Pt demonstrates a moderate oropharyngeal  dysphagia with a DIGEST score of 2. Patient initiates swallow when liquids have reached the pyriform sinuses. If taking a single sip, swallow function is otherwise adequate. But if the patient takes a large or sequential sip, the bolus hesitates above the PES and the second propulsive effort and hyoid burst pushes liquid into the airway and up to the nasopharynx. Pt gags, material moves to mouth and pt reswallows. When taking a pill there was significant aspiration of a larger thin bolus eliciting hard coughing. When cued to take one sip at a time from a straw or cup pt protected airway consistently. Observed that pt typically has a posterior tilt to head while swallowing. A more neutral or anterior position will also help. Esophageal sweep not possible due to body habitus. Pt agreeable to a trial of therapy to improve awareness of decreased rate but also wants to continue an unrestricted diet. Will resume regular/thin and f/u.  DIGEST Swallow Severity Rating*             Safety: 2             Efficiency:1             Overall Pharyngeal Swallow Severity: 2 1: mild; 2: moderate; 3: severe; 4: profound *The Dynamic Imaging Grade of Swallowing Toxicity is standardized for the head and neck cancer population, however, demonstrates promising clinical applications across populations to standardize the clinical rating of pharyngeal swallow safety and severity. Factors that may increase risk of adverse event in presence of aspiration Roderick Civatte & Jessy Morocco 2021): Factors that may increase risk of adverse event in presence of aspiration Roderick Civatte & Jessy Morocco 2021): Inadequate oral hygiene; Limited mobility Recommendations/Plan: Swallowing Evaluation Recommendations Swallowing Evaluation Recommendations Liquid Administration via: Cup; Straw Medication Administration: Whole meds with puree Supervision: Patient able to self-feed Swallowing strategies  : Slow rate; Small bites/sips; Minimize environmental distractions Postural changes:  Position pt fully upright for meals Oral care recommendations: Oral care BID (2x/day) Treatment Plan Treatment Plan Follow-up recommendations: Home health SLP Functional status assessment: Patient has had a recent decline in their functional status and demonstrates the ability to make significant improvements in function in a reasonable and predictable amount of time. Treatment frequency: Min 2x/week Treatment  duration: 2 weeks Interventions: Aspiration precaution training Recommendations Recommendations for follow up therapy are one component of a multi-disciplinary discharge planning process, led by the attending physician.  Recommendations may be updated based on patient status, additional functional criteria and insurance authorization. Assessment: Orofacial Exam: Orofacial Exam Oral Cavity: Oral Hygiene: Xerostomia Oral Cavity - Dentition: Poor condition Oral Motor/Sensory Function: WFL Anatomy: Anatomy: WFL Boluses Administered: Boluses Administered Boluses Administered: Thin liquids (Level 0); Mildly thick liquids (Level 2, nectar thick); Puree; Solid  Oral Impairment Domain: Oral Impairment Domain Lip Closure: No labial escape Tongue control during bolus hold: Cohesive bolus between tongue to palatal seal Bolus preparation/mastication: Slow prolonged chewing/mashing with complete recollection (missing dentition) Bolus transport/lingual motion: Brisk tongue motion Oral residue: Trace residue lining oral structures Location of oral residue : Tongue Initiation of pharyngeal swallow : Pyriform sinuses  Pharyngeal Impairment Domain: Pharyngeal Impairment Domain Soft palate elevation: Trace column of contrast or air between SP and PW; Escape to nasopharynx Laryngeal elevation: Complete superior movement of thyroid cartilage with complete approximation of arytenoids to epiglottic petiole Anterior hyoid excursion: Complete anterior movement Epiglottic movement: Complete inversion Laryngeal vestibule closure:  Incomplete, narrow column air/contrast in laryngeal vestibule Pharyngeal stripping wave : Present - complete Pharyngoesophageal segment opening: Complete distension and complete duration, no obstruction of flow Tongue base retraction: Trace column of contrast or air between tongue base and PPW Pharyngeal residue: Trace residue within or on pharyngeal structures Location of pharyngeal residue: Valleculae; Tongue base  Esophageal Impairment Domain: No data recorded Pill: Pill Consistency administered: Thin liquids (Level 0) Thin liquids (Level 0): Impaired (see clinical impressions) Penetration/Aspiration Scale Score: Penetration/Aspiration Scale Score 1.  Material does not enter airway: Thin liquids (Level 0); Mildly thick liquids (Level 2, nectar thick); Puree; Solid 4.  Material enters airway, CONTACTS cords then ejected out: Thin liquids (Level 0) 7.  Material enters airway, passes BELOW cords and not ejected out despite cough attempt by patient: Thin liquids (Level 0) Compensatory Strategies: Compensatory Strategies Compensatory strategies: Yes Oral bolus hold: Effective   General Information: Caregiver present: No  Diet Prior to this Study: NPO   Temperature : Normal   Respiratory Status: WFL   Supplemental O2: None (Room air)   History of Recent Intubation: No  Behavior/Cognition: Alert; Pleasant mood; Cooperative Self-Feeding Abilities: Able to self-feed Baseline vocal quality/speech: Dysphonic Volitional Cough: Able to elicit Volitional Swallow: Able to elicit No data recorded Goal Planning: Prognosis for improved oropharyngeal function: Fair No data recorded No data recorded Patient/Family Stated Goal: wants water  No data recorded Pain: Pain Assessment Pain Assessment: No/denies pain End of Session: Start Time:SLP Start Time (ACUTE ONLY): 1415 Stop Time: SLP Stop Time (ACUTE ONLY): 1440 Time Calculation:SLP Time Calculation (min) (ACUTE ONLY): 25 min Charges: SLP Evaluations $ SLP Speech Visit: 1 Visit SLP  Evaluations $BSS Swallow: 1 Procedure $MBS Swallow: 1 Procedure $Swallowing Treatment: 1 Procedure SLP visit diagnosis: SLP Visit Diagnosis: Dysphagia, oropharyngeal phase (R13.12) Past Medical History: Past Medical History: Diagnosis Date  Anemia   Anxiety   Arthritis   Bipolar disorder (HCC)   Bronchitis   COPD (chronic obstructive pulmonary disease) (HCC)   Depression   Diabetes mellitus   type II  Encounter for blood transfusion   GERD (gastroesophageal reflux disease)   Hearing loss   Hyperlipemia   Hypertension   Nonunion, 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  Peripheral neuropathy   Pneumonia   hx of   Shortness of breath   with exertion   Skin  yeast infection   history of under bilateral breast,, pannus, inner legs  Sleep apnea   uses bipap- does not know setting s Past Surgical History: Past Surgical History: Procedure Laterality Date  COLONOSCOPY  10/22/2011  Procedure: COLONOSCOPY;  Surgeon: Almeda Aris, MD;  Location: [facility];  Service: Endoscopy;  Laterality: N/A;  COLONOSCOPY WITH PROPOFOL  N/A 01/21/2017  Procedure: COLONOSCOPY WITH PROPOFOL ;  Surgeon: Alvis Jourdain, MD;  Location: [facility];  Service: Endoscopy;  Laterality: N/A;  ESOPHAGOGASTRODUODENOSCOPY  10/22/2011  Procedure: ESOPHAGOGASTRODUODENOSCOPY (EGD);  Surgeon: Almeda Aris, MD;  Location: [facility];  Service: Endoscopy;  Laterality: N/A;  femur fx    FEMUR IM NAIL Right 01/08/2014  Procedure: INTRAMEDULLARY (IM) NAIL RIGHT HIP WITH INFUSED ALLOGRAFTING;  Surgeon: Arlette Lagos, MD;  [facility]: [facility];  Service: Orthopedics;  Laterality: Right;  HARDWARE REMOVAL  03/27/2012  Procedure: HARDWARE REMOVAL;  Surgeon: Bevin Bucks, MD;  Location: [facility];  Service: Orthopedics;  Laterality: Right;  HARDWARE REMOVAL Right 01/08/2014  Procedure: HARDWARE REMOVAL RIGHT PROXIMAL FEMUR;  Surgeon: Arlette Lagos, MD;  [facility]: [facility];  Service: Orthopedics;  Laterality: Right;  HIP FRACTURE SURGERY    INTRAMEDULLARY (IM)  NAIL INTERTROCHANTERIC  03/27/2012  Procedure: INTRAMEDULLARY (IM) NAIL INTERTROCHANTRIC;  Surgeon: Bevin Bucks, MD;  Location: [facility];  Service: Orthopedics;  Laterality: Right;  Possible Exchange with a Shorter Trochantric Nail  OPEN REDUCTION INTERNAL FIXATION (ORIF) TIBIA/FIBULA FRACTURE Right 06/03/2022  Procedure: OPEN REDUCTION INTERNAL FIXATION (ORIF) OF RIGHT TIBIA;  Surgeon: Hardy Lia, MD;  Location: [facility];  Service: Orthopedics;  Laterality: Right;  TONSILLECTOMY   DeBlois, Hardin Leys 10/25/2023, 3:34 PM  DG Chest Port 1 View Result Date: 10/25/2023 CLINICAL DATA:  73 year old female with shortness of breath. Sepsis. EXAM: PORTABLE CHEST 1 VIEW COMPARISON:  CT Chest, Abdomen, and Pelvis 10/23/2023. FINDINGS: Portable AP semi upright view at 0550 hours. Stable low lung volumes. Small layering pleural effusions better demonstrated on the recent CT. Stable mediastinal contours, heart size normal on the recent CT. Visualized tracheal air column is within normal limits. No pneumothorax. Pulmonary vascular congestion appears regressed compared to 10/23/2023 portable exam, no overt edema now. No air bronchograms. Paucity of bowel gas in the upper abdomen. Stable visualized osseous structures. IMPRESSION: 1. Regressed pulmonary edema since 10/23/2023. Small layering pleural effusions better demonstrated on recent CT. 2. No new cardiopulmonary abnormality. Electronically Signed   By: Marlise Simpers M.D.   On: 10/25/2023 06:07   CT CHEST ABDOMEN PELVIS WO CONTRAST Result Date: 10/24/2023 CLINICAL DATA:  Sepsis, head and neck trauma EXAM: CT CHEST, ABDOMEN AND PELVIS WITHOUT CONTRAST TECHNIQUE: Multidetector CT imaging of the chest, abdomen and pelvis was performed following the standard protocol without IV contrast. RADIATION DOSE REDUCTION: This exam was performed according to the departmental dose-optimization program which includes automated exposure control, adjustment of the mA and/or kV according to  patient size and/or use of iterative reconstruction technique. COMPARISON:  Same day chest radiograph and CT chest 01/10/2008 FINDINGS: CT CHEST FINDINGS Cardiovascular: Normal heart size. No pericardial effusion. Mitral annular calcification. Normal caliber thoracic aorta. Aortic and coronary artery calcification. Mediastinum/Nodes: No mediastinal hematoma. Small hiatal hernia. Trachea is patent. No thoracic adenopathy. Lungs/Pleura: Respiratory motion obscures detail. Geographic ground-glass opacities and interlobular septal thickening in the upper lobes. Small right and trace left pleural effusions. No pneumothorax. Musculoskeletal: No acute fracture. CT ABDOMEN PELVIS FINDINGS Hepatobiliary: Distended gallbladder with large gallstones in the gallbladder neck. No biliary dilation. No definite gallbladder  wall thickening or pericholecystic fluid noting limitations of motion. Unremarkable noncontrast appearance of the liver. Pancreas: Unremarkable. Spleen: Unremarkable. Adrenals/Urinary Tract: Normal adrenal glands. No urinary calculi or hydronephrosis. Unremarkable bladder. Stomach/Bowel: Normal caliber large and small bowel. No bowel wall thickening. Stomach is within normal limits. Vascular/Lymphatic: Aortic atherosclerotic calcification. No lymphadenopathy. Reproductive: Uterus and bilateral adnexa are unremarkable. Other: No free intraperitoneal fluid or air. Musculoskeletal: IM rod and screw fixation about the right femur with lucency about the femoral stem component compatible with loosening. No acute fracture. Advanced lumbar spondylosis. IMPRESSION: 1. Geographic ground-glass opacities and interlobular septal thickening in the upper lobes. Distribution favors atypical infection. Edema not excluded. Small right and trace left pleural effusions. 2. Distended gallbladder with large gallstones in the gallbladder neck. No definite gallbladder wall thickening or pericholecystic fluid noting limitations of  motion. If there is concern for acute cholecystitis, right upper quadrant ultrasound is recommended. 3. Loosening about the right femur hardware. 4. Aortic Atherosclerosis (ICD10-I70.0). Electronically Signed   By: Rozell Cornet M.D.   On: 10/24/2023 00:23   CT Head Wo Contrast Result Date: 10/23/2023 CLINICAL DATA:  Code sepsis. Head trauma, moderate to severe. Neck trauma. EXAM: CT HEAD WITHOUT CONTRAST CT CERVICAL SPINE WITHOUT CONTRAST TECHNIQUE: Multidetector CT imaging of the head and cervical spine was performed following the standard protocol without intravenous contrast. Multiplanar CT image reconstructions of the cervical spine were also generated. RADIATION DOSE REDUCTION: This exam was performed according to the departmental dose-optimization program which includes automated exposure control, adjustment of the mA and/or kV according to patient size and/or use of iterative reconstruction technique. COMPARISON:  CT cervical spine 10/04/2008 FINDINGS: CT HEAD FINDINGS Brain: No intracranial hemorrhage, mass effect, or evidence of acute infarct. No hydrocephalus. No extra-axial fluid collection. Age related cerebral atrophy and chronic small vessel ischemic disease. 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 Vascular: No hyperdense vessel. Intracranial arterial calcification. Skull: No fracture or focal lesion. Sinuses/Orbits: No acute finding. Other: None. CT CERVICAL SPINE FINDINGS Alignment: No evidence of traumatic malalignment. Chronic anterolisthesis of C3 and C4. Skull base and vertebrae: No acute fracture. No primary bone lesion or focal pathologic process. Soft tissues and spinal canal: No prevertebral fluid or swelling. No visible canal hematoma. Disc levels: Multilevel spondylosis, disc space height loss, and degenerative endplate changes greatest at C4-C5 and C5-C6 where it is moderate. No severe spinal canal narrowing. Upper chest: Reported separately. Other: Carotid  calcification. IMPRESSION: 1. No acute intracranial abnormality. 2. No acute fracture in the cervical spine. Electronically Signed   By: Rozell Cornet M.D.   On: 10/23/2023 22:45   CT Cervical Spine Wo Contrast Result Date: 10/23/2023 CLINICAL DATA:  Code sepsis. Head trauma, moderate to severe. Neck trauma. EXAM: CT HEAD WITHOUT CONTRAST CT CERVICAL SPINE WITHOUT CONTRAST TECHNIQUE: Multidetector CT imaging of the head and cervical spine was performed following the standard protocol without intravenous contrast. Multiplanar CT image reconstructions of the cervical spine were also generated. RADIATION DOSE REDUCTION: This exam was performed according to the departmental dose-optimization program which includes automated exposure control, adjustment of the mA and/or kV according to patient size and/or use of iterative reconstruction technique. COMPARISON:  CT cervical spine 10/04/2008 FINDINGS: CT HEAD FINDINGS Brain: No intracranial hemorrhage, mass effect, or evidence of acute infarct. No hydrocephalus. No extra-axial fluid collection. Age related cerebral atrophy and chronic small vessel ischemic disease. 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 Vascular: No hyperdense vessel. Intracranial arterial calcification. Skull: No fracture  or focal lesion. Sinuses/Orbits: No acute finding. Other: None. CT CERVICAL SPINE FINDINGS Alignment: No evidence of traumatic malalignment. Chronic anterolisthesis of C3 and C4. Skull base and vertebrae: No acute fracture. No primary bone lesion or focal pathologic process. Soft tissues and spinal canal: No prevertebral fluid or swelling. No visible canal hematoma. Disc levels: Multilevel spondylosis, disc space height loss, and degenerative endplate changes greatest at C4-C5 and C5-C6 where it is moderate. No severe spinal canal narrowing. Upper chest: Reported separately. Other: Carotid calcification. IMPRESSION: 1. No acute intracranial abnormality. 2. No  acute fracture in the cervical spine. Electronically Signed   By: Rozell Cornet M.D.   On: 10/23/2023 22:45   DG Pelvis Portable Result Date: 10/23/2023 CLINICAL DATA:  Fall. EXAM: PORTABLE PELVIS 1-2 VIEWS COMPARISON:  05/30/2022 FINDINGS: Evidence of remote posttraumatic and postsurgical changes in the right femur. Lucency around the femoral neck screws and intramedullary nail likely reflects loosening. This is stable when compared to prior study. No acute fracture, subluxation or dislocation. Mild degenerative changes in the hip joints bilaterally. IMPRESSION: No acute bony abnormality. Lucency around the right femoral hardware likely reflects loosening, unchanged since 05/30/2022. Electronically Signed   By: Janeece Mechanic M.D.   On: 10/23/2023 21:46   DG Chest Port 1 View Result Date: 10/23/2023 CLINICAL DATA:  Questionable sepsis.  Fall. EXAM: PORTABLE CHEST 1 VIEW COMPARISON:  05/30/2022 FINDINGS: Heart mediastinal contours are within normal limits. Perihilar opacities and interstitial prominence throughout the lungs. No effusions or pneumothorax. No acute bony abnormality. IMPRESSION: Perihilar opacities and interstitial prominence throughout the lungs could reflect edema or infection. Atypical/viral infection possible. Electronically Signed   By: Janeece Mechanic M.D.   On: 10/23/2023 21:44     Signature  -   Lynnwood Sauer M.D on 10/25/2023 at 4:27 PM   -  To page go to [email or web address]

## 2023-10-25 NOTE — Evaluation (Signed)
 Clinical/Bedside Swallow Evaluation Patient Details  Name: [name] [name] MRN: [id number] Date of Birth: 10-Mar-1951  Today's Date: 10/25/2023 Time: SLP Start Time (ACUTE ONLY): 0805 SLP Stop Time (ACUTE ONLY): 0825 SLP Time Calculation (min) (ACUTE ONLY): 20 min  Past Medical History:  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   Peripheral neuropathy    Pneumonia    hx of    Shortness of breath    with exertion    Skin yeast infection    history of under bilateral breast,, pannus, inner legs   Sleep apnea    uses bipap- does not know setting s   Past Surgical History:  Past Surgical History:  Procedure Laterality Date   COLONOSCOPY  10/22/2011   Procedure: COLONOSCOPY;  Surgeon: Almeda Aris, MD;  Location: [facility];  Service: Endoscopy;  Laterality: N/A;   COLONOSCOPY WITH PROPOFOL  N/A 01/21/2017   Procedure: COLONOSCOPY WITH PROPOFOL ;  Surgeon: Alvis Jourdain, MD;  Location: [facility];  Service: Endoscopy;  Laterality: N/A;   ESOPHAGOGASTRODUODENOSCOPY  10/22/2011   Procedure: ESOPHAGOGASTRODUODENOSCOPY (EGD);  Surgeon: Almeda Aris, MD;  Location: [facility];  Service: Endoscopy;  Laterality: N/A;   femur fx     FEMUR IM NAIL Right 01/08/2014   Procedure: INTRAMEDULLARY (IM) NAIL RIGHT HIP WITH INFUSED ALLOGRAFTING;  Surgeon: Arlette Lagos, MD;  Location: [facility];  Service: Orthopedics;  Laterality: Right;   HARDWARE REMOVAL  03/27/2012   Procedure: HARDWARE REMOVAL;  Surgeon: Bevin Bucks, MD;  Location: [facility];  Service: Orthopedics;  Laterality: Right;   HARDWARE REMOVAL Right 01/08/2014   Procedure:  HARDWARE REMOVAL RIGHT PROXIMAL FEMUR;  Surgeon: Arlette Lagos, MD;  Location: [facility];  Service: Orthopedics;  Laterality: Right;   HIP FRACTURE SURGERY     INTRAMEDULLARY (IM) NAIL INTERTROCHANTERIC  03/27/2012   Procedure: INTRAMEDULLARY (IM) NAIL INTERTROCHANTRIC;  Surgeon: Bevin Bucks, MD;  Location: [facility];  Service: Orthopedics;  Laterality: Right;  Possible Exchange with a Shorter Trochantric Nail   OPEN REDUCTION INTERNAL FIXATION (ORIF) TIBIA/FIBULA FRACTURE Right 06/03/2022   Procedure: OPEN REDUCTION INTERNAL FIXATION (ORIF) OF RIGHT TIBIA;  Surgeon: Hardy Lia, MD;  Location: [facility];  Service: Orthopedics;  Laterality: Right;   TONSILLECTOMY     HPI:  73 yo female adm to [facility] with AMS, sepsis - Concern for cellulitis and pna.  She was found covered in feces and maggots.  Pt with PMH + for hiatal hernia, small distal ring, tertiary contractions seen on 2017 UGI study. Pt was in prep for bariatric surgery.  PMH also + for potential bipolar, hoarding, picking at sores and lives alone in a mobile home.  Swallow eval ordered due to pt not passing swallow screen.  Pt admits to issues with dysphagia causing her to cough and sensing food retention in distal pharynx causing her to propel into mouth and reswallow.    Assessment / Plan / Recommendation  Clinical Impression  Patient has known h/o esophageal dysphagia seen on UGI in 2017  - she  denies having esophageal surgeries.  Currently concerns present for possible pharyngeal/laryngeal deficits as well.  Pt with no focal CN deficits except she is severely dysphonic - whispered concerning for vocal fold integrity.  She was observed consuming ice chips, thin water  via tsp and cup and nectar thick liquids via cup.  Patient demonstrates overt coughing with approx 90% of bolus offerings and reports sensation of boluses transiting through left pharynx initially.  After intake of approx 2 ounces liquids, she demonstrates overt coughing and  expectoration of frothy secretions - ? esophageal source.  Patient endorses voice changes (but does not coorelated to coughing) being gradual onset but worsening and has remote h/o smoking for 49 years.  At this time, recommend pt undergo MBS to allow instrumental evaluation of swallowing pending po intake besides small amounts of water  and ice chips.  Pt educated and agreeable to plan. SLP Visit Diagnosis: Dysphagia, unspecified (R13.10)    Aspiration Risk  Moderate aspiration risk;Risk for inadequate nutrition/hydration    Diet Recommendation Ice chips PRN after oral care;Free water  protocol after oral care    Medication Administration: Via alternative means    Other  Recommendations Oral Care Recommendations: Oral care QID        Functional Status Assessment Patient has had a recent decline in their functional status and demonstrates the ability to make significant improvements in function in a reasonable and predictable amount of time.  Frequency and Duration min 1 x/week  1 week       Prognosis Prognosis for improved oropharyngeal function: Fair Barriers to Reach Goals: Severity of deficits      Swallow Study   General Date of Onset: 10/25/23 HPI: 73 yo female adm to [facility] with AMS, sepsis - Concern for cellulitis and pna.  She was found covered in feces and maggots.  Pt with PMH + for hiatal hernia, small distal ring, tertiary contractions seen on 2017 UGI study. Pt was in prep for bariatric surgery.  PMH also + for potential bipolar, hoarding, picking at sores and lives alone in a mobile home.  Swallow eval ordered due to pt not passing swallow screen.  Pt admits to issues with dysphagia causing her to cough and sensing food retention in distal pharynx causing her to propel into mouth and reswallow. Diet Prior to this Study: NPO Temperature Spikes Noted: No Respiratory Status: Nasal cannula History of Recent Intubation: No Behavior/Cognition: Alert;Pleasant mood;Cooperative Oral  Cavity Assessment: Dry;Erythema Oral Care Completed by SLP: No Oral Cavity - Dentition: Poor condition Vision: Functional for self-feeding Self-Feeding Abilities: Able to feed self Patient Positioning: Upright in bed Baseline Vocal Quality: Breathy;Other (comment) Volitional Cough: Weak Volitional Swallow: Able to elicit    Oral/Motor/Sensory Function Overall Oral Motor/Sensory Function: Within functional limits   Ice Chips Ice chips: Within functional limits   Thin Liquid Thin Liquid: Impaired Presentation: Cup;Self Fed;Spoon Pharyngeal  Phase Impairments: Cough - Immediate    Nectar Thick Nectar Thick Liquid: Impaired Presentation: Cup;Self Fed Pharyngeal Phase Impairments: Cough - Immediate   Honey Thick Honey Thick Liquid: Not tested   Puree Puree: Not tested   Solid     Solid: Not tested      Chantal Comment 10/25/2023,8:54 AM   Maudie Sorrow, MS [facility] SLP Acute Rehab Services Office [phone]

## 2023-10-25 NOTE — Discharge Instructions (Signed)

## 2023-10-25 NOTE — [place] FL2 (Signed)
 [place]  MEDICAID FL2 LEVEL OF CARE FORM     IDENTIFICATION  Patient Name: [name] [name] Birthdate: 08-19-1950 Sex: female Admission Date (Current Location): 10/23/2023  [facility] and [place] Number:  Producer, television/film/video and Address:  [facility], [address], [place], [place] [address]      Provider Number: [id number]  Attending Physician Name and Address:  Cala Castleman, MD  Relative Name and Phone Number:       Current Level of Care: Hospital Recommended Level of Care: Skilled Nursing Facility Prior Approval Number:    Date Approved/Denied:   PASRR Number: [id number]  Discharge Plan: SNF    Current Diagnoses: Patient Active Problem List   Diagnosis Date Noted   Severe sepsis (HCC) 10/24/2023   Cellulitis all over abdomen and thigh area 10/24/2023   Maggot infestation 10/24/2023   Acute cystitis 10/24/2023   Rhabdomyolysis 10/24/2023   History of COPD 10/24/2023   Metabolic acidosis 10/24/2023   Acute kidney injury superimposed on chronic kidney disease (HCC) 10/24/2023   Hyperlipidemia 10/24/2023   Transaminitis 10/24/2023   Hypothyroidism 10/24/2023   Acute hypoxic respiratory failure (HCC) 10/24/2023   Acute metabolic encephalopathy 10/24/2023   Fall at home, initial encounter 08/06/2022   Closed right tibial fracture 05/31/2022   Periprosthetic fracture of shaft of femur 05/30/2022   Non-insulin  dependent type 2 diabetes mellitus (HCC)    Peripheral neuropathy    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 01/08/2014   COPD (chronic obstructive pulmonary disease) (HCC)    Essential hypertension    GERD (gastroesophageal reflux disease)    Depression    Bipolar disorder (HCC)    Anxiety    Acute blood loss anemia 03/28/2012   Morbid obesity with BMI of 45.0-49.9, adult (HCC) 03/28/2012   Hyponatremia 03/28/2012   Retained hardware right hip 03/28/2012    Orientation RESPIRATION BLADDER Height &  Weight     Self, Place  O2 (4L nasal cannula) External catheter, Incontinent Weight: 259 lb 11.2 oz (117.8 kg) Height:  5\' 2"  (157.5 cm)  BEHAVIORAL SYMPTOMS/MOOD NEUROLOGICAL BOWEL NUTRITION STATUS      Continent Diet (see dc summary)  AMBULATORY STATUS COMMUNICATION OF NEEDS Skin   Extensive Assist Verbally PU Stage and Appropriate Care, Other (Comment) (Deept tissue injury on face, knee, toe, hand; unstageable on breast and abdomen; Dermatitus on perineum and breast)                       Personal Care Assistance Level of Assistance  Bathing, Feeding, Dressing Bathing Assistance: Maximum assistance Feeding assistance: Limited assistance Dressing Assistance: Limited assistance     Functional Limitations Info  Sight Sight Info: Impaired (Glasses)        SPECIAL CARE FACTORS FREQUENCY  PT (By licensed PT), OT (By licensed OT)     PT Frequency: 5x/week OT Frequency: 5x/week            Contractures Contractures Info: Not present    Additional Factors Info  Code Status, Allergies Code Status Info: DNR Allergies Info: Coffee Flavoring Agent (Non-screening), Prednisone, Other           Current Medications (10/25/2023):  This is the current hospital active medication list Current Facility-Administered Medications  Medication Dose Route Frequency Provider Last Rate Last Admin   ceFEPIme (MAXIPIME) 2 g in sodium chloride  0.9 % 100 mL IVPB  2 g Intravenous Q12H Young Hensen, RPH 200 mL/hr at 10/25/23 [phone]  2 g at 10/25/23 0916   clorazepate (TRANXENE) tablet 7.5 mg  7.5 mg Oral BID PRN Lorita Rosa, MD       dextrose  5 % in lactated ringers  infusion   Intravenous Continuous Singh, Prashant K, MD 100 mL/hr at 10/25/23 0910 Rate Change at 10/25/23 0910   dextrose  50 % solution 50 mL  50 mL Intravenous PRN Sundil, Subrina, MD   50 mL at 10/24/23 2044   doxycycline (VIBRAMYCIN) 100 mg in sodium chloride  0.9 % 250 mL IVPB  100 mg Intravenous Q12H Singh, Prashant K, MD  125 mL/hr at 10/25/23 1322 100 mg at 10/25/23 1322   heparin injection 5,000 Units  5,000 Units Subcutaneous Q8H Sundil, Subrina, MD   5,000 Units at 10/25/23 1329   ipratropium-albuterol  (DUONEB) 0.5-2.5 (3) MG/3ML nebulizer solution 3 mL  3 mL Nebulization Q4H PRN Sundil, Subrina, MD       ketorolac (TORADOL) 30 MG/ML injection 15 mg  15 mg Intravenous Q8H PRN Singh, Prashant K, MD   15 mg at 10/25/23 1031   lactated ringers  infusion   Intravenous Continuous Angelene Kelly, MD 100 mL/hr at 10/24/23 2141 New Bag at 10/24/23 2141   leptospermum manuka honey (MEDIHONEY) paste 1 Application  1 Application Topical Daily Lorita Rosa, MD   1 Application at 10/25/23 1059   levothyroxine  (SYNTHROID ) tablet 25 mcg  25 mcg Oral Q0600 Lorita Rosa, MD       midodrine (PROAMATINE) tablet 10 mg  10 mg Oral TID WC Singh, Prashant K, MD       ondansetron  (ZOFRAN ) tablet 4 mg  4 mg Oral Q6H PRN Sundil, Subrina, MD       Or   ondansetron  (ZOFRAN ) injection 4 mg  4 mg Intravenous Q6H PRN Sundil, Subrina, MD       pantoprazole  (PROTONIX ) injection 40 mg  40 mg Intravenous Q24H Singh, Prashant K, MD   40 mg at 10/25/23 1322   potassium PHOSPHATE 30 mmol in dextrose  5 % 500 mL infusion  30 mmol Intravenous Once Singh, Prashant K, MD 85 mL/hr at 10/25/23 1104 30 mmol at 10/25/23 1104   senna-docusate (Senokot-S) tablet 1 tablet  1 tablet Oral QHS PRN Sundil, Subrina, MD       silver sulfADIAZINE (SILVADENE) 1 % cream   Topical Daily Lorita Rosa, MD   Given at 10/25/23 1059   sodium chloride  flush (NS) 0.9 % injection 3 mL  3 mL Intravenous Q12H Sundil, Subrina, MD   3 mL at 10/25/23 1059     Discharge Medications: Please see discharge summary for a list of discharge medications.  Relevant Imaging Results:  Relevant Lab Results:   Additional Information SSN: [id number]  Xareni Kelch S Jaquavius Hudler, LCSW

## 2023-10-25 NOTE — TOC Initial Note (Signed)
 Transition of Care [facility]) - Initial/Assessment Note    Patient Details  Name: [name] [name] MRN: [id number] Date of Birth: [birth year]  Transition of Care [facility]) CM/SW Contact:    [name], LCSW Phone Number: 10/25/2023, 1:57 PM  Clinical Narrative:                 1:57 PM-CSW left voicemail for [name] to discuss SNF recommendation.   Expected Discharge Plan: Skilled Nursing Facility Barriers to Discharge: Continued Medical Work up, English as a second language teacher, SNF Pending bed offer   Patient Goals and CMS Choice            Expected Discharge Plan and Services In-house Referral: Clinical Social Work   Post Acute Care Choice: Skilled Nursing Facility Living arrangements for the past 2 months: Mobile Home                                      Prior Living Arrangements/Services Living arrangements for the past 2 months: Mobile Home Lives with:: Self Patient language and need for interpreter reviewed:: Yes Do you feel safe going back to the place where you live?: No      Need for Family Participation in Patient Care: Yes (Comment) Care giver support system in place?: Yes (comment) Current home services: DME (WC, CPAP machine, raised toilet seat, RW) Criminal Activity/Legal Involvement Pertinent to Current Situation/Hospitalization: No - Comment as needed  Activities of Daily Living      Permission Sought/Granted Permission sought to share information with : Facility Medical sales representative, Family Supports Permission granted to share information with : No  Share Information with NAME: [name]        Permission granted to share info w Contact Information: [phone]  Emotional Assessment Appearance:: Appears stated age Attitude/Demeanor/Rapport: Unable to Assess Affect (typically observed): Unable to Assess Orientation: : Oriented to Self, Oriented to Place Alcohol  / Substance Use: Not Applicable Psych Involvement: No (comment)  Admission diagnosis:   Fall, initial encounter [W19.XXXA] Sepsis (HCC) [A41.9] Sepsis, due to unspecified organism, unspecified whether acute organ dysfunction present [facility]) [A41.9] Patient Active Problem List   Diagnosis Date Noted   Severe sepsis (HCC) 10/24/2023   Cellulitis all over abdomen and thigh area 10/24/2023   Maggot infestation 10/24/2023   Acute cystitis 10/24/2023   Rhabdomyolysis 10/24/2023   History of COPD 10/24/2023   Metabolic acidosis 10/24/2023   Acute kidney injury superimposed on chronic kidney disease (HCC) 10/24/2023   Hyperlipidemia 10/24/2023   Transaminitis 10/24/2023   Hypothyroidism 10/24/2023   Acute hypoxic respiratory failure (HCC) 10/24/2023   Acute metabolic encephalopathy 10/24/2023   Fall at home, initial encounter 08/06/2022   Closed right tibial fracture 05/31/2022   Periprosthetic fracture of shaft of femur 05/30/2022   Non-insulin  dependent type 2 diabetes mellitus (HCC)    Peripheral neuropathy    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 [place] [address] Phone: [phone] Fax: [phone]     Social Drivers of Health (SDOH) Social History: SDOH Screenings   Food Insecurity: Food Insecurity Present (10/25/2023)  Housing: High Risk (10/25/2023)  Transportation Needs: Unmet Transportation Needs (10/25/2023)  Utilities: At Risk (10/25/2023)  Social Connections: Socially Isolated (10/25/2023)  Tobacco Use: Medium Risk  (10/24/2023)   SDOH Interventions: Food Insecurity Interventions: Patient Unable to Answer, Inpatient TOC Housing Interventions: Inpatient TOC, Patient Unable to Answer Transportation Interventions: Inpatient TOC Utilities Interventions: Inpatient TOC, Patient Unable to Answer Social Connections Interventions: Inpatient [facility]   Readmission Risk Interventions    06/01/2022   12:03 PM  Readmission Risk Prevention Plan  Post Dischage Appt Complete  Medication Screening Complete  Transportation Screening Complete

## 2023-10-25 NOTE — Evaluation (Signed)
 Physical Therapy Evaluation Patient Details Name: [name] [name] MRN: [id number] DOB: 04/14/1951 Today's Date: 10/25/2023  History of Present Illness  Patient is 72 y.o. female who presented on 6/8 after being found down during a welfare check. Pt admitted to [facility] with AMS, sepsis from CAP, and cellulitis with maggot infestation. Pt with PMH significant for hiatal hernia, small distal ring, tertiary contractions seen on 2017 UGI study, bipolar disorder, hoarding, COPD, HTN, HLD, DM, hx of femoral neck fx with malunion(stable per ortho on prior admission),GERD, OA, anemia, anxiety/depression.   Clinical Impression  [name] [name] is 73 y.o. female admitted with above HPI and diagnosis. Patient is currently limited by functional impairments below (see PT problem list). Patient lives alone and reports mod ind with WC for mobility in home however reports very limited with mobility and poor ability to complete ADL's at baseline. Pt limited greatly by pain from wounds/skin breakdown and weakness. Pt requires Total Assist +2 for bed mobility. Unsafe to transfer OOB today as no maxi-move lift sling available for safe return to bed. Patient will benefit from continued skilled PT interventions to address impairments and progress independence with mobility. Patient will benefit from continued inpatient follow up therapy, <3 hours/day. Acute PT will follow and progress as able.       If plan is discharge home, recommend the following: Two people to help with walking and/or transfers;Two people to help with bathing/dressing/bathroom;Assistance with cooking/housework;Direct supervision/assist for medications management;Assist for transportation;Help with stairs or ramp for entrance   Can travel by private vehicle   No    Equipment Recommendations None recommended by PT (TBD)  Recommendations for Other Services       Functional Status Assessment Patient has had a recent decline in their functional status and  demonstrates the ability to make significant improvements in function in a reasonable and predictable amount of time.     Precautions / Restrictions Precautions Precautions: Fall Recall of Precautions/Restrictions: Intact Restrictions Weight Bearing Restrictions Per Provider Order: No      Mobility  Bed Mobility Overal bed mobility: Needs Assistance Bed Mobility: Supine to Sit, Sit to Supine     Supine to sit: Total assist, +2 for safety/equipment, +2 for physical assistance Sit to supine: Total assist, +2 for physical assistance, +2 for safety/equipment   General bed mobility comments: +2 Total assist for all aspects    Transfers                        Ambulation/Gait                  Stairs            Wheelchair Mobility     Tilt Bed    Modified Rankin (Stroke Patients Only)       Balance Overall balance assessment: Needs assistance Sitting-balance support: Feet supported Sitting balance-Leahy Scale: Fair       Standing balance-Leahy Scale: Zero Standing balance comment: NT                             Pertinent Vitals/Pain Pain Assessment Pain Assessment: No/denies pain    Home Living Family/patient expects to be discharged to:: Private residence Living Arrangements: Alone Available Help at Discharge: Family Type of Home: Mobile home Home Access: Ramped entrance       Home Layout: One level Home Equipment: Wheelchair - manual Additional Comments: entire home is condemed  per pt.    Prior Function               Mobility Comments: use WC and uses bil UE or bil LE., uses RW to get into bathroom ADLs Comments: cannot get into bathroom. uses BSC and then dumps the bucket in the toilet. to shower sits on bench and washes in tub. last time. pt states attempted 1 week ago but just uses wash cloths.     Extremity/Trunk Assessment   Upper Extremity Assessment Upper Extremity Assessment: Defer to OT  evaluation    Lower Extremity Assessment Lower Extremity Assessment: Defer to PT evaluation    Cervical / Trunk Assessment Cervical / Trunk Assessment: Other exceptions Cervical / Trunk Exceptions: habitus  Communication   Communication Communication: No apparent difficulties    Cognition Arousal: Alert Behavior During Therapy: WFL for tasks assessed/performed   PT - Cognitive impairments: No apparent impairments                         Following commands: Intact       Cueing Cueing Techniques: Verbal cues     General Comments      Exercises     Assessment/Plan    PT Assessment Patient needs continued PT services  PT Problem List Decreased strength;Decreased range of motion;Decreased activity tolerance;Decreased balance;Decreased coordination;Decreased mobility;Decreased cognition;Decreased knowledge of use of DME;Decreased safety awareness;Decreased knowledge of precautions;Obesity       PT Treatment Interventions DME instruction;Gait training;Stair training;Functional mobility training;Therapeutic activities;Therapeutic exercise;Balance training;Neuromuscular re-education;Cognitive remediation;Patient/family education;Wheelchair mobility training    PT Goals (Current goals can be found in the Care Plan section)  Acute Rehab PT Goals Patient Stated Goal: get back home, but no real goals stated by pt PT Goal Formulation: With patient Time For Goal Achievement: 11/08/23 Potential to Achieve Goals: Fair    Frequency Min 2X/week     Co-evaluation               AM-PAC PT "6 Clicks" Mobility  Outcome Measure Help needed turning from your back to your side while in a flat bed without using bedrails?: Total Help needed moving from lying on your back to sitting on the side of a flat bed without using bedrails?: Total Help needed moving to and from a bed to a chair (including a wheelchair)?: Total Help needed standing up from a chair using your arms  (e.g., wheelchair or bedside chair)?: Total Help needed to walk in hospital room?: Total Help needed climbing 3-5 steps with a railing? : Total 6 Click Score: 6    End of Session   Activity Tolerance: Patient tolerated treatment well Patient left: with call bell/phone within reach Nurse Communication: Mobility status PT Visit Diagnosis: Other abnormalities of gait and mobility (R26.89);Muscle weakness (generalized) (M62.81);Difficulty in walking, not elsewhere classified (R26.2);History of falling (Z91.81)    Time: 0981-1914 PT Time Calculation (min) (ACUTE ONLY): 37 min   Charges:   PT Evaluation $PT Eval Moderate Complexity: 1 Mod PT Treatments $Therapeutic Activity: 8-22 mins PT General Charges $$ ACUTE PT VISIT: 1 Visit         Tish Forge, DPT Acute Rehabilitation Services Office [phone]  10/25/23 2:04 PM

## 2023-10-25 NOTE — Consult Note (Signed)
 Consultation Note Date: 10/25/2023   Patient Name: [name] [name]  DOB: 1950/12/10  MRN: [id number]  Age / Sex: 73 y.o., female  PCP: Lory Rough., PA-C Referring Physician: Cala Castleman, MD  Reason for Consultation: Establishing goals of care  HPI/Patient Profile: 73 y.o. female admitted on 10/23/2023 with  with past medical history significant of COPD, essential hypertension, hyperlipidemia, DM type II, peripheral neuropathy, chronic right femoral fracture, right proximal tibial fracture status post ORIF, chronic constipation, chronic depressive disorder, bipolar disorder, hypothyroidism, hoarding behavior, vitamin D  deficiency, and morbid obesity presented emergency department via EMS as being called out for a welfare check.     Patient was found on ground in her own urine and feces with maggots crawling on her seen by EMS.  Unknown downtime and fall.   Sister [name] [name]) at bedside reported that patient normally calls once a week so called a welfare check on her when she didn't hear from her. She last spoke on phone last Saturday and patient appeared at her baseline. Reports patient's house is a [employer] loss [employer] house .  Patient and family face treatment option decisions, advanced directive decisions and anticipatory care needs.   Clinical Assessment and Goals of Care:  This NP Thena Fireman reviewed medical records, received report from team, assessed the patient and then meet at the patient's bedside  to discuss diagnosis, prognosis, GOC, EOL wishes disposition and options.  No family at bedside.  Patient was lethargic and intermittently confused.          Was able to return to the bedside later in the afternoon and found [name] to be alert and oriented and engaged in our conversation.      Then later in the day  I was also able to reach her H POA/ friend/ [name] [name].  [name] [name] tells  me that she will bring in documents supporting healthcare power of attorney   Concept of Palliative Care was introduced as specialized medical care for people and their families living with serious illness.  If focuses on providing relief from the symptoms and stress of a serious illness.  The goal is to improve quality of life for both the patient and the family. Values and goals of care important to patient and family were attempted to be elicited.  Created space and opportunity for patient  and HPOA to explore thoughts and feelings regarding current medical situation.  Both are able to express concerns regarding future anticipatory care needs, both strongly express patient's desire to remain independent at home.  [name] [name] expresses that hopefully while patient is at [facility] for short-term rehab after discharge, family and friends can prepare the home for possible return. There have been concerns in the past regarding safety at home, APS involved at some point.      A  discussion was had today regarding advanced directives.  Concepts specific to code status, artifical feeding and hydration, continued IV antibiotics and rehospitalization was had.     Questions and concerns addressed.  Patient and  family   encouraged to call with questions or concerns.     PMT will continue to support holistically.                 SUMMARY OF RECOMMENDATIONS    - Patient is open to all offered and available medical interventions to prolong life  - Treat the treatable, hope for improvement - Transition to SNF for short-term rehab and then ultimately home - Recommend outpatient palliative services  Code Status/Advance Care Planning: Limited code   Symptom Management:  Per attending  Palliative Prophylaxis:  Delirium Protocol and Frequent Pain Assessment  Additional Recommendations (Limitations, Scope, Preferences): Full Scope Treatment  Psycho-social/Spiritual:  Desire for further Chaplaincy  support:no   Prognosis:  Unable to determine  Discharge Planning: Skilled Nursing Facility for rehab with Palliative care service follow-up      Primary Diagnoses: Present on Admission:  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 The following aspects are pertinent.  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   Peripheral neuropathy    Pneumonia    hx of    Shortness of breath    with exertion    Skin yeast infection    history of under bilateral breast,, pannus, inner legs   Sleep apnea    uses bipap- does not know setting s   Social History   Socioeconomic History   Marital status: Single    Spouse name: Not on file   Number of children: Not on file   Years of education: Not on file   Highest education level: Not on file  Occupational History   Not on file  Tobacco Use   Smoking status: Former    Current packs/day: 0.00    Types: Cigarettes    Quit date: 01/22/2008    Years since quitting: 15.7   Smokeless tobacco: Never  Vaping Use   Vaping status: Never Used  Substance and Sexual Activity   Alcohol  use: No   Drug use: No   Sexual activity: Not on file  Other Topics Concern   Not on file  Social History Narrative   Not on file   Social Drivers of Health   Financial Resource Strain: Not on file  Food Insecurity: Food Insecurity Present (10/25/2023)   Hunger Vital Sign    Worried About Running  Out of Food in the Last Year: Often true    Ran Out of Food in the Last Year: Often true  Transportation Needs: Unmet Transportation Needs (10/25/2023)   PRAPARE - Administrator, Civil Service (Medical): Yes    Lack of Transportation (Non-Medical): Yes  Physical Activity: Not on file  Stress: Not on file  Social Connections: Socially Isolated (10/25/2023)   Social Connection and Isolation Panel [NHANES]    Frequency of Communication with Friends and Family: Never    Frequency of  Social Gatherings with Friends and Family: Never    Attends Religious Services: Never    Database administrator or Organizations: No    Attends Engineer, structural: Never    Marital Status: Never married   Family History  Problem Relation Age of Onset   Arrhythmia Mother    Heart failure Mother    Diabetes Mellitus II Mother    Leukemia Father    Heart attack Father    Heart disease Father    Heart disease Sister    COPD Sister    Heart disease Brother    Other Sister        Factor 5   Leukemia Sister    Other Brother        instant death   Scheduled Meds:  heparin  5,000 Units Subcutaneous Q8H   leptospermum manuka honey  1 Application Topical Daily   levothyroxine   25 mcg Oral Q0600   silver sulfADIAZINE   Topical Daily   sodium chloride  flush  3 mL Intravenous Q12H   Continuous Infusions:  ceFEPime (MAXIPIME) IV 2 g (10/24/23 2142)   dextrose  5% lactated ringers  50 mL/hr at 10/25/23 0302   fluconazole (DIFLUCAN) IV Stopped (10/24/23 1812)   lactated ringers  100 mL/hr at 10/24/23 2141   vancomycin      PRN Meds:.clorazepate, dextrose , ipratropium-albuterol , ondansetron  **OR** ondansetron  (ZOFRAN ) IV, senna-docusate, sodium chloride  flush Medications Prior to Admission:  Prior to Admission medications   Medication Sig Start Date End Date Taking? Authorizing Provider  acetaminophen  (TYLENOL ) 325 MG tablet Take 2 tablets (650 mg total) by mouth every 4 (four) hours as  needed. 06/09/22  Yes Marisela Sicks, PA-C  cholecalciferol  (VITAMIN D ) 1000 UNITS tablet Take 2,000 Units by mouth daily.    Yes [provider]  clorazepate (TRANXENE) 7.5 MG tablet Take 7.5 mg by mouth 2 (two) times daily as needed for anxiety. 04/06/22  Yes [provider]  clotrimazole  (LOTRIMIN ) 1 % cream Apply to affected area 2 times daily 07/09/22  Yes Steinl, Kevin, MD  gabapentin  (NEURONTIN ) 600 MG tablet Take 600 mg by mouth at bedtime.   Yes [provider]  Ipratropium-Albuterol  (COMBIVENT  RESPIMAT) 20-100 MCG/ACT AERS respimat Inhale 2 puffs into the lungs 2 (two) times daily as needed for wheezing.    Yes [provider]  levothyroxine  (SYNTHROID ) 25 MCG tablet Take 25 mcg by mouth daily before breakfast.   Yes [provider]  lisinopril  (PRINIVIL ,ZESTRIL ) 20 MG tablet Take 20 mg by mouth daily.    Yes [provider]  metFORMIN  (GLUCOPHAGE ) 500 MG tablet Take 500 mg by mouth daily with breakfast.   Yes [provider]  neomycin-polymyxin-pramoxine (NEOSPORIN PLUS) 1 % cream Apply 1 application  topically daily as needed (wound care).   Yes [provider]  omeprazole (PRILOSEC) 20 MG capsule Take 20 mg by mouth at bedtime.    Yes [provider]  enoxaparin  (LOVENOX ) 80 MG/0.8ML injection Inject 0.7 mLs (70 mg total) into the skin daily for 14 days. Patient not taking: Reported on 10/24/2023 06/10/22 06/24/22  Marisela Sicks, PA-C   Allergies  Allergen Reactions   Coffee Flavoring Agent (Non-Screening) Nausea And Vomiting    Per pt she is allergic to the smell of coffee   Prednisone Other (See Comments)    Tightness in chest   Other Other (See Comments)    Plain nystatin  ointment-makes skin get worse Patient is allergic to any petroleum based ointment    Review of  Systems  Neurological:  Positive for weakness.    Physical Exam Constitutional:      Appearance: She is overweight. She is ill-appearing.   Cardiovascular:     Rate and Rhythm: Normal rate.  Pulmonary:     Effort: Pulmonary effort is normal.  Skin:    General: Skin is warm and dry.  Neurological:     Mental Status: She is alert and oriented to person, place, and time.     Vital Signs: BP (!) 143/73 (BP Location: Right Leg)   Pulse 88   Temp 98.2 F (36.8 C) (Oral)   Resp 17   Ht 5' 2 (1.575 m)   Wt 117.8 kg   SpO2 97%   BMI 47.50 kg/m  Pain Scale: 0-10   Pain Score: 2    SpO2: SpO2: 97 % O2 Device:SpO2: 97 % O2 Flow Rate: .O2 Flow Rate (L/min): 4 L/min  IO: Intake/output summary:  Intake/Output Summary (Last 24 hours) at 10/25/2023 0706 Last data filed at 10/24/2023 0941 Gross per 24 hour  Intake 1000 ml  Output --  Net 1000 ml    LBM: Last BM Date : 10/24/23 Baseline Weight: Weight: 117.8 kg Most recent weight: Weight: 117.8 kg     Palliative Assessment/Data: 40 %     Time 75 minutes Signed by: Thena Fireman, NP   Please contact Palliative Medicine Team phone at [phone] for questions and concerns.  For individual provider: See Tilford Foley

## 2023-10-25 NOTE — Consult Note (Signed)
 [facility]    Date of Admission:  10/23/2023     Total days of antibiotics 1   Vancomycin    Diflucan 6/10              Reason for Consult: Bacteremia     Referring Provider: Zelda Hickman Primary Care Provider: Lory Rough., PA-C   Assessment: [name] [name] is a 73 y.o. female admitted from home following fall and prolonged downtime. Found to have fever, leukocytosis and elevated lactate/CK:   Staphylococcus Epidermidis Bacteremia 2/2 -  Streptococcus Mitis/Oralis Bacteremia 1/2 -  Could still be reflective of contaminants from veinipuncture. Has multiple hardware (L TKR, R ORIF Tibia). Leukocytosis down-trending. No further fevers.  Will ask micro team to work up both sets of staph epi.  Continue vancomycin  for treatment  Will order repeat blood cultures   Irritant Dermatitis, Intertriginous Candidiasis -  Maggot Infestation -  Skin break down noted in body folds with erythema and white wet drainage to wounds. Will start diflucan 150 mg daily given multiple areas involved. Interdry Ag ordered for folds.  WOC team following for wound care recommendations. Multiple full thickness areas of skin loss but nothing obviously purulent. Some bleeding. No fluctuance.  Urinary containment with purewick for skin healing.  Vancomycin  I would think is enough to cover wound in addition to the diflucan  Abnormal Chest CT -  Doxycycline started for possible AECOPD. Does have a new cough she describes. No oxygen at present.    Plan: I have asked micro to work up both sets of cultures to better evaluate  Vancomycin  to continue for now I don't feel strongly she needs cefepime Diflucan 150 mg daily for now for severe candidiasis in body folds.  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   COPD (chronic obstructive pulmonary disease) (HCC)    Essential hypertension   Non-insulin  dependent type 2 diabetes mellitus (HCC)   Peripheral neuropathy   Fall at home, initial encounter   Cellulitis all over abdomen and thigh area   Maggot infestation   Acute cystitis   Rhabdomyolysis   History of COPD   Metabolic acidosis   Acute kidney injury superimposed on chronic kidney disease (HCC)   Hyperlipidemia   Transaminitis   Hypothyroidism   Acute hypoxic respiratory failure (HCC)   Acute metabolic encephalopathy    heparin  5,000 Units Subcutaneous Q8H   leptospermum manuka honey  1 Application Topical Daily   levothyroxine   25 mcg Oral Q0600   midodrine  10 mg Oral TID WC   pantoprazole  (PROTONIX ) IV  40 mg Intravenous Q24H   silver sulfADIAZINE   Topical Daily   sodium chloride  flush  3 mL Intravenous Q12H    HPI: [name] is a 73 y.o. female brought to the hospital after fall and prolonged down time at home after EMS was called for welfare check. Found lying on L side in bathroom.   She remembers falling Tuesday at home but not clear what was going on in between those days. Someone tried to call her on Saturday and EMS was called out to do welfare check. She lives in a mobile home that she says is condemned. Holes rotting in the floor and unusable bathroom.  She says that she fell because she was weak due to not taking enough  tylenol  for pain management because she ran out.   On arrival she was hypotensive, dehydrated, multiple wounds on anterior surface of her body from cheek to feet. Leukocytosis and low grade fever prompted blood cultures to be drawn. She has staphylococcus epidermidis growing in both sets and a strep mitis/oralis growing in one set. There was an area of more purulent described drainage to the lower abdominal fold. CK elevated. CT scan of chest with possible atypical ground glass opacities in upper lobes - for which she was started on doxycycline for.   H/O left THR - stayed with SNF for a while. Had her  hip redone with IM nail included in that revision. ORIF R Tibia Jan 2025 following fall.   Has a h/o heart murmur that was previously worked up with her PCP.   Review of Systems: Review of Systems  Constitutional:  Positive for fever and malaise/fatigue.  Respiratory:  Positive for cough (some new cough at times). Negative for shortness of breath.   Cardiovascular:  Negative for chest pain and leg swelling.  Gastrointestinal:  Negative for abdominal pain, diarrhea and vomiting.  Genitourinary: Negative.   Musculoskeletal:        Diffuse joint and body pain   Skin:  Positive for rash.  Neurological:  Negative for dizziness and headaches.    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   Peripheral neuropathy    Pneumonia    hx of    Shortness of breath    with exertion    Skin yeast infection    history of under bilateral breast,, pannus, inner legs   Sleep apnea    uses bipap- does not know setting s    Social History   Tobacco Use   Smoking status: Former    Current packs/day: 0.00    Types: Cigarettes    Quit date: 01/22/2008    Years since quitting: 15.7   Smokeless tobacco: Never  Vaping Use   Vaping status: Never Used  Substance Use Topics   Alcohol  use: No   Drug use: No    Family History  Problem Relation Age of Onset   Arrhythmia Mother    Heart failure Mother    Diabetes Mellitus II Mother    Leukemia Father    Heart attack Father    Heart disease Father    Heart disease Sister    COPD Sister    Heart disease Brother    Other Sister        Factor 5   Leukemia Sister    Other Brother         "instant death"   Allergies  Allergen Reactions   Coffee Flavoring Agent (Non-Screening) Nausea And Vomiting    Per pt she is "allergic" to the smell of coffee   Prednisone Other (See Comments)    Tightness in chest   Other Other (See Comments)    Plain nystatin  ointment-makes skin get worse Patient is allergic to any petroleum based ointment     OBJECTIVE: Blood  pressure (!) 162/76, pulse 89, temperature (!) 97.5 F (36.4 C), temperature source Oral, resp. rate 15, height 5\' 2"  (1.575 m), weight 117.8 kg, SpO2 94%.  Physical Exam Constitutional:      Appearance: She is ill-appearing.  HENT:     Nose: Nose normal.     Mouth/Throat:     Mouth: Mucous membranes are dry.  Eyes:     Pupils: Pupils are equal, round, and reactive to light.  Cardiovascular:     Rate and Rhythm: Normal rate.  Pulmonary:     Effort: Pulmonary effort is normal. No respiratory distress.  Abdominal:     General: There is no distension.     Tenderness: There is no abdominal tenderness.  Musculoskeletal:     Cervical back: No rigidity or tenderness.  Skin:    General: Skin is warm.     Findings: Erythema and rash present.     Comments: Maceration/irritant dermatitis from fecal / urinary incontinence at home. Intertriginal folds all erythematous with white wet appearance c/w candidial infection. Full thickness skin sloughing to inframammary fold on left.   Neurological:     Mental Status: She is alert.     Lab Results Lab Results  Component Value Date   WBC 14.4 (H) 10/25/2023   HGB 9.8 (L) 10/25/2023   HCT 32.5 (L) 10/25/2023   MCV 90.5 10/25/2023   PLT 295 10/25/2023    Lab Results  Component Value Date   CREATININE 1.10 (H) 10/25/2023   BUN 29 (H) 10/25/2023   NA 145 10/25/2023   K 3.8 10/25/2023   CL 114 (H) 10/25/2023   CO2 19 (L) 10/25/2023    Lab Results  Component Value Date   ALT 41 10/25/2023   AST 79 (H) 10/25/2023   ALKPHOS 37 (L) 10/25/2023   BILITOT 0.7  10/25/2023     Microbiology: Recent Results (from the past 240 hours)  Blood Culture (routine x 2)     Status: Abnormal (Preliminary result)   Collection Time: 10/23/23  9:06 PM   Specimen: BLOOD  Result Value Ref Range Status   Specimen Description BLOOD BLOOD LEFT WRIST  Final   Special Requests   Final    BOTTLES DRAWN AEROBIC AND ANAEROBIC Blood Culture adequate volume   Culture  Setup Time   Final    GRAM POSITIVE COCCI IN BOTH AEROBIC AND ANAEROBIC BOTTLES CRITICAL VALUE NOTED.  VALUE IS CONSISTENT WITH PREVIOUSLY REPORTED AND CALLED VALUE. Performed at [facility], [address], [place], [place] [address]    Culture STAPHYLOCOCCUS EPIDERMIDIS (A)  Final   Report Status PENDING  Incomplete  Blood Culture (routine x 2)     Status: Abnormal (Preliminary result)   Collection Time: 10/23/23  9:07 PM   Specimen: BLOOD RIGHT WRIST  Result Value Ref Range Status   Specimen Description BLOOD RIGHT WRIST  Final   Special Requests   Final    BOTTLES DRAWN AEROBIC AND ANAEROBIC Blood Culture adequate volume   Culture  Setup Time   Final    GRAM POSITIVE COCCI IN CLUSTERS GRAM POSITIVE COCCI IN CHAINS IN BOTH AEROBIC AND ANAEROBIC BOTTLES CRITICAL RESULT CALLED TO, READ BACK BY AND VERIFIED WITH: PHARMD JEREMY FRENS ON 10/24/23 @ 1502 BY DRT    Culture (A)  Final    STAPHYLOCOCCUS EPIDERMIDIS STREPTOCOCCUS MITIS/ORALIS SUSCEPTIBILITIES TO FOLLOW Performed at [facility], [address] [place], [place] [address]    Report Status PENDING  Incomplete  Blood  Culture ID Panel (Reflexed)     Status: Abnormal   Collection Time: 10/23/23  9:07 PM  Result Value Ref Range Status   Enterococcus faecalis NOT DETECTED NOT DETECTED Final   Enterococcus Faecium NOT DETECTED NOT DETECTED Final   Listeria monocytogenes NOT DETECTED NOT DETECTED Final   Staphylococcus species DETECTED (A) NOT DETECTED Final    Comment: CRITICAL RESULT CALLED TO, READ BACK BY AND VERIFIED  WITH: PHARMD JEREMY FRENS ON 10/24/23 @ 1502 BY DRT    Staphylococcus aureus (BCID) NOT DETECTED NOT DETECTED Final   Staphylococcus epidermidis DETECTED (A) NOT DETECTED Final    Comment: Methicillin (oxacillin) resistant coagulase negative staphylococcus. Possible blood culture contaminant (unless isolated from more than one blood culture draw or clinical [place] suggests pathogenicity). No antibiotic treatment is indicated for blood  culture contaminants. CRITICAL RESULT CALLED TO, READ BACK BY AND VERIFIED WITH: PHARMD JEREMY FRENS ON 10/24/23 @ 1502 BY DRT    Staphylococcus lugdunensis NOT DETECTED NOT DETECTED Final   Streptococcus species DETECTED (A) NOT DETECTED Final    Comment: Not Enterococcus species, Streptococcus agalactiae, Streptococcus pyogenes, or Streptococcus pneumoniae. CRITICAL RESULT CALLED TO, READ BACK BY AND VERIFIED WITH: PHARMD JEREMY FRENS ON 10/24/23 @ 1502 BY DRT    Streptococcus agalactiae NOT DETECTED NOT DETECTED Final   Streptococcus pneumoniae NOT DETECTED NOT DETECTED Final   Streptococcus pyogenes NOT DETECTED NOT DETECTED Final   A.calcoaceticus-baumannii NOT DETECTED NOT DETECTED Final   Bacteroides fragilis NOT DETECTED NOT DETECTED Final   Enterobacterales NOT DETECTED NOT DETECTED Final   Enterobacter cloacae complex NOT DETECTED NOT DETECTED Final   Escherichia coli NOT DETECTED NOT DETECTED Final   Klebsiella aerogenes NOT DETECTED NOT DETECTED Final   Klebsiella oxytoca NOT DETECTED NOT DETECTED Final   Klebsiella pneumoniae NOT DETECTED NOT DETECTED Final   Proteus species NOT DETECTED NOT DETECTED Final   Salmonella species NOT DETECTED NOT DETECTED Final   Serratia marcescens NOT DETECTED NOT DETECTED Final   Haemophilus influenzae NOT DETECTED NOT DETECTED Final   Neisseria meningitidis NOT DETECTED NOT DETECTED Final   Pseudomonas aeruginosa NOT DETECTED NOT DETECTED Final   Stenotrophomonas maltophilia NOT DETECTED NOT DETECTED Final    Candida albicans NOT DETECTED NOT DETECTED Final   Candida auris NOT DETECTED NOT DETECTED Final   Candida glabrata NOT DETECTED NOT DETECTED Final   Candida krusei NOT DETECTED NOT DETECTED Final   Candida parapsilosis NOT DETECTED NOT DETECTED Final   Candida tropicalis NOT DETECTED NOT DETECTED Final   Cryptococcus neoformans/gattii NOT DETECTED NOT DETECTED Final   Methicillin resistance mecA/C DETECTED (A) NOT DETECTED Final    Comment: CRITICAL RESULT CALLED TO, READ BACK BY AND VERIFIED WITH: PHARMD JEREMY FRENS ON 10/24/23 @ 1502 BY DRT Performed at [facility], [address] [place], [place] [address]   Urine Culture     Status: None   Collection Time: 10/23/23 11:49 PM   Specimen: Urine, Random  Result Value Ref Range Status   Specimen Description URINE, RANDOM  Final   Special Requests NONE Reflexed from [id number]  Final   Culture   Final    NO GROWTH Performed at [facility], [address] [place], [place] [address]    Report Status 10/24/2023 FINAL  Final  Respiratory (~20 pathogens) panel by PCR     Status: None   Collection Time: 10/24/23  2:02 AM   Specimen: Nasopharyngeal Swab; Respiratory  Result Value Ref Range Status   Adenovirus  NOT DETECTED NOT DETECTED Final   Coronavirus 229E NOT DETECTED NOT DETECTED Final    Comment: (NOTE) The Coronavirus on the Respiratory Panel, DOES NOT test for the novel  Coronavirus (2019 nCoV)    Coronavirus HKU1 NOT DETECTED NOT DETECTED Final   Coronavirus NL63 NOT DETECTED NOT DETECTED Final   Coronavirus OC43 NOT DETECTED NOT DETECTED Final   Metapneumovirus NOT DETECTED NOT DETECTED Final   Rhinovirus / Enterovirus NOT DETECTED NOT DETECTED Final   Influenza A NOT DETECTED NOT DETECTED Final   Influenza B NOT DETECTED NOT DETECTED Final   Parainfluenza Virus 1 NOT DETECTED NOT DETECTED Final   Parainfluenza Virus 2 NOT DETECTED NOT DETECTED Final   Parainfluenza Virus 3 NOT DETECTED NOT DETECTED Final    Parainfluenza Virus 4 NOT DETECTED NOT DETECTED Final   Respiratory Syncytial Virus NOT DETECTED NOT DETECTED Final   Bordetella pertussis NOT DETECTED NOT DETECTED Final   Bordetella Parapertussis NOT DETECTED NOT DETECTED Final   Chlamydophila pneumoniae NOT DETECTED NOT DETECTED Final   Mycoplasma pneumoniae NOT DETECTED NOT DETECTED Final    Comment: Performed at [facility], [address] [place], [place] [address]  MRSA Next Gen by PCR, Nasal     Status: None   Collection Time: 10/24/23  2:02 AM   Specimen: Nasopharyngeal Swab; Nasal Swab  Result Value Ref Range Status   MRSA by PCR Next Gen NOT DETECTED NOT DETECTED Final    Comment: (NOTE) The GeneXpert MRSA Assay (FDA approved for NASAL specimens only), is one component of a comprehensive MRSA colonization surveillance program. It is not intended to diagnose MRSA infection nor to guide or monitor treatment for MRSA infections. Test performance is not FDA approved in patients less than 81 years old. Performed at [facility], [address] [place], [place] [address]     Gibson Kurtz, MSN, NP-C [facility] Pager: [phone]  10/25/2023 11:56 AM

## 2023-10-26 ENCOUNTER — Inpatient Hospital Stay (HOSPITAL_COMMUNITY)

## 2023-10-26 DIAGNOSIS — I350 Nonrheumatic aortic (valve) stenosis: Secondary | ICD-10-CM

## 2023-10-26 DIAGNOSIS — A419 Sepsis, unspecified organism: Secondary | ICD-10-CM | POA: Diagnosis not present

## 2023-10-26 DIAGNOSIS — R6520 Severe sepsis without septic shock: Secondary | ICD-10-CM | POA: Diagnosis not present

## 2023-10-26 LAB — COMPREHENSIVE METABOLIC PANEL WITH GFR
ALT: 48 U/L — ABNORMAL HIGH (ref 0–44)
AST: 77 U/L — ABNORMAL HIGH (ref 15–41)
Albumin: 2.3 g/dL — ABNORMAL LOW (ref 3.5–5.0)
Alkaline Phosphatase: 52 U/L (ref 38–126)
Anion gap: 6 (ref 5–15)
BUN: 25 mg/dL — ABNORMAL HIGH (ref 8–23)
CO2: 23 mmol/L (ref 22–32)
Calcium: 8.6 mg/dL — ABNORMAL LOW (ref 8.9–10.3)
Chloride: 113 mmol/L — ABNORMAL HIGH (ref 98–111)
Creatinine, Ser: 1.1 mg/dL — ABNORMAL HIGH (ref 0.44–1.00)
GFR, Estimated: 53 mL/min — ABNORMAL LOW (ref >60)
Glucose, Bld: 113 mg/dL — ABNORMAL HIGH (ref 70–99)
Potassium: 3.7 mmol/L (ref 3.5–5.1)
Sodium: 142 mmol/L (ref 135–145)
Total Bilirubin: 0.7 mg/dL (ref 0.0–1.2)
Total Protein: 5.5 g/dL — ABNORMAL LOW (ref 6.5–8.1)

## 2023-10-26 LAB — ECHOCARDIOGRAM COMPLETE
AR max vel: 0.78 cm2
AV Area VTI: 0.79 cm2
AV Area mean vel: 0.75 cm2
AV Mean grad: 21.5 mmHg
AV Peak grad: 39.3 mmHg
Ao pk vel: 3.13 m/s
Area-P 1/2: 3.72 cm2
Height: 62 in
MV M vel: 3.63 m/s
MV Peak grad: 52.7 mmHg
S' Lateral: 1.6 cm
Weight: 4155.23 [oz_av]

## 2023-10-26 LAB — C-REACTIVE PROTEIN: CRP: 11 mg/dL — ABNORMAL HIGH (ref <1.0)

## 2023-10-26 LAB — GLUCOSE, CAPILLARY
Glucose-Capillary: 97 mg/dL (ref 70–99)
Glucose-Capillary: 134 mg/dL — ABNORMAL HIGH (ref 70–99)
Glucose-Capillary: 99 mg/dL (ref 70–99)
Glucose-Capillary: 102 mg/dL — ABNORMAL HIGH (ref 70–99)
Glucose-Capillary: 162 mg/dL — ABNORMAL HIGH (ref 70–99)

## 2023-10-26 LAB — CBC WITH DIFFERENTIAL/PLATELET
Abs Immature Granulocytes: 0.35 10*3/uL — ABNORMAL HIGH (ref 0.00–0.07)
Basophils Absolute: 0.1 10*3/uL (ref 0.0–0.1)
Basophils Relative: 1 %
Eosinophils Absolute: 1 10*3/uL — ABNORMAL HIGH (ref 0.0–0.5)
Eosinophils Relative: 8 %
HCT: 32.4 % — ABNORMAL LOW (ref 36.0–46.0)
Hemoglobin: 9.9 g/dL — ABNORMAL LOW (ref 12.0–15.0)
Immature Granulocytes: 3 %
Lymphocytes Relative: 15 %
Lymphs Abs: 1.7 10*3/uL (ref 0.7–4.0)
MCH: 27.3 pg (ref 26.0–34.0)
MCHC: 30.6 g/dL (ref 30.0–36.0)
MCV: 89.3 fL (ref 80.0–100.0)
Monocytes Absolute: 0.6 10*3/uL (ref 0.1–1.0)
Monocytes Relative: 5 %
Neutro Abs: 7.9 10*3/uL — ABNORMAL HIGH (ref 1.7–7.7)
Neutrophils Relative %: 68 %
Platelets: 286 10*3/uL (ref 150–400)
RBC: 3.63 MIL/uL — ABNORMAL LOW (ref 3.87–5.11)
RDW: 14 % (ref 11.5–15.5)
WBC: 11.6 10*3/uL — ABNORMAL HIGH (ref 4.0–10.5)
nRBC: 0 % (ref 0.0–0.2)

## 2023-10-26 LAB — CULTURE, BLOOD (ROUTINE X 2): Special Requests: ADEQUATE

## 2023-10-26 LAB — PHOSPHORUS: Phosphorus: 3.4 mg/dL (ref 2.5–4.6)

## 2023-10-26 LAB — CK: Total CK: 968 U/L — ABNORMAL HIGH (ref 38–234)

## 2023-10-26 LAB — PROCALCITONIN: Procalcitonin: 0.37 ng/mL

## 2023-10-26 LAB — HEMOGLOBIN A1C
Hgb A1c MFr Bld: 5.9 % — ABNORMAL HIGH (ref 4.8–5.6)
Mean Plasma Glucose: 122.63 mg/dL

## 2023-10-26 LAB — BRAIN NATRIURETIC PEPTIDE: B Natriuretic Peptide: 653.6 pg/mL — ABNORMAL HIGH (ref 0.0–100.0)

## 2023-10-26 LAB — MAGNESIUM: Magnesium: 1.5 mg/dL — ABNORMAL LOW (ref 1.7–2.4)

## 2023-10-26 LAB — AMMONIA: Ammonia: 15 umol/L (ref 9–35)

## 2023-10-26 MED ORDER — GABAPENTIN 300 MG PO CAPS
600.0000 mg | ORAL_CAPSULE | Freq: Every day | ORAL | Status: DC
  Administered 2023-10-26 – 2023-10-30 (×5): 600 mg via ORAL
  Filled 2023-10-26 (×5): qty 2

## 2023-10-26 MED ORDER — POTASSIUM CHLORIDE CRYS ER 20 MEQ PO TBCR
40.0000 meq | EXTENDED_RELEASE_TABLET | Freq: Four times a day (QID) | ORAL | Status: AC
  Administered 2023-10-26 (×2): 40 meq via ORAL
  Filled 2023-10-26 (×2): qty 2

## 2023-10-26 MED ORDER — HYDRALAZINE HCL 20 MG/ML IJ SOLN
5.0000 mg | INTRAMUSCULAR | Status: DC | PRN
  Administered 2023-10-26: 5 mg via INTRAVENOUS
  Filled 2023-10-26: qty 1

## 2023-10-26 MED ORDER — MAGNESIUM SULFATE 2 GM/50ML IV SOLN
2.0000 g | Freq: Once | INTRAVENOUS | Status: AC
  Administered 2023-10-26: 2 g via INTRAVENOUS
  Filled 2023-10-26: qty 50

## 2023-10-26 MED ORDER — VITAMIN D 25 MCG (1000 UNIT) PO TABS
2000.0000 [IU] | ORAL_TABLET | Freq: Every day | ORAL | Status: DC
  Administered 2023-10-26 – 2023-10-31 (×6): 2000 [IU] via ORAL
  Filled 2023-10-26 (×6): qty 2

## 2023-10-26 MED ORDER — VANCOMYCIN HCL IN DEXTROSE 1-5 GM/200ML-% IV SOLN
1000.0000 mg | INTRAVENOUS | Status: DC
  Administered 2023-10-26: 1000 mg via INTRAVENOUS
  Filled 2023-10-26: qty 200

## 2023-10-26 NOTE — Evaluation (Signed)
 Occupational Therapy Evaluation Patient Details Name: [name] [name] MRN: [id number] DOB: 01/29/1951 Today's Date: 10/26/2023   History of Present Illness   Patient is 73 y.o. female who presented on 6/8 after being found down during a welfare check. Pt admitted to [facility] with AMS, sepsis from CAP, and cellulitis with maggot infestation. Pt with PMH significant for hiatal hernia, small distal ring, tertiary contractions seen on 2017 UGI study, bipolar disorder, hoarding, COPD, HTN, HLD, DM, hx of femoral neck fx with malunion(stable per ortho on prior admission),GERD, OA, anemia, anxiety/depression.     Clinical Impressions Pt c/o pain all over, 9/10, mainly BLEs. Pt lethargic, but able to answer questions and follow commands, not oriented to month/day. Pt lives alone, PLOF mod I with w/c and RW, uses BSC, has mechanical bed. Pt currently at bed level, total A for dressing/bathing, bed mobility, declined sitting EOB due to pain and lethargy, barely able to lift BUEs, weak grip, able to grab cup and take sips but poor overall ability to complete ADLs without significant assistance. Recommending postacute rehab <3hrs/day to improve with ADLs and transfers to return to PLOF, will continue to follow acutely.      If plan is discharge home, recommend the following:   Two people to help with walking and/or transfers;Two people to help with bathing/dressing/bathroom;Assistance with cooking/housework;Assist for transportation;Help with stairs or ramp for entrance     Functional Status Assessment   Patient has had a recent decline in their functional status and demonstrates the ability to make significant improvements in function in a reasonable and predictable amount of time.     Equipment Recommendations   Other (comment) (defer)     Recommendations for Other Services         Precautions/Restrictions   Precautions Precautions: Fall Recall of Precautions/Restrictions:  Intact Restrictions Weight Bearing Restrictions Per Provider Order: No     Mobility Bed Mobility Overal bed mobility: Needs Assistance Bed Mobility: Rolling Rolling: Total assist         General bed mobility comments: total A x2    Transfers                   General transfer comment: unable      Balance Overall balance assessment: Needs assistance     Sitting balance - Comments: declined due to lethargy, total for bed mobility       Standing balance comment: not tested                           ADL either performed or assessed with clinical judgement   ADL Overall ADL's : Needs assistance/impaired Eating/Feeding: Minimal assistance;Bed level   Grooming: Maximal assistance;Bed level   Upper Body Bathing: Total assistance;Bed level   Lower Body Bathing: Total assistance;Bed level   Upper Body Dressing : Total assistance;Bed level   Lower Body Dressing: Total assistance;Bed level       Toileting- Clothing Manipulation and Hygiene: Total assistance;Bed level         General ADL Comments: bed level, total A for dressing/bathing, min-max for grooming/feeding, Pt barely able to lift BLEs, weak grip, was able to hold a cup and sip with RUE.     Vision Baseline Vision/History: 1 Wears glasses Ability to See in Adequate Light: 1 Impaired Patient Visual Report: No change from baseline       Perception         Praxis  Pertinent Vitals/Pain Pain Assessment Pain Assessment: 0-10 Pain Score: 9  Pain Location: all oer, BLEs mainly Pain Descriptors / Indicators: Aching, Constant Pain Intervention(s): Limited activity within patient's tolerance, Monitored during session     Extremity/Trunk Assessment             Communication Communication Communication: No apparent difficulties   Cognition Arousal: Lethargic Behavior During Therapy: WFL for tasks assessed/performed Cognition: No family/caregiver present to determine  baseline             OT - Cognition Comments: Lethargic, not able to state what month or day it is, knows it's 2025. able to anwer questions about PLOF                 Following commands: Intact       Cueing  General Comments   Cueing Techniques: Verbal cues      Exercises     Shoulder Instructions      Home Living Family/patient expects to be discharged to:: Private residence Living Arrangements: Alone Available Help at Discharge: Family Type of Home: Mobile home Home Access: Ramped entrance     Home Layout: One level     Bathroom Shower/Tub: Tub/shower unit         Home Equipment: Wheelchair - Forensic psychologist (2 wheels);BSC/3in1;Other (comment) (mechanical bed)   Additional Comments: entire home is condemed per pt., has mechanical bed      Prior Functioning/Environment Prior Level of Function : Independent/Modified Independent             Mobility Comments: use WC and uses bil UE or bil LE., uses RW to get into bathroom ADLs Comments: cannot get into bathroom. uses BSC and then dumps the bucket in the toilet. to shower sits on bench and washes in tub. last time. pt states attempted 1 week ago but just uses wash cloths.    OT Problem List: Decreased strength;Decreased range of motion;Decreased activity tolerance;Impaired balance (sitting and/or standing);Decreased cognition;Impaired UE functional use;Pain;Obesity   OT Treatment/Interventions: Self-care/ADL training;Therapeutic exercise;Energy conservation;DME and/or AE instruction;Therapeutic activities;Patient/family education;Balance training      OT Goals(Current goals can be found in the care plan section)   Acute Rehab OT Goals Patient Stated Goal: to manage pain OT Goal Formulation: With patient Time For Goal Achievement: 11/09/23 Potential to Achieve Goals: Good   OT Frequency:  Min 2X/week    Co-evaluation              AM-PAC OT 6 Clicks Daily Activity      Outcome Measure Help from another person eating meals?: A Little Help from another person taking care of personal grooming?: A Lot Help from another person toileting, which includes using toliet, bedpan, or urinal?: Total Help from another person bathing (including washing, rinsing, drying)?: Total Help from another person to put on and taking off regular upper body clothing?: Total Help from another person to put on and taking off regular lower body clothing?: Total 6 Click Score: 9   End of Session Nurse Communication: Mobility status  Activity Tolerance: Patient limited by pain;Patient limited by lethargy Patient left: in bed;with call bell/phone within reach;with bed alarm set  OT Visit Diagnosis: Unsteadiness on feet (R26.81);Other abnormalities of gait and mobility (R26.89);Muscle weakness (generalized) (M62.81);Other symptoms and signs involving cognitive function;Pain                Time: 4098-1191 OT Time Calculation (min): 20 min Charges:  OT General Charges $OT Visit: 1 Visit OT Evaluation $OT  Eval Moderate Complexity: 1 [address] OTR/L   Scherry Curtis 10/26/2023, 11:45 AM

## 2023-10-26 NOTE — Plan of Care (Signed)

## 2023-10-26 NOTE — Progress Notes (Signed)
 Speech Language Pathology Treatment: Dysphagia  Patient Details Name: [name] [name] MRN: [id number] DOB: [birth year] Today's Date: 10/26/2023 Time: 1040-1059 SLP Time Calculation (min) (ACUTE ONLY): 19 min  Assessment / Plan / Recommendation Clinical Impression  Patient seen for skilled SLP to address dysphagia goals including reviewing her MBS study from yesterday and reinforcement of effective compensation strategies. Patient today reports that she has learned to not use straws as she states that this causes her to cough more frequently. Reviewed MBS fluoroscopy loops with her showing aspiration with nonclearing cough response with large sequential swallows. Also reviewed that small single boluses were tolerated well without aspiration or penetration. In discussion with patient, she admits that she will propel food or liquids at times from her upper throat into her mouth and reswallow even prior to admission. She states this has been ongoing for years but she does confirm worsening dysphagia/coughing with this event. Using teach back to her of importance to maintain strength of expectoration to expel vallecular retention if she is unable to swallow to clear. Cued expectoration was not effective for secretions causing SLP to suspect reflexive gagging is causing vallecular retention propelling into oral pharynx. Patient endorses coughing of the following secretions following her modified barium swallow test yesterday which SLP suspects is due to known history of esophageal tertiary contractions and hiatal hernia. Therefore advised patient consuming small frequent meals and save drinks and foods that can stay at room temperature to alott snacks in between her meals. Patient able to verbalize 3 recommendations and findings regarding her swallowing at the end of the session independently without delay. [name] demonstrates excellent understanding of her swallowing function.     HPI HPI: 73 yo female adm to  [facility] with AMS, sepsis - Concern for cellulitis and pna.  She was found covered in feces and maggots.  Pt with PMH + for hiatal hernia, small distal ring, tertiary contractions seen on 2017 UGI study. Pt was in prep for bariatric surgery.  PMH also + for potential bipolar, hoarding, picking at sores and lives alone in a mobile home.  Swallow eval ordered due to pt not passing swallow screen.  Pt admits to issues with dysphagia causing her to cough and sensing food retention in distal pharynx causing her to propel into mouth and reswallow.      SLP Plan  Continue with current plan of care          Recommendations  Diet recommendations: Regular;Thin liquid Liquids provided via: Cup;Straw Medication Administration: Other (Comment) (Try with pudding or applesauce, start and follow with liquid) Supervision: Patient able to self feed;Intermittent supervision to cue for compensatory strategies Compensations: Slow rate;Small sips/bites;Other (Comment) (Small frequent meals as able) Postural Changes and/or Swallow Maneuvers: Seated upright 90 degrees;Upright 30-60 min after meal                  Oral care BID     Dysphagia, oropharyngeal phase (R13.12)     Continue with current plan of care    Maudie Sorrow, MS [facility] SLP Acute Rehab Services Office [phone]  Chantal Comment  10/26/2023, 12:26 PM

## 2023-10-26 NOTE — Progress Notes (Signed)
 ID PROGRESS NOTE   Skin exam appears improving. Patient having less pain. No new cultures results.  Continue on current antimicrobials.  Gerold Kos Levern Reader MD MPH [facility] [phone]

## 2023-10-26 NOTE — TOC Progression Note (Signed)
 Transition of Care [facility]) - Progression Note    Patient Details  Name: [name] [name] MRN: [id number] Date of Birth: 26-Aug-1950  [facility] CM/SW Contact  [name], LCSW Phone Number: 10/26/2023, 9:09 AM  Clinical Narrative:    CSW received consult for possible SNF placement at time of discharge. CSW spoke with patient's HCPOA, [name] (she will bring in the paperwork for the hospital records and patient's sister, Joann is listed as her backup).  [name] is an Charity fundraiser and reported that she has known patient over 30 years. She shared that APS attempted to convince patient for long term care placement last year but that patient refused. She reported that patient was independent in her mobile home with a walker and wheelchair until recently. Patient has a Life Alert but refuses to wear it. When she fell, her phone broke so was unable to call for help. Her neighbor is supposed to check on her daily but did not so when [name] was unable to get her on the phone patient's brother when out to investigate. [name] has spoken with patient about SNF rehab and patient was agreeable to SNF placement at time of discharge. [name] does not want patient to return to [facility]. CSW discussed insurance authorization process and will provide Medicare SNF ratings list. CSW will send out referrals for review and provide bed offers as available. CSW also discussed [name] applying for Medicaid if she felt patient would eventually agree to ALF placement.     Skilled Nursing Rehab Facilities-   [email or web address]   Ratings out of 5 stars (5 the highest)  Name Address  Phone # Quality Care Staffing Health Inspection Overall  [facility] [address] [phone] 3 1 4 3   [facility] [address] [place] [phone] 5 2 4 5   [facility] [address], [facility] [phone] [facility] [address] [place]  [phone] 4 3 4 4   [facility]  [address], [place] [phone] 5 3 5 5   [facility] [address] [facility] [phone] 5 3 2 3   [facility] [address] [place] [phone] 5 1 2 2   [facility] [address], [place] [phone] 2 3 3 3   [address] ([place]) [address] [place] [phone] 1 3 3 2   [facility] [address] [place], [place] [phone] 3 1 2 1   [facility] ([place]) [address], [place] [phone] 3 1 1 1   [facility] [address] [place] [phone] 3 3 4 4   [facility] [address] [place] [phone] 4 4 3 3   [facility] ([facility]) [address], [place] [phone] 1 1 4 2           [facility] [address] [place] [phone] 3 1 5 4   [facility] [address], [place] [phone] 4 1 1 1   [facility]  [address] [place] [phone] 2 3 2 2   [facility] [address] [phone] 2 2 4 4   [facility] [address], [place] [phone] 2 2 3 3           [facility] [address], [place] [phone] 2 1 2 1   [facility] (No [facility]) [address] [place] [phone] 5 4 5 5   [facility] [address] [place] [phone] 3 4 2  2  [facility] [address] [place] [phone] 3 1 2 1   [facility] [address] [place] [phone] 3 2 2 2   [facility] [address] [place] [phone] 1 3 3 2   [facility] [address] [place] [phone] 2 2 3 3   [facility] [address] [place], [place] [phone] 2 1 4 3   [facility] [address], [facility] [phone] 2 1 1 1   [facility] [address] [place], [place] [phone] 2 1 2 1   [facility] [address] [place] [phone] 3 1 2  1           [facility] [address], [place] [phone] 4 1 2 1   [facility] [address] [place]  [phone] 3 4 4 4   [facility] (No [employer]) [address], [place] [phone] 3 2 4 4   [facility]) [address], [place] [phone] 2 2 3 3   [facility] [address] [place] [phone] 5 3 5 5   [facility] [address], [place] [phone] 2 1 1 1   [facility] [address] [place], [place] [phone] 3 5 5 5           [facility] [address] [place], [place] [phone] 5 4 5 5   [facility])  [address] [place] [phone] 1 1 2 1   [facility]) [address], [place] [phone]  2 4 4   [facility] [address], [place] [phone] 3 5 5 5   [name] [place], [place] [phone] 4 2 2 2   [facility]) [address] [place] [phone] 1 1 3 1       Expected Discharge Plan: Skilled Nursing Facility Barriers to Discharge: Continued Medical Work up, English as a second language teacher, SNF Pending bed offer  Expected Discharge Plan and Services In-house Referral: Clinical Social Work   Post Acute Care Choice: Skilled Nursing Facility Living arrangements for the past 2 months: Mobile Home                                       Social Determinants of Health (SDOH) Interventions SDOH Screenings   Food Insecurity: Food Insecurity Present (10/25/2023)  Housing: High Risk (10/25/2023)  Transportation Needs: Unmet Transportation Needs (10/25/2023)  Utilities: At Risk (10/25/2023)  Social Connections: Socially Isolated (10/25/2023)  Tobacco Use: Medium Risk (10/24/2023)    Readmission Risk Interventions    06/01/2022   12:03 PM  Readmission Risk Prevention Plan  Post Dischage Appt Complete  Medication Screening Complete  Transportation Screening Complete

## 2023-10-26 NOTE — Progress Notes (Signed)
*  PRELIMINARY RESULTS* Echocardiogram 2D Echocardiogram has been performed.  [name] [name] 10/26/2023, 9:11 AM

## 2023-10-26 NOTE — Progress Notes (Signed)
 PROGRESS NOTE                                                                                                                                                                                                             Patient Demographics:    [name] [name], is a 73 y.o. female, DOB - 02/12/1951, RUE:[id number]  Outpatient Primary MD for the patient is Lory Rough., PA-C    LOS - 2  Admit date - 10/23/2023    Chief Complaint  Patient presents with   Code Sepsis       Brief Narrative (HPI from H&P)     73 y.o. female with medical history significant of COPD, essential hypertension, hyperlipidemia, DM type II, peripheral neuropathy, chronic right femoral fracture, right proximal tibial fracture status post ORIF, chronic constipation, chronic depressive disorder, bipolar disorder, hypothyroidism, hoarding behavior, vitamin D  deficiency, and morbid obesity presented emergency department via EMS as being called out for a welfare check.  EMS found the patient on the ground in her own urine and feces with maggots crawling on her seen by EMS.  Unknown downtime and fall.  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   Subjective:    [name] [name] today with no significant events overnight as discussed with staff, she denies any complaints today.  Reports some headache overnight, but has resolved.   Assessment  & Plan :    Severe sepsis POA CAP Bacteremia Pressure ulcer with surrounding cellulitis. - Severe sepsis present on admission. - Sepsis related to pneumonia, and generalized cellulitis infested with Magots. - As well blood culture and positive for staph, multiple species. - ID input greatly appreciated, continue with IV vancomycin .  Acute hypoxic respiratory failure -Was felt secondary to pneumonia, encouraged use incentive spirometry and flutter valve..   Maggot infestation -Patient found to be buried in  Maggots all over her body.  It has been cleaned while in the ED.   Acute kidney injury superimposed CKD stage IIIb Rhabdomyolysis -AKI most likely in the setting of dehydration, and rhabdomyolysis from being on the floor for couple days. -Improving with IV fluids, continue to monitor total CK.  Acute metabolic encephalopathy-due to dehydration and sepsis. - CT head and C-spine nonacute, no headache or focal deficits, mentation improving, continue supportive care, minimize narcotics and benzodiazepines, stable TSH,  B12, RPR and ammonia levels.    Mild transaminitis Hyperbilirubinemia -Transaminitis and hyperbilirubinemia in the setting of dehydration and rhabdomyolysis.  No right upper quadrant tenderness, negative acute hepatitis panel trend is improving.  Monitor.     History of femoral neck fracture Chronic pain syndrome Supportive care, CT noted, discussed with orthopedics PA Umberto Ganong, CT finding of loose hardware is chronic.  Nothing to be done.   Hypophosphatemia Hypomagnesemia - replaced  Anemia of chronic disease -Stable H&H.   Essential hypertension -Hold blood pressure medication in the setting of sepsis and hypotension.   Generalized anxiety disorder Major depressive disorder Bipolar disorder -Will resume home benzos now mentation has improved   Hypothyroidism -Continue  levothyroxine    Peripheral neuropathy -Mentation has improved resume home gabapentin    History of COPD  Acute hypoxic respiratory failure in setting of pneumonia -Continue  supplemental oxygen and check pulse ox. -Continue DuoNeb as needed.  Non-insulin -dependent DM type II currently has hypoglycemia likely due to prolonged starvation and downtime -Continue check POC blood glucose in the setting of altered mental status and NPO.[name]  Holding metformin , TSH stable, check random cortisol, D5 drip for now  Lab Results  Component Value Date   HGBA1C 5.9 (H) 10/25/2023   CBG (last 3)   Recent Labs    10/25/23 1608 10/25/23 2145 10/26/23 0748  GLUCAP 105* 102* 97    Pressure ulcers: - Is from being on the floor for extended period of time -Continue with wound care Pressure Injury 10/24/23 Face Left Deep Tissue Pressure Injury - Purple or maroon localized area of discolored intact skin or blood-filled blister due to damage of underlying soft tissue from pressure and/or shear. (Active)  10/24/23   Location: Face  Location Orientation: Left  Staging: Deep Tissue Pressure Injury - Purple or maroon localized area of discolored intact skin or blood-filled blister due to damage of underlying soft tissue from pressure and/or shear.  Wound Description (Comments):   Present on Admission: Yes     Pressure Injury 10/24/23 Breast Left Unstageable - Full thickness tissue loss in which the base of the injury is covered by slough (yellow, tan, gray, green or brown) and/or eschar (tan, brown or black) in the wound bed. (Active)  10/24/23   Location: Breast  Location Orientation: Left  Staging: Unstageable - Full thickness tissue loss in which the base of the injury is covered by slough (yellow, tan, gray, green or brown) and/or eschar (tan, brown or black) in the wound bed.  Wound Description (Comments):   Present on Admission: Yes     Pressure Injury 10/24/23 Abdomen Lower;Left Unstageable - Full thickness tissue loss in which the base of the injury is covered by slough (yellow, tan, gray, green or brown) and/or eschar (tan, brown or black) in the wound bed. (Active)  10/24/23   Location: Abdomen  Location Orientation: Lower;Left  Staging: Unstageable - Full thickness tissue loss in which the base of the injury is covered by slough (yellow, tan, gray, green or brown) and/or eschar (tan, brown or black) in the wound bed.  Wound Description (Comments):   Present on Admission: Yes     Pressure Injury 10/24/23 Knee Right;Left Deep Tissue Pressure Injury - Purple or maroon localized  area of discolored intact skin or blood-filled blister due to damage of underlying soft tissue from pressure and/or shear. (Active)  10/24/23   Location: Knee  Location Orientation: Right;Left  Staging: Deep Tissue Pressure Injury - Purple or maroon localized area of discolored intact  skin or blood-filled blister due to damage of underlying soft tissue from pressure and/or shear.  Wound Description (Comments):   Present on Admission: Yes     Pressure Injury 10/24/23 Toe (Comment  which one) Right;Left;Anterior Deep Tissue Pressure Injury - Purple or maroon localized area of discolored intact skin or blood-filled blister due to damage of underlying soft tissue from pressure and/or shear. (Active)  10/24/23   Location: Toe (Comment  which one)  Location Orientation: Right;Left;Anterior  Staging: Deep Tissue Pressure Injury - Purple or maroon localized area of discolored intact skin or blood-filled blister due to damage of underlying soft tissue from pressure and/or shear.  Wound Description (Comments):   Present on Admission: Yes          Condition - Extremely Guarded  Family Communication  : None present  Code Status : DNR  Consults  : None  PUD Prophylaxis : PPI   Procedures  :     CT chest abdomen and pelvis- 1. Geographic ground-glass opacities and interlobular septal thickening in the upper lobes. Distribution favors atypical infection. Edema not excluded. Small right and trace left pleural effusions. 2. Distended gallbladder with large gallstones in the gallbladder neck. No definite gallbladder wall thickening or pericholecystic fluid noting limitations of motion. If there is concern for acute cholecystitis, right upper quadrant ultrasound is recommended. 3. Loosening about the right femur hardware. 4. Aortic Atherosclerosis   CT head C-spine.  Nonacute.      Disposition Plan  :    Status is: Inpatient   DVT Prophylaxis  :    heparin injection 5,000 Units Start:  10/24/23 0600 SCDs Start: 10/24/23 0056 Place TED hose Start: 10/24/23 0056     Lab Results  Component Value Date   PLT 286 10/26/2023    Diet :  Diet Order             Diet regular Room service appropriate? Yes with Assist; Fluid consistency: Thin  Diet effective now                    Inpatient Medications  Scheduled Meds:  fluconazole  150 mg Oral Daily   heparin  5,000 Units Subcutaneous Q8H   insulin  aspart  0-6 Units Subcutaneous TID WC   leptospermum manuka honey  1 Application Topical Daily   levothyroxine   25 mcg Oral Q0600   midodrine  5 mg Oral TID WC   pantoprazole  (PROTONIX ) IV  40 mg Intravenous Q24H   silver sulfADIAZINE   Topical Daily   sodium chloride  flush  3 mL Intravenous Q12H   Continuous Infusions:  doxycycline (VIBRAMYCIN) IV 100 mg (10/25/23 2351)   lactated ringers  75 mL/hr at 10/26/23 1005   vancomycin  1,000 mg (10/26/23 0959)   PRN Meds:.clorazepate, dextrose , ipratropium-albuterol , ketorolac, ondansetron  **OR** ondansetron  (ZOFRAN ) IV, senna-docusate    Objective:   Vitals:   10/25/23 1940 10/25/23 2334 10/26/23 0408 10/26/23 0730  BP: (!) 151/88 132/72 (!) 154/85 (!) 149/73  Pulse: 85 91 83 82  Resp: (!) 21 (!) 23 18 (!) 22  Temp: 97.6 F (36.4 C) 97.8 F (36.6 C) 98.3 F (36.8 C) (!) 97.4 F (36.3 C)  TempSrc: Oral Oral Oral Oral  SpO2: 94% 92% 94% 96%  Weight:      Height:        Wt Readings from Last 3 Encounters:  10/23/23 117.8 kg  07/09/22 121.6 kg  05/30/22 (!) 136.1 kg     Intake/Output Summary (Last 24 hours)  at 10/26/2023 1038 Last data filed at 10/25/2023 2000 Gross per 24 hour  Intake 900 ml  Output --  Net 900 ml     Physical Exam   Awake Alert, deconditioned Symmetrical Chest wall movement, Good air movement bilaterally, CTAB RRR,No Gallops,Rubs or new Murmurs, No Parasternal Heave +ve B.Sounds, Abd Soft, No tenderness, No rebound - guarding or rigidity. No Cyanosis, Clubbing or  edema Multiple pressure ulcers in multiple parts of the body including left face, please see picture below.    RN pressure injury documentation: Pressure Injury 10/24/23 Face Left Deep Tissue Pressure Injury - Purple or maroon localized area of discolored intact skin or blood-filled blister due to damage of underlying soft tissue from pressure and/or shear. (Active)  10/24/23   Location: Face  Location Orientation: Left  Staging: Deep Tissue Pressure Injury - Purple or maroon localized area of discolored intact skin or blood-filled blister due to damage of underlying soft tissue from pressure and/or shear.  Wound Description (Comments):   Present on Admission: Yes  Dressing Type Foam - Lift dressing to assess site every shift;Honey 10/25/23 0800     Pressure Injury 10/24/23 Breast Left Unstageable - Full thickness tissue loss in which the base of the injury is covered by slough (yellow, tan, gray, green or brown) and/or eschar (tan, brown or black) in the wound bed. (Active)  10/24/23   Location: Breast  Location Orientation: Left  Staging: Unstageable - Full thickness tissue loss in which the base of the injury is covered by slough (yellow, tan, gray, green or brown) and/or eschar (tan, brown or black) in the wound bed.  Wound Description (Comments):   Present on Admission: Yes  Dressing Type Foam - Lift dressing to assess site every shift;Honey 10/25/23 0800     Pressure Injury 10/24/23 Abdomen Lower;Left Unstageable - Full thickness tissue loss in which the base of the injury is covered by slough (yellow, tan, gray, green or brown) and/or eschar (tan, brown or black) in the wound bed. (Active)  10/24/23   Location: Abdomen  Location Orientation: Lower;Left  Staging: Unstageable - Full thickness tissue loss in which the base of the injury is covered by slough (yellow, tan, gray, green or brown) and/or eschar (tan, brown or black) in the wound bed.  Wound Description (Comments):    Present on Admission: Yes  Dressing Type Foam - Lift dressing to assess site every shift 10/25/23 0800     Pressure Injury 10/24/23 Knee Right;Left Deep Tissue Pressure Injury - Purple or maroon localized area of discolored intact skin or blood-filled blister due to damage of underlying soft tissue from pressure and/or shear. (Active)  10/24/23   Location: Knee  Location Orientation: Right;Left  Staging: Deep Tissue Pressure Injury - Purple or maroon localized area of discolored intact skin or blood-filled blister due to damage of underlying soft tissue from pressure and/or shear.  Wound Description (Comments):   Present on Admission: Yes  Dressing Type Foam - Lift dressing to assess site every shift 10/25/23 0800     Pressure Injury 10/24/23 Toe (Comment  which one) Right;Left;Anterior Deep Tissue Pressure Injury - Purple or maroon localized area of discolored intact skin or blood-filled blister due to damage of underlying soft tissue from pressure and/or shear. (Active)  10/24/23   Location: Toe (Comment  which one)  Location Orientation: Right;Left;Anterior  Staging: Deep Tissue Pressure Injury - Purple or maroon localized area of discolored intact skin or blood-filled blister due to damage of underlying soft tissue  from pressure and/or shear.  Wound Description (Comments):   Present on Admission: Yes  Dressing Type Foam - Lift dressing to assess site every shift 10/25/23 0800     Pressure Injury 10/24/23 Hand Left Deep Tissue Pressure Injury - Purple or maroon localized area of discolored intact skin or blood-filled blister due to damage of underlying soft tissue from pressure and/or shear. (Active)  10/24/23   Location: Hand  Location Orientation: Left  Staging: Deep Tissue Pressure Injury - Purple or maroon localized area of discolored intact skin or blood-filled blister due to damage of underlying soft tissue from pressure and/or shear.  Wound Description (Comments):   Present on  Admission: Yes      Data Review:    Recent Labs  Lab 10/23/23 2107 10/24/23 0202 10/25/23 0524 10/26/23 0613  WBC 26.7* 22.0* 14.4* 11.6*  HGB 12.8 11.2* 9.8* 9.9*  HCT 40.9 36.5 32.5* 32.4*  PLT 471* 388 295 286  MCV 88.3 90.8 90.5 89.3  MCH 27.6 27.9 27.3 27.3  MCHC 31.3 30.7 30.2 30.6  RDW 13.6 13.7 13.9 14.0  LYMPHSABS 1.2  --  1.5 1.7  MONOABS 1.9*  --  0.7 0.6  EOSABS 0.0  --  0.9* 1.0*  BASOSABS 0.1  --  0.0 0.1    Recent Labs  Lab 10/23/23 2107 10/23/23 2113 10/23/23 2321 10/24/23 0202 10/24/23 0700 10/24/23 0954 10/24/23 1948 10/25/23 0524 10/25/23 0536 10/25/23 2012 10/26/23 0613  NA 141  --   --  143  --   --   --  145  --   --  142  K 4.2  --   --  4.0  --   --   --  3.8  --   --  3.7  CL 109  --   --  112*  --   --   --  114*  --   --  113*  CO2 17*  --   --  19*  --   --   --  19*  --   --  23  ANIONGAP 15  --   --  12  --   --   --  12  --   --  6  GLUCOSE 144*  --   --  139*  --   --   --  93  --   --  113*  BUN 39*  --   --  37*  --   --   --  29*  --   --  25*  CREATININE 1.87*  --   --  1.59*  --   --   --  1.10*  --   --  1.10*  AST 129*  --   --  104*  --   --   --  79*  --   --  77*  ALT 48*  --   --  44  --   --   --  41  --   --  48*  ALKPHOS 61  --   --  47  --   --   --  37*  --   --  52  BILITOT 1.3*  --   --  1.0  --   --   --  0.7  --   --  0.7  ALBUMIN  2.2*  --   --  1.9*  --   --   --  2.4*  --   --  2.3*  CRP  --   --   --   --   --   --   --   --  15.5*  --  11.0*  DDIMER 8.69*  --   --   --   --   --   --   --   --   --   --   PROCALCITON  --   --   --  1.11  --   --   --   --  0.63  --  0.37  LATICACIDVEN  --    < > 2.4* 2.6* >9.0* 4.8* 2.1*  --   --   --   --   INR 1.2  --   --   --   --   --   --   --   --   --   --   TSH  --   --   --  1.671  --   --   --   --  4.504*  --   --   HGBA1C  --   --   --   --   --   --   --   --   --  5.9*  --   AMMONIA  --   --   --  16  --   --   --   --  22  --  15  BNP  --   --   --   --    --   --   --   --  558.3*  --  653.6*  MG  --   --   --   --   --   --   --   --  1.7  --  1.5*  PHOS  --   --   --   --   --   --   --   --  2.4*  --  3.4  CALCIUM 8.8*  --   --  8.4*  --   --   --  8.5*  --   --  8.6*   < > = values in this interval not displayed.      Recent Labs  Lab 10/23/23 2107 10/23/23 2113 10/23/23 2321 10/24/23 0202 10/24/23 0700 10/24/23 0954 10/24/23 1948 10/25/23 0524 10/25/23 0536 10/25/23 2012 10/26/23 0613  CRP  --   --   --   --   --   --   --   --  15.5*  --  11.0*  DDIMER 8.69*  --   --   --   --   --   --   --   --   --   --   PROCALCITON  --   --   --  1.11  --   --   --   --  0.63  --  0.37  LATICACIDVEN  --    < > 2.4* 2.6* >9.0* 4.8* 2.1*  --   --   --   --   INR 1.2  --   --   --   --   --   --   --   --   --   --   TSH  --   --   --  1.671  --   --   --   --  4.504*  --   --   HGBA1C  --   --   --   --   --   --   --   --   --  5.9*  --   AMMONIA  --   --   --  16  --   --   --   --  22  --  15  BNP  --   --   --   --   --   --   --   --  558.3*  --  653.6*  MG  --   --   --   --   --   --   --   --  1.7  --  1.5*  CALCIUM 8.8*  --   --  8.4*  --   --   --  8.5*  --   --  8.6*   < > = values in this interval not displayed.    --------------------------------------------------------------------------------------------------------------- No results found for: CHOL, HDL, LDLCALC, LDLDIRECT, TRIG, CHOLHDL  Lab Results  Component Value Date   HGBA1C 5.9 (H) 10/25/2023   Recent Labs    10/25/23 0536  TSH 4.504*  FREET4 0.84   Recent Labs    10/24/23 0202  VITAMINB12 1,840*   ------------------------------------------------------------------------------------------------------------------ Cardiac Enzymes No results for input(s): CKMB, TROPONINI, MYOGLOBIN in the last 168 hours.  Invalid input(s): CK  Micro Results Recent Results (from the past 240 hours)  Blood Culture (routine x 2)     Status:  Abnormal (Preliminary result)   Collection Time: 10/23/23  9:06 PM   Specimen: BLOOD LEFT WRIST  Result Value Ref Range Status   Specimen Description BLOOD LEFT WRIST  Final   Special Requests   Final    BOTTLES DRAWN AEROBIC AND ANAEROBIC Blood Culture adequate volume   Culture  Setup Time   Final    GRAM POSITIVE COCCI IN BOTH AEROBIC AND ANAEROBIC BOTTLES CRITICAL VALUE NOTED.  VALUE IS CONSISTENT WITH PREVIOUSLY REPORTED AND CALLED VALUE.    Culture (A)  Final    STAPHYLOCOCCUS EPIDERMIDIS SUSCEPTIBILITIES TO FOLLOW Performed at [facility], [address], [place], [place] [address]    Report Status PENDING  Incomplete  Blood Culture (routine x 2)     Status: Abnormal   Collection Time: 10/23/23  9:07 PM   Specimen: BLOOD RIGHT WRIST  Result Value Ref Range Status   Specimen Description BLOOD RIGHT WRIST  Final   Special Requests   Final    BOTTLES DRAWN AEROBIC AND ANAEROBIC Blood Culture adequate volume   Culture  Setup Time   Final    GRAM POSITIVE COCCI IN CLUSTERS GRAM POSITIVE COCCI IN CHAINS IN BOTH AEROBIC AND ANAEROBIC BOTTLES CRITICAL RESULT CALLED TO, READ BACK BY AND VERIFIED WITH: PHARMD JEREMY FRENS ON 10/24/23 @ 1502 BY DRT Performed at [facility], [address] [place], [place] [address]    Culture (A)  Final    STAPHYLOCOCCUS EPIDERMIDIS STREPTOCOCCUS MITIS/ORALIS    Report Status 10/26/2023 FINAL  Final   Organism ID, Bacteria STAPHYLOCOCCUS EPIDERMIDIS  Final   Organism ID, Bacteria STREPTOCOCCUS MITIS/ORALIS  Final      Susceptibility   Staphylococcus epidermidis - MIC*    CIPROFLOXACIN 2 INTERMEDIATE Intermediate     ERYTHROMYCIN <=0.25 SENSITIVE Sensitive     GENTAMICIN <=0.5 SENSITIVE Sensitive     OXACILLIN >=4 RESISTANT Resistant     TETRACYCLINE >=16 RESISTANT Resistant     VANCOMYCIN  <=0.5 SENSITIVE Sensitive     TRIMETH/SULFA <=10 SENSITIVE Sensitive     CLINDAMYCIN <=0.25 SENSITIVE Sensitive     RIFAMPIN <=0.5 SENSITIVE  Sensitive  Inducible Clindamycin NEGATIVE Sensitive     * STAPHYLOCOCCUS EPIDERMIDIS   Streptococcus mitis/oralis - MIC*    PENICILLIN <=0.06 SENSITIVE Sensitive     CEFTRIAXONE <=0.12 SENSITIVE Sensitive     LEVOFLOXACIN 1 SENSITIVE Sensitive     VANCOMYCIN  0.5 SENSITIVE Sensitive     * STREPTOCOCCUS MITIS/ORALIS  Blood Culture ID Panel (Reflexed)     Status: Abnormal   Collection Time: 10/23/23  9:07 PM  Result Value Ref Range Status   Enterococcus faecalis NOT DETECTED NOT DETECTED Final   Enterococcus Faecium NOT DETECTED NOT DETECTED Final   Listeria monocytogenes NOT DETECTED NOT DETECTED Final   Staphylococcus species DETECTED (A) NOT DETECTED Final    Comment: CRITICAL RESULT CALLED TO, READ BACK BY AND VERIFIED WITH: PHARMD JEREMY FRENS ON 10/24/23 @ 1502 BY DRT    Staphylococcus aureus (BCID) NOT DETECTED NOT DETECTED Final   Staphylococcus epidermidis DETECTED (A) NOT DETECTED Final    Comment: Methicillin (oxacillin) resistant coagulase negative staphylococcus. Possible blood culture contaminant (unless isolated from more than one blood culture draw or clinical Hagan suggests pathogenicity). No antibiotic treatment is indicated for blood  culture contaminants. CRITICAL RESULT CALLED TO, READ BACK BY AND VERIFIED WITH: PHARMD JEREMY FRENS ON 10/24/23 @ 1502 BY DRT    Staphylococcus lugdunensis NOT DETECTED NOT DETECTED Final   Streptococcus species DETECTED (A) NOT DETECTED Final    Comment: Not Enterococcus species, Streptococcus agalactiae, Streptococcus pyogenes, or Streptococcus pneumoniae. CRITICAL RESULT CALLED TO, READ BACK BY AND VERIFIED WITH: PHARMD JEREMY FRENS ON 10/24/23 @ 1502 BY DRT    Streptococcus agalactiae NOT DETECTED NOT DETECTED Final   Streptococcus pneumoniae NOT DETECTED NOT DETECTED Final   Streptococcus pyogenes NOT DETECTED NOT DETECTED Final   A.calcoaceticus-baumannii NOT DETECTED NOT DETECTED Final   Bacteroides fragilis NOT DETECTED NOT  DETECTED Final   Enterobacterales NOT DETECTED NOT DETECTED Final   Enterobacter cloacae complex NOT DETECTED NOT DETECTED Final   Escherichia coli NOT DETECTED NOT DETECTED Final   Klebsiella aerogenes NOT DETECTED NOT DETECTED Final   Klebsiella oxytoca NOT DETECTED NOT DETECTED Final   Klebsiella pneumoniae NOT DETECTED NOT DETECTED Final   Proteus species NOT DETECTED NOT DETECTED Final   Salmonella species NOT DETECTED NOT DETECTED Final   Serratia marcescens NOT DETECTED NOT DETECTED Final   Haemophilus influenzae NOT DETECTED NOT DETECTED Final   Neisseria meningitidis NOT DETECTED NOT DETECTED Final   Pseudomonas aeruginosa NOT DETECTED NOT DETECTED Final   Stenotrophomonas maltophilia NOT DETECTED NOT DETECTED Final   Candida albicans NOT DETECTED NOT DETECTED Final   Candida auris NOT DETECTED NOT DETECTED Final   Candida glabrata NOT DETECTED NOT DETECTED Final   Candida krusei NOT DETECTED NOT DETECTED Final   Candida parapsilosis NOT DETECTED NOT DETECTED Final   Candida tropicalis NOT DETECTED NOT DETECTED Final   Cryptococcus neoformans/gattii NOT DETECTED NOT DETECTED Final   Methicillin resistance mecA/C DETECTED (A) NOT DETECTED Final    Comment: CRITICAL RESULT CALLED TO, READ BACK BY AND VERIFIED WITH: PHARMD JEREMY FRENS ON 10/24/23 @ 1502 BY DRT Performed at [facility], [address], [place], [place] [address]   Urine Culture     Status: None   Collection Time: 10/23/23 11:49 PM   Specimen: Urine, Random  Result Value Ref Range Status   Specimen Description URINE, RANDOM  Final   Special Requests NONE Reflexed from [id number]  Final   Culture   Final    NO GROWTH Performed at  [facility], [address], [place], [place] [address]    Report Status 10/24/2023 FINAL  Final  Respiratory (~20 pathogens) panel by PCR     Status: None   Collection Time: 10/24/23  2:02 AM   Specimen: Nasopharyngeal Swab; Respiratory  Result Value Ref Range Status    Adenovirus NOT DETECTED NOT DETECTED Final   Coronavirus 229E NOT DETECTED NOT DETECTED Final    Comment: (NOTE) The Coronavirus on the Respiratory Panel, DOES NOT test for the novel  Coronavirus (2019 nCoV)    Coronavirus HKU1 NOT DETECTED NOT DETECTED Final   Coronavirus NL63 NOT DETECTED NOT DETECTED Final   Coronavirus OC43 NOT DETECTED NOT DETECTED Final   Metapneumovirus NOT DETECTED NOT DETECTED Final   Rhinovirus / Enterovirus NOT DETECTED NOT DETECTED Final   Influenza A NOT DETECTED NOT DETECTED Final   Influenza B NOT DETECTED NOT DETECTED Final   Parainfluenza Virus 1 NOT DETECTED NOT DETECTED Final   Parainfluenza Virus 2 NOT DETECTED NOT DETECTED Final   Parainfluenza Virus 3 NOT DETECTED NOT DETECTED Final   Parainfluenza Virus 4 NOT DETECTED NOT DETECTED Final   Respiratory Syncytial Virus NOT DETECTED NOT DETECTED Final   Bordetella pertussis NOT DETECTED NOT DETECTED Final   Bordetella Parapertussis NOT DETECTED NOT DETECTED Final   Chlamydophila pneumoniae NOT DETECTED NOT DETECTED Final   Mycoplasma pneumoniae NOT DETECTED NOT DETECTED Final    Comment: Performed at [facility], [address] [place], [place] [address]  MRSA Next Gen by PCR, Nasal     Status: None   Collection Time: 10/24/23  2:02 AM   Specimen: Nasopharyngeal Swab; Nasal Swab  Result Value Ref Range Status   MRSA by PCR Next Gen NOT DETECTED NOT DETECTED Final    Comment: (NOTE) The GeneXpert MRSA Assay (FDA approved for NASAL specimens only), is one component of a comprehensive MRSA colonization surveillance program. It is not intended to diagnose MRSA infection nor to guide or monitor treatment for MRSA infections. Test performance is not FDA approved in patients less than 63 years old. Performed at [facility], [address], [place], [place] [address]     Radiology Report DG Swallowing Func-Speech Pathology Result Date: 10/25/2023 Table formatting from the original  result was not included. Modified Barium Swallow Study Patient Details Name: [name] [name] MRN: [id number] Date of Birth: [birth year] Today's Date: 10/25/2023 HPI/PMH: HPI: 73 yo female adm to [facility] with AMS, sepsis - Concern for cellulitis and pna.  She was found covered in feces and maggots.  Pt with PMH + for hiatal hernia, small distal ring, tertiary contractions seen on 2017 UGI study. Pt was in prep for bariatric surgery.  PMH also + for potential bipolar, hoarding, picking at sores and lives alone in a mobile home.  Swallow eval ordered due to pt not passing swallow screen.  Pt admits to issues with dysphagia causing her to cough and sensing food retention in distal pharynx causing her to propel into mouth and reswallow. Clinical Impression: Pt demonstrates a moderate oropharyngeal dysphagia with a DIGEST score of 2. Patient initiates swallow when liquids have reached the pyriform sinuses. If taking a single sip, swallow function is otherwise adequate. But if the patient takes a large or sequential sip, the bolus hesitates above the PES and the second propulsive effort and hyoid burst pushes liquid into the airway and up to the nasopharynx. Pt gags, material moves to mouth and pt reswallows. When taking  a pill there was significant aspiration of a larger thin bolus eliciting hard coughing. When cued to take one sip at a time from a straw or cup pt protected airway consistently. Observed that pt typically has a posterior tilt to head while swallowing. A more neutral or anterior position will also help. Esophageal sweep not possible due to body habitus. Pt agreeable to a trial of therapy to improve awareness of decreased rate but also wants to continue an unrestricted diet. Will resume regular/thin and f/u.  DIGEST Swallow Severity Rating*             Safety: 2             Efficiency:1             Overall Pharyngeal Swallow Severity: 2 1: mild; 2: moderate; 3: severe; 4: profound *The Dynamic Imaging Grade of  Swallowing Toxicity is standardized for the head and neck cancer population, however, demonstrates promising clinical applications across populations to standardize the clinical rating of pharyngeal swallow safety and severity. Factors that may increase risk of adverse event in presence of aspiration Roderick Civatte & Jessy Morocco 2021): Factors that may increase risk of adverse event in presence of aspiration Roderick Civatte & Jessy Morocco 2021): Inadequate oral hygiene; Limited mobility Recommendations/Plan: Swallowing Evaluation Recommendations Swallowing Evaluation Recommendations Liquid Administration via: Cup; Straw Medication Administration: Whole meds with puree Supervision: Patient able to self-feed Swallowing strategies  : Slow rate; Small bites/sips; Minimize environmental distractions Postural changes: Position pt fully upright for meals Oral care recommendations: Oral care BID (2x/day) Treatment Plan Treatment Plan Follow-up recommendations: Home health SLP Functional status assessment: Patient has had a recent decline in their functional status and demonstrates the ability to make significant improvements in function in a reasonable and predictable amount of time. Treatment frequency: Min 2x/week Treatment duration: 2 weeks Interventions: Aspiration precaution training Recommendations Recommendations for follow up therapy are one component of a multi-disciplinary discharge planning process, led by the attending physician.  Recommendations may be updated based on patient status, additional functional criteria and insurance authorization. Assessment: Orofacial Exam: Orofacial Exam Oral Cavity: Oral Hygiene: Xerostomia Oral Cavity - Dentition: Poor condition Oral Motor/Sensory Function: WFL Anatomy: Anatomy: WFL Boluses Administered: Boluses Administered Boluses Administered: Thin liquids (Level 0); Mildly thick liquids (Level 2, nectar thick); Puree; Solid  Oral Impairment Domain: Oral Impairment Domain Lip Closure: No labial  escape Tongue control during bolus hold: Cohesive bolus between tongue to palatal seal Bolus preparation/mastication: Slow prolonged chewing/mashing with complete recollection (missing dentition) Bolus transport/lingual motion: Brisk tongue motion Oral residue: Trace residue lining oral structures Location of oral residue : Tongue Initiation of pharyngeal swallow : Pyriform sinuses  Pharyngeal Impairment Domain: Pharyngeal Impairment Domain Soft palate elevation: Trace column of contrast or air between SP and PW; Escape to nasopharynx Laryngeal elevation: Complete superior movement of thyroid cartilage with complete approximation of arytenoids to epiglottic petiole Anterior hyoid excursion: Complete anterior movement Epiglottic movement: Complete inversion Laryngeal vestibule closure: Incomplete, narrow column air/contrast in laryngeal vestibule Pharyngeal stripping wave : Present - complete Pharyngoesophageal segment opening: Complete distension and complete duration, no obstruction of flow Tongue base retraction: Trace column of contrast or air between tongue base and PPW Pharyngeal residue: Trace residue within or on pharyngeal structures Location of pharyngeal residue: Valleculae; Tongue base  Esophageal Impairment Domain: No data recorded Pill: Pill Consistency administered: Thin liquids (Level 0) Thin liquids (Level 0): Impaired (see clinical impressions) Penetration/Aspiration Scale Score: Penetration/Aspiration Scale Score 1.  Material does not enter airway: Thin  liquids (Level 0); Mildly thick liquids (Level 2, nectar thick); Puree; Solid 4.  Material enters airway, CONTACTS cords then ejected out: Thin liquids (Level 0) 7.  Material enters airway, passes BELOW cords and not ejected out despite cough attempt by patient: Thin liquids (Level 0) Compensatory Strategies: Compensatory Strategies Compensatory strategies: Yes Oral bolus hold: Effective   General Information: Caregiver present: No  Diet Prior to  this Study: NPO   Temperature : Normal   Respiratory Status: WFL   Supplemental O2: None (Room air)   History of Recent Intubation: No  Behavior/Cognition: Alert; Pleasant mood; Cooperative Self-Feeding Abilities: Able to self-feed Baseline vocal quality/speech: Dysphonic Volitional Cough: Able to elicit Volitional Swallow: Able to elicit No data recorded Goal Planning: Prognosis for improved oropharyngeal function: Fair No data recorded No data recorded Patient/Family Stated Goal: wants water  No data recorded Pain: Pain Assessment Pain Assessment: No/denies pain End of Session: Start Time:SLP Start Time (ACUTE ONLY): 1415 Stop Time: SLP Stop Time (ACUTE ONLY): 1440 Time Calculation:SLP Time Calculation (min) (ACUTE ONLY): 25 min Charges: SLP Evaluations $ SLP Speech Visit: 1 Visit SLP Evaluations $BSS Swallow: 1 Procedure $MBS Swallow: 1 Procedure $Swallowing Treatment: 1 Procedure SLP visit diagnosis: SLP Visit Diagnosis: Dysphagia, oropharyngeal phase (R13.12) Past Medical History: Past Medical History: Diagnosis Date  Anemia   Anxiety   Arthritis   Bipolar disorder (HCC)   Bronchitis   COPD (chronic obstructive pulmonary disease) (HCC)   Depression   Diabetes mellitus   type II  Encounter for blood transfusion   GERD (gastroesophageal reflux disease)   Hearing loss   Hyperlipemia   Hypertension   Nonunion, 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  Peripheral neuropathy   Pneumonia   hx of   Shortness of breath   with exertion   Skin yeast infection   history of under bilateral breast,, pannus, inner legs  Sleep apnea   uses bipap- does not know setting s Past Surgical History: Past Surgical History: Procedure Laterality Date  COLONOSCOPY  10/22/2011  Procedure: COLONOSCOPY;  Surgeon: Almeda Aris, MD;  Location: [facility];  Service: Endoscopy;  Laterality: N/A;  COLONOSCOPY WITH PROPOFOL  N/A 01/21/2017  Procedure: COLONOSCOPY WITH PROPOFOL ;  Surgeon: Alvis Jourdain, MD;  Location: [facility];   Service: Endoscopy;  Laterality: N/A;  ESOPHAGOGASTRODUODENOSCOPY  10/22/2011  Procedure: ESOPHAGOGASTRODUODENOSCOPY (EGD);  Surgeon: Almeda Aris, MD;  Location: [facility];  Service: Endoscopy;  Laterality: N/A;  femur fx    FEMUR IM NAIL Right 01/08/2014  Procedure: INTRAMEDULLARY (IM) NAIL RIGHT HIP WITH INFUSED ALLOGRAFTING;  Surgeon: Arlette Lagos, MD;  Location: [facility];  Service: Orthopedics;  Laterality: Right;  HARDWARE REMOVAL  03/27/2012  Procedure: HARDWARE REMOVAL;  Surgeon: Bevin Bucks, MD;  Location: [facility];  Service: Orthopedics;  Laterality: Right;  HARDWARE REMOVAL Right 01/08/2014  Procedure: HARDWARE REMOVAL RIGHT PROXIMAL FEMUR;  Surgeon: Arlette Lagos, MD;  Location: [facility];  Service: Orthopedics;  Laterality: Right;  HIP FRACTURE SURGERY    INTRAMEDULLARY (IM) NAIL INTERTROCHANTERIC  03/27/2012  Procedure: INTRAMEDULLARY (IM) NAIL INTERTROCHANTRIC;  Surgeon: Bevin Bucks, MD;  Location: [facility];  Service: Orthopedics;  Laterality: Right;  Possible Exchange with a Shorter Trochantric Nail  OPEN REDUCTION INTERNAL FIXATION (ORIF) TIBIA/FIBULA FRACTURE Right 06/03/2022  Procedure: OPEN REDUCTION INTERNAL FIXATION (ORIF) OF RIGHT TIBIA;  Surgeon: Hardy Lia, MD;  Location: [facility];  Service: Orthopedics;  Laterality: Right;  TONSILLECTOMY   DeBlois, Hardin Leys 10/25/2023, 3:34 PM  DG Chest Port 1  View Result Date: 10/25/2023 CLINICAL DATA:  73 year old female with shortness of breath. Sepsis. EXAM: PORTABLE CHEST 1 VIEW COMPARISON:  CT Chest, Abdomen, and Pelvis 10/23/2023. FINDINGS: Portable AP semi upright view at 0550 hours. Stable low lung volumes. Small layering pleural effusions better demonstrated on the recent CT. Stable mediastinal contours, heart size normal on the recent CT. Visualized tracheal air column is within normal limits. No pneumothorax. Pulmonary vascular congestion appears regressed compared to 10/23/2023 portable exam, no overt edema now. No air bronchograms.  Paucity of bowel gas in the upper abdomen. Stable visualized osseous structures. IMPRESSION: 1. Regressed pulmonary edema since 10/23/2023. Small layering pleural effusions better demonstrated on recent CT. 2. No new cardiopulmonary abnormality. Electronically Signed   By: Marlise Simpers M.D.   On: 10/25/2023 06:07     Signature  -   Seena Dadds M.D on 10/26/2023 at 10:38 AM   -  To page go to [email or web address]

## 2023-10-26 NOTE — Progress Notes (Signed)
 SLP Cancellation Note  Patient Details Name: [name] [name] MRN: [id number] DOB: 07-20-1950   Cancelled treatment:       Reason Eval/Treat Not Completed: Other (comment) (pt undergoing procedure at this time; will continue efforts)  Maudie Sorrow, MS [facility] SLP Acute Rehab Services Office [phone]  Chantal Comment 10/26/2023, 8:36 AM

## 2023-10-27 DIAGNOSIS — A419 Sepsis, unspecified organism: Secondary | ICD-10-CM | POA: Diagnosis not present

## 2023-10-27 DIAGNOSIS — R6520 Severe sepsis without septic shock: Secondary | ICD-10-CM | POA: Diagnosis not present

## 2023-10-27 LAB — COMPREHENSIVE METABOLIC PANEL WITH GFR
ALT: 39 U/L (ref 0–44)
AST: 45 U/L — ABNORMAL HIGH (ref 15–41)
Albumin: 2.1 g/dL — ABNORMAL LOW (ref 3.5–5.0)
Alkaline Phosphatase: 45 U/L (ref 38–126)
Anion gap: 9 (ref 5–15)
BUN: 17 mg/dL (ref 8–23)
CO2: 20 mmol/L — ABNORMAL LOW (ref 22–32)
Calcium: 8.4 mg/dL — ABNORMAL LOW (ref 8.9–10.3)
Chloride: 111 mmol/L (ref 98–111)
Creatinine, Ser: 1.09 mg/dL — ABNORMAL HIGH (ref 0.44–1.00)
GFR, Estimated: 53 mL/min — ABNORMAL LOW (ref >60)
Glucose, Bld: 112 mg/dL — ABNORMAL HIGH (ref 70–99)
Potassium: 4.5 mmol/L (ref 3.5–5.1)
Sodium: 140 mmol/L (ref 135–145)
Total Bilirubin: 0.6 mg/dL (ref 0.0–1.2)
Total Protein: 5.3 g/dL — ABNORMAL LOW (ref 6.5–8.1)

## 2023-10-27 LAB — GLUCOSE, CAPILLARY
Glucose-Capillary: 125 mg/dL — ABNORMAL HIGH (ref 70–99)
Glucose-Capillary: 117 mg/dL — ABNORMAL HIGH (ref 70–99)
Glucose-Capillary: 119 mg/dL — ABNORMAL HIGH (ref 70–99)
Glucose-Capillary: 134 mg/dL — ABNORMAL HIGH (ref 70–99)

## 2023-10-27 LAB — CBC WITH DIFFERENTIAL/PLATELET
Abs Immature Granulocytes: 0 10*3/uL (ref 0.00–0.07)
Basophils Absolute: 0.1 10*3/uL (ref 0.0–0.1)
Basophils Relative: 1 %
Eosinophils Absolute: 0.6 10*3/uL — ABNORMAL HIGH (ref 0.0–0.5)
Eosinophils Relative: 4 %
HCT: 32 % — ABNORMAL LOW (ref 36.0–46.0)
Hemoglobin: 10.1 g/dL — ABNORMAL LOW (ref 12.0–15.0)
Lymphocytes Relative: 17 %
Lymphs Abs: 2.4 10*3/uL (ref 0.7–4.0)
MCH: 27.8 pg (ref 26.0–34.0)
MCHC: 31.6 g/dL (ref 30.0–36.0)
MCV: 88.2 fL (ref 80.0–100.0)
Monocytes Absolute: 0.4 10*3/uL (ref 0.1–1.0)
Monocytes Relative: 3 %
Neutro Abs: 10.5 10*3/uL — ABNORMAL HIGH (ref 1.7–7.7)
Neutrophils Relative %: 75 %
Platelets: 334 10*3/uL (ref 150–400)
RBC: 3.63 MIL/uL — ABNORMAL LOW (ref 3.87–5.11)
RDW: 14 % (ref 11.5–15.5)
WBC: 14 10*3/uL — ABNORMAL HIGH (ref 4.0–10.5)
nRBC: 0 % (ref 0.0–0.2)
nRBC: 0 /100{WBCs}

## 2023-10-27 LAB — CULTURE, BLOOD (ROUTINE X 2): Special Requests: ADEQUATE

## 2023-10-27 LAB — PROCALCITONIN: Procalcitonin: 0.22 ng/mL

## 2023-10-27 LAB — AMMONIA: Ammonia: 30 umol/L (ref 9–35)

## 2023-10-27 LAB — BRAIN NATRIURETIC PEPTIDE: B Natriuretic Peptide: 796.6 pg/mL — ABNORMAL HIGH (ref 0.0–100.0)

## 2023-10-27 LAB — PHOSPHORUS: Phosphorus: 2.9 mg/dL (ref 2.5–4.6)

## 2023-10-27 LAB — C-REACTIVE PROTEIN: CRP: 6.7 mg/dL — ABNORMAL HIGH (ref <1.0)

## 2023-10-27 LAB — MAGNESIUM: Magnesium: 1.7 mg/dL (ref 1.7–2.4)

## 2023-10-27 MED ORDER — LINEZOLID 600 MG PO TABS
600.0000 mg | ORAL_TABLET | Freq: Two times a day (BID) | ORAL | Status: AC
  Administered 2023-10-27 – 2023-10-29 (×6): 600 mg via ORAL
  Filled 2023-10-27 (×6): qty 1

## 2023-10-27 MED ORDER — FUROSEMIDE 40 MG PO TABS
40.0000 mg | ORAL_TABLET | Freq: Every day | ORAL | Status: DC
  Administered 2023-10-27 – 2023-10-31 (×5): 40 mg via ORAL
  Filled 2023-10-27 (×5): qty 1

## 2023-10-27 NOTE — Plan of Care (Signed)
  Problem: Education: Goal: Ability to describe self-care measures that may prevent or decrease complications (Diabetes Survival Skills Education) will improve Outcome: Progressing   Problem: Coping: Goal: Ability to adjust to condition or change in health will improve Outcome: Progressing   Problem: Fluid Volume: Goal: Ability to maintain a balanced intake and output will improve Outcome: Progressing   Problem: Health Behavior/Discharge Planning: Goal: Ability to identify and utilize available resources and services will improve Outcome: Progressing Goal: Ability to manage health-related needs will improve Outcome: Progressing   Problem: Metabolic: Goal: Ability to maintain appropriate glucose levels will improve Outcome: Progressing   Problem: Nutritional: Goal: Maintenance of adequate nutrition will improve Outcome: Progressing   Problem: Skin Integrity: Goal: Risk for impaired skin integrity will decrease Outcome: Progressing   Problem: Tissue Perfusion: Goal: Adequacy of tissue perfusion will improve Outcome: Progressing   Problem: Health Behavior/Discharge Planning: Goal: Ability to manage health-related needs will improve Outcome: Progressing   Problem: Clinical Measurements: Goal: Will remain free from infection Outcome: Progressing Goal: Diagnostic test results will improve Outcome: Progressing   Problem: Activity: Goal: Risk for activity intolerance will decrease Outcome: Progressing   Problem: Pain Managment: Goal: General experience of comfort will improve and/or be controlled Outcome: Progressing   Problem: Safety: Goal: Ability to remain free from injury will improve Outcome: Progressing   Problem: Skin Integrity: Goal: Risk for impaired skin integrity will decrease Outcome: Progressing  Pt rested well during the shift and stated she would be more mobile during the day, resisted being respostioned at times and would cry out HOB > 65  degrees to  aide taking am medication Synthroid  with small sips of water , pt on thin liquids, clearing her throat after swallowing, and occasional mild coughing noted.  Pt voiced no discomforts this morning and encouraged to increase mobility today.  IVF LR discontinued per order, Pt with dressings in place clean dry ad intact, external catheter in place, Puriform external catheter in place and urine being drained away without issues.

## 2023-10-27 NOTE — Care Management Important Message (Signed)
 Important Message  Patient Details  Name: [name] [name] MRN: [id number] Date of Birth: 1950/12/11   Important Message Given:  Yes - Medicare IM     Felix Host 10/27/2023, 3:04 PM

## 2023-10-27 NOTE — Plan of Care (Signed)

## 2023-10-27 NOTE — TOC Progression Note (Signed)
 Transition of Care [facility]) - Progression Note    Patient Details  Name: [name] [name] MRN: [id number] Date of Birth: Jan 10, 1951  Transition of Care [facility]) CM/SW Contact  [name], [place] Phone Number: 10/27/2023, 2:35 PM  Clinical Narrative:    Requested [facility] review as only local offer is [place].    Expected Discharge Plan: Skilled Nursing Facility Barriers to Discharge: Continued Medical Work up, English as a second language teacher, SNF Pending bed offer  Expected Discharge Plan and Services In-house Referral: Clinical Social Work   Post Acute Care Choice: Skilled Nursing Facility Living arrangements for the past 2 months: Mobile Home                                       Social Determinants of Health (SDOH) Interventions SDOH Screenings   Food Insecurity: Food Insecurity Present (10/25/2023)  Housing: High Risk (10/25/2023)  Transportation Needs: Unmet Transportation Needs (10/25/2023)  Utilities: At Risk (10/25/2023)  Social Connections: Socially Isolated (10/25/2023)  Tobacco Use: Medium Risk (10/24/2023)    Readmission Risk Interventions    06/01/2022   12:03 PM  Readmission Risk Prevention Plan  Post Dischage Appt Complete  Medication Screening Complete  Transportation Screening Complete

## 2023-10-27 NOTE — Progress Notes (Signed)
 PROGRESS NOTE                                                                                                                                                                                                             Patient Demographics:    [name] [name], is a 73 y.o. female, DOB - 05-Aug-1950, NGE:[id number]  Outpatient Primary MD for the patient is Lory Rough., PA-C    LOS - 3  Admit date - 10/23/2023    Chief Complaint  Patient presents with   Code Sepsis       Brief Narrative (HPI from H&P)      73 y.o. female with medical history significant of COPD, essential hypertension, hyperlipidemia, DM type II, peripheral neuropathy, chronic right femoral fracture, right proximal tibial fracture status post ORIF, chronic constipation, chronic depressive disorder, bipolar disorder, hypothyroidism, hoarding behavior, vitamin D  deficiency, and morbid obesity presented emergency department via EMS as being called out for a welfare check.  EMS found the patient on the ground in her own urine and feces with maggots crawling on her seen by EMS.  Unknown downtime and fall.  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   Subjective:    [name] [name] today with no significant events overnight as discussed with staff, reports good appetite.     Assessment  & Plan :    Severe sepsis POA CAP Bacteremia Pressure ulcer with surrounding cellulitis. - Severe sepsis present on admission. - Sepsis related to pneumonia, and generalized cellulitis infested with Magots. - As well blood culture and positive for staph, multiple species. - ID input greatly appreciated, biotics management per ID, treated with IV vancomycin , transition to Zyvox  Acute hypoxic respiratory failure -Was felt secondary to pneumonia, encouraged use incentive spirometry and flutter valve..   Maggot infestation -Patient found to be buried in Maggots all  over her body.  It has been cleaned while in the ED.   Acute kidney injury superimposed CKD stage IIIb Rhabdomyolysis -AKI most likely in the setting of dehydration, and rhabdomyolysis from being on the floor for couple days. -Improving with IV fluids, continue to monitor total CK.  Acute metabolic encephalopathy-due to dehydration and sepsis. - CT head and C-spine nonacute, no headache or focal deficits, mentation improving, continue supportive care, minimize narcotics and benzodiazepines, stable TSH,  B12, RPR and ammonia levels.    Mild transaminitis Hyperbilirubinemia -Transaminitis and hyperbilirubinemia in the setting of dehydration and rhabdomyolysis.  No right upper quadrant tenderness, negative acute hepatitis panel trend is improving.  Monitor.     History of femoral neck fracture Chronic pain syndrome Supportive care, CT noted, discussed with orthopedics PA Umberto Ganong, CT finding of loose hardware is chronic.  Nothing to be done.   Hypophosphatemia Hypomagnesemia - replaced  Anemia of chronic disease -Stable H&H.   Essential hypertension -Hold blood pressure medication in the setting of sepsis and hypotension.   Generalized anxiety disorder Major depressive disorder Bipolar disorder -Will resume home benzos now mentation has improved   Hypothyroidism -Continue  levothyroxine    Peripheral neuropathy -Mentation has improved resume home gabapentin    History of COPD  Acute hypoxic respiratory failure in setting of pneumonia -Continue  supplemental oxygen and check pulse ox. -Continue DuoNeb as needed.  Non-insulin -dependent DM type II currently has hypoglycemia likely due to prolonged starvation and downtime -Continue check POC blood glucose in the setting of altered mental status and NPO.[name]  Holding metformin , TSH stable, check random cortisol, D5 drip for now  Lab Results  Component Value Date   HGBA1C 5.9 (H) 10/25/2023   CBG (last 3)  Recent Labs     10/26/23 1706 10/26/23 2023 10/27/23 0804  GLUCAP 102* 162* 125*    Pressure ulcers: - Is from being on the floor for extended period of time -Continue with wound care Pressure Injury 10/24/23 Face Left Deep Tissue Pressure Injury - Purple or maroon localized area of discolored intact skin or blood-filled blister due to damage of underlying soft tissue from pressure and/or shear. (Active)  10/24/23   Location: Face  Location Orientation: Left  Staging: Deep Tissue Pressure Injury - Purple or maroon localized area of discolored intact skin or blood-filled blister due to damage of underlying soft tissue from pressure and/or shear.  Wound Description (Comments):   DO NOT USE:  Present on Admission: Yes     Pressure Injury 10/24/23 Breast Left Unstageable - Full thickness tissue loss in which the base of the injury is covered by slough (yellow, tan, gray, green or brown) and/or eschar (tan, brown or black) in the wound bed. (Active)  10/24/23   Location: Breast  Location Orientation: Left  Staging: Unstageable - Full thickness tissue loss in which the base of the injury is covered by slough (yellow, tan, gray, green or brown) and/or eschar (tan, brown or black) in the wound bed.  Wound Description (Comments):   DO NOT USE:  Present on Admission: Yes     Pressure Injury 10/24/23 Abdomen Lower;Left Unstageable - Full thickness tissue loss in which the base of the injury is covered by slough (yellow, tan, gray, green or brown) and/or eschar (tan, brown or black) in the wound bed. (Active)  10/24/23   Location: Abdomen  Location Orientation: Lower;Left  Staging: Unstageable - Full thickness tissue loss in which the base of the injury is covered by slough (yellow, tan, gray, green or brown) and/or eschar (tan, brown or black) in the wound bed.  Wound Description (Comments):   DO NOT USE:  Present on Admission: Yes     Pressure Injury 10/24/23 Knee Right;Left Deep Tissue Pressure Injury -  Purple or maroon localized area of discolored intact skin or blood-filled blister due to damage of underlying soft tissue from pressure and/or shear. (Active)  10/24/23   Location: Knee  Location Orientation: Right;Left  Staging: Deep  Tissue Pressure Injury - Purple or maroon localized area of discolored intact skin or blood-filled blister due to damage of underlying soft tissue from pressure and/or shear.  Wound Description (Comments):   DO NOT USE:  Present on Admission: Yes     Pressure Injury 10/24/23 Toe (Comment  which one) Right;Left;Anterior Deep Tissue Pressure Injury - Purple or maroon localized area of discolored intact skin or blood-filled blister due to damage of underlying soft tissue from pressure and/or shear. (Active)  10/24/23   Location: Toe (Comment  which one)  Location Orientation: Right;Left;Anterior  Staging: Deep Tissue Pressure Injury - Purple or maroon localized area of discolored intact skin or blood-filled blister due to damage of underlying soft tissue from pressure and/or shear.  Wound Description (Comments):   DO NOT USE:  Present on Admission: Yes     Pressure Injury 10/24/23 Hand Left Deep Tissue Pressure Injury - Purple or maroon localized area of discolored intact skin or blood-filled blister due to damage of underlying soft tissue from pressure and/or shear. (Active)  10/24/23   Location: Hand  Location Orientation: Left  Staging: Deep Tissue Pressure Injury - Purple or maroon localized area of discolored intact skin or blood-filled blister due to damage of underlying soft tissue from pressure and/or shear.  Wound Description (Comments):   DO NOT USE:  Present on Admission: Yes          Condition - Extremely Guarded  Family Communication  : None present  Code Status : DNR  Consults  : None  PUD Prophylaxis : PPI   Procedures  :     CT chest abdomen and pelvis- 1. Geographic ground-glass opacities and interlobular septal thickening in the  upper lobes. Distribution favors atypical infection. Edema not excluded. Small right and trace left pleural effusions. 2. Distended gallbladder with large gallstones in the gallbladder neck. No definite gallbladder wall thickening or pericholecystic fluid noting limitations of motion. If there is concern for acute cholecystitis, right upper quadrant ultrasound is recommended. 3. Loosening about the right femur hardware. 4. Aortic Atherosclerosis   CT head C-spine.  Nonacute.      Disposition Plan  :    Status is: Inpatient   DVT Prophylaxis  :    heparin injection 5,000 Units Start: 10/24/23 0600 SCDs Start: 10/24/23 0056 Place TED hose Start: 10/24/23 0056     Lab Results  Component Value Date   PLT 334 10/27/2023    Diet :  Diet Order             Diet regular Room service appropriate? Yes with Assist; Fluid consistency: Thin  Diet effective now                    Inpatient Medications  Scheduled Meds:  cholecalciferol   2,000 Units Oral Daily   fluconazole  150 mg Oral Daily   gabapentin   600 mg Oral QHS   heparin  5,000 Units Subcutaneous Q8H   insulin  aspart  0-6 Units Subcutaneous TID WC   leptospermum manuka honey  1 Application Topical Daily   levothyroxine   25 mcg Oral Q0600   linezolid  600 mg Oral Q12H   pantoprazole  (PROTONIX ) IV  40 mg Intravenous Q24H   silver sulfADIAZINE   Topical Daily   sodium chloride  flush  3 mL Intravenous Q12H   Continuous Infusions:   PRN Meds:.clorazepate, dextrose , hydrALAZINE , ipratropium-albuterol , ondansetron  **OR** ondansetron  (ZOFRAN ) IV, senna-docusate    Objective:   Vitals:   10/26/23 1920 10/26/23  2340 10/27/23 0343 10/27/23 0808  BP: (!) 174/92 (!) 154/83 (!) 156/76 (!) 160/81  Pulse:  89 83 83  Resp:  (!) 24 20 (!) 23  Temp: 98.4 F (36.9 C) 98.5 F (36.9 C) 98.7 F (37.1 C) 97.9 F (36.6 C)  TempSrc: Oral Oral Axillary Oral  SpO2:  93% 92% 94%  Weight:      Height:        Wt Readings from  Last 3 Encounters:  10/23/23 117.8 kg  07/09/22 121.6 kg  05/30/22 (!) 136.1 kg     Intake/Output Summary (Last 24 hours) at 10/27/2023 1102 Last data filed at 10/26/2023 2212 Gross per 24 hour  Intake 6 ml  Output 800 ml  Net -794 ml     Physical Exam   Awake Alert, deconditioned appropriate Symmetrical Chest wall movement, Good air movement bilaterally. RRR,No Gallops,Rubs or new Murmurs, No Parasternal Heave +ve B.Sounds, Abd Soft, No tenderness, No rebound - guarding or rigidity. No Cyanosis, Clubbing or edema, No new Rash or bruise    Multiple pressure ulcers in multiple parts of the body including left face, please see picture below.    RN pressure injury documentation: Pressure Injury 10/24/23 Face Left Deep Tissue Pressure Injury - Purple or maroon localized area of discolored intact skin or blood-filled blister due to damage of underlying soft tissue from pressure and/or shear. (Active)  10/24/23   Location: Face  Location Orientation: Left  Staging: Deep Tissue Pressure Injury - Purple or maroon localized area of discolored intact skin or blood-filled blister due to damage of underlying soft tissue from pressure and/or shear.  Wound Description (Comments):   DO NOT USE:  Present on Admission: Yes  Dressing Type Foam - Lift dressing to assess site every shift 10/26/23 2000     Pressure Injury 10/24/23 Breast Left Unstageable - Full thickness tissue loss in which the base of the injury is covered by slough (yellow, tan, gray, green or brown) and/or eschar (tan, brown or black) in the wound bed. (Active)  10/24/23   Location: Breast  Location Orientation: Left  Staging: Unstageable - Full thickness tissue loss in which the base of the injury is covered by slough (yellow, tan, gray, green or brown) and/or eschar (tan, brown or black) in the wound bed.  Wound Description (Comments):   DO NOT USE:  Present on Admission: Yes  Dressing Type Foam - Lift dressing to assess  site every shift 10/26/23 2000     Pressure Injury 10/24/23 Abdomen Lower;Left Unstageable - Full thickness tissue loss in which the base of the injury is covered by slough (yellow, tan, gray, green or brown) and/or eschar (tan, brown or black) in the wound bed. (Active)  10/24/23   Location: Abdomen  Location Orientation: Lower;Left  Staging: Unstageable - Full thickness tissue loss in which the base of the injury is covered by slough (yellow, tan, gray, green or brown) and/or eschar (tan, brown or black) in the wound bed.  Wound Description (Comments):   DO NOT USE:  Present on Admission: Yes  Dressing Type Foam - Lift dressing to assess site every shift 10/26/23 2000     Pressure Injury 10/24/23 Knee Right;Left Deep Tissue Pressure Injury - Purple or maroon localized area of discolored intact skin or blood-filled blister due to damage of underlying soft tissue from pressure and/or shear. (Active)  10/24/23   Location: Knee  Location Orientation: Right;Left  Staging: Deep Tissue Pressure Injury - Purple or maroon localized area of  discolored intact skin or blood-filled blister due to damage of underlying soft tissue from pressure and/or shear.  Wound Description (Comments):   DO NOT USE:  Present on Admission: Yes  Dressing Type Foam - Lift dressing to assess site every shift 10/26/23 2000     Pressure Injury 10/24/23 Toe (Comment  which one) Right;Left;Anterior Deep Tissue Pressure Injury - Purple or maroon localized area of discolored intact skin or blood-filled blister due to damage of underlying soft tissue from pressure and/or shear. (Active)  10/24/23   Location: Toe (Comment  which one)  Location Orientation: Right;Left;Anterior  Staging: Deep Tissue Pressure Injury - Purple or maroon localized area of discolored intact skin or blood-filled blister due to damage of underlying soft tissue from pressure and/or shear.  Wound Description (Comments):   DO NOT USE:  Present on Admission:  Yes  Dressing Type Foam - Lift dressing to assess site every shift 10/26/23 2000     Pressure Injury 10/24/23 Hand Left Deep Tissue Pressure Injury - Purple or maroon localized area of discolored intact skin or blood-filled blister due to damage of underlying soft tissue from pressure and/or shear. (Active)  10/24/23   Location: Hand  Location Orientation: Left  Staging: Deep Tissue Pressure Injury - Purple or maroon localized area of discolored intact skin or blood-filled blister due to damage of underlying soft tissue from pressure and/or shear.  Wound Description (Comments):   DO NOT USE:  Present on Admission: Yes  Dressing Type Foam - Lift dressing to assess site every shift 10/26/23 2000      Data Review:    Recent Labs  Lab 10/23/23 2107 10/24/23 0202 10/25/23 0524 10/26/23 0613 10/27/23 0737  WBC 26.7* 22.0* 14.4* 11.6* 14.0*  HGB 12.8 11.2* 9.8* 9.9* 10.1*  HCT 40.9 36.5 32.5* 32.4* 32.0*  PLT 471* 388 295 286 334  MCV 88.3 90.8 90.5 89.3 88.2  MCH 27.6 27.9 27.3 27.3 27.8  MCHC 31.3 30.7 30.2 30.6 31.6  RDW 13.6 13.7 13.9 14.0 14.0  LYMPHSABS 1.2  --  1.5 1.7 2.4  MONOABS 1.9*  --  0.7 0.6 0.4  EOSABS 0.0  --  0.9* 1.0* 0.6*  BASOSABS 0.1  --  0.0 0.1 0.1    Recent Labs  Lab 10/23/23 2107 10/23/23 2113 10/23/23 2321 10/24/23 0202 10/24/23 0700 10/24/23 0954 10/24/23 1948 10/25/23 0524 10/25/23 0536 10/25/23 2012 10/26/23 0613 10/27/23 0737  NA 141  --   --  143  --   --   --  145  --   --  142 140  K 4.2  --   --  4.0  --   --   --  3.8  --   --  3.7 4.5  CL 109  --   --  112*  --   --   --  114*  --   --  113* 111  CO2 17*  --   --  19*  --   --   --  19*  --   --  23 20*  ANIONGAP 15  --   --  12  --   --   --  12  --   --  6 9  GLUCOSE 144*  --   --  139*  --   --   --  93  --   --  113* 112*  BUN 39*  --   --  37*  --   --   --  29*  --   --  25* 17  CREATININE 1.87*  --   --  1.59*  --   --   --  1.10*  --   --  1.10* 1.09*  AST 129*  --   --   104*  --   --   --  79*  --   --  77* 45*  ALT 48*  --   --  44  --   --   --  41  --   --  48* 39  ALKPHOS 61  --   --  47  --   --   --  37*  --   --  52 45  BILITOT 1.3*  --   --  1.0  --   --   --  0.7  --   --  0.7 0.6  ALBUMIN  2.2*  --   --  1.9*  --   --   --  2.4*  --   --  2.3* 2.1*  CRP  --   --   --   --   --   --   --   --  15.5*  --  11.0* 6.7*  DDIMER 8.69*  --   --   --   --   --   --   --   --   --   --   --   PROCALCITON  --   --   --  1.11  --   --   --   --  0.63  --  0.37 0.22  LATICACIDVEN  --    < > 2.4* 2.6* >9.0* 4.8* 2.1*  --   --   --   --   --   INR 1.2  --   --   --   --   --   --   --   --   --   --   --   TSH  --   --   --  1.671  --   --   --   --  4.504*  --   --   --   HGBA1C  --   --   --   --   --   --   --   --   --  5.9*  --   --   AMMONIA  --   --   --  16  --   --   --   --  22  --  15 30  BNP  --   --   --   --   --   --   --   --  558.3*  --  653.6* 796.6*  MG  --   --   --   --   --   --   --   --  1.7  --  1.5* 1.7  PHOS  --   --   --   --   --   --   --   --  2.4*  --  3.4 2.9  CALCIUM 8.8*  --   --  8.4*  --   --   --  8.5*  --   --  8.6* 8.4*   < > = values in this interval not displayed.      Recent Labs  Lab 10/23/23 2107 10/23/23 2113 10/23/23 2321 10/24/23 0202 10/24/23 0700 10/24/23 0954 10/24/23 1948 10/25/23  1610 10/25/23 0536 10/25/23 2012 10/26/23 0613 10/27/23 0737  CRP  --   --   --   --   --   --   --   --  15.5*  --  11.0* 6.7*  DDIMER 8.69*  --   --   --   --   --   --   --   --   --   --   --   PROCALCITON  --   --   --  1.11  --   --   --   --  0.63  --  0.37 0.22  LATICACIDVEN  --    < > 2.4* 2.6* >9.0* 4.8* 2.1*  --   --   --   --   --   INR 1.2  --   --   --   --   --   --   --   --   --   --   --   TSH  --   --   --  1.671  --   --   --   --  4.504*  --   --   --   HGBA1C  --   --   --   --   --   --   --   --   --  5.9*  --   --   AMMONIA  --   --   --  16  --   --   --   --  22  --  15 30  BNP  --   --   --    --   --   --   --   --  558.3*  --  653.6* 796.6*  MG  --   --   --   --   --   --   --   --  1.7  --  1.5* 1.7  CALCIUM 8.8*  --   --  8.4*  --   --   --  8.5*  --   --  8.6* 8.4*   < > = values in this interval not displayed.    --------------------------------------------------------------------------------------------------------------- No results found for: CHOL, HDL, LDLCALC, LDLDIRECT, TRIG, CHOLHDL  Lab Results  Component Value Date   HGBA1C 5.9 (H) 10/25/2023   Recent Labs    10/25/23 0536  TSH 4.504*  FREET4 0.84   No results for input(s): VITAMINB12, FOLATE, FERRITIN, TIBC, IRON, RETICCTPCT in the last 72 hours.  ------------------------------------------------------------------------------------------------------------------ Cardiac Enzymes No results for input(s): CKMB, TROPONINI, MYOGLOBIN in the last 168 hours.  Invalid input(s): CK  Micro Results Recent Results (from the past 240 hours)  Blood Culture (routine x 2)     Status: Abnormal   Collection Time: 10/23/23  9:06 PM   Specimen: BLOOD LEFT WRIST  Result Value Ref Range Status   Specimen Description BLOOD LEFT WRIST  Final   Special Requests   Final    BOTTLES DRAWN AEROBIC AND ANAEROBIC Blood Culture adequate volume   Culture  Setup Time   Final    GRAM POSITIVE COCCI IN BOTH AEROBIC AND ANAEROBIC BOTTLES CRITICAL VALUE NOTED.  VALUE IS CONSISTENT WITH PREVIOUSLY REPORTED AND CALLED VALUE. Performed at [facility], [address] [place], [place] [address]    Culture STAPHYLOCOCCUS EPIDERMIDIS (A)  Final   Report Status 10/27/2023 FINAL  Final   Organism ID, Bacteria STAPHYLOCOCCUS EPIDERMIDIS  Final  Susceptibility   Staphylococcus epidermidis - MIC*    CIPROFLOXACIN <=0.5 SENSITIVE Sensitive     ERYTHROMYCIN <=0.25 SENSITIVE Sensitive     GENTAMICIN <=0.5 SENSITIVE Sensitive     OXACILLIN >=4 RESISTANT Resistant     TETRACYCLINE >=16 RESISTANT  Resistant     VANCOMYCIN  1 SENSITIVE Sensitive     TRIMETH/SULFA <=10 SENSITIVE Sensitive     CLINDAMYCIN <=0.25 SENSITIVE Sensitive     RIFAMPIN <=0.5 SENSITIVE Sensitive     Inducible Clindamycin NEGATIVE Sensitive     * STAPHYLOCOCCUS EPIDERMIDIS  Blood Culture (routine x 2)     Status: Abnormal   Collection Time: 10/23/23  9:07 PM   Specimen: BLOOD RIGHT WRIST  Result Value Ref Range Status   Specimen Description BLOOD RIGHT WRIST  Final   Special Requests   Final    BOTTLES DRAWN AEROBIC AND ANAEROBIC Blood Culture adequate volume   Culture  Setup Time   Final    GRAM POSITIVE COCCI IN CLUSTERS GRAM POSITIVE COCCI IN CHAINS IN BOTH AEROBIC AND ANAEROBIC BOTTLES CRITICAL RESULT CALLED TO, READ BACK BY AND VERIFIED WITH: PHARMD JEREMY FRENS ON 10/24/23 @ 1502 BY DRT Performed at [facility], [address], [place], [place] [address]    Culture (A)  Final    STAPHYLOCOCCUS EPIDERMIDIS STREPTOCOCCUS MITIS/ORALIS    Report Status 10/26/2023 FINAL  Final   Organism ID, Bacteria STAPHYLOCOCCUS EPIDERMIDIS  Final   Organism ID, Bacteria STREPTOCOCCUS MITIS/ORALIS  Final      Susceptibility   Staphylococcus epidermidis - MIC*    CIPROFLOXACIN 2 INTERMEDIATE Intermediate     ERYTHROMYCIN <=0.25 SENSITIVE Sensitive     GENTAMICIN <=0.5 SENSITIVE Sensitive     OXACILLIN >=4 RESISTANT Resistant     TETRACYCLINE >=16 RESISTANT Resistant     VANCOMYCIN  <=0.5 SENSITIVE Sensitive     TRIMETH/SULFA <=10 SENSITIVE Sensitive     CLINDAMYCIN <=0.25 SENSITIVE Sensitive     RIFAMPIN <=0.5 SENSITIVE Sensitive     Inducible Clindamycin NEGATIVE Sensitive     * STAPHYLOCOCCUS EPIDERMIDIS   Streptococcus mitis/oralis - MIC*    PENICILLIN <=0.06 SENSITIVE Sensitive     CEFTRIAXONE <=0.12 SENSITIVE Sensitive     LEVOFLOXACIN 1 SENSITIVE Sensitive     VANCOMYCIN  0.5 SENSITIVE Sensitive     * STREPTOCOCCUS MITIS/ORALIS  Blood Culture ID Panel (Reflexed)     Status: Abnormal   Collection  Time: 10/23/23  9:07 PM  Result Value Ref Range Status   Enterococcus faecalis NOT DETECTED NOT DETECTED Final   Enterococcus Faecium NOT DETECTED NOT DETECTED Final   Listeria monocytogenes NOT DETECTED NOT DETECTED Final   Staphylococcus species DETECTED (A) NOT DETECTED Final    Comment: CRITICAL RESULT CALLED TO, READ BACK BY AND VERIFIED WITH: PHARMD JEREMY FRENS ON 10/24/23 @ 1502 BY DRT    Staphylococcus aureus (BCID) NOT DETECTED NOT DETECTED Final   Staphylococcus epidermidis DETECTED (A) NOT DETECTED Final    Comment: Methicillin (oxacillin) resistant coagulase negative staphylococcus. Possible blood culture contaminant (unless isolated from more than one blood culture draw or clinical Menton suggests pathogenicity). No antibiotic treatment is indicated for blood  culture contaminants. CRITICAL RESULT CALLED TO, READ BACK BY AND VERIFIED WITH: PHARMD JEREMY FRENS ON 10/24/23 @ 1502 BY DRT    Staphylococcus lugdunensis NOT DETECTED NOT DETECTED Final   Streptococcus species DETECTED (A) NOT DETECTED Final    Comment: Not Enterococcus species, Streptococcus agalactiae, Streptococcus pyogenes, or Streptococcus pneumoniae. CRITICAL RESULT CALLED TO, READ BACK BY  AND VERIFIED WITH: PHARMD JEREMY FRENS ON 10/24/23 @ 1502 BY DRT    Streptococcus agalactiae NOT DETECTED NOT DETECTED Final   Streptococcus pneumoniae NOT DETECTED NOT DETECTED Final   Streptococcus pyogenes NOT DETECTED NOT DETECTED Final   A.calcoaceticus-baumannii NOT DETECTED NOT DETECTED Final   Bacteroides fragilis NOT DETECTED NOT DETECTED Final   Enterobacterales NOT DETECTED NOT DETECTED Final   Enterobacter cloacae complex NOT DETECTED NOT DETECTED Final   Escherichia coli NOT DETECTED NOT DETECTED Final   Klebsiella aerogenes NOT DETECTED NOT DETECTED Final   Klebsiella oxytoca NOT DETECTED NOT DETECTED Final   Klebsiella pneumoniae NOT DETECTED NOT DETECTED Final   Proteus species NOT DETECTED NOT DETECTED Final    Salmonella species NOT DETECTED NOT DETECTED Final   Serratia marcescens NOT DETECTED NOT DETECTED Final   Haemophilus influenzae NOT DETECTED NOT DETECTED Final   Neisseria meningitidis NOT DETECTED NOT DETECTED Final   Pseudomonas aeruginosa NOT DETECTED NOT DETECTED Final   Stenotrophomonas maltophilia NOT DETECTED NOT DETECTED Final   Candida albicans NOT DETECTED NOT DETECTED Final   Candida auris NOT DETECTED NOT DETECTED Final   Candida glabrata NOT DETECTED NOT DETECTED Final   Candida krusei NOT DETECTED NOT DETECTED Final   Candida parapsilosis NOT DETECTED NOT DETECTED Final   Candida tropicalis NOT DETECTED NOT DETECTED Final   Cryptococcus neoformans/gattii NOT DETECTED NOT DETECTED Final   Methicillin resistance mecA/C DETECTED (A) NOT DETECTED Final    Comment: CRITICAL RESULT CALLED TO, READ BACK BY AND VERIFIED WITH: PHARMD JEREMY FRENS ON 10/24/23 @ 1502 BY DRT Performed at [facility], [address], [place], [place] [address]   Urine Culture     Status: None   Collection Time: 10/23/23 11:49 PM   Specimen: Urine, Random  Result Value Ref Range Status   Specimen Description URINE, RANDOM  Final   Special Requests NONE Reflexed from [id number]  Final   Culture   Final    NO GROWTH Performed at [facility], [address] [place], [place] [address]    Report Status 10/24/2023 FINAL  Final  Respiratory (~20 pathogens) panel by PCR     Status: None   Collection Time: 10/24/23  2:02 AM   Specimen: Nasopharyngeal Swab; Respiratory  Result Value Ref Range Status   Adenovirus NOT DETECTED NOT DETECTED Final   Coronavirus 229E NOT DETECTED NOT DETECTED Final    Comment: (NOTE) The Coronavirus on the Respiratory Panel, DOES NOT test for the novel  Coronavirus (2019 nCoV)    Coronavirus HKU1 NOT DETECTED NOT DETECTED Final   Coronavirus NL63 NOT DETECTED NOT DETECTED Final   Coronavirus OC43 NOT DETECTED NOT DETECTED Final   Metapneumovirus NOT DETECTED NOT  DETECTED Final   Rhinovirus / Enterovirus NOT DETECTED NOT DETECTED Final   Influenza A NOT DETECTED NOT DETECTED Final   Influenza B NOT DETECTED NOT DETECTED Final   Parainfluenza Virus 1 NOT DETECTED NOT DETECTED Final   Parainfluenza Virus 2 NOT DETECTED NOT DETECTED Final   Parainfluenza Virus 3 NOT DETECTED NOT DETECTED Final   Parainfluenza Virus 4 NOT DETECTED NOT DETECTED Final   Respiratory Syncytial Virus NOT DETECTED NOT DETECTED Final   Bordetella pertussis NOT DETECTED NOT DETECTED Final   Bordetella Parapertussis NOT DETECTED NOT DETECTED Final   Chlamydophila pneumoniae NOT DETECTED NOT DETECTED Final   Mycoplasma pneumoniae NOT DETECTED NOT DETECTED Final    Comment: Performed at [facility], [address], [place],  [place] [address]  MRSA Next Gen by PCR, Nasal     Status: None   Collection Time: 10/24/23  2:02 AM   Specimen: Nasopharyngeal Swab; Nasal Swab  Result Value Ref Range Status   MRSA by PCR Next Gen NOT DETECTED NOT DETECTED Final    Comment: (NOTE) The GeneXpert MRSA Assay (FDA approved for NASAL specimens only), is one component of a comprehensive MRSA colonization surveillance program. It is not intended to diagnose MRSA infection nor to guide or monitor treatment for MRSA infections. Test performance is not FDA approved in patients less than 63 years old. Performed at [facility], [address], [place], [place] [address]   Culture, blood (Routine X 2) w Reflex to ID Panel     Status: None (Preliminary result)   Collection Time: 10/26/23  6:07 AM   Specimen: BLOOD  Result Value Ref Range Status   Specimen Description BLOOD LEFT ANTECUBITAL  Final   Special Requests   Final    BOTTLES DRAWN AEROBIC ONLY Blood Culture results may not be optimal due to an inadequate volume of blood received in culture bottles   Culture   Final    NO GROWTH < 24 HOURS Performed at [facility], [address], [place], [place] [address]     Report Status PENDING  Incomplete  Culture, blood (Routine X 2) w Reflex to ID Panel     Status: None (Preliminary result)   Collection Time: 10/26/23  6:13 AM   Specimen: BLOOD LEFT HAND  Result Value Ref Range Status   Specimen Description BLOOD LEFT HAND  Final   Special Requests   Final    BOTTLES DRAWN AEROBIC AND ANAEROBIC Blood Culture adequate volume   Culture   Final    NO GROWTH < 24 HOURS Performed at [facility], [address], [place], [place] [address]    Report Status PENDING  Incomplete    Radiology Report ECHOCARDIOGRAM COMPLETE Result Date: 10/26/2023    ECHOCARDIOGRAM REPORT   Patient Name:   TOBIN CADIENTE Platter Date of Exam: 10/26/2023 Medical Rec #:  [id number]     Height:       62.0 in Accession #:    [id number]    Weight:       259.7 lb Date of Birth:  11/08/1950     BSA:          2.136 m Patient Age:    72 years      BP:           154/85 mmHg Patient Gender: F             HR:           81 bpm. Exam Location:  Inpatient Procedure: 2D Echo, Cardiac Doppler and Color Doppler (Both Spectral and Color            Flow Doppler were utilized during procedure). Indications:    CHF  History:        Patient has prior history of Echocardiogram examinations, most                 recent 08/11/2015. COPD; Risk Factors:Hypertension.  Sonographer:    Jeralene Mom Referring Phys: Florie Husband PRASHANT K [facility] IMPRESSIONS  1. Left ventricular ejection fraction, by estimation, is 60 to 65%. The left ventricle has normal function. The left ventricle has no regional wall motion abnormalities. There is moderate concentric left ventricular hypertrophy. Left ventricular diastolic function could  not be evaluated. There is the interventricular septum is flattened in systole, consistent with right ventricular pressure overload.  2. Right ventricular systolic function is normal. The right ventricular size is moderately enlarged. There is moderately elevated pulmonary artery systolic pressure. The estimated  right ventricular systolic pressure is 48.2 mmHg.  3. Right atrial size was mildly dilated.  4. The mitral valve was not well visualized. Trivial mitral valve regurgitation. No evidence of mitral stenosis. Severe mitral annular calcification.  5. The aortic valve is calcified. There is moderate calcification of the aortic valve. There is moderate thickening of the aortic valve. Aortic valve regurgitation is not visualized. Moderate aortic valve stenosis. Aortic valve area, by VTI measures 0.79 cm. Aortic valve mean gradient measures 21.5 mmHg. Aortic valve Vmax measures 3.13 m/s.  6. The inferior vena cava is dilated in size with <50% respiratory variability, suggesting right atrial pressure of 15 mmHg. Comparison(s): Changes from prior study are noted. Aortic stenosis now moderate, RV enlarged with elevated RA and RV pressures but normal function. FINDINGS  Left Ventricle: Left ventricular ejection fraction, by estimation, is 60 to 65%. The left ventricle has normal function. The left ventricle has no regional wall motion abnormalities. The left ventricular internal cavity size was normal in size. There is  moderate concentric left ventricular hypertrophy. The interventricular septum is flattened in systole, consistent with right ventricular pressure overload. Left ventricular diastolic function could not be evaluated due to mitral annular calcification (moderate or greater). Left ventricular diastolic function could not be evaluated. Right Ventricle: The right ventricular size is moderately enlarged. Right vetricular wall thickness was not well visualized. Right ventricular systolic function is normal. There is moderately elevated pulmonary artery systolic pressure. The tricuspid regurgitant velocity is 2.88 m/s, and with an assumed right atrial pressure of 15 mmHg, the estimated right ventricular systolic pressure is 48.2 mmHg. Left Atrium: Left atrial size was normal in size. Right Atrium: Right atrial size was  mildly dilated. Pericardium: Trivial pericardial effusion is present. Mitral Valve: The mitral valve was not well visualized. Severe mitral annular calcification. Trivial mitral valve regurgitation. No evidence of mitral valve stenosis. The mean mitral valve gradient is 2.9 mmHg. Tricuspid Valve: The tricuspid valve is grossly normal. Tricuspid valve regurgitation is mild . No evidence of tricuspid stenosis. Aortic Valve: Concern for low flow low gradient AS. Mean gradient 21 mmHg and DI 0.33, consistent with moderate AS, though AVA is calculated at less than 1. The aortic valve is calcified. There is moderate calcification of the aortic valve. There is moderate thickening of the aortic valve. Aortic valve regurgitation is not visualized. Moderate aortic stenosis is present. Aortic valve mean gradient measures 21.5 mmHg. Aortic valve peak gradient measures 39.3 mmHg. Aortic valve area, by VTI measures 0.79 cm. Pulmonic Valve: The pulmonic valve was not well visualized. Pulmonic valve regurgitation is not visualized. No evidence of pulmonic stenosis. Aorta: The aortic root was not well visualized, the ascending aorta was not well visualized and the aortic arch was not well visualized. Venous: The inferior vena cava is dilated in size with less than 50% respiratory variability, suggesting right atrial pressure of 15 mmHg. IAS/Shunts: The atrial septum is grossly normal. Additional Comments: There is a small pleural effusion in both left and right lateral regions.  LEFT VENTRICLE PLAX 2D LVIDd:         2.40 cm   Diastology LVIDs:         1.60 cm   LV e' medial:  5.77 cm/s LV PW:         1.30 cm   LV E/e' medial:  18.4 LV IVS:        1.30 cm   LV e' lateral:   7.72 cm/s LVOT diam:     1.75 cm   LV E/e' lateral: 13.7 LV SV:         46 LV SV Index:   21 LVOT Area:     2.41 cm  RIGHT VENTRICLE RV Basal diam:  4.71 cm RV S prime:     10.80 cm/s TAPSE (M-mode): 2.0 cm LEFT ATRIUM           Index        RIGHT ATRIUM            Index LA diam:      3.50 cm 1.64 cm/m   RA Area:     16.20 cm LA Vol (A4C): 37.1 ml 17.37 ml/m  RA Volume:   46.30 ml  21.67 ml/m  AORTIC VALVE AV Area (Vmax):    0.78 cm AV Area (Vmean):   0.75 cm AV Area (VTI):     0.79 cm AV Vmax:           313.33 cm/s AV Vmean:          214.000 cm/s AV VTI:            0.581 m AV Peak Grad:      39.3 mmHg AV Mean Grad:      21.5 mmHg LVOT Vmax:         102.00 cm/s LVOT Vmean:        67.000 cm/s LVOT VTI:          0.190 m LVOT/AV VTI ratio: 0.33 MITRAL VALVE                TRICUSPID VALVE MV Area (PHT): 3.72 cm     TR Peak grad:   33.2 mmHg MV Mean grad:  2.9 mmHg     TR Vmax:        288.00 cm/s MV Decel Time: 204 msec MR Peak grad: 52.7 mmHg     SHUNTS MR Vmax:      363.00 cm/s   Systemic VTI:  0.19 m MV E velocity: 106.00 cm/s  Systemic Diam: 1.75 cm MV A velocity: 121.00 cm/s MV E/A ratio:  0.88 Sheryle Donning MD Electronically signed by Sheryle Donning MD Signature Date/Time: 10/26/2023/12:12:27 PM    Final    DG Swallowing Func-Speech Pathology Result Date: 10/25/2023 Table formatting from the original result was not included. Modified Barium Swallow Study Patient Details Name: Charae Depaolis Puller MRN: [id number] Date of Birth: 1951-01-28 Today's Date: 10/25/2023 HPI/PMH: HPI: 73 yo female adm to [facility] with AMS, sepsis - Concern for cellulitis and pna.  She was found covered in feces and maggots.  Pt with PMH + for hiatal hernia, small distal ring, tertiary contractions seen on 2017 UGI study. Pt was in prep for bariatric surgery.  PMH also + for potential bipolar, hoarding, picking at sores and lives alone in a mobile home.  Swallow eval ordered due to pt not passing swallow screen.  Pt admits to issues with dysphagia causing her to cough and sensing food retention in distal pharynx causing her to propel into mouth and reswallow. Clinical Impression: Pt demonstrates a moderate oropharyngeal dysphagia with a DIGEST score of 2. Patient initiates swallow when  liquids have reached the pyriform sinuses. If taking a single  sip, swallow function is otherwise adequate. But if the patient takes a large or sequential sip, the bolus hesitates above the PES and the second propulsive effort and hyoid burst pushes liquid into the airway and up to the nasopharynx. Pt gags, material moves to mouth and pt reswallows. When taking a pill there was significant aspiration of a larger thin bolus eliciting hard coughing. When cued to take one sip at a time from a straw or cup pt protected airway consistently. Observed that pt typically has a posterior tilt to head while swallowing. A more neutral or anterior position will also help. Esophageal sweep not possible due to body habitus. Pt agreeable to a trial of therapy to improve awareness of decreased rate but also wants to continue an unrestricted diet. Will resume regular/thin and f/u.  DIGEST Swallow Severity Rating*             Safety: 2             Efficiency:1             Overall Pharyngeal Swallow Severity: 2 1: mild; 2: moderate; 3: severe; 4: profound *The Dynamic Imaging Grade of Swallowing Toxicity is standardized for the head and neck cancer population, however, demonstrates promising clinical applications across populations to standardize the clinical rating of pharyngeal swallow safety and severity. Factors that may increase risk of adverse event in presence of aspiration Roderick Civatte & Jessy Morocco 2021): Factors that may increase risk of adverse event in presence of aspiration Roderick Civatte & Jessy Morocco 2021): Inadequate oral hygiene; Limited mobility Recommendations/Plan: Swallowing Evaluation Recommendations Swallowing Evaluation Recommendations Liquid Administration via: Cup; Straw Medication Administration: Whole meds with puree Supervision: Patient able to self-feed Swallowing strategies  : Slow rate; Small bites/sips; Minimize environmental distractions Postural changes: Position pt fully upright for meals Oral care recommendations: Oral  care BID (2x/day) Treatment Plan Treatment Plan Follow-up recommendations: Home health SLP Functional status assessment: Patient has had a recent decline in their functional status and demonstrates the ability to make significant improvements in function in a reasonable and predictable amount of time. Treatment frequency: Min 2x/week Treatment duration: 2 weeks Interventions: Aspiration precaution training Recommendations Recommendations for follow up therapy are one component of a multi-disciplinary discharge planning process, led by the attending physician.  Recommendations may be updated based on patient status, additional functional criteria and insurance authorization. Assessment: Orofacial Exam: Orofacial Exam Oral Cavity: Oral Hygiene: Xerostomia Oral Cavity - Dentition: Poor condition Oral Motor/Sensory Function: WFL Anatomy: Anatomy: WFL Boluses Administered: Boluses Administered Boluses Administered: Thin liquids (Level 0); Mildly thick liquids (Level 2, nectar thick); Puree; Solid  Oral Impairment Domain: Oral Impairment Domain Lip Closure: No labial escape Tongue control during bolus hold: Cohesive bolus between tongue to palatal seal Bolus preparation/mastication: Slow prolonged chewing/mashing with complete recollection (missing dentition) Bolus transport/lingual motion: Brisk tongue motion Oral residue: Trace residue lining oral structures Location of oral residue : Tongue Initiation of pharyngeal swallow : Pyriform sinuses  Pharyngeal Impairment Domain: Pharyngeal Impairment Domain Soft palate elevation: Trace column of contrast or air between SP and PW; Escape to nasopharynx Laryngeal elevation: Complete superior movement of thyroid cartilage with complete approximation of arytenoids to epiglottic petiole Anterior hyoid excursion: Complete anterior movement Epiglottic movement: Complete inversion Laryngeal vestibule closure: Incomplete, narrow column air/contrast in laryngeal vestibule Pharyngeal  stripping wave : Present - complete Pharyngoesophageal segment opening: Complete distension and complete duration, no obstruction of flow Tongue base retraction: Trace column of contrast or air between tongue base and PPW Pharyngeal residue:  Trace residue within or on pharyngeal structures Location of pharyngeal residue: Valleculae; Tongue base  Esophageal Impairment Domain: No data recorded Pill: Pill Consistency administered: Thin liquids (Level 0) Thin liquids (Level 0): Impaired (see clinical impressions) Penetration/Aspiration Scale Score: Penetration/Aspiration Scale Score 1.  Material does not enter airway: Thin liquids (Level 0); Mildly thick liquids (Level 2, nectar thick); Puree; Solid 4.  Material enters airway, CONTACTS cords then ejected out: Thin liquids (Level 0) 7.  Material enters airway, passes BELOW cords and not ejected out despite cough attempt by patient: Thin liquids (Level 0) Compensatory Strategies: Compensatory Strategies Compensatory strategies: Yes Oral bolus hold: Effective   General Information: Caregiver present: No  Diet Prior to this Study: NPO   Temperature : Normal   Respiratory Status: WFL   Supplemental O2: None (Room air)   History of Recent Intubation: No  Behavior/Cognition: Alert; Pleasant mood; Cooperative Self-Feeding Abilities: Able to self-feed Baseline vocal quality/speech: Dysphonic Volitional Cough: Able to elicit Volitional Swallow: Able to elicit No data recorded Goal Planning: Prognosis for improved oropharyngeal function: Fair No data recorded No data recorded Patient/Family Stated Goal: wants water  No data recorded Pain: Pain Assessment Pain Assessment: No/denies pain End of Session: Start Time:SLP Start Time (ACUTE ONLY): 1415 Stop Time: SLP Stop Time (ACUTE ONLY): 1440 Time Calculation:SLP Time Calculation (min) (ACUTE ONLY): 25 min Charges: SLP Evaluations $ SLP Speech Visit: 1 Visit SLP Evaluations $BSS Swallow: 1 Procedure $MBS Swallow: 1 Procedure  $Swallowing Treatment: 1 Procedure SLP visit diagnosis: SLP Visit Diagnosis: Dysphagia, oropharyngeal phase (R13.12) Past Medical History: Past Medical History: Diagnosis Date  Anemia   Anxiety   Arthritis   Bipolar disorder (HCC)   Bronchitis   COPD (chronic obstructive pulmonary disease) (HCC)   Depression   Diabetes mellitus   type II  Encounter for blood transfusion   GERD (gastroesophageal reflux disease)   Hearing loss   Hyperlipemia   Hypertension   Nonunion, 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  Peripheral neuropathy   Pneumonia   hx of   Shortness of breath   with exertion   Skin yeast infection   history of under bilateral breast,, pannus, inner legs  Sleep apnea   uses bipap- does not know setting s Past Surgical History: Past Surgical History: Procedure Laterality Date  COLONOSCOPY  10/22/2011  Procedure: COLONOSCOPY;  Surgeon: Almeda Aris, MD;  Location: [facility];  Service: Endoscopy;  Laterality: N/A;  COLONOSCOPY WITH PROPOFOL  N/A 01/21/2017  Procedure: COLONOSCOPY WITH PROPOFOL ;  Surgeon: Alvis Jourdain, MD;  Location: [facility];  Service: Endoscopy;  Laterality: N/A;  ESOPHAGOGASTRODUODENOSCOPY  10/22/2011  Procedure: ESOPHAGOGASTRODUODENOSCOPY (EGD);  Surgeon: Almeda Aris, MD;  Location: [facility];  Service: Endoscopy;  Laterality: N/A;  femur fx    FEMUR IM NAIL Right 01/08/2014  Procedure: INTRAMEDULLARY (IM) NAIL RIGHT HIP WITH INFUSED ALLOGRAFTING;  Surgeon: Arlette Lagos, MD;  Location: [facility];  Service: Orthopedics;  Laterality: Right;  HARDWARE REMOVAL  03/27/2012  Procedure: HARDWARE REMOVAL;  Surgeon: Bevin Bucks, MD;  Location: [facility];  Service: Orthopedics;  Laterality: Right;  HARDWARE REMOVAL Right 01/08/2014  Procedure: HARDWARE REMOVAL RIGHT PROXIMAL FEMUR;  Surgeon: Arlette Lagos, MD;  Location: [facility];  Service: Orthopedics;  Laterality: Right;  HIP FRACTURE SURGERY    INTRAMEDULLARY (IM) NAIL INTERTROCHANTERIC  03/27/2012  Procedure: INTRAMEDULLARY  (IM) NAIL INTERTROCHANTRIC;  Surgeon: Bevin Bucks, MD;  Location: [facility];  Service: Orthopedics;  Laterality: Right;  Possible Exchange with a Shorter Trochantric  Nail  OPEN REDUCTION INTERNAL FIXATION (ORIF) TIBIA/FIBULA FRACTURE Right 06/03/2022  Procedure: OPEN REDUCTION INTERNAL FIXATION (ORIF) OF RIGHT TIBIA;  Surgeon: Hardy Lia, MD;  Location: [facility];  Service: Orthopedics;  Laterality: Right;  TONSILLECTOMY   DeBlois, Hardin Leys 10/25/2023, 3:34 PM    Signature  -   Seena Dadds M.D on 10/27/2023 at 11:02 AM   -  To page go to [email or web address]

## 2023-10-27 NOTE — Progress Notes (Signed)
 [facility]  Date of Admission:  10/23/2023      Total days of antibiotics 3   Vancomycin           ASSESSMENT: [name] [name] is a 73 y.o. female admitted from home following fall and prolonged downtime. Found to have fever, leukocytosis and elevated lactate/CK:    Staphylococcus Epidermidis Bacteremia 2/2 -  Streptococcus Mitis/Oralis Bacteremia 1/2 -  Could still be reflective of contaminants from veinipuncture.  Isolates of the staph epi reviewed and appear to be different strains, favoring a contaminant and not true bacteremia.  Will convert to linezolid to finish out 7 days total to cover for possible superficial cellulitis    Irritant Dermatitis, Intertriginous Candidiasis -  Maggot Infestation -  Skin break down noted in body folds with erythema and white wet drainage to wounds. Will start diflucan 150 mg daily given multiple areas involved. Interdry Ag ordered for folds.  WOC team following for wound care recommendations. Multiple full thickness areas of skin loss but nothing obviously purulent.  Appears dry and improved compared to admission with wound care and antimicrobials.  Continue diflucan - given severity, will extend to 10 days   Rhabdomyolosis, Dehydration, Debility -  Per primary team   ID will sign off - please call back with any questions/concerns or if we can be of further assistance.    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   COPD (chronic obstructive pulmonary disease) (HCC)   Essential hypertension   Non-insulin  dependent type 2 diabetes mellitus (HCC)   Peripheral neuropathy   Fall at home, initial encounter   Cellulitis all over abdomen and thigh  area   Maggot infestation   Acute cystitis   Rhabdomyolysis   History of COPD   Metabolic acidosis   Acute kidney injury superimposed on chronic kidney disease (HCC)   Hyperlipidemia   Transaminitis   Hypothyroidism   Acute hypoxic respiratory failure (HCC)   Acute metabolic encephalopathy    cholecalciferol   2,000 Units Oral Daily   fluconazole  150 mg Oral Daily   furosemide   40 mg Oral Daily   gabapentin   600 mg Oral QHS   heparin  5,000 Units Subcutaneous Q8H   insulin  aspart  0-6 Units Subcutaneous TID WC   leptospermum manuka honey  1 Application Topical Daily   levothyroxine   25 mcg Oral Q0600   linezolid  600 mg Oral Q12H   pantoprazole  (PROTONIX ) IV  40 mg Intravenous Q24H   silver sulfADIAZINE   Topical Daily   sodium chloride  flush  3 mL Intravenous Q12H    SUBJECTIVE: Feeling overall better. Skin is still painful but better, dryer.   Review of Systems: Review of Systems  Constitutional:  Negative for fever.  Gastrointestinal:  Negative for abdominal pain, nausea and vomiting.    Allergies  Allergen Reactions   Coffee Flavoring Agent (Non-Screening) Nausea And Vomiting    Per pt she is allergic to the smell of coffee   Prednisone Other (See Comments)    Tightness in chest   Other Other (See Comments)    Plain nystatin  ointment-makes skin get worse Patient is allergic to any petroleum based ointment  OBJECTIVE: Vitals:   10/26/23 2340 10/27/23 0343 10/27/23 0808 10/27/23 1226  BP: (!) 154/83 (!) 156/76 (!) 160/81 (!) 174/98  Pulse: 89 83 83 78  Resp: (!) 24 20 (!) 23 (!) 24  Temp: 98.5 F (36.9 C) 98.7 F (37.1 C) 97.9 F (36.6 C) 98.5 F (36.9 C)  TempSrc: Oral Axillary Oral Oral  SpO2: 93% 92% 94% 94%  Weight:      Height:       Body mass index is 47.5 kg/m.  Physical Exam Constitutional:      Appearance: She is ill-appearing.  Abdominal:     General: Abdomen is flat. There is no distension.     Tenderness: There is no abdominal  tenderness.   Skin:    General: Skin is warm and dry.     Findings: Rash (multiple skin lesions a/w moisture associated damage and candida. Improving) present.   Neurological:     Mental Status: She is alert and oriented to person, place, and time.     Lab Results Lab Results  Component Value Date   WBC 14.0 (H) 10/27/2023   HGB 10.1 (L) 10/27/2023   HCT 32.0 (L) 10/27/2023   MCV 88.2 10/27/2023   PLT 334 10/27/2023    Lab Results  Component Value Date   CREATININE 1.09 (H) 10/27/2023   BUN 17 10/27/2023   NA 140 10/27/2023   K 4.5 10/27/2023   CL 111 10/27/2023   CO2 20 (L) 10/27/2023    Lab Results  Component Value Date   ALT 39 10/27/2023   AST 45 (H) 10/27/2023   ALKPHOS 45 10/27/2023   BILITOT 0.6 10/27/2023     Microbiology: Recent Results (from the past 240 hours)  Blood Culture (routine x 2)     Status: Abnormal   Collection Time: 10/23/23  9:06 PM   Specimen: BLOOD LEFT WRIST  Result Value Ref Range Status   Specimen Description BLOOD LEFT WRIST  Final   Special Requests   Final    BOTTLES DRAWN AEROBIC AND ANAEROBIC Blood Culture adequate volume   Culture  Setup Time   Final    GRAM POSITIVE COCCI IN BOTH AEROBIC AND ANAEROBIC BOTTLES CRITICAL VALUE NOTED.  VALUE IS CONSISTENT WITH PREVIOUSLY REPORTED AND CALLED VALUE. Performed at [facility], [address], [place], [place] [address]    Culture STAPHYLOCOCCUS EPIDERMIDIS (A)  Final   Report Status 10/27/2023 FINAL  Final   Organism ID, Bacteria STAPHYLOCOCCUS EPIDERMIDIS  Final      Susceptibility   Staphylococcus epidermidis - MIC*    CIPROFLOXACIN <=0.5 SENSITIVE Sensitive     ERYTHROMYCIN <=0.25 SENSITIVE Sensitive     GENTAMICIN <=0.5 SENSITIVE Sensitive     OXACILLIN >=4 RESISTANT Resistant     TETRACYCLINE >=16 RESISTANT Resistant     VANCOMYCIN  1 SENSITIVE Sensitive     TRIMETH/SULFA <=10 SENSITIVE Sensitive     CLINDAMYCIN <=0.25 SENSITIVE Sensitive     RIFAMPIN <=0.5  SENSITIVE Sensitive     Inducible Clindamycin NEGATIVE Sensitive     * STAPHYLOCOCCUS EPIDERMIDIS  Blood Culture (routine x 2)     Status: Abnormal   Collection Time: 10/23/23  9:07 PM   Specimen: BLOOD RIGHT WRIST  Result Value Ref Range Status   Specimen Description BLOOD RIGHT WRIST  Final   Special Requests   Final    BOTTLES DRAWN AEROBIC AND ANAEROBIC Blood Culture adequate volume   Culture  Setup Time   Final  GRAM POSITIVE COCCI IN CLUSTERS GRAM POSITIVE COCCI IN CHAINS IN BOTH AEROBIC AND ANAEROBIC BOTTLES CRITICAL RESULT CALLED TO, READ BACK BY AND VERIFIED WITH: PHARMD JEREMY FRENS ON 10/24/23 @ 1502 BY DRT Performed at [facility], [address], [place], [place] [address]    Culture (A)  Final    STAPHYLOCOCCUS EPIDERMIDIS STREPTOCOCCUS MITIS/ORALIS    Report Status 10/26/2023 FINAL  Final   Organism ID, Bacteria STAPHYLOCOCCUS EPIDERMIDIS  Final   Organism ID, Bacteria STREPTOCOCCUS MITIS/ORALIS  Final      Susceptibility   Staphylococcus epidermidis - MIC*    CIPROFLOXACIN 2 INTERMEDIATE Intermediate     ERYTHROMYCIN <=0.25 SENSITIVE Sensitive     GENTAMICIN <=0.5 SENSITIVE Sensitive     OXACILLIN >=4 RESISTANT Resistant     TETRACYCLINE >=16 RESISTANT Resistant     VANCOMYCIN  <=0.5 SENSITIVE Sensitive     TRIMETH/SULFA <=10 SENSITIVE Sensitive     CLINDAMYCIN <=0.25 SENSITIVE Sensitive     RIFAMPIN <=0.5 SENSITIVE Sensitive     Inducible Clindamycin NEGATIVE Sensitive     * STAPHYLOCOCCUS EPIDERMIDIS   Streptococcus mitis/oralis - MIC*    PENICILLIN <=0.06 SENSITIVE Sensitive     CEFTRIAXONE <=0.12 SENSITIVE Sensitive     LEVOFLOXACIN 1 SENSITIVE Sensitive     VANCOMYCIN  0.5 SENSITIVE Sensitive     * STREPTOCOCCUS MITIS/ORALIS  Blood Culture ID Panel (Reflexed)     Status: Abnormal   Collection Time: 10/23/23  9:07 PM  Result Value Ref Range Status   Enterococcus faecalis NOT DETECTED NOT DETECTED Final   Enterococcus Faecium NOT DETECTED NOT  DETECTED Final   Listeria monocytogenes NOT DETECTED NOT DETECTED Final   Staphylococcus species DETECTED (A) NOT DETECTED Final    Comment: CRITICAL RESULT CALLED TO, READ BACK BY AND VERIFIED WITH: PHARMD JEREMY FRENS ON 10/24/23 @ 1502 BY DRT    Staphylococcus aureus (BCID) NOT DETECTED NOT DETECTED Final   Staphylococcus epidermidis DETECTED (A) NOT DETECTED Final    Comment: Methicillin (oxacillin) resistant coagulase negative staphylococcus. Possible blood culture contaminant (unless isolated from more than one blood culture draw or clinical Adamik suggests pathogenicity). No antibiotic treatment is indicated for blood  culture contaminants. CRITICAL RESULT CALLED TO, READ BACK BY AND VERIFIED WITH: PHARMD JEREMY FRENS ON 10/24/23 @ 1502 BY DRT    Staphylococcus lugdunensis NOT DETECTED NOT DETECTED Final   Streptococcus species DETECTED (A) NOT DETECTED Final    Comment: Not Enterococcus species, Streptococcus agalactiae, Streptococcus pyogenes, or Streptococcus pneumoniae. CRITICAL RESULT CALLED TO, READ BACK BY AND VERIFIED WITH: PHARMD JEREMY FRENS ON 10/24/23 @ 1502 BY DRT    Streptococcus agalactiae NOT DETECTED NOT DETECTED Final   Streptococcus pneumoniae NOT DETECTED NOT DETECTED Final   Streptococcus pyogenes NOT DETECTED NOT DETECTED Final   A.calcoaceticus-baumannii NOT DETECTED NOT DETECTED Final   Bacteroides fragilis NOT DETECTED NOT DETECTED Final   Enterobacterales NOT DETECTED NOT DETECTED Final   Enterobacter cloacae complex NOT DETECTED NOT DETECTED Final   Escherichia coli NOT DETECTED NOT DETECTED Final   Klebsiella aerogenes NOT DETECTED NOT DETECTED Final   Klebsiella oxytoca NOT DETECTED NOT DETECTED Final   Klebsiella pneumoniae NOT DETECTED NOT DETECTED Final   Proteus species NOT DETECTED NOT DETECTED Final   Salmonella species NOT DETECTED NOT DETECTED Final   Serratia marcescens NOT DETECTED NOT DETECTED Final   Haemophilus influenzae NOT DETECTED NOT  DETECTED Final   Neisseria meningitidis NOT DETECTED NOT DETECTED Final   Pseudomonas aeruginosa NOT DETECTED NOT DETECTED Final  Stenotrophomonas maltophilia NOT DETECTED NOT DETECTED Final   Candida albicans NOT DETECTED NOT DETECTED Final   Candida auris NOT DETECTED NOT DETECTED Final   Candida glabrata NOT DETECTED NOT DETECTED Final   Candida krusei NOT DETECTED NOT DETECTED Final   Candida parapsilosis NOT DETECTED NOT DETECTED Final   Candida tropicalis NOT DETECTED NOT DETECTED Final   Cryptococcus neoformans/gattii NOT DETECTED NOT DETECTED Final   Methicillin resistance mecA/C DETECTED (A) NOT DETECTED Final    Comment: CRITICAL RESULT CALLED TO, READ BACK BY AND VERIFIED WITH: PHARMD JEREMY FRENS ON 10/24/23 @ 1502 BY DRT Performed at [facility], [address] [place], [place] [address]   Urine Culture     Status: None   Collection Time: 10/23/23 11:49 PM   Specimen: Urine, Random  Result Value Ref Range Status   Specimen Description URINE, RANDOM  Final   Special Requests NONE Reflexed from [id number]  Final   Culture   Final    NO GROWTH Performed at [facility], [address] [place], [place] [address]    Report Status 10/24/2023 FINAL  Final  Respiratory (~20 pathogens) panel by PCR     Status: None   Collection Time: 10/24/23  2:02 AM   Specimen: Nasopharyngeal Swab; Respiratory  Result Value Ref Range Status   Adenovirus NOT DETECTED NOT DETECTED Final   Coronavirus 229E NOT DETECTED NOT DETECTED Final    Comment: (NOTE) The Coronavirus on the Respiratory Panel, DOES NOT test for the novel  Coronavirus (2019 nCoV)    Coronavirus HKU1 NOT DETECTED NOT DETECTED Final   Coronavirus NL63 NOT DETECTED NOT DETECTED Final   Coronavirus OC43 NOT DETECTED NOT DETECTED Final   Metapneumovirus NOT DETECTED NOT DETECTED Final   Rhinovirus / Enterovirus NOT DETECTED NOT DETECTED Final   Influenza A NOT DETECTED NOT DETECTED Final   Influenza B NOT  DETECTED NOT DETECTED Final   Parainfluenza Virus 1 NOT DETECTED NOT DETECTED Final   Parainfluenza Virus 2 NOT DETECTED NOT DETECTED Final   Parainfluenza Virus 3 NOT DETECTED NOT DETECTED Final   Parainfluenza Virus 4 NOT DETECTED NOT DETECTED Final   Respiratory Syncytial Virus NOT DETECTED NOT DETECTED Final   Bordetella pertussis NOT DETECTED NOT DETECTED Final   Bordetella Parapertussis NOT DETECTED NOT DETECTED Final   Chlamydophila pneumoniae NOT DETECTED NOT DETECTED Final   Mycoplasma pneumoniae NOT DETECTED NOT DETECTED Final    Comment: Performed at [facility], [address] [place], [place] [address]  MRSA Next Gen by PCR, Nasal     Status: None   Collection Time: 10/24/23  2:02 AM   Specimen: Nasopharyngeal Swab; Nasal Swab  Result Value Ref Range Status   MRSA by PCR Next Gen NOT DETECTED NOT DETECTED Final    Comment: (NOTE) The GeneXpert MRSA Assay (FDA approved for NASAL specimens only), is one component of a comprehensive MRSA colonization surveillance program. It is not intended to diagnose MRSA infection nor to guide or monitor treatment for MRSA infections. Test performance is not FDA approved in patients less than 80 years old. Performed at [facility], [address], [place], [place] [address]   Culture, blood (Routine X 2) w Reflex to ID Panel     Status: None (Preliminary result)   Collection Time: 10/26/23  6:07 AM   Specimen: BLOOD  Result Value Ref Range Status   Specimen Description BLOOD LEFT ANTECUBITAL  Final   Special Requests  Final    BOTTLES DRAWN AEROBIC ONLY Blood Culture results may not be optimal due to an inadequate volume of blood received in culture bottles   Culture   Final    NO GROWTH < 24 HOURS Performed at [facility], [address] [place], [place] [address]    Report Status PENDING  Incomplete  Culture, blood (Routine X 2) w Reflex to ID Panel     Status: None (Preliminary result)   Collection Time:  10/26/23  6:13 AM   Specimen: BLOOD LEFT HAND  Result Value Ref Range Status   Specimen Description BLOOD LEFT HAND  Final   Special Requests   Final    BOTTLES DRAWN AEROBIC AND ANAEROBIC Blood Culture adequate volume   Culture   Final    NO GROWTH < 24 HOURS Performed at [facility], [address] [place], [place] [address]    Report Status PENDING  Incomplete    Gibson Kurtz, MSN, NP-C [facility]  [email or web address] Pager: [phone] Office: [phone] [facility] Main Line: [phone] *Secure Chat Communication Welcome

## 2023-10-28 DIAGNOSIS — R6520 Severe sepsis without septic shock: Secondary | ICD-10-CM | POA: Diagnosis not present

## 2023-10-28 DIAGNOSIS — A419 Sepsis, unspecified organism: Secondary | ICD-10-CM | POA: Diagnosis not present

## 2023-10-28 LAB — PROCALCITONIN: Procalcitonin: 0.13 ng/mL

## 2023-10-28 LAB — MAGNESIUM: Magnesium: 1.5 mg/dL — ABNORMAL LOW (ref 1.7–2.4)

## 2023-10-28 LAB — COMPREHENSIVE METABOLIC PANEL WITH GFR
ALT: 34 U/L (ref 0–44)
AST: 34 U/L (ref 15–41)
Albumin: 2.2 g/dL — ABNORMAL LOW (ref 3.5–5.0)
Alkaline Phosphatase: 56 U/L (ref 38–126)
Anion gap: 9 (ref 5–15)
BUN: 17 mg/dL (ref 8–23)
CO2: 23 mmol/L (ref 22–32)
Calcium: 8.7 mg/dL — ABNORMAL LOW (ref 8.9–10.3)
Chloride: 106 mmol/L (ref 98–111)
Creatinine, Ser: 1.08 mg/dL — ABNORMAL HIGH (ref 0.44–1.00)
GFR, Estimated: 54 mL/min — ABNORMAL LOW (ref >60)
Glucose, Bld: 118 mg/dL — ABNORMAL HIGH (ref 70–99)
Potassium: 4.3 mmol/L (ref 3.5–5.1)
Sodium: 138 mmol/L (ref 135–145)
Total Bilirubin: 0.7 mg/dL (ref 0.0–1.2)
Total Protein: 5.2 g/dL — ABNORMAL LOW (ref 6.5–8.1)

## 2023-10-28 LAB — CBC WITH DIFFERENTIAL/PLATELET
Abs Immature Granulocytes: 0 10*3/uL (ref 0.00–0.07)
Basophils Absolute: 0 10*3/uL (ref 0.0–0.1)
Basophils Relative: 0 %
Eosinophils Absolute: 0.9 10*3/uL — ABNORMAL HIGH (ref 0.0–0.5)
Eosinophils Relative: 6 %
HCT: 33.1 % — ABNORMAL LOW (ref 36.0–46.0)
Hemoglobin: 10.3 g/dL — ABNORMAL LOW (ref 12.0–15.0)
Lymphocytes Relative: 14 %
Lymphs Abs: 2.2 10*3/uL (ref 0.7–4.0)
MCH: 27.5 pg (ref 26.0–34.0)
MCHC: 31.1 g/dL (ref 30.0–36.0)
MCV: 88.5 fL (ref 80.0–100.0)
Monocytes Absolute: 1.1 10*3/uL — ABNORMAL HIGH (ref 0.1–1.0)
Monocytes Relative: 7 %
Neutro Abs: 11.3 10*3/uL — ABNORMAL HIGH (ref 1.7–7.7)
Neutrophils Relative %: 73 %
Platelets: 347 10*3/uL (ref 150–400)
RBC: 3.74 MIL/uL — ABNORMAL LOW (ref 3.87–5.11)
RDW: 14.1 % (ref 11.5–15.5)
WBC: 15.5 10*3/uL — ABNORMAL HIGH (ref 4.0–10.5)
nRBC: 0 % (ref 0.0–0.2)
nRBC: 0 /100{WBCs}

## 2023-10-28 LAB — GLUCOSE, CAPILLARY
Glucose-Capillary: 123 mg/dL — ABNORMAL HIGH (ref 70–99)
Glucose-Capillary: 123 mg/dL — ABNORMAL HIGH (ref 70–99)
Glucose-Capillary: 127 mg/dL — ABNORMAL HIGH (ref 70–99)

## 2023-10-28 LAB — AMMONIA: Ammonia: 30 umol/L (ref 9–35)

## 2023-10-28 LAB — BRAIN NATRIURETIC PEPTIDE: B Natriuretic Peptide: 697.6 pg/mL — ABNORMAL HIGH (ref 0.0–100.0)

## 2023-10-28 LAB — PHOSPHORUS: Phosphorus: 3.5 mg/dL (ref 2.5–4.6)

## 2023-10-28 LAB — C-REACTIVE PROTEIN: CRP: 4.8 mg/dL — ABNORMAL HIGH (ref <1.0)

## 2023-10-28 MED ORDER — FUROSEMIDE 10 MG/ML IJ SOLN
40.0000 mg | Freq: Once | INTRAMUSCULAR | Status: AC
  Administered 2023-10-28: 40 mg via INTRAVENOUS
  Filled 2023-10-28: qty 4

## 2023-10-28 MED ORDER — MAGNESIUM SULFATE 2 GM/50ML IV SOLN
2.0000 g | Freq: Once | INTRAVENOUS | Status: AC
  Administered 2023-10-28: 2 g via INTRAVENOUS
  Filled 2023-10-28: qty 50

## 2023-10-28 NOTE — Progress Notes (Signed)
 Physical Therapy Treatment Patient Details Name: [name] [name] MRN: [id number] DOB: 10/18/1950 Today's Date: 10/28/2023   History of Present Illness Patient is 73 y.o. female who presented on 6/8 after being found down during a welfare check. Pt admitted to [facility] with AMS, sepsis from CAP, and cellulitis with maggot infestation. Pt with PMH significant for hiatal hernia, small distal ring, tertiary contractions seen on 2017 UGI study, bipolar disorder, hoarding, COPD, HTN, HLD, DM, hx of femoral neck fx with malunion(stable per ortho on prior admission),GERD, OA, anemia, anxiety/depression.    PT Comments  Patient resting in bed and noted to be soiled with urine. Agreeable to mobilize with therapist. Pt required Total/Max +2 assist with cues to sequence supine>sit EOB. Pt able to maintain balance at EOB with feet supported. Attempted sit<>stand from elevated EOB to bari RW and pt unable to power up and raise trunk with max +2. Stedy utilized and EOB further elevated. Max +2 to power up and bed pad required to clear hips through frame to sit on stedy paddles. Pt dependently transported to recliner and maximove sling in chair under pt. NT notified of need for new purewick and linen change on bed. Pt agreeable to remain OOB for increased upright activity. Will progress as able. Patient will benefit from continued inpatient follow up therapy, <3 hours/day.    If plan is discharge home, recommend the following: Two people to help with walking and/or transfers;Two people to help with bathing/dressing/bathroom;Assistance with cooking/housework;Direct supervision/assist for medications management;Assist for transportation;Help with stairs or ramp for entrance   Can travel by private vehicle     No  Equipment Recommendations  None recommended by PT (defer to next venue)    Recommendations for Other Services       Precautions / Restrictions Precautions Precautions: Fall Recall of  Precautions/Restrictions: Intact Restrictions Weight Bearing Restrictions Per Provider Order: No     Mobility  Bed Mobility Overal bed mobility: Needs Assistance Bed Mobility: Rolling, Supine to Sit Rolling: Max assist   Supine to sit: +2 for safety/equipment, +2 for physical assistance, Max assist     General bed mobility comments: Max +2 with pt making attempt to reach for bed rail and Max +2 with bed pad required to pivot hips and raise trunk.    Transfers Overall transfer level: Needs assistance Equipment used: Rolling walker (2 wheels), Ambulation equipment used (bari walker) Transfers: Sit to/from Stand, Bed to chair/wheelchair/BSC Sit to Stand: Via lift equipment, Max assist, +2 physical assistance, +2 safety/equipment, From elevated surface           General transfer comment: Max +2 from elevated EOB to attempt power up to bari RW, pt unable to stand on 2 attempts and Stedy utilized instead. Max+2 for power up and pad utilized to lift and clear Stedy paddles for dependent transfer bed>chair. Maximove pad in chair for RN/NT staff to assist pt back to bed. Transfer via Lift Equipment: Stedy  Ambulation/Gait                   Stairs             Wheelchair Mobility     Tilt Bed    Modified Rankin (Stroke Patients Only)       Balance Overall balance assessment: Needs assistance Sitting-balance support: Feet supported Sitting balance-Leahy Scale: Fair     Standing balance support: Bilateral upper extremity supported, Reliant on assistive device for balance Standing balance-Leahy Scale: Zero Standing balance comment: total assist with  Stedy and +2 for safety                            Communication Communication Communication: No apparent difficulties  Cognition Arousal: Alert Behavior During Therapy: WFL for tasks assessed/performed   PT - Cognitive impairments: No family/caregiver present to determine baseline                          Following commands: Intact      Cueing Cueing Techniques: Verbal cues  Exercises      General Comments        Pertinent Vitals/Pain Pain Assessment Pain Assessment: Faces Faces Pain Scale: Hurts little more Pain Location: all over skin, left arm and knee Pain Descriptors / Indicators: Aching, Constant, Discomfort, Sore Pain Intervention(s): Limited activity within patient's tolerance, Monitored during session, Repositioned    Home Living                          Prior Function            PT Goals (current goals can now be found in the care plan section) Acute Rehab PT Goals Patient Stated Goal: get back home, but no real goals stated by pt PT Goal Formulation: With patient Time For Goal Achievement: 11/08/23 Potential to Achieve Goals: Fair Progress towards PT goals: Progressing toward goals    Frequency    Min 2X/week      PT Plan      Co-evaluation              AM-PAC PT 6 Clicks Mobility   Outcome Measure  Help needed turning from your back to your side while in a flat bed without using bedrails?: Total Help needed moving from lying on your back to sitting on the side of a flat bed without using bedrails?: Total Help needed moving to and from a bed to a chair (including a wheelchair)?: Total Help needed standing up from a chair using your arms (e.g., wheelchair or bedside chair)?: Total Help needed to walk in hospital room?: Total Help needed climbing 3-5 steps with a railing? : Total 6 Click Score: 6    End of Session Equipment Utilized During Treatment: Gait belt Activity Tolerance: Patient tolerated treatment well Patient left: in chair;with call bell/phone within reach;with chair alarm set Nurse Communication: Mobility status;Need for lift equipment (+2 w/ maximove) PT Visit Diagnosis: Other abnormalities of gait and mobility (R26.89);Muscle weakness (generalized) (M62.81);Difficulty in walking, not elsewhere  classified (R26.2);History of falling (Z91.81)     Time: 1610-9604 PT Time Calculation (min) (ACUTE ONLY): 39 min  Charges:    $Therapeutic Activity: 38-52 mins PT General Charges $$ ACUTE PT VISIT: 1 Visit                     Tish Forge, DPT Acute Rehabilitation Services Office [phone]  10/28/23 12:45 PM

## 2023-10-28 NOTE — Progress Notes (Addendum)
 PROGRESS NOTE                                                                                                                                                                                                             Patient Demographics:    [name] [name], is a 73 y.o. female, DOB - [birth year], EAV:[id number]  Outpatient Primary MD for the patient is Lory Rough., PA-C    LOS - 4  Admit date - 10/23/2023    Chief Complaint  Patient presents with   Code Sepsis       Brief Narrative (HPI from H&P)      73 y.o. female with medical history significant of COPD, essential hypertension, hyperlipidemia, DM type II, peripheral neuropathy, chronic right femoral fracture, right proximal tibial fracture status post ORIF, chronic constipation, chronic depressive disorder, bipolar disorder, hypothyroidism, hoarding behavior, vitamin D  deficiency, and morbid obesity presented emergency department via EMS as being called out for a welfare check.  EMS found the patient on the ground in her own urine and feces with maggots crawling on her seen by EMS.  Unknown downtime and fall.  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   Subjective:    Maleaha Gerken today with no significant events overnight, she reports she has good appetite, does appear she is having a lot of fluid intake.  I have instructed her to drink only when she is thirsty.    Assessment  & Plan :    Severe sepsis POA CAP Bacteremia Pressure ulcer with surrounding cellulitis. - Severe sepsis present on admission. - Sepsis related to pneumonia, and wounds with surrounding cellulitis infested with Magots. - As well blood culture and positive for staph, multiple species.  ID input greatly appreciated, possible contamination.   - ID input greatly appreciated, biotics management per ID, treated with IV vancomycin , transition to Zyvox  - With significant skin  breakdown, pressure ulcers, and wounds noted in body folds with erythema and with drainage for wounds, so started on Diflucan .  Bacteremia Staphylococcus Epidermidis Bacteremia 2/2 -  Streptococcus Mitis/Oralis Bacteremia 1/2 -  - Please see above discussion, patient switched to linezolid  to finish 7 days.  Acute hypoxic respiratory failure -Was felt secondary to pneumonia, encouraged use incentive spirometry and flutter valve.   Maggot infestation -Patient found to be buried  in Maggots all over her body.  It has been cleaned while in the ED.  Hypomagnesemia -Replaced   Acute kidney injury superimposed CKD stage IIIb Rhabdomyolysis -AKI most likely in the setting of dehydration, and rhabdomyolysis from being on the floor for couple days. -Improving with IV fluids, continue to monitor total CK.  Acute metabolic encephalopathy-due to dehydration and sepsis. - CT head and C-spine nonacute, no headache or focal deficits, mentation improving, continue supportive care, minimize narcotics and benzodiazepines, stable TSH, B12, RPR and ammonia levels.    Mild transaminitis Hyperbilirubinemia -Transaminitis and hyperbilirubinemia in the setting of dehydration and rhabdomyolysis.  No right upper quadrant tenderness, negative acute hepatitis panel trend is improving.  Monitor.     History of femoral neck fracture Chronic pain syndrome Supportive care, CT noted, discussed with orthopedics PA Umberto Ganong, CT finding of loose hardware is chronic.  Nothing to be done.   Hypophosphatemia Hypomagnesemia - replaced  Anemia of chronic disease -Stable H&H.   Essential hypertension -Hold blood pressure medication in the setting of sepsis and hypotension.   Generalized anxiety disorder Major depressive disorder Bipolar disorder -Will resume home benzos now mentation has improved   Hypothyroidism -Continue  levothyroxine    Peripheral neuropathy -Mentation has improved resume home  gabapentin    History of COPD  Acute hypoxic respiratory failure in setting of pneumonia -Continue  supplemental oxygen and check pulse ox. -Continue DuoNeb as needed.  Non-insulin -dependent DM type II currently has hypoglycemia likely due to prolonged starvation and downtime -Continue check POC blood glucose in the setting of altered mental status and NPO.[name]  Holding metformin , TSH stable, check random cortisol, D5 drip for now  Lab Results  Component Value Date   HGBA1C 5.9 (H) 10/25/2023   CBG (last 3)  Recent Labs    10/27/23 1632 10/27/23 2129 10/28/23 0823  GLUCAP 119* 134* 123*    Pressure ulcers: - Is from being on the floor for extended period of time -Continue with wound care Pressure Injury 10/24/23 Face Left Deep Tissue Pressure Injury - Purple or maroon localized area of discolored intact skin or blood-filled blister due to damage of underlying soft tissue from pressure and/or shear. (Active)  10/24/23   Location: Face  Location Orientation: Left  Staging: Deep Tissue Pressure Injury - Purple or maroon localized area of discolored intact skin or blood-filled blister due to damage of underlying soft tissue from pressure and/or shear.  Wound Description (Comments):   DO NOT USE:  Present on Admission: Yes     Pressure Injury 10/24/23 Breast Left Unstageable - Full thickness tissue loss in which the base of the injury is covered by slough (yellow, tan, gray, green or brown) and/or eschar (tan, brown or black) in the wound bed. (Active)  10/24/23   Location: Breast  Location Orientation: Left  Staging: Unstageable - Full thickness tissue loss in which the base of the injury is covered by slough (yellow, tan, gray, green or brown) and/or eschar (tan, brown or black) in the wound bed.  Wound Description (Comments):   DO NOT USE:  Present on Admission: Yes     Pressure Injury 10/24/23 Abdomen Lower;Left Unstageable - Full thickness tissue loss in which the base of the  injury is covered by slough (yellow, tan, gray, green or brown) and/or eschar (tan, brown or black) in the wound bed. (Active)  10/24/23   Location: Abdomen  Location Orientation: Lower;Left  Staging: Unstageable - Full thickness tissue loss in which the base of the injury  is covered by slough (yellow, tan, gray, green or brown) and/or eschar (tan, brown or black) in the wound bed.  Wound Description (Comments):   DO NOT USE:  Present on Admission: Yes     Pressure Injury 10/24/23 Knee Right;Left Deep Tissue Pressure Injury - Purple or maroon localized area of discolored intact skin or blood-filled blister due to damage of underlying soft tissue from pressure and/or shear. (Active)  10/24/23   Location: Knee  Location Orientation: Right;Left  Staging: Deep Tissue Pressure Injury - Purple or maroon localized area of discolored intact skin or blood-filled blister due to damage of underlying soft tissue from pressure and/or shear.  Wound Description (Comments):   DO NOT USE:  Present on Admission: Yes     Pressure Injury 10/24/23 Toe (Comment  which one) Right;Left;Anterior Deep Tissue Pressure Injury - Purple or maroon localized area of discolored intact skin or blood-filled blister due to damage of underlying soft tissue from pressure and/or shear. (Active)  10/24/23   Location: Toe (Comment  which one)  Location Orientation: Right;Left;Anterior  Staging: Deep Tissue Pressure Injury - Purple or maroon localized area of discolored intact skin or blood-filled blister due to damage of underlying soft tissue from pressure and/or shear.  Wound Description (Comments):   DO NOT USE:  Present on Admission: Yes     Pressure Injury 10/24/23 Hand Left Deep Tissue Pressure Injury - Purple or maroon localized area of discolored intact skin or blood-filled blister due to damage of underlying soft tissue from pressure and/or shear. (Active)  10/24/23   Location: Hand  Location Orientation: Left  Staging:  Deep Tissue Pressure Injury - Purple or maroon localized area of discolored intact skin or blood-filled blister due to damage of underlying soft tissue from pressure and/or shear.  Wound Description (Comments):   DO NOT USE:  Present on Admission: Yes          Condition - Extremely Guarded  Family Communication  : None present  Code Status : DNR  Consults  : None  PUD Prophylaxis : PPI   Procedures  :     CT chest abdomen and pelvis- 1. Geographic ground-glass opacities and interlobular septal thickening in the upper lobes. Distribution favors atypical infection. Edema not excluded. Small right and trace left pleural effusions. 2. Distended gallbladder with large gallstones in the gallbladder neck. No definite gallbladder wall thickening or pericholecystic fluid noting limitations of motion. If there is concern for acute cholecystitis, right upper quadrant ultrasound is recommended. 3. Loosening about the right femur hardware. 4. Aortic Atherosclerosis   CT head C-spine.  Nonacute.      Disposition Plan  :    Status is: Inpatient   DVT Prophylaxis  :    heparin  injection 5,000 Units Start: 10/24/23 0600 SCDs Start: 10/24/23 0056 Place TED hose Start: 10/24/23 0056     Lab Results  Component Value Date   PLT 347 10/28/2023    Diet :  Diet Order             Diet regular Room service appropriate? Yes with Assist; Fluid consistency: Thin  Diet effective now                    Inpatient Medications  Scheduled Meds:  cholecalciferol   2,000 Units Oral Daily   fluconazole   150 mg Oral Daily   furosemide   40 mg Oral Daily   gabapentin   600 mg Oral QHS   heparin   5,000 Units Subcutaneous Q8H  insulin  aspart  0-6 Units Subcutaneous TID WC   leptospermum manuka honey  1 Application Topical Daily   levothyroxine   25 mcg Oral Q0600   linezolid   600 mg Oral Q12H   pantoprazole  (PROTONIX ) IV  40 mg Intravenous Q24H   silver  sulfADIAZINE    Topical Daily    sodium chloride  flush  3 mL Intravenous Q12H   Continuous Infusions:   PRN Meds:.clorazepate , dextrose , hydrALAZINE , ipratropium-albuterol , ondansetron  **OR** ondansetron  (ZOFRAN ) IV, senna-docusate    Objective:   Vitals:   10/27/23 0808 10/27/23 1226 10/27/23 1633 10/28/23 0823  BP: (!) 160/81 (!) 174/98 (!) 158/84 (!) 144/71  Pulse: 83 78 81 82  Resp: (!) 23 (!) 24 17 20   Temp: 97.9 F (36.6 C) 98.5 F (36.9 C) 98.7 F (37.1 C) 97.9 F (36.6 C)  TempSrc: Oral Oral Oral Oral  SpO2: 94% 94% 96% 94%  Weight:      Height:        Wt Readings from Last 3 Encounters:  10/23/23 117.8 kg  07/09/22 121.6 kg  05/30/22 (!) 136.1 kg     Intake/Output Summary (Last 24 hours) at 10/28/2023 1055 Last data filed at 10/28/2023 0830 Gross per 24 hour  Intake --  Output 3200 ml  Net -3200 ml     Physical Exam   Awake Alert, frail, deconditioned Symmetrical Chest wall movement, Good air movement bilaterally, CTAB RRR,No Gallops,Rubs or new Murmurs, No Parasternal Heave +ve B.Sounds, Abd Soft, No tenderness, No rebound - guarding or rigidity. No Cyanosis, + 1 edema.       RN pressure injury documentation: Pressure Injury 10/24/23 Face Left Deep Tissue Pressure Injury - Purple or maroon localized area of discolored intact skin or blood-filled blister due to damage of underlying soft tissue from pressure and/or shear. (Active)  10/24/23   Location: Face  Location Orientation: Left  Staging: Deep Tissue Pressure Injury - Purple or maroon localized area of discolored intact skin or blood-filled blister due to damage of underlying soft tissue from pressure and/or shear.  Wound Description (Comments):   DO NOT USE:  Present on Admission: Yes  Dressing Type Foam - Lift dressing to assess site every shift 10/27/23 2000     Pressure Injury 10/24/23 Breast Left Unstageable - Full thickness tissue loss in which the base of the injury is covered by slough (yellow, tan, gray, green or  brown) and/or eschar (tan, brown or black) in the wound bed. (Active)  10/24/23   Location: Breast  Location Orientation: Left  Staging: Unstageable - Full thickness tissue loss in which the base of the injury is covered by slough (yellow, tan, gray, green or brown) and/or eschar (tan, brown or black) in the wound bed.  Wound Description (Comments):   DO NOT USE:  Present on Admission: Yes  Dressing Type Foam - Lift dressing to assess site every shift 10/27/23 2000     Pressure Injury 10/24/23 Abdomen Lower;Left Unstageable - Full thickness tissue loss in which the base of the injury is covered by slough (yellow, tan, gray, green or brown) and/or eschar (tan, brown or black) in the wound bed. (Active)  10/24/23   Location: Abdomen  Location Orientation: Lower;Left  Staging: Unstageable - Full thickness tissue loss in which the base of the injury is covered by slough (yellow, tan, gray, green or brown) and/or eschar (tan, brown or black) in the wound bed.  Wound Description (Comments):   DO NOT USE:  Present on Admission: Yes  Dressing Type Foam -  Lift dressing to assess site every shift 10/27/23 2000     Pressure Injury 10/24/23 Knee Right;Left Deep Tissue Pressure Injury - Purple or maroon localized area of discolored intact skin or blood-filled blister due to damage of underlying soft tissue from pressure and/or shear. (Active)  10/24/23   Location: Knee  Location Orientation: Right;Left  Staging: Deep Tissue Pressure Injury - Purple or maroon localized area of discolored intact skin or blood-filled blister due to damage of underlying soft tissue from pressure and/or shear.  Wound Description (Comments):   DO NOT USE:  Present on Admission: Yes  Dressing Type Foam - Lift dressing to assess site every shift 10/27/23 2000     Pressure Injury 10/24/23 Toe (Comment  which one) Right;Left;Anterior Deep Tissue Pressure Injury - Purple or maroon localized area of discolored intact skin or  blood-filled blister due to damage of underlying soft tissue from pressure and/or shear. (Active)  10/24/23   Location: Toe (Comment  which one)  Location Orientation: Right;Left;Anterior  Staging: Deep Tissue Pressure Injury - Purple or maroon localized area of discolored intact skin or blood-filled blister due to damage of underlying soft tissue from pressure and/or shear.  Wound Description (Comments):   DO NOT USE:  Present on Admission: Yes  Dressing Type Foam - Lift dressing to assess site every shift 10/27/23 2000     Pressure Injury 10/24/23 Hand Left Deep Tissue Pressure Injury - Purple or maroon localized area of discolored intact skin or blood-filled blister due to damage of underlying soft tissue from pressure and/or shear. (Active)  10/24/23   Location: Hand  Location Orientation: Left  Staging: Deep Tissue Pressure Injury - Purple or maroon localized area of discolored intact skin or blood-filled blister due to damage of underlying soft tissue from pressure and/or shear.  Wound Description (Comments):   DO NOT USE:  Present on Admission: Yes  Dressing Type Foam - Lift dressing to assess site every shift 10/27/23 2000      Data Review:    Recent Labs  Lab 10/23/23 2107 10/24/23 0202 10/25/23 0524 10/26/23 0613 10/27/23 0737 10/28/23 0716  WBC 26.7* 22.0* 14.4* 11.6* 14.0* 15.5*  HGB 12.8 11.2* 9.8* 9.9* 10.1* 10.3*  HCT 40.9 36.5 32.5* 32.4* 32.0* 33.1*  PLT 471* 388 295 286 334 347  MCV 88.3 90.8 90.5 89.3 88.2 88.5  MCH 27.6 27.9 27.3 27.3 27.8 27.5  MCHC 31.3 30.7 30.2 30.6 31.6 31.1  RDW 13.6 13.7 13.9 14.0 14.0 14.1  LYMPHSABS 1.2  --  1.5 1.7 2.4 2.2  MONOABS 1.9*  --  0.7 0.6 0.4 1.1*  EOSABS 0.0  --  0.9* 1.0* 0.6* 0.9*  BASOSABS 0.1  --  0.0 0.1 0.1 0.0    Recent Labs  Lab 10/23/23 2107 10/23/23 2113 10/23/23 2321 10/24/23 0202 10/24/23 0700 10/24/23 0954 10/24/23 1948 10/25/23 0524 10/25/23 0536 10/25/23 2012 10/26/23 0613 10/27/23 0737  10/28/23 0716  NA 141  --   --  143  --   --   --  145  --   --  142 140 138  K 4.2  --   --  4.0  --   --   --  3.8  --   --  3.7 4.5 4.3  CL 109  --   --  112*  --   --   --  114*  --   --  113* 111 106  CO2 17*  --   --  19*  --   --   --  19*  --   --  23 20* 23  ANIONGAP 15  --   --  12  --   --   --  12  --   --  6 9 9   GLUCOSE 144*  --   --  139*  --   --   --  93  --   --  113* 112* 118*  BUN 39*  --   --  37*  --   --   --  29*  --   --  25* 17 17  CREATININE 1.87*  --   --  1.59*  --   --   --  1.10*  --   --  1.10* 1.09* 1.08*  AST 129*  --   --  104*  --   --   --  79*  --   --  77* 45* 34  ALT 48*  --   --  44  --   --   --  41  --   --  48* 39 34  ALKPHOS 61  --   --  47  --   --   --  37*  --   --  52 45 56  BILITOT 1.3*  --   --  1.0  --   --   --  0.7  --   --  0.7 0.6 0.7  ALBUMIN  2.2*  --   --  1.9*  --   --   --  2.4*  --   --  2.3* 2.1* 2.2*  CRP  --   --   --   --   --   --   --   --  15.5*  --  11.0* 6.7* 4.8*  DDIMER 8.69*  --   --   --   --   --   --   --   --   --   --   --   --   PROCALCITON  --   --   --  1.11  --   --   --   --  0.63  --  0.37 0.22 0.13  LATICACIDVEN  --    < > 2.4* 2.6* >9.0* 4.8* 2.1*  --   --   --   --   --   --   INR 1.2  --   --   --   --   --   --   --   --   --   --   --   --   TSH  --   --   --  1.671  --   --   --   --  4.504*  --   --   --   --   HGBA1C  --   --   --   --   --   --   --   --   --  5.9*  --   --   --   AMMONIA  --   --   --  16  --   --   --   --  22  --  15 30 30   BNP  --   --   --   --   --   --   --   --  558.3*  --  653.6* 796.6* 697.6*  MG  --   --   --   --   --   --   --   --  1.7  --  1.5* 1.7 1.5*  PHOS  --   --   --   --   --   --   --   --  2.4*  --  3.4 2.9 3.5  CALCIUM 8.8*  --   --  8.4*  --   --   --  8.5*  --   --  8.6* 8.4* 8.7*   < > = values in this interval not displayed.      Recent Labs  Lab 10/23/23 2107 10/23/23 2113 10/23/23 2321 10/24/23 0202 10/24/23 0700 10/24/23 0954  10/24/23 1948 10/25/23 0524 10/25/23 0536 10/25/23 2012 10/26/23 7829 10/27/23 0737 10/28/23 0716  CRP  --   --   --   --   --   --   --   --  15.5*  --  11.0* 6.7* 4.8*  DDIMER 8.69*  --   --   --   --   --   --   --   --   --   --   --   --   PROCALCITON  --   --   --  1.11  --   --   --   --  0.63  --  0.37 0.22 0.13  LATICACIDVEN  --    < > 2.4* 2.6* >9.0* 4.8* 2.1*  --   --   --   --   --   --   INR 1.2  --   --   --   --   --   --   --   --   --   --   --   --   TSH  --   --   --  1.671  --   --   --   --  4.504*  --   --   --   --   HGBA1C  --   --   --   --   --   --   --   --   --  5.9*  --   --   --   AMMONIA  --   --   --  16  --   --   --   --  22  --  15 30 30   BNP  --   --   --   --   --   --   --   --  558.3*  --  653.6* 796.6* 697.6*  MG  --   --   --   --   --   --   --   --  1.7  --  1.5* 1.7 1.5*  CALCIUM 8.8*  --   --  8.4*  --   --   --  8.5*  --   --  8.6* 8.4* 8.7*   < > = values in this interval not displayed.    --------------------------------------------------------------------------------------------------------------- No results found for: CHOL, HDL, LDLCALC, LDLDIRECT, TRIG, CHOLHDL  Lab Results  Component Value Date   HGBA1C 5.9 (H) 10/25/2023   No results for input(s): TSH, T4TOTAL, FREET4, T3FREE, THYROIDAB in the last 72 hours.  No results for input(s): VITAMINB12, FOLATE, FERRITIN, TIBC, IRON, RETICCTPCT in the last 72 hours.  ------------------------------------------------------------------------------------------------------------------ Cardiac Enzymes No results for input(s): CKMB, TROPONINI, MYOGLOBIN in the last 168 hours.  Invalid input(s): CK  Micro Results Recent Results (from the past 240 hours)  Blood Culture (routine x 2)  Status: Abnormal   Collection Time: 10/23/23  9:06 PM   Specimen: BLOOD LEFT WRIST  Result Value Ref Range Status   Specimen Description BLOOD LEFT WRIST  Final    Special Requests   Final    BOTTLES DRAWN AEROBIC AND ANAEROBIC Blood Culture adequate volume   Culture  Setup Time   Final    GRAM POSITIVE COCCI IN BOTH AEROBIC AND ANAEROBIC BOTTLES CRITICAL VALUE NOTED.  VALUE IS CONSISTENT WITH PREVIOUSLY REPORTED AND CALLED VALUE. Performed at [facility], [address], [place], [place] [address]    Culture STAPHYLOCOCCUS EPIDERMIDIS (A)  Final   Report Status 10/27/2023 FINAL  Final   Organism ID, Bacteria STAPHYLOCOCCUS EPIDERMIDIS  Final      Susceptibility   Staphylococcus epidermidis - MIC*    CIPROFLOXACIN <=0.5 SENSITIVE Sensitive     ERYTHROMYCIN <=0.25 SENSITIVE Sensitive     GENTAMICIN <=0.5 SENSITIVE Sensitive     OXACILLIN >=4 RESISTANT Resistant     TETRACYCLINE >=16 RESISTANT Resistant     VANCOMYCIN  1 SENSITIVE Sensitive     TRIMETH/SULFA <=10 SENSITIVE Sensitive     CLINDAMYCIN <=0.25 SENSITIVE Sensitive     RIFAMPIN <=0.5 SENSITIVE Sensitive     Inducible Clindamycin NEGATIVE Sensitive     * STAPHYLOCOCCUS EPIDERMIDIS  Blood Culture (routine x 2)     Status: Abnormal   Collection Time: 10/23/23  9:07 PM   Specimen: BLOOD RIGHT WRIST  Result Value Ref Range Status   Specimen Description BLOOD RIGHT WRIST  Final   Special Requests   Final    BOTTLES DRAWN AEROBIC AND ANAEROBIC Blood Culture adequate volume   Culture  Setup Time   Final    GRAM POSITIVE COCCI IN CLUSTERS GRAM POSITIVE COCCI IN CHAINS IN BOTH AEROBIC AND ANAEROBIC BOTTLES CRITICAL RESULT CALLED TO, READ BACK BY AND VERIFIED WITH: PHARMD JEREMY FRENS ON 10/24/23 @ 1502 BY DRT Performed at [facility], [address] [place], [place] [address]    Culture (A)  Final    STAPHYLOCOCCUS EPIDERMIDIS STREPTOCOCCUS MITIS/ORALIS    Report Status 10/26/2023 FINAL  Final   Organism ID, Bacteria STAPHYLOCOCCUS EPIDERMIDIS  Final   Organism ID, Bacteria STREPTOCOCCUS MITIS/ORALIS  Final      Susceptibility   Staphylococcus epidermidis - MIC*     CIPROFLOXACIN 2 INTERMEDIATE Intermediate     ERYTHROMYCIN <=0.25 SENSITIVE Sensitive     GENTAMICIN <=0.5 SENSITIVE Sensitive     OXACILLIN >=4 RESISTANT Resistant     TETRACYCLINE >=16 RESISTANT Resistant     VANCOMYCIN  <=0.5 SENSITIVE Sensitive     TRIMETH/SULFA <=10 SENSITIVE Sensitive     CLINDAMYCIN <=0.25 SENSITIVE Sensitive     RIFAMPIN <=0.5 SENSITIVE Sensitive     Inducible Clindamycin NEGATIVE Sensitive     * STAPHYLOCOCCUS EPIDERMIDIS   Streptococcus mitis/oralis - MIC*    PENICILLIN <=0.06 SENSITIVE Sensitive     CEFTRIAXONE <=0.12 SENSITIVE Sensitive     LEVOFLOXACIN 1 SENSITIVE Sensitive     VANCOMYCIN  0.5 SENSITIVE Sensitive     * STREPTOCOCCUS MITIS/ORALIS  Blood Culture ID Panel (Reflexed)     Status: Abnormal   Collection Time: 10/23/23  9:07 PM  Result Value Ref Range Status   Enterococcus faecalis NOT DETECTED NOT DETECTED Final   Enterococcus Faecium NOT DETECTED NOT DETECTED Final   Listeria monocytogenes NOT DETECTED NOT DETECTED Final   Staphylococcus species DETECTED (A) NOT DETECTED Final    Comment: CRITICAL RESULT CALLED TO, READ BACK BY AND  VERIFIED WITH: PHARMD JEREMY FRENS ON 10/24/23 @ 1502 BY DRT    Staphylococcus aureus (BCID) NOT DETECTED NOT DETECTED Final   Staphylococcus epidermidis DETECTED (A) NOT DETECTED Final    Comment: Methicillin (oxacillin) resistant coagulase negative staphylococcus. Possible blood culture contaminant (unless isolated from more than one blood culture draw or clinical Hyslop suggests pathogenicity). No antibiotic treatment is indicated for blood  culture contaminants. CRITICAL RESULT CALLED TO, READ BACK BY AND VERIFIED WITH: PHARMD JEREMY FRENS ON 10/24/23 @ 1502 BY DRT    Staphylococcus lugdunensis NOT DETECTED NOT DETECTED Final   Streptococcus species DETECTED (A) NOT DETECTED Final    Comment: Not Enterococcus species, Streptococcus agalactiae, Streptococcus pyogenes, or Streptococcus pneumoniae. CRITICAL RESULT  CALLED TO, READ BACK BY AND VERIFIED WITH: PHARMD JEREMY FRENS ON 10/24/23 @ 1502 BY DRT    Streptococcus agalactiae NOT DETECTED NOT DETECTED Final   Streptococcus pneumoniae NOT DETECTED NOT DETECTED Final   Streptococcus pyogenes NOT DETECTED NOT DETECTED Final   A.calcoaceticus-baumannii NOT DETECTED NOT DETECTED Final   Bacteroides fragilis NOT DETECTED NOT DETECTED Final   Enterobacterales NOT DETECTED NOT DETECTED Final   Enterobacter cloacae complex NOT DETECTED NOT DETECTED Final   Escherichia coli NOT DETECTED NOT DETECTED Final   Klebsiella aerogenes NOT DETECTED NOT DETECTED Final   Klebsiella oxytoca NOT DETECTED NOT DETECTED Final   Klebsiella pneumoniae NOT DETECTED NOT DETECTED Final   Proteus species NOT DETECTED NOT DETECTED Final   Salmonella species NOT DETECTED NOT DETECTED Final   Serratia marcescens NOT DETECTED NOT DETECTED Final   Haemophilus influenzae NOT DETECTED NOT DETECTED Final   Neisseria meningitidis NOT DETECTED NOT DETECTED Final   Pseudomonas aeruginosa NOT DETECTED NOT DETECTED Final   Stenotrophomonas maltophilia NOT DETECTED NOT DETECTED Final   Candida albicans NOT DETECTED NOT DETECTED Final   Candida auris NOT DETECTED NOT DETECTED Final   Candida glabrata NOT DETECTED NOT DETECTED Final   Candida krusei NOT DETECTED NOT DETECTED Final   Candida parapsilosis NOT DETECTED NOT DETECTED Final   Candida tropicalis NOT DETECTED NOT DETECTED Final   Cryptococcus neoformans/gattii NOT DETECTED NOT DETECTED Final   Methicillin resistance mecA/C DETECTED (A) NOT DETECTED Final    Comment: CRITICAL RESULT CALLED TO, READ BACK BY AND VERIFIED WITH: PHARMD JEREMY FRENS ON 10/24/23 @ 1502 BY DRT Performed at [facility], [address] [place], [place] [address]   Urine Culture     Status: None   Collection Time: 10/23/23 11:49 PM   Specimen: Urine, Random  Result Value Ref Range Status   Specimen Description URINE, RANDOM  Final   Special  Requests NONE Reflexed from [id number]  Final   Culture   Final    NO GROWTH Performed at [facility], [address], [place], [place] [address]    Report Status 10/24/2023 FINAL  Final  Respiratory (~20 pathogens) panel by PCR     Status: None   Collection Time: 10/24/23  2:02 AM   Specimen: Nasopharyngeal Swab; Respiratory  Result Value Ref Range Status   Adenovirus NOT DETECTED NOT DETECTED Final   Coronavirus 229E NOT DETECTED NOT DETECTED Final    Comment: (NOTE) The Coronavirus on the Respiratory Panel, DOES NOT test for the novel  Coronavirus (2019 nCoV)    Coronavirus HKU1 NOT DETECTED NOT DETECTED Final   Coronavirus NL63 NOT DETECTED NOT DETECTED Final   Coronavirus OC43 NOT DETECTED NOT DETECTED Final   Metapneumovirus NOT DETECTED NOT DETECTED Final  Rhinovirus / Enterovirus NOT DETECTED NOT DETECTED Final   Influenza A NOT DETECTED NOT DETECTED Final   Influenza B NOT DETECTED NOT DETECTED Final   Parainfluenza Virus 1 NOT DETECTED NOT DETECTED Final   Parainfluenza Virus 2 NOT DETECTED NOT DETECTED Final   Parainfluenza Virus 3 NOT DETECTED NOT DETECTED Final   Parainfluenza Virus 4 NOT DETECTED NOT DETECTED Final   Respiratory Syncytial Virus NOT DETECTED NOT DETECTED Final   Bordetella pertussis NOT DETECTED NOT DETECTED Final   Bordetella Parapertussis NOT DETECTED NOT DETECTED Final   Chlamydophila pneumoniae NOT DETECTED NOT DETECTED Final   Mycoplasma pneumoniae NOT DETECTED NOT DETECTED Final    Comment: Performed at [facility], [address] [place], [place] [address]  MRSA Next Gen by PCR, Nasal     Status: None   Collection Time: 10/24/23  2:02 AM   Specimen: Nasopharyngeal Swab; Nasal Swab  Result Value Ref Range Status   MRSA by PCR Next Gen NOT DETECTED NOT DETECTED Final    Comment: (NOTE) The GeneXpert MRSA Assay (FDA approved for NASAL specimens only), is one component of a comprehensive MRSA colonization surveillance program. It  is not intended to diagnose MRSA infection nor to guide or monitor treatment for MRSA infections. Test performance is not FDA approved in patients less than 66 years old. Performed at [facility], [address], [place], [place] [address]   Culture, blood (Routine X 2) w Reflex to ID Panel     Status: None (Preliminary result)   Collection Time: 10/26/23  6:07 AM   Specimen: BLOOD  Result Value Ref Range Status   Specimen Description BLOOD LEFT ANTECUBITAL  Final   Special Requests   Final    BOTTLES DRAWN AEROBIC ONLY Blood Culture results may not be optimal due to an inadequate volume of blood received in culture bottles   Culture   Final    NO GROWTH 2 DAYS Performed at [facility], [address] [place], [place] [address]    Report Status PENDING  Incomplete  Culture, blood (Routine X 2) w Reflex to ID Panel     Status: None (Preliminary result)   Collection Time: 10/26/23  6:13 AM   Specimen: BLOOD LEFT HAND  Result Value Ref Range Status   Specimen Description BLOOD LEFT HAND  Final   Special Requests   Final    BOTTLES DRAWN AEROBIC AND ANAEROBIC Blood Culture adequate volume   Culture   Final    NO GROWTH 2 DAYS Performed at [facility], [address] [place], [place] [address]    Report Status PENDING  Incomplete    Radiology Report No results found.    Signature  -   Seena Dadds M.D on 10/28/2023 at 10:55 AM   -  To page go to [email or web address]

## 2023-10-28 NOTE — Plan of Care (Signed)
  Problem: Fluid Volume: Goal: Ability to maintain a balanced intake and output will improve Outcome: Progressing   Problem: Metabolic: Goal: Ability to maintain appropriate glucose levels will improve Outcome: Progressing   Problem: Nutritional: Goal: Maintenance of adequate nutrition will improve Outcome: Progressing   Problem: Clinical Measurements: Goal: Respiratory complications will improve Outcome: Progressing   Problem: Safety: Goal: Ability to remain free from injury will improve Outcome: Progressing   Problem: Coping: Goal: Ability to adjust to condition or change in health will improve Outcome: Not Progressing   Problem: Health Behavior/Discharge Planning: Goal: Ability to manage health-related needs will improve Outcome: Not Progressing   Problem: Education: Goal: Knowledge of General Education information will improve Description: Including pain rating scale, medication(s)/side effects and non-pharmacologic comfort measures Outcome: Not Progressing   Problem: Coping: Goal: Level of anxiety will decrease Outcome: Not Progressing   Problem: Skin Integrity: Goal: Risk for impaired skin integrity will decrease Outcome: Not Progressing

## 2023-10-28 NOTE — Plan of Care (Signed)
  Problem: Education: Goal: Ability to describe self-care measures that may prevent or decrease complications (Diabetes Survival Skills Education) will improve Outcome: Progressing   Problem: Fluid Volume: Goal: Ability to maintain a balanced intake and output will improve Outcome: Progressing   Problem: Health Behavior/Discharge Planning: Goal: Ability to identify and utilize available resources and services will improve Outcome: Progressing   Problem: Nutritional: Goal: Maintenance of adequate nutrition will improve Outcome: Progressing   Problem: Skin Integrity: Goal: Risk for impaired skin integrity will decrease Outcome: Progressing   Problem: Tissue Perfusion: Goal: Adequacy of tissue perfusion will improve Outcome: Progressing   Problem: Education: Goal: Knowledge of General Education information will improve Description: Including pain rating scale, medication(s)/side effects and non-pharmacologic comfort measures Outcome: Progressing   Problem: Health Behavior/Discharge Planning: Goal: Ability to manage health-related needs will improve Outcome: Progressing   Problem: Skin Integrity: Goal: Risk for impaired skin integrity will decrease Outcome: Progressing  0650 Pt rested well during the shift and voices no concern, dressing CDI, pt had [place] external catheter replaced due to leaking, pt did have minimal cough after taking po medications, pt repositioned q2 hours with pillows and slept from 11 - 7 am.

## 2023-10-28 NOTE — Progress Notes (Signed)
 Speech Language Pathology Treatment: Dysphagia  Patient Details Name: [name] [name] MRN: [id number] DOB: 11/26/1950 Today's Date: 10/28/2023 Time: 4098-1191 SLP Time Calculation (min) (ACUTE ONLY): 16 min  Assessment / Plan / Recommendation Clinical Impression  SLP conducted skilled therapy session targeting dysphagia goals. SLP and patient began with discussion of current swallowing status. Patient endorses utilization of small sip strategy and intermittent cough. Patient endorses difficulty finding items that are easy to chew. SLP recommended initiating a Dys3 diet, however patient vehemently declined despite education - question insight. Patient observed with regular solid and thin liquids. After regular solid, demonstrated immediate cough x1 and states this is common. No overt s/sx of penetration/aspiration observed with thin liquids. Patient independently utilized small sip strategy. Recommend continuation of regular/thin liquid diet per patient preference, administer medications whole in puree/pudding. Patient was left in room with call bell in reach and alarm set. SLP will continue to target goals per plan of care.     HPI HPI: 73 yo female adm to [facility] with AMS, sepsis - Concern for cellulitis and pna.  She was found covered in feces and maggots.  Pt with PMH + for hiatal hernia, small distal ring, tertiary contractions seen on 2017 UGI study. Pt was in prep for bariatric surgery.  PMH also + for potential bipolar, hoarding, picking at sores and lives alone in a mobile home.  Swallow eval ordered due to pt not passing swallow screen.  Pt admits to issues with dysphagia causing her to cough and sensing food retention in distal pharynx causing her to propel into mouth and reswallow.      SLP Plan  Continue with current plan of care          Recommendations  Diet recommendations: Regular;Thin liquid Liquids provided via: Cup;Straw Medication Administration: Whole meds with puree (follow  with liquid wash) Supervision: Patient able to self feed;Intermittent supervision to cue for compensatory strategies Compensations: Slow rate;Small sips/bites;Other (Comment) Postural Changes and/or Swallow Maneuvers: Seated upright 90 degrees;Upright 30-60 min after meal                  Oral care BID   Frequent or constant Supervision/Assistance Dysphagia, oropharyngeal phase (R13.12)     Continue with current plan of care     Lexx Monte A Deitrick Ferreri  10/28/2023, 12:58 PM

## 2023-10-28 NOTE — TOC Progression Note (Addendum)
 Transition of Care [facility]) - Progression Note    Patient Details  Name: [name] [name] MRN: [id number] Date of Birth: 21-Aug-1950  Transition of Care [facility]) CM/SW Contact  [name], LCSW Phone Number: 10/28/2023, 9:43 AM  Clinical Narrative:    9:43am-CSW received call from [facility] APS [name] [phone]) stating she is coming to visit the patient. CSW provided update. She requested CSW ask Financial Counseling to start a Medicaid application with patient. CSW emailed Antonio to request.   12:08 PM-[facility] able to accept patient pending insurance approval. CSW spoke with Mathilda Solum, [name] (paperwork on the chart) to provide bed offers. She is in agreement with [facility] as [facility] is too far. She stated she will speak with patient to ensure she completes the [facility] application. She reported she makes a little over 1600 a month so doe snot qualify for ALF or community Medicaid but should qualify for SNF Medicaid. [name] reported if patient can get back to the level of transferring herself then she feels patient could return home after rehab.   CSW will begin insurance process once updated PT note is in.    CSW initiated and received insurance authorization for [facility] [place] ID# [id number], effective 06/14 - 11/04/23.      Expected Discharge Plan: Skilled Nursing Facility Barriers to Discharge: Continued Medical Work up, English as a second language teacher, SNF Pending bed offer  Expected Discharge Plan and Services In-house Referral: Clinical Social Work   Post Acute Care Choice: Skilled Nursing Facility Living arrangements for the past 2 months: Mobile Home                                       Social Determinants of Health (SDOH) Interventions SDOH Screenings   Food Insecurity: Food Insecurity Present (10/25/2023)  Housing: High Risk (10/25/2023)  Transportation Needs: Unmet Transportation Needs (10/25/2023)  Utilities: At Risk (10/25/2023)  Social  Connections: Socially Isolated (10/25/2023)  Tobacco Use: Medium Risk (10/24/2023)    Readmission Risk Interventions    06/01/2022   12:03 PM  Readmission Risk Prevention Plan  Post Dischage Appt Complete  Medication Screening Complete  Transportation Screening Complete

## 2023-10-29 DIAGNOSIS — A419 Sepsis, unspecified organism: Secondary | ICD-10-CM | POA: Diagnosis not present

## 2023-10-29 DIAGNOSIS — R6520 Severe sepsis without septic shock: Secondary | ICD-10-CM | POA: Diagnosis not present

## 2023-10-29 LAB — COMPREHENSIVE METABOLIC PANEL WITH GFR
ALT: 29 U/L (ref 0–44)
AST: 29 U/L (ref 15–41)
Albumin: 2.3 g/dL — ABNORMAL LOW (ref 3.5–5.0)
Alkaline Phosphatase: 46 U/L (ref 38–126)
Anion gap: 12 (ref 5–15)
BUN: 18 mg/dL (ref 8–23)
CO2: 27 mmol/L (ref 22–32)
Calcium: 9 mg/dL (ref 8.9–10.3)
Chloride: 101 mmol/L (ref 98–111)
Creatinine, Ser: 1.14 mg/dL — ABNORMAL HIGH (ref 0.44–1.00)
GFR, Estimated: 51 mL/min — ABNORMAL LOW (ref >60)
Glucose, Bld: 123 mg/dL — ABNORMAL HIGH (ref 70–99)
Potassium: 4.1 mmol/L (ref 3.5–5.1)
Sodium: 140 mmol/L (ref 135–145)
Total Bilirubin: 0.6 mg/dL (ref 0.0–1.2)
Total Protein: 5.5 g/dL — ABNORMAL LOW (ref 6.5–8.1)

## 2023-10-29 LAB — GLUCOSE, CAPILLARY
Glucose-Capillary: 106 mg/dL — ABNORMAL HIGH (ref 70–99)
Glucose-Capillary: 117 mg/dL — ABNORMAL HIGH (ref 70–99)
Glucose-Capillary: 118 mg/dL — ABNORMAL HIGH (ref 70–99)
Glucose-Capillary: 128 mg/dL — ABNORMAL HIGH (ref 70–99)
Glucose-Capillary: 131 mg/dL — ABNORMAL HIGH (ref 70–99)

## 2023-10-29 LAB — CBC WITH DIFFERENTIAL/PLATELET
Abs Immature Granulocytes: 0 10*3/uL (ref 0.00–0.07)
Basophils Absolute: 0 10*3/uL (ref 0.0–0.1)
Basophils Relative: 0 %
Eosinophils Absolute: 0.6 10*3/uL — ABNORMAL HIGH (ref 0.0–0.5)
Eosinophils Relative: 4 %
HCT: 33.5 % — ABNORMAL LOW (ref 36.0–46.0)
Hemoglobin: 10.4 g/dL — ABNORMAL LOW (ref 12.0–15.0)
Lymphocytes Relative: 16 %
Lymphs Abs: 2.3 10*3/uL (ref 0.7–4.0)
MCH: 27.4 pg (ref 26.0–34.0)
MCHC: 31 g/dL (ref 30.0–36.0)
MCV: 88.4 fL (ref 80.0–100.0)
Monocytes Absolute: 1.2 10*3/uL — ABNORMAL HIGH (ref 0.1–1.0)
Monocytes Relative: 8 %
Neutro Abs: 10.4 10*3/uL — ABNORMAL HIGH (ref 1.7–7.7)
Neutrophils Relative %: 72 %
Platelets: 381 10*3/uL (ref 150–400)
RBC: 3.79 MIL/uL — ABNORMAL LOW (ref 3.87–5.11)
RDW: 14.3 % (ref 11.5–15.5)
WBC: 14.5 10*3/uL — ABNORMAL HIGH (ref 4.0–10.5)
nRBC: 0 % (ref 0.0–0.2)
nRBC: 0 /100{WBCs}

## 2023-10-29 LAB — PHOSPHORUS: Phosphorus: 3.9 mg/dL (ref 2.5–4.6)

## 2023-10-29 LAB — MAGNESIUM: Magnesium: 1.8 mg/dL (ref 1.7–2.4)

## 2023-10-29 LAB — AMMONIA: Ammonia: 31 umol/L (ref 9–35)

## 2023-10-29 LAB — BRAIN NATRIURETIC PEPTIDE: B Natriuretic Peptide: 416.7 pg/mL — ABNORMAL HIGH (ref 0.0–100.0)

## 2023-10-29 LAB — PROCALCITONIN: Procalcitonin: <0.1 ng/mL

## 2023-10-29 LAB — C-REACTIVE PROTEIN: CRP: 3.9 mg/dL — ABNORMAL HIGH (ref <1.0)

## 2023-10-29 MED ORDER — MENTHOL 3 MG MT LOZG
1.0000 | LOZENGE | OROMUCOSAL | Status: DC | PRN
  Administered 2023-10-29 (×2): 3 mg via ORAL
  Filled 2023-10-29: qty 9

## 2023-10-29 MED ORDER — FENTANYL CITRATE PF 50 MCG/ML IJ SOSY: 25.0000 ug | PREFILLED_SYRINGE | INTRAMUSCULAR | Status: DC | PRN

## 2023-10-29 MED ORDER — FENTANYL CITRATE PF 50 MCG/ML IJ SOSY
25.0000 ug | PREFILLED_SYRINGE | Freq: Once | INTRAMUSCULAR | Status: AC
  Administered 2023-10-30: 25 ug via INTRAVENOUS
  Filled 2023-10-29: qty 1

## 2023-10-29 MED ORDER — PHENOL 1.4 % MT LIQD
1.0000 | OROMUCOSAL | Status: DC | PRN
  Administered 2023-10-30 (×3): 1 via OROMUCOSAL
  Filled 2023-10-29 (×2): qty 177

## 2023-10-29 MED ORDER — ACETAMINOPHEN 325 MG PO TABS
650.0000 mg | ORAL_TABLET | Freq: Four times a day (QID) | ORAL | Status: DC | PRN
  Administered 2023-10-30 (×2): 650 mg via ORAL
  Filled 2023-10-29 (×3): qty 2

## 2023-10-29 MED ORDER — NALOXONE HCL 0.4 MG/ML IJ SOLN: 0.4000 mg | INTRAMUSCULAR | Status: DC | PRN

## 2023-10-29 NOTE — Progress Notes (Signed)
 PROGRESS NOTE                                                                                                                                                                                                             Patient Demographics:    [name] [name], is a 73 y.o. female, DOB - [birth year], ZOX:[id number]  Outpatient Primary MD for the patient is Lory Rough., PA-C    LOS - 5  Admit date - 10/23/2023    Chief Complaint  Patient presents with   Code Sepsis       Brief Narrative (HPI from H&P)      73 y.o. female with medical history significant of COPD, essential hypertension, hyperlipidemia, DM type II, peripheral neuropathy, chronic right femoral fracture, right proximal tibial fracture status post ORIF, chronic constipation, chronic depressive disorder, bipolar disorder, hypothyroidism, hoarding behavior, vitamin D  deficiency, and morbid obesity presented emergency department via EMS as being called out for a welfare check.  EMS found the patient on the ground in her own urine and feces with maggots crawling on her seen by EMS.  Unknown downtime and fall.  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   Subjective:    [name] [name] today with no significant events overnight, she reports she has good appetite, she was able to sit up on the chair for an hour yesterday.  Does report some dry throat.   Assessment  & Plan :    Severe sepsis POA CAP Bacteremia Pressure ulcer with surrounding cellulitis. - Severe sepsis present on admission. - Sepsis related to pneumonia, and wounds with surrounding cellulitis infested with Magots. - As well blood culture and positive for staph, multiple species.  ID input greatly appreciated, possible contamination.   - ID input greatly appreciated, biotics management per ID, treated with IV vancomycin , transition to Zyvox  - With significant skin breakdown, pressure ulcers,  and wounds noted in body folds with erythema and with drainage for wounds, so started on Diflucan .  Bacteremia Staphylococcus Epidermidis Bacteremia 2/2 -  Streptococcus Mitis/Oralis Bacteremia 1/2 -  - Please see above discussion, patient switched to linezolid  to finish 7 days.  Acute hypoxic respiratory failure -Was felt secondary to pneumonia, encouraged use incentive spirometry and flutter valve.   Maggot infestation -Patient found to be buried in Maggots all over  her body.  It has been cleaned while in the ED.  Hypomagnesemia -Replaced   Acute kidney injury superimposed CKD stage IIIb Rhabdomyolysis -AKI most likely in the setting of dehydration, and rhabdomyolysis from being on the floor for couple days. -Improving with IV fluids, continue to monitor total CK.  Acute metabolic encephalopathy-due to dehydration and sepsis. - CT head and C-spine nonacute, no headache or focal deficits, mentation improving, continue supportive care, minimize narcotics and benzodiazepines, stable TSH, B12, RPR and ammonia levels.    Mild transaminitis Hyperbilirubinemia -Transaminitis and hyperbilirubinemia in the setting of dehydration and rhabdomyolysis.  No right upper quadrant tenderness, negative acute hepatitis panel trend is improving.  Monitor.     History of femoral neck fracture Chronic pain syndrome Supportive care, CT noted, discussed with orthopedics PA Umberto Ganong, CT finding of loose hardware is chronic.  Nothing to be done.   Hypophosphatemia Hypomagnesemia - replaced  Anemia of chronic disease -Stable H&H.   Essential hypertension -Hold blood pressure medication in the setting of sepsis and hypotension.   Generalized anxiety disorder Major depressive disorder Bipolar disorder -Will resume home benzos now mentation has improved   Hypothyroidism -Continue  levothyroxine    Peripheral neuropathy -Mentation has improved resume home gabapentin    History of COPD   Acute hypoxic respiratory failure in setting of pneumonia -Continue  supplemental oxygen and check pulse ox. -Continue DuoNeb as needed.  Non-insulin -dependent DM type II currently has hypoglycemia likely due to prolonged starvation and downtime -Continue check POC blood glucose in the setting of altered mental status and NPO.[name]  Holding metformin , TSH stable, check random cortisol, D5 drip for now  Lab Results  Component Value Date   HGBA1C 5.9 (H) 10/25/2023   CBG (last 3)  Recent Labs    10/29/23 0006 10/29/23 0734 10/29/23 1203  GLUCAP 128* 117* 106*    Pressure ulcers: - Is from being on the floor for extended period of time -Continue with wound care Pressure Injury 10/24/23 Face Left Deep Tissue Pressure Injury - Purple or maroon localized area of discolored intact skin or blood-filled blister due to damage of underlying soft tissue from pressure and/or shear. (Active)  10/24/23   Location: Face  Location Orientation: Left  Staging: Deep Tissue Pressure Injury - Purple or maroon localized area of discolored intact skin or blood-filled blister due to damage of underlying soft tissue from pressure and/or shear.  Wound Description (Comments):   DO NOT USE:  Present on Admission: Yes     Pressure Injury 10/24/23 Breast Left Unstageable - Full thickness tissue loss in which the base of the injury is covered by slough (yellow, tan, gray, green or brown) and/or eschar (tan, brown or black) in the wound bed. (Active)  10/24/23   Location: Breast  Location Orientation: Left  Staging: Unstageable - Full thickness tissue loss in which the base of the injury is covered by slough (yellow, tan, gray, green or brown) and/or eschar (tan, brown or black) in the wound bed.  Wound Description (Comments):   DO NOT USE:  Present on Admission: Yes     Pressure Injury 10/24/23 Abdomen Lower;Left Unstageable - Full thickness tissue loss in which the base of the injury is covered by slough  (yellow, tan, gray, green or brown) and/or eschar (tan, brown or black) in the wound bed. (Active)  10/24/23   Location: Abdomen  Location Orientation: Lower;Left  Staging: Unstageable - Full thickness tissue loss in which the base of the injury is covered by slough (  yellow, tan, gray, green or brown) and/or eschar (tan, brown or black) in the wound bed.  Wound Description (Comments):   DO NOT USE:  Present on Admission: Yes     Pressure Injury 10/24/23 Knee Right;Left Deep Tissue Pressure Injury - Purple or maroon localized area of discolored intact skin or blood-filled blister due to damage of underlying soft tissue from pressure and/or shear. (Active)  10/24/23   Location: Knee  Location Orientation: Right;Left  Staging: Deep Tissue Pressure Injury - Purple or maroon localized area of discolored intact skin or blood-filled blister due to damage of underlying soft tissue from pressure and/or shear.  Wound Description (Comments):   DO NOT USE:  Present on Admission: Yes     Pressure Injury 10/24/23 Toe (Comment  which one) Right;Left;Anterior Deep Tissue Pressure Injury - Purple or maroon localized area of discolored intact skin or blood-filled blister due to damage of underlying soft tissue from pressure and/or shear. (Active)  10/24/23   Location: Toe (Comment  which one)  Location Orientation: Right;Left;Anterior  Staging: Deep Tissue Pressure Injury - Purple or maroon localized area of discolored intact skin or blood-filled blister due to damage of underlying soft tissue from pressure and/or shear.  Wound Description (Comments):   DO NOT USE:  Present on Admission: Yes     Pressure Injury 10/24/23 Hand Left Deep Tissue Pressure Injury - Purple or maroon localized area of discolored intact skin or blood-filled blister due to damage of underlying soft tissue from pressure and/or shear. (Active)  10/24/23   Location: Hand  Location Orientation: Left  Staging: Deep Tissue Pressure Injury  - Purple or maroon localized area of discolored intact skin or blood-filled blister due to damage of underlying soft tissue from pressure and/or shear.  Wound Description (Comments):   DO NOT USE:  Present on Admission: Yes          Condition - Extremely Guarded  Family Communication  : None present  Code Status : DNR  Consults  : None  PUD Prophylaxis : PPI   Procedures  :     CT chest abdomen and pelvis- 1. Geographic ground-glass opacities and interlobular septal thickening in the upper lobes. Distribution favors atypical infection. Edema not excluded. Small right and trace left pleural effusions. 2. Distended gallbladder with large gallstones in the gallbladder neck. No definite gallbladder wall thickening or pericholecystic fluid noting limitations of motion. If there is concern for acute cholecystitis, right upper quadrant ultrasound is recommended. 3. Loosening about the right femur hardware. 4. Aortic Atherosclerosis   CT head C-spine.  Nonacute.      Disposition Plan  :    Status is: Inpatient   DVT Prophylaxis  :    heparin  injection 5,000 Units Start: 10/24/23 0600 SCDs Start: 10/24/23 0056 Place TED hose Start: 10/24/23 0056     Lab Results  Component Value Date   PLT 381 10/29/2023    Diet :  Diet Order             Diet regular Room service appropriate? Yes with Assist; Fluid consistency: Thin  Diet effective now                    Inpatient Medications  Scheduled Meds:  cholecalciferol   2,000 Units Oral Daily   fluconazole   150 mg Oral Daily   furosemide   40 mg Oral Daily   gabapentin   600 mg Oral QHS   heparin   5,000 Units Subcutaneous Q8H   insulin  aspart  0-6 Units Subcutaneous TID WC   leptospermum manuka honey  1 Application Topical Daily   levothyroxine   25 mcg Oral Q0600   linezolid   600 mg Oral Q12H   pantoprazole  (PROTONIX ) IV  40 mg Intravenous Q24H   silver  sulfADIAZINE    Topical Daily   sodium chloride  flush  3 mL  Intravenous Q12H   Continuous Infusions:   PRN Meds:.clorazepate , dextrose , hydrALAZINE , ipratropium-albuterol , ondansetron  **OR** ondansetron  (ZOFRAN ) IV, senna-docusate    Objective:   Vitals:   10/29/23 0600 10/29/23 0737 10/29/23 0800 10/29/23 1205  BP:  139/84  137/68  Pulse:  78  80  Resp: 18 (!) 25  16  Temp:  (!) 97.5 F (36.4 C)  97.6 F (36.4 C)  TempSrc:    Oral  SpO2:  93% 93% 92%  Weight:      Height:        Wt Readings from Last 3 Encounters:  10/23/23 117.8 kg  07/09/22 121.6 kg  05/30/22 (!) 136.1 kg     Intake/Output Summary (Last 24 hours) at 10/29/2023 1235 Last data filed at 10/29/2023 0600 Gross per 24 hour  Intake --  Output 1650 ml  Net -1650 ml     Physical Exam   Awake Alert, frail, deconditioned Symmetrical Chest wall movement, Good air movement bilaterally, CTAB RRR,No Gallops,Rubs or new Murmurs, No Parasternal Heave +ve B.Sounds, Abd Soft, No tenderness, No rebound - guarding or rigidity. No Cyanosis, + 1 edema.       RN pressure injury documentation: Pressure Injury 10/24/23 Face Left Deep Tissue Pressure Injury - Purple or maroon localized area of discolored intact skin or blood-filled blister due to damage of underlying soft tissue from pressure and/or shear. (Active)  10/24/23   Location: Face  Location Orientation: Left  Staging: Deep Tissue Pressure Injury - Purple or maroon localized area of discolored intact skin or blood-filled blister due to damage of underlying soft tissue from pressure and/or shear.  Wound Description (Comments):   DO NOT USE:  Present on Admission: Yes  Dressing Type Foam - Lift dressing to assess site every shift 10/29/23 0900     Pressure Injury 10/24/23 Breast Left Unstageable - Full thickness tissue loss in which the base of the injury is covered by slough (yellow, tan, gray, green or brown) and/or eschar (tan, brown or black) in the wound bed. (Active)  10/24/23   Location: Breast   Location Orientation: Left  Staging: Unstageable - Full thickness tissue loss in which the base of the injury is covered by slough (yellow, tan, gray, green or brown) and/or eschar (tan, brown or black) in the wound bed.  Wound Description (Comments):   DO NOT USE:  Present on Admission: Yes  Dressing Type Foam - Lift dressing to assess site every shift 10/29/23 0900     Pressure Injury 10/24/23 Abdomen Lower;Left Unstageable - Full thickness tissue loss in which the base of the injury is covered by slough (yellow, tan, gray, green or brown) and/or eschar (tan, brown or black) in the wound bed. (Active)  10/24/23   Location: Abdomen  Location Orientation: Lower;Left  Staging: Unstageable - Full thickness tissue loss in which the base of the injury is covered by slough (yellow, tan, gray, green or brown) and/or eschar (tan, brown or black) in the wound bed.  Wound Description (Comments):   DO NOT USE:  Present on Admission: Yes  Dressing Type Foam - Lift dressing to assess site every shift 10/29/23 0900  Pressure Injury 10/24/23 Knee Right;Left Deep Tissue Pressure Injury - Purple or maroon localized area of discolored intact skin or blood-filled blister due to damage of underlying soft tissue from pressure and/or shear. (Active)  10/24/23   Location: Knee  Location Orientation: Right;Left  Staging: Deep Tissue Pressure Injury - Purple or maroon localized area of discolored intact skin or blood-filled blister due to damage of underlying soft tissue from pressure and/or shear.  Wound Description (Comments):   DO NOT USE:  Present on Admission: Yes  Dressing Type Foam - Lift dressing to assess site every shift 10/29/23 0900     Pressure Injury 10/24/23 Toe (Comment  which one) Right;Left;Anterior Deep Tissue Pressure Injury - Purple or maroon localized area of discolored intact skin or blood-filled blister due to damage of underlying soft tissue from pressure and/or shear. (Active)   10/24/23   Location: Toe (Comment  which one)  Location Orientation: Right;Left;Anterior  Staging: Deep Tissue Pressure Injury - Purple or maroon localized area of discolored intact skin or blood-filled blister due to damage of underlying soft tissue from pressure and/or shear.  Wound Description (Comments):   DO NOT USE:  Present on Admission: Yes  Dressing Type Foam - Lift dressing to assess site every shift 10/29/23 0900     Pressure Injury 10/24/23 Hand Left Deep Tissue Pressure Injury - Purple or maroon localized area of discolored intact skin or blood-filled blister due to damage of underlying soft tissue from pressure and/or shear. (Active)  10/24/23   Location: Hand  Location Orientation: Left  Staging: Deep Tissue Pressure Injury - Purple or maroon localized area of discolored intact skin or blood-filled blister due to damage of underlying soft tissue from pressure and/or shear.  Wound Description (Comments):   DO NOT USE:  Present on Admission: Yes  Dressing Type Foam - Lift dressing to assess site every shift 10/29/23 0900      Data Review:    Recent Labs  Lab 10/25/23 0524 10/26/23 0613 10/27/23 0737 10/28/23 0716 10/29/23 0721  WBC 14.4* 11.6* 14.0* 15.5* 14.5*  HGB 9.8* 9.9* 10.1* 10.3* 10.4*  HCT 32.5* 32.4* 32.0* 33.1* 33.5*  PLT 295 286 334 347 381  MCV 90.5 89.3 88.2 88.5 88.4  MCH 27.3 27.3 27.8 27.5 27.4  MCHC 30.2 30.6 31.6 31.1 31.0  RDW 13.9 14.0 14.0 14.1 14.3  LYMPHSABS 1.5 1.7 2.4 2.2 2.3  MONOABS 0.7 0.6 0.4 1.1* 1.2*  EOSABS 0.9* 1.0* 0.6* 0.9* 0.6*  BASOSABS 0.0 0.1 0.1 0.0 0.0    Recent Labs  Lab 10/23/23 2107 10/23/23 2107 10/23/23 2113 10/23/23 2321 10/24/23 0202 10/24/23 0700 10/24/23 0954 10/24/23 1948 10/25/23 0524 10/25/23 0536 10/25/23 2012 10/26/23 0613 10/27/23 0737 10/28/23 0716 10/29/23 0721  NA 141  --   --   --  143  --   --   --  145  --   --  142 140 138 140  K 4.2  --   --   --  4.0  --   --   --  3.8  --   --   3.7 4.5 4.3 4.1  CL 109  --   --   --  112*  --   --   --  114*  --   --  113* 111 106 101  CO2 17*  --   --   --  19*  --   --   --  19*  --   --  23 20* 23  27  ANIONGAP 15  --   --   --  12  --   --   --  12  --   --  6 9 9 12   GLUCOSE 144*  --   --   --  139*  --   --   --  93  --   --  113* 112* 118* 123*  BUN 39*  --   --   --  37*  --   --   --  29*  --   --  25* 17 17 18   CREATININE 1.87*  --   --   --  1.59*  --   --   --  1.10*  --   --  1.10* 1.09* 1.08* 1.14*  AST 129*  --   --   --  104*  --   --   --  79*  --   --  77* 45* 34 29  ALT 48*  --   --   --  44  --   --   --  41  --   --  48* 39 34 29  ALKPHOS 61  --   --   --  47  --   --   --  37*  --   --  52 45 56 46  BILITOT 1.3*  --   --   --  1.0  --   --   --  0.7  --   --  0.7 0.6 0.7 0.6  ALBUMIN  2.2*  --   --   --  1.9*  --   --   --  2.4*  --   --  2.3* 2.1* 2.2* 2.3*  CRP  --   --   --   --   --   --   --   --   --  15.5*  --  11.0* 6.7* 4.8* 3.9*  DDIMER 8.69*  --   --   --   --   --   --   --   --   --   --   --   --   --   --   PROCALCITON  --    < >  --   --  1.11  --   --   --   --  0.63  --  0.37 0.22 0.13 <0.10  LATICACIDVEN  --   --    < > 2.4* 2.6* >9.0* 4.8* 2.1*  --   --   --   --   --   --   --   INR 1.2  --   --   --   --   --   --   --   --   --   --   --   --   --   --   TSH  --   --   --   --  1.671  --   --   --   --  4.504*  --   --   --   --   --   HGBA1C  --   --   --   --   --   --   --   --   --   --  5.9*  --   --   --   --   AMMONIA  --    < >  --   --  16  --   --   --   --  22  --  15 30 30 31   BNP  --   --   --   --   --   --   --   --   --  558.3*  --  653.6* 796.6* 697.6* 416.7*  MG  --   --   --   --   --   --   --   --   --  1.7  --  1.5* 1.7 1.5* 1.8  PHOS  --   --   --   --   --   --   --   --   --  2.4*  --  3.4 2.9 3.5 3.9  CALCIUM 8.8*  --   --   --  8.4*  --   --   --  8.5*  --   --  8.6* 8.4* 8.7* 9.0   < > = values in this interval not displayed.      Recent Labs  Lab  10/23/23 2107 10/23/23 2107 10/23/23 2113 10/23/23 2321 10/24/23 0202 10/24/23 0700 10/24/23 0954 10/24/23 1948 10/25/23 0524 10/25/23 0536 10/25/23 2012 10/26/23 6578 10/27/23 0737 10/28/23 0716 10/29/23 0721  CRP  --   --   --   --   --   --   --   --   --  15.5*  --  11.0* 6.7* 4.8* 3.9*  DDIMER 8.69*  --   --   --   --   --   --   --   --   --   --   --   --   --   --   PROCALCITON  --    < >  --   --  1.11  --   --   --   --  0.63  --  0.37 0.22 0.13 <0.10  LATICACIDVEN  --   --    < > 2.4* 2.6* >9.0* 4.8* 2.1*  --   --   --   --   --   --   --   INR 1.2  --   --   --   --   --   --   --   --   --   --   --   --   --   --   TSH  --   --   --   --  1.671  --   --   --   --  4.504*  --   --   --   --   --   HGBA1C  --   --   --   --   --   --   --   --   --   --  5.9*  --   --   --   --   AMMONIA  --    < >  --   --  16  --   --   --   --  22  --  15 30 30 31   BNP  --   --   --   --   --   --   --   --   --  558.3*  --  653.6* 796.6* 697.6* 416.7*  MG  --   --   --   --   --   --   --   --   --  1.7  --  1.5* 1.7 1.5* 1.8  CALCIUM 8.8*  --   --   --  8.4*  --   --   --  8.5*  --   --  8.6* 8.4* 8.7* 9.0   < > = values in this interval not displayed.    --------------------------------------------------------------------------------------------------------------- No results found for: CHOL, HDL, LDLCALC, LDLDIRECT, TRIG, CHOLHDL  Lab Results  Component Value Date   HGBA1C 5.9 (H) 10/25/2023   No results for input(s): TSH, T4TOTAL, FREET4, T3FREE, THYROIDAB in the last 72 hours.  No results for input(s): VITAMINB12, FOLATE, FERRITIN, TIBC, IRON, RETICCTPCT in the last 72 hours.  ------------------------------------------------------------------------------------------------------------------ Cardiac Enzymes No results for input(s): CKMB, TROPONINI, MYOGLOBIN in the last 168 hours.  Invalid input(s): CK  Micro Results Recent  Results (from the past 240 hours)  Blood Culture (routine x 2)     Status: Abnormal   Collection Time: 10/23/23  9:06 PM   Specimen: BLOOD LEFT WRIST  Result Value Ref Range Status   Specimen Description BLOOD LEFT WRIST  Final   Special Requests   Final    BOTTLES DRAWN AEROBIC AND ANAEROBIC Blood Culture adequate volume   Culture  Setup Time   Final    GRAM POSITIVE COCCI IN BOTH AEROBIC AND ANAEROBIC BOTTLES CRITICAL VALUE NOTED.  VALUE IS CONSISTENT WITH PREVIOUSLY REPORTED AND CALLED VALUE. Performed at [facility], [address], [place], [place] [address]    Culture STAPHYLOCOCCUS EPIDERMIDIS (A)  Final   Report Status 10/27/2023 FINAL  Final   Organism ID, Bacteria STAPHYLOCOCCUS EPIDERMIDIS  Final      Susceptibility   Staphylococcus epidermidis - MIC*    CIPROFLOXACIN <=0.5 SENSITIVE Sensitive     ERYTHROMYCIN <=0.25 SENSITIVE Sensitive     GENTAMICIN <=0.5 SENSITIVE Sensitive     OXACILLIN >=4 RESISTANT Resistant     TETRACYCLINE >=16 RESISTANT Resistant     VANCOMYCIN  1 SENSITIVE Sensitive     TRIMETH/SULFA <=10 SENSITIVE Sensitive     CLINDAMYCIN <=0.25 SENSITIVE Sensitive     RIFAMPIN <=0.5 SENSITIVE Sensitive     Inducible Clindamycin NEGATIVE Sensitive     * STAPHYLOCOCCUS EPIDERMIDIS  Blood Culture (routine x 2)     Status: Abnormal   Collection Time: 10/23/23  9:07 PM   Specimen: BLOOD RIGHT WRIST  Result Value Ref Range Status   Specimen Description BLOOD RIGHT WRIST  Final   Special Requests   Final    BOTTLES DRAWN AEROBIC AND ANAEROBIC Blood Culture adequate volume   Culture  Setup Time   Final    GRAM POSITIVE COCCI IN CLUSTERS GRAM POSITIVE COCCI IN CHAINS IN BOTH AEROBIC AND ANAEROBIC BOTTLES CRITICAL RESULT CALLED TO, READ BACK BY AND VERIFIED WITH: PHARMD JEREMY FRENS ON 10/24/23 @ 1502 BY DRT Performed at [facility], [address], [place], [place] [address]    Culture (A)  Final    STAPHYLOCOCCUS EPIDERMIDIS STREPTOCOCCUS  MITIS/ORALIS    Report Status 10/26/2023 FINAL  Final   Organism ID, Bacteria STAPHYLOCOCCUS EPIDERMIDIS  Final   Organism ID, Bacteria STREPTOCOCCUS MITIS/ORALIS  Final      Susceptibility   Staphylococcus epidermidis - MIC*    CIPROFLOXACIN 2 INTERMEDIATE Intermediate     ERYTHROMYCIN <=0.25 SENSITIVE Sensitive     GENTAMICIN <=0.5 SENSITIVE Sensitive     OXACILLIN >=4 RESISTANT Resistant     TETRACYCLINE >=16 RESISTANT Resistant     VANCOMYCIN  <=0.5 SENSITIVE Sensitive     TRIMETH/SULFA <=10  SENSITIVE Sensitive     CLINDAMYCIN <=0.25 SENSITIVE Sensitive     RIFAMPIN <=0.5 SENSITIVE Sensitive     Inducible Clindamycin NEGATIVE Sensitive     * STAPHYLOCOCCUS EPIDERMIDIS   Streptococcus mitis/oralis - MIC*    PENICILLIN <=0.06 SENSITIVE Sensitive     CEFTRIAXONE <=0.12 SENSITIVE Sensitive     LEVOFLOXACIN 1 SENSITIVE Sensitive     VANCOMYCIN  0.5 SENSITIVE Sensitive     * STREPTOCOCCUS MITIS/ORALIS  Blood Culture ID Panel (Reflexed)     Status: Abnormal   Collection Time: 10/23/23  9:07 PM  Result Value Ref Range Status   Enterococcus faecalis NOT DETECTED NOT DETECTED Final   Enterococcus Faecium NOT DETECTED NOT DETECTED Final   Listeria monocytogenes NOT DETECTED NOT DETECTED Final   Staphylococcus species DETECTED (A) NOT DETECTED Final    Comment: CRITICAL RESULT CALLED TO, READ BACK BY AND VERIFIED WITH: PHARMD JEREMY FRENS ON 10/24/23 @ 1502 BY DRT    Staphylococcus aureus (BCID) NOT DETECTED NOT DETECTED Final   Staphylococcus epidermidis DETECTED (A) NOT DETECTED Final    Comment: Methicillin (oxacillin) resistant coagulase negative staphylococcus. Possible blood culture contaminant (unless isolated from more than one blood culture draw or clinical Iribe suggests pathogenicity). No antibiotic treatment is indicated for blood  culture contaminants. CRITICAL RESULT CALLED TO, READ BACK BY AND VERIFIED WITH: PHARMD JEREMY FRENS ON 10/24/23 @ 1502 BY DRT    Staphylococcus  lugdunensis NOT DETECTED NOT DETECTED Final   Streptococcus species DETECTED (A) NOT DETECTED Final    Comment: Not Enterococcus species, Streptococcus agalactiae, Streptococcus pyogenes, or Streptococcus pneumoniae. CRITICAL RESULT CALLED TO, READ BACK BY AND VERIFIED WITH: PHARMD JEREMY FRENS ON 10/24/23 @ 1502 BY DRT    Streptococcus agalactiae NOT DETECTED NOT DETECTED Final   Streptococcus pneumoniae NOT DETECTED NOT DETECTED Final   Streptococcus pyogenes NOT DETECTED NOT DETECTED Final   A.calcoaceticus-baumannii NOT DETECTED NOT DETECTED Final   Bacteroides fragilis NOT DETECTED NOT DETECTED Final   Enterobacterales NOT DETECTED NOT DETECTED Final   Enterobacter cloacae complex NOT DETECTED NOT DETECTED Final   Escherichia coli NOT DETECTED NOT DETECTED Final   Klebsiella aerogenes NOT DETECTED NOT DETECTED Final   Klebsiella oxytoca NOT DETECTED NOT DETECTED Final   Klebsiella pneumoniae NOT DETECTED NOT DETECTED Final   Proteus species NOT DETECTED NOT DETECTED Final   Salmonella species NOT DETECTED NOT DETECTED Final   Serratia marcescens NOT DETECTED NOT DETECTED Final   Haemophilus influenzae NOT DETECTED NOT DETECTED Final   Neisseria meningitidis NOT DETECTED NOT DETECTED Final   Pseudomonas aeruginosa NOT DETECTED NOT DETECTED Final   Stenotrophomonas maltophilia NOT DETECTED NOT DETECTED Final   Candida albicans NOT DETECTED NOT DETECTED Final   Candida auris NOT DETECTED NOT DETECTED Final   Candida glabrata NOT DETECTED NOT DETECTED Final   Candida krusei NOT DETECTED NOT DETECTED Final   Candida parapsilosis NOT DETECTED NOT DETECTED Final   Candida tropicalis NOT DETECTED NOT DETECTED Final   Cryptococcus neoformans/gattii NOT DETECTED NOT DETECTED Final   Methicillin resistance mecA/C DETECTED (A) NOT DETECTED Final    Comment: CRITICAL RESULT CALLED TO, READ BACK BY AND VERIFIED WITH: PHARMD JEREMY FRENS ON 10/24/23 @ 1502 BY DRT Performed at [facility]  Lab, [address] [place], [place] [address]   Urine Culture     Status: None   Collection Time: 10/23/23 11:49 PM   Specimen: Urine, Random  Result Value Ref Range Status   Specimen Description URINE, RANDOM  Final  Special Requests NONE Reflexed from [id number]  Final   Culture   Final    NO GROWTH Performed at [facility], [address], [place], [place] [address]    Report Status 10/24/2023 FINAL  Final  Respiratory (~20 pathogens) panel by PCR     Status: None   Collection Time: 10/24/23  2:02 AM   Specimen: Nasopharyngeal Swab; Respiratory  Result Value Ref Range Status   Adenovirus NOT DETECTED NOT DETECTED Final   Coronavirus 229E NOT DETECTED NOT DETECTED Final    Comment: (NOTE) The Coronavirus on the Respiratory Panel, DOES NOT test for the novel  Coronavirus (2019 nCoV)    Coronavirus HKU1 NOT DETECTED NOT DETECTED Final   Coronavirus NL63 NOT DETECTED NOT DETECTED Final   Coronavirus OC43 NOT DETECTED NOT DETECTED Final   Metapneumovirus NOT DETECTED NOT DETECTED Final   Rhinovirus / Enterovirus NOT DETECTED NOT DETECTED Final   Influenza A NOT DETECTED NOT DETECTED Final   Influenza B NOT DETECTED NOT DETECTED Final   Parainfluenza Virus 1 NOT DETECTED NOT DETECTED Final   Parainfluenza Virus 2 NOT DETECTED NOT DETECTED Final   Parainfluenza Virus 3 NOT DETECTED NOT DETECTED Final   Parainfluenza Virus 4 NOT DETECTED NOT DETECTED Final   Respiratory Syncytial Virus NOT DETECTED NOT DETECTED Final   Bordetella pertussis NOT DETECTED NOT DETECTED Final   Bordetella Parapertussis NOT DETECTED NOT DETECTED Final   Chlamydophila pneumoniae NOT DETECTED NOT DETECTED Final   Mycoplasma pneumoniae NOT DETECTED NOT DETECTED Final    Comment: Performed at [facility] Lab, [address] [place], [place] [address]  MRSA Next Gen by PCR, Nasal     Status: None   Collection Time: 10/24/23  2:02 AM   Specimen: Nasopharyngeal Swab; Nasal Swab  Result Value Ref Range  Status   MRSA by PCR Next Gen NOT DETECTED NOT DETECTED Final    Comment: (NOTE) The GeneXpert MRSA Assay (FDA approved for NASAL specimens only), is one component of a comprehensive MRSA colonization surveillance program. It is not intended to diagnose MRSA infection nor to guide or monitor treatment for MRSA infections. Test performance is not FDA approved in patients less than 75 years old. Performed at [facility] Lab, [address], [place], [place] [address]   Culture, blood (Routine X 2) w Reflex to ID Panel     Status: None (Preliminary result)   Collection Time: 10/26/23  6:07 AM   Specimen: BLOOD  Result Value Ref Range Status   Specimen Description BLOOD LEFT ANTECUBITAL  Final   Special Requests   Final    BOTTLES DRAWN AEROBIC ONLY Blood Culture results may not be optimal due to an inadequate volume of blood received in culture bottles   Culture   Final    NO GROWTH 3 DAYS Performed at [facility] Lab, [address], [place], [place] [address]    Report Status PENDING  Incomplete  Culture, blood (Routine X 2) w Reflex to ID Panel     Status: None (Preliminary result)   Collection Time: 10/26/23  6:13 AM   Specimen: BLOOD LEFT HAND  Result Value Ref Range Status   Specimen Description BLOOD LEFT HAND  Final   Special Requests   Final    BOTTLES DRAWN AEROBIC AND ANAEROBIC Blood Culture adequate volume   Culture   Final    NO GROWTH 3 DAYS Performed at [facility] Lab, [address], [place], [place] [address]  Report Status PENDING  Incomplete    Radiology Report No results found.    Signature  -   Seena Dadds M.D on 10/29/2023 at 12:35 PM   -  To page go to [email or web address]

## 2023-10-29 NOTE — Plan of Care (Signed)

## 2023-10-29 NOTE — Plan of Care (Signed)
 Pt has rested quietly throughout the night with no distress noted. Alert and oriented to person and place and situation. On room air. SR on the monitor. Purewick intact to suction. No complaints voiced.     Problem: Health Behavior/Discharge Planning: Goal: Ability to manage health-related needs will improve Outcome: Progressing   Problem: Nutritional: Goal: Maintenance of adequate nutrition will improve Outcome: Progressing   Problem: Tissue Perfusion: Goal: Adequacy of tissue perfusion will improve Outcome: Progressing   Problem: Education: Goal: Knowledge of General Education information will improve Description: Including pain rating scale, medication(s)/side effects and non-pharmacologic comfort measures Outcome: Progressing   Problem: Clinical Measurements: Goal: Respiratory complications will improve Outcome: Progressing Goal: Cardiovascular complication will be avoided Outcome: Progressing   Problem: Pain Managment: Goal: General experience of comfort will improve and/or be controlled Outcome: Progressing

## 2023-10-29 NOTE — Progress Notes (Signed)
 [facility] night cross cover note:   I was notified by the patient's RN that the patient is reporting right hip discomfort. No current As needed pain meds available.  Most recent vital signs notable for afebrile, heart rates in the 70s to 80s; systolic blood pressures in the 1 teens to 120s, with oxygen saturations noted to be 93 to 96% on room air.  I subsequently added prn IV fentanyl  as well as prn acetaminophen  to help address her discomfort.     Update: blood scan around 2230 on 6/14 showed 300 cc's. Patient without urge to void at this time. Will continue to monitor.      Camelia Cavalier, DO Hospitalist

## 2023-10-30 DIAGNOSIS — A419 Sepsis, unspecified organism: Secondary | ICD-10-CM | POA: Diagnosis not present

## 2023-10-30 DIAGNOSIS — R6520 Severe sepsis without septic shock: Secondary | ICD-10-CM | POA: Diagnosis not present

## 2023-10-30 DIAGNOSIS — N179 Acute kidney failure, unspecified: Secondary | ICD-10-CM | POA: Diagnosis not present

## 2023-10-30 DIAGNOSIS — T796XXS Traumatic ischemia of muscle, sequela: Secondary | ICD-10-CM | POA: Diagnosis not present

## 2023-10-30 DIAGNOSIS — Z515 Encounter for palliative care: Secondary | ICD-10-CM | POA: Diagnosis not present

## 2023-10-30 DIAGNOSIS — R531 Weakness: Secondary | ICD-10-CM | POA: Diagnosis not present

## 2023-10-30 LAB — GLUCOSE, CAPILLARY
Glucose-Capillary: 127 mg/dL — ABNORMAL HIGH (ref 70–99)
Glucose-Capillary: 118 mg/dL — ABNORMAL HIGH (ref 70–99)
Glucose-Capillary: 150 mg/dL — ABNORMAL HIGH (ref 70–99)
Glucose-Capillary: 126 mg/dL — ABNORMAL HIGH (ref 70–99)

## 2023-10-30 LAB — COMPREHENSIVE METABOLIC PANEL WITH GFR
ALT: 25 U/L (ref 0–44)
AST: 27 U/L (ref 15–41)
Albumin: 2.4 g/dL — ABNORMAL LOW (ref 3.5–5.0)
Alkaline Phosphatase: 48 U/L (ref 38–126)
Anion gap: 11 (ref 5–15)
BUN: 21 mg/dL (ref 8–23)
CO2: 30 mmol/L (ref 22–32)
Calcium: 9.1 mg/dL (ref 8.9–10.3)
Chloride: 97 mmol/L — ABNORMAL LOW (ref 98–111)
Creatinine, Ser: 1.23 mg/dL — ABNORMAL HIGH (ref 0.44–1.00)
GFR, Estimated: 46 mL/min — ABNORMAL LOW (ref >60)
Glucose, Bld: 126 mg/dL — ABNORMAL HIGH (ref 70–99)
Potassium: 4 mmol/L (ref 3.5–5.1)
Sodium: 138 mmol/L (ref 135–145)
Total Bilirubin: 0.6 mg/dL (ref 0.0–1.2)
Total Protein: 5.7 g/dL — ABNORMAL LOW (ref 6.5–8.1)

## 2023-10-30 LAB — CBC WITH DIFFERENTIAL/PLATELET
Abs Immature Granulocytes: 0.5 10*3/uL — ABNORMAL HIGH (ref 0.00–0.07)
Basophils Absolute: 0.1 10*3/uL (ref 0.0–0.1)
Basophils Relative: 1 %
Eosinophils Absolute: 1.1 10*3/uL — ABNORMAL HIGH (ref 0.0–0.5)
Eosinophils Relative: 8 %
HCT: 33 % — ABNORMAL LOW (ref 36.0–46.0)
Hemoglobin: 10.4 g/dL — ABNORMAL LOW (ref 12.0–15.0)
Lymphocytes Relative: 8 %
Lymphs Abs: 1.1 10*3/uL (ref 0.7–4.0)
MCH: 27.8 pg (ref 26.0–34.0)
MCHC: 31.5 g/dL (ref 30.0–36.0)
MCV: 88.2 fL (ref 80.0–100.0)
Metamyelocytes Relative: 2 %
Monocytes Absolute: 0.5 10*3/uL (ref 0.1–1.0)
Monocytes Relative: 4 %
Myelocytes: 2 %
Neutro Abs: 9.9 10*3/uL — ABNORMAL HIGH (ref 1.7–7.7)
Neutrophils Relative %: 75 %
Platelets: 385 10*3/uL (ref 150–400)
RBC: 3.74 MIL/uL — ABNORMAL LOW (ref 3.87–5.11)
RDW: 14.3 % (ref 11.5–15.5)
WBC: 13.2 10*3/uL — ABNORMAL HIGH (ref 4.0–10.5)
nRBC: 0 % (ref 0.0–0.2)
nRBC: 0 /100{WBCs}

## 2023-10-30 LAB — PROCALCITONIN: Procalcitonin: <0.1 ng/mL

## 2023-10-30 LAB — AMMONIA: Ammonia: 29 umol/L (ref 9–35)

## 2023-10-30 MED ORDER — PANTOPRAZOLE SODIUM 40 MG PO TBEC
40.0000 mg | DELAYED_RELEASE_TABLET | Freq: Every day | ORAL | Status: DC
  Administered 2023-10-31: 40 mg via ORAL
  Filled 2023-10-30: qty 1

## 2023-10-30 MED ORDER — NYSTATIN 100000 UNIT/GM EX POWD
Freq: Three times a day (TID) | CUTANEOUS | Status: DC
  Filled 2023-10-30: qty 15

## 2023-10-30 NOTE — Progress Notes (Signed)
 PROGRESS NOTE                                                                                                                                                                                                             Patient Demographics:    [name] [name], is a 73 y.o. female, DOB - [birth year], ZOX:[id number]  Outpatient Primary MD for the patient is Lory Rough., PA-C    LOS - 6  Admit date - 10/23/2023    Chief Complaint  Patient presents with   Code Sepsis       Brief Narrative (HPI from H&P)      73 y.o. female with medical history significant of COPD, essential hypertension, hyperlipidemia, DM type II, peripheral neuropathy, chronic right femoral fracture, right proximal tibial fracture status post ORIF, chronic constipation, chronic depressive disorder, bipolar disorder, hypothyroidism, hoarding behavior, vitamin D  deficiency, and morbid obesity presented emergency department via EMS as being called out for a welfare check.  EMS found the patient on the ground in her own urine and feces with maggots crawling on her seen by EMS.  Unknown downtime and fall.  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   Subjective:    [name] [name] reports was able to sit on the chair for couple hours yesterday, good night sleep, appetite is good, she does report chronic left hip pain, she has some urinary retention requiring In-N-Out.     Assessment  & Plan :    Severe sepsis POA CAP Bacteremia Pressure ulcer with surrounding cellulitis. - Severe sepsis present on admission. - Sepsis related to pneumonia, and wounds with surrounding cellulitis infested with Magots. - As well blood culture and positive for staph, multiple species.  ID input greatly appreciated, possible contamination.   - ID input greatly appreciated, biotics management per ID, treated with IV vancomycin , transition to Zyvox  - With significant skin  breakdown, pressure ulcers, and wounds noted in body folds with erythema and with drainage for wounds, so started on Diflucan .  Bacteremia Staphylococcus Epidermidis Bacteremia 2/2 -  Streptococcus Mitis/Oralis Bacteremia 1/2 -  - Please see above discussion, patient switched to linezolid  to finish 7 days.  Acute hypoxic respiratory failure -Was felt secondary to pneumonia, encouraged use incentive spirometry and flutter valve.   Maggot infestation -Patient found to be buried in  Maggots all over her body.  It has been cleaned while in the ED.  Hypomagnesemia -Replaced   Acute kidney injury superimposed CKD stage IIIb Rhabdomyolysis -AKI most likely in the setting of dehydration, and rhabdomyolysis from being on the floor for couple days. -Improving with IV fluids, continue to monitor total CK.  Acute metabolic encephalopathy-due to dehydration and sepsis. - CT head and C-spine nonacute, no headache or focal deficits, mentation improving, continue supportive care, minimize narcotics and benzodiazepines, stable TSH, B12, RPR and ammonia levels.  Urinary retention -Requiring In-N-Out overnight, continue to monitor closely with bladder scan   Mild transaminitis Hyperbilirubinemia -Transaminitis and hyperbilirubinemia in the setting of dehydration and rhabdomyolysis.  No right upper quadrant tenderness, negative acute hepatitis panel trend is improving.  Monitor.     History of femoral neck fracture Chronic pain syndrome Supportive care, CT noted, discussed with orthopedics PA Umberto Ganong, CT finding of loose hardware is chronic.  Nothing to be done.   Hypophosphatemia Hypomagnesemia - replaced  Anemia of chronic disease -Stable H&H.   Essential hypertension -Hold blood pressure medication in the setting of sepsis and hypotension.   Generalized anxiety disorder Major depressive disorder Bipolar disorder -Will resume home benzos now mentation has improved    Hypothyroidism -Continue  levothyroxine    Peripheral neuropathy -Mentation has improved resume home gabapentin    History of COPD  Acute hypoxic respiratory failure in setting of pneumonia -Continue  supplemental oxygen and check pulse ox. -Continue DuoNeb as needed.  Non-insulin -dependent DM type II currently has hypoglycemia likely due to prolonged starvation and downtime -Continue check POC blood glucose in the setting of altered mental status and NPO.[name] [name]  Holding metformin , TSH stable, check random cortisol, D5 drip for now  Lab Results  Component Value Date   HGBA1C 5.9 (H) 10/25/2023   CBG (last 3)  Recent Labs    10/29/23 2136 10/30/23 0751 10/30/23 1126  GLUCAP 131* 127* 118*    Pressure ulcers: - Is from being on the floor for extended period of time -Continue with wound care Pressure Injury 10/24/23 Face Left Deep Tissue Pressure Injury - Purple or maroon localized area of discolored intact skin or blood-filled blister due to damage of underlying soft tissue from pressure and/or shear. (Active)  10/24/23   Location: Face  Location Orientation: Left  Staging: Deep Tissue Pressure Injury - Purple or maroon localized area of discolored intact skin or blood-filled blister due to damage of underlying soft tissue from pressure and/or shear.  Wound Description (Comments):   DO NOT USE:  Present on Admission: Yes     Pressure Injury 10/24/23 Breast Left Unstageable - Full thickness tissue loss in which the base of the injury is covered by slough (yellow, tan, gray, green or brown) and/or eschar (tan, brown or black) in the wound bed. (Active)  10/24/23   Location: Breast  Location Orientation: Left  Staging: Unstageable - Full thickness tissue loss in which the base of the injury is covered by slough (yellow, tan, gray, green or brown) and/or eschar (tan, brown or black) in the wound bed.  Wound Description (Comments):   DO NOT USE:  Present on Admission: Yes      Pressure Injury 10/24/23 Abdomen Lower;Left Unstageable - Full thickness tissue loss in which the base of the injury is covered by slough (yellow, tan, gray, green or brown) and/or eschar (tan, brown or black) in the wound bed. (Active)  10/24/23   Location: Abdomen  Location Orientation: Lower;Left  Staging: Unstageable -  Full thickness tissue loss in which the base of the injury is covered by slough (yellow, tan, gray, green or brown) and/or eschar (tan, brown or black) in the wound bed.  Wound Description (Comments):   DO NOT USE:  Present on Admission: Yes     Pressure Injury 10/24/23 Knee Right;Left Deep Tissue Pressure Injury - Purple or maroon localized area of discolored intact skin or blood-filled blister due to damage of underlying soft tissue from pressure and/or shear. (Active)  10/24/23   Location: Knee  Location Orientation: Right;Left  Staging: Deep Tissue Pressure Injury - Purple or maroon localized area of discolored intact skin or blood-filled blister due to damage of underlying soft tissue from pressure and/or shear.  Wound Description (Comments):   DO NOT USE:  Present on Admission: Yes     Pressure Injury 10/24/23 Toe (Comment  which one) Right;Left;Anterior Deep Tissue Pressure Injury - Purple or maroon localized area of discolored intact skin or blood-filled blister due to damage of underlying soft tissue from pressure and/or shear. (Active)  10/24/23   Location: Toe (Comment  which one)  Location Orientation: Right;Left;Anterior  Staging: Deep Tissue Pressure Injury - Purple or maroon localized area of discolored intact skin or blood-filled blister due to damage of underlying soft tissue from pressure and/or shear.  Wound Description (Comments):   DO NOT USE:  Present on Admission: Yes     Pressure Injury 10/24/23 Hand Left Deep Tissue Pressure Injury - Purple or maroon localized area of discolored intact skin or blood-filled blister due to damage of underlying soft  tissue from pressure and/or shear. (Active)  10/24/23   Location: Hand  Location Orientation: Left  Staging: Deep Tissue Pressure Injury - Purple or maroon localized area of discolored intact skin or blood-filled blister due to damage of underlying soft tissue from pressure and/or shear.  Wound Description (Comments):   DO NOT USE:  Present on Admission: Yes          Condition - Extremely Guarded  Family Communication  : None present  Code Status : DNR  Consults  : None  PUD Prophylaxis : PPI   Procedures  :     CT chest abdomen and pelvis- 1. Geographic ground-glass opacities and interlobular septal thickening in the upper lobes. Distribution favors atypical infection. Edema not excluded. Small right and trace left pleural effusions. 2. Distended gallbladder with large gallstones in the gallbladder neck. No definite gallbladder wall thickening or pericholecystic fluid noting limitations of motion. If there is concern for acute cholecystitis, right upper quadrant ultrasound is recommended. 3. Loosening about the right femur hardware. 4. Aortic Atherosclerosis   CT head C-spine.  Nonacute.      Disposition Plan  :    Status is: Inpatient   DVT Prophylaxis  :    heparin  injection 5,000 Units Start: 10/24/23 0600 SCDs Start: 10/24/23 0056 Place TED hose Start: 10/24/23 0056     Lab Results  Component Value Date   PLT 385 10/30/2023    Diet :  Diet Order             Diet regular Room service appropriate? Yes with Assist; Fluid consistency: Thin  Diet effective now                    Inpatient Medications  Scheduled Meds:  cholecalciferol   2,000 Units Oral Daily   fluconazole   150 mg Oral Daily   furosemide   40 mg Oral Daily   gabapentin   600  mg Oral QHS   heparin   5,000 Units Subcutaneous Q8H   insulin  aspart  0-6 Units Subcutaneous TID WC   leptospermum manuka honey  1 Application Topical Daily   levothyroxine   25 mcg Oral Q0600   [START ON  10/31/2023] pantoprazole   40 mg Oral Daily   silver  sulfADIAZINE    Topical Daily   sodium chloride  flush  3 mL Intravenous Q12H   Continuous Infusions:   PRN Meds:.acetaminophen , clorazepate , dextrose , fentaNYL  (SUBLIMAZE ) injection, hydrALAZINE , ipratropium-albuterol , menthol -cetylpyridinium, naLOXone (NARCAN)  injection, ondansetron  **OR** ondansetron  (ZOFRAN ) IV, phenol, senna-docusate    Objective:   Vitals:   10/30/23 0314 10/30/23 0421 10/30/23 0754 10/30/23 1129  BP: 108/61 105/60 102/71 125/70  Pulse: 80 77 84 84  Resp: 17 20 18 20   Temp:  98.7 F (37.1 C) 97.6 F (36.4 C) 97.8 F (36.6 C)  TempSrc: Oral Oral Oral Oral  SpO2: 92% 91% 94% 90%  Weight:      Height:        Wt Readings from Last 3 Encounters:  10/23/23 117.8 kg  07/09/22 121.6 kg  05/30/22 (!) 136.1 kg     Intake/Output Summary (Last 24 hours) at 10/30/2023 1246 Last data filed at 10/30/2023 0338 Gross per 24 hour  Intake 3 ml  Output 850 ml  Net -847 ml     Physical Exam   Awake Alert, frail, deconditioned Symmetrical Chest wall movement, Good air movement bilaterally, CTAB RRR,No Gallops,Rubs or new Murmurs, No Parasternal Heave +ve B.Sounds, Abd Soft, No tenderness, No rebound - guarding or rigidity.  No Cyanosis, + 1 edema.    RN pressure injury documentation: Pressure Injury 10/24/23 Face Left Deep Tissue Pressure Injury - Purple or maroon localized area of discolored intact skin or blood-filled blister due to damage of underlying soft tissue from pressure and/or shear. (Active)  10/24/23   Location: Face  Location Orientation: Left  Staging: Deep Tissue Pressure Injury - Purple or maroon localized area of discolored intact skin or blood-filled blister due to damage of underlying soft tissue from pressure and/or shear.  Wound Description (Comments):   DO NOT USE:  Present on Admission: Yes  Dressing Type Foam - Lift dressing to assess site every shift 10/29/23 0900     Pressure  Injury 10/24/23 Breast Left Unstageable - Full thickness tissue loss in which the base of the injury is covered by slough (yellow, tan, gray, green or brown) and/or eschar (tan, brown or black) in the wound bed. (Active)  10/24/23   Location: Breast  Location Orientation: Left  Staging: Unstageable - Full thickness tissue loss in which the base of the injury is covered by slough (yellow, tan, gray, green or brown) and/or eschar (tan, brown or black) in the wound bed.  Wound Description (Comments):   DO NOT USE:  Present on Admission: Yes  Dressing Type Foam - Lift dressing to assess site every shift 10/29/23 0900     Pressure Injury 10/24/23 Abdomen Lower;Left Unstageable - Full thickness tissue loss in which the base of the injury is covered by slough (yellow, tan, gray, green or brown) and/or eschar (tan, brown or black) in the wound bed. (Active)  10/24/23   Location: Abdomen  Location Orientation: Lower;Left  Staging: Unstageable - Full thickness tissue loss in which the base of the injury is covered by slough (yellow, tan, gray, green or brown) and/or eschar (tan, brown or black) in the wound bed.  Wound Description (Comments):   DO NOT USE:  Present on Admission:  Yes  Dressing Type Foam - Lift dressing to assess site every shift 10/29/23 0900     Pressure Injury 10/24/23 Knee Right;Left Deep Tissue Pressure Injury - Purple or maroon localized area of discolored intact skin or blood-filled blister due to damage of underlying soft tissue from pressure and/or shear. (Active)  10/24/23   Location: Knee  Location Orientation: Right;Left  Staging: Deep Tissue Pressure Injury - Purple or maroon localized area of discolored intact skin or blood-filled blister due to damage of underlying soft tissue from pressure and/or shear.  Wound Description (Comments):   DO NOT USE:  Present on Admission: Yes  Dressing Type Foam - Lift dressing to assess site every shift 10/29/23 0900     Pressure Injury  10/24/23 Toe (Comment  which one) Right;Left;Anterior Deep Tissue Pressure Injury - Purple or maroon localized area of discolored intact skin or blood-filled blister due to damage of underlying soft tissue from pressure and/or shear. (Active)  10/24/23   Location: Toe (Comment  which one)  Location Orientation: Right;Left;Anterior  Staging: Deep Tissue Pressure Injury - Purple or maroon localized area of discolored intact skin or blood-filled blister due to damage of underlying soft tissue from pressure and/or shear.  Wound Description (Comments):   DO NOT USE:  Present on Admission: Yes  Dressing Type Foam - Lift dressing to assess site every shift 10/29/23 0900     Pressure Injury 10/24/23 Hand Left Deep Tissue Pressure Injury - Purple or maroon localized area of discolored intact skin or blood-filled blister due to damage of underlying soft tissue from pressure and/or shear. (Active)  10/24/23   Location: Hand  Location Orientation: Left  Staging: Deep Tissue Pressure Injury - Purple or maroon localized area of discolored intact skin or blood-filled blister due to damage of underlying soft tissue from pressure and/or shear.  Wound Description (Comments):   DO NOT USE:  Present on Admission: Yes  Dressing Type Foam - Lift dressing to assess site every shift 10/29/23 0900      Data Review:    Recent Labs  Lab 10/26/23 0613 10/27/23 0737 10/28/23 0716 10/29/23 0721 10/30/23 0757  WBC 11.6* 14.0* 15.5* 14.5* 13.2*  HGB 9.9* 10.1* 10.3* 10.4* 10.4*  HCT 32.4* 32.0* 33.1* 33.5* 33.0*  PLT 286 334 347 381 385  MCV 89.3 88.2 88.5 88.4 88.2  MCH 27.3 27.8 27.5 27.4 27.8  MCHC 30.6 31.6 31.1 31.0 31.5  RDW 14.0 14.0 14.1 14.3 14.3  LYMPHSABS 1.7 2.4 2.2 2.3 1.1  MONOABS 0.6 0.4 1.1* 1.2* 0.5  EOSABS 1.0* 0.6* 0.9* 0.6* 1.1*  BASOSABS 0.1 0.1 0.0 0.0 0.1    Recent Labs  Lab 10/23/23 2107 10/23/23 2107 10/23/23 2113 10/23/23 2321 10/24/23 0202 10/24/23 0700 10/24/23 0954  10/24/23 1948 10/25/23 0524 10/25/23 0536 10/25/23 2012 10/26/23 4696 10/27/23 0737 10/28/23 0716 10/29/23 0721 10/30/23 0757  NA 141  --   --   --  143  --   --   --    < >  --   --  142 140 138 140 138  K 4.2  --   --   --  4.0  --   --   --    < >  --   --  3.7 4.5 4.3 4.1 4.0  CL 109  --   --   --  112*  --   --   --    < >  --   --  113* 111 106 101  97*  CO2 17*  --   --   --  19*  --   --   --    < >  --   --  23 20* 23 27 30   ANIONGAP 15  --   --   --  12  --   --   --    < >  --   --  6 9 9 12 11   GLUCOSE 144*  --   --   --  139*  --   --   --    < >  --   --  113* 112* 118* 123* 126*  BUN 39*  --   --   --  37*  --   --   --    < >  --   --  25* 17 17 18 21   CREATININE 1.87*  --   --   --  1.59*  --   --   --    < >  --   --  1.10* 1.09* 1.08* 1.14* 1.23*  AST 129*  --   --   --  104*  --   --   --    < >  --   --  77* 45* 34 29 27  ALT 48*  --   --   --  44  --   --   --    < >  --   --  48* 39 34 29 25  ALKPHOS 61  --   --   --  47  --   --   --    < >  --   --  52 45 56 46 48  BILITOT 1.3*  --   --   --  1.0  --   --   --    < >  --   --  0.7 0.6 0.7 0.6 0.6  ALBUMIN  2.2*  --   --   --  1.9*  --   --   --    < >  --   --  2.3* 2.1* 2.2* 2.3* 2.4*  CRP  --   --   --   --   --   --   --   --   --  15.5*  --  11.0* 6.7* 4.8* 3.9*  --   DDIMER 8.69*  --   --   --   --   --   --   --   --   --   --   --   --   --   --   --   PROCALCITON  --    < >  --   --  1.11  --   --   --   --  0.63  --  0.37 0.22 0.13 <0.10  --   LATICACIDVEN  --   --    < > 2.4* 2.6* >9.0* 4.8* 2.1*  --   --   --   --   --   --   --   --   INR 1.2  --   --   --   --   --   --   --   --   --   --   --   --   --   --   --   TSH  --   --   --   --  1.671  --   --   --   --  4.504*  --   --   --   --   --   --   HGBA1C  --   --   --   --   --   --   --   --   --   --  5.9*  --   --   --   --   --   AMMONIA  --    < >  --   --  16  --   --   --   --  22  --  15 30 30 31 29   BNP  --   --   --   --   --   --   --    --   --  558.3*  --  653.6* 796.6* 697.6* 416.7*  --   MG  --   --   --   --   --   --   --   --   --  1.7  --  1.5* 1.7 1.5* 1.8  --   PHOS  --   --   --   --   --   --   --   --   --  2.4*  --  3.4 2.9 3.5 3.9  --   CALCIUM 8.8*  --   --   --  8.4*  --   --   --    < >  --   --  8.6* 8.4* 8.7* 9.0 9.1   < > = values in this interval not displayed.      Recent Labs  Lab 10/23/23 2107 10/23/23 2107 10/23/23 2113 10/23/23 2321 10/24/23 0202 10/24/23 0700 10/24/23 0954 10/24/23 1948 10/25/23 0524 10/25/23 0536 10/25/23 2012 10/26/23 1610 10/27/23 0737 10/28/23 0716 10/29/23 0721 10/30/23 0757  CRP  --   --   --   --   --   --   --   --   --  15.5*  --  11.0* 6.7* 4.8* 3.9*  --   DDIMER 8.69*  --   --   --   --   --   --   --   --   --   --   --   --   --   --   --   PROCALCITON  --    < >  --   --  1.11  --   --   --   --  0.63  --  0.37 0.22 0.13 <0.10  --   LATICACIDVEN  --   --    < > 2.4* 2.6* >9.0* 4.8* 2.1*  --   --   --   --   --   --   --   --   INR 1.2  --   --   --   --   --   --   --   --   --   --   --   --   --   --   --   TSH  --   --   --   --  1.671  --   --   --   --  4.504*  --   --   --   --   --   --   HGBA1C  --   --   --   --   --   --   --   --   --   --  5.9*  --   --   --   --   --   AMMONIA  --    < >  --   --  16  --   --   --   --  22  --  15 30 30 31 29   BNP  --   --   --   --   --   --   --   --   --  558.3*  --  653.6* 796.6* 697.6* 416.7*  --   MG  --   --   --   --   --   --   --   --   --  1.7  --  1.5* 1.7 1.5* 1.8  --   CALCIUM 8.8*  --   --   --  8.4*  --   --   --    < >  --   --  8.6* 8.4* 8.7* 9.0 9.1   < > = values in this interval not displayed.    --------------------------------------------------------------------------------------------------------------- No results found for: CHOL, HDL, LDLCALC, LDLDIRECT, TRIG, CHOLHDL  Lab Results  Component Value Date   HGBA1C 5.9 (H) 10/25/2023   No results for input(s):  TSH, T4TOTAL, FREET4, T3FREE, THYROIDAB in the last 72 hours.  No results for input(s): VITAMINB12, FOLATE, FERRITIN, TIBC, IRON, RETICCTPCT in the last 72 hours.  ------------------------------------------------------------------------------------------------------------------ Cardiac Enzymes No results for input(s): CKMB, TROPONINI, MYOGLOBIN in the last 168 hours.  Invalid input(s): CK  Micro Results Recent Results (from the past 240 hours)  Blood Culture (routine x 2)     Status: Abnormal   Collection Time: 10/23/23  9:06 PM   Specimen: BLOOD LEFT WRIST  Result Value Ref Range Status   Specimen Description BLOOD LEFT WRIST  Final   Special Requests   Final    BOTTLES DRAWN AEROBIC AND ANAEROBIC Blood Culture adequate volume   Culture  Setup Time   Final    GRAM POSITIVE COCCI IN BOTH AEROBIC AND ANAEROBIC BOTTLES CRITICAL VALUE NOTED.  VALUE IS CONSISTENT WITH PREVIOUSLY REPORTED AND CALLED VALUE. Performed at [facility], [address], [place], [place] [address]    Culture STAPHYLOCOCCUS EPIDERMIDIS (A)  Final   Report Status 10/27/2023 FINAL  Final   Organism ID, Bacteria STAPHYLOCOCCUS EPIDERMIDIS  Final      Susceptibility   Staphylococcus epidermidis - MIC*    CIPROFLOXACIN <=0.5 SENSITIVE Sensitive     ERYTHROMYCIN <=0.25 SENSITIVE Sensitive     GENTAMICIN <=0.5 SENSITIVE Sensitive     OXACILLIN >=4 RESISTANT Resistant     TETRACYCLINE >=16 RESISTANT Resistant     VANCOMYCIN  1 SENSITIVE Sensitive     TRIMETH/SULFA <=10 SENSITIVE Sensitive     CLINDAMYCIN <=0.25 SENSITIVE Sensitive     RIFAMPIN <=0.5 SENSITIVE Sensitive     Inducible Clindamycin NEGATIVE Sensitive     * STAPHYLOCOCCUS EPIDERMIDIS  Blood Culture (routine x 2)     Status: Abnormal   Collection Time: 10/23/23  9:07 PM   Specimen: BLOOD RIGHT WRIST  Result Value Ref Range Status   Specimen Description BLOOD RIGHT WRIST  Final   Special Requests   Final     BOTTLES DRAWN AEROBIC AND ANAEROBIC Blood Culture adequate volume   Culture  Setup Time   Final    GRAM POSITIVE COCCI IN CLUSTERS GRAM POSITIVE COCCI IN CHAINS IN BOTH AEROBIC AND ANAEROBIC BOTTLES CRITICAL RESULT CALLED TO, READ BACK BY AND VERIFIED  WITH: PHARMD JEREMY FRENS ON 10/24/23 @ 1502 BY DRT Performed at [facility], [address], [place], [place] [address]    Culture (A)  Final    STAPHYLOCOCCUS EPIDERMIDIS STREPTOCOCCUS MITIS/ORALIS    Report Status 10/26/2023 FINAL  Final   Organism ID, Bacteria STAPHYLOCOCCUS EPIDERMIDIS  Final   Organism ID, Bacteria STREPTOCOCCUS MITIS/ORALIS  Final      Susceptibility   Staphylococcus epidermidis - MIC*    CIPROFLOXACIN 2 INTERMEDIATE Intermediate     ERYTHROMYCIN <=0.25 SENSITIVE Sensitive     GENTAMICIN <=0.5 SENSITIVE Sensitive     OXACILLIN >=4 RESISTANT Resistant     TETRACYCLINE >=16 RESISTANT Resistant     VANCOMYCIN  <=0.5 SENSITIVE Sensitive     TRIMETH/SULFA <=10 SENSITIVE Sensitive     CLINDAMYCIN <=0.25 SENSITIVE Sensitive     RIFAMPIN <=0.5 SENSITIVE Sensitive     Inducible Clindamycin NEGATIVE Sensitive     * STAPHYLOCOCCUS EPIDERMIDIS   Streptococcus mitis/oralis - MIC*    PENICILLIN <=0.06 SENSITIVE Sensitive     CEFTRIAXONE <=0.12 SENSITIVE Sensitive     LEVOFLOXACIN 1 SENSITIVE Sensitive     VANCOMYCIN  0.5 SENSITIVE Sensitive     * STREPTOCOCCUS MITIS/ORALIS  Blood Culture ID Panel (Reflexed)     Status: Abnormal   Collection Time: 10/23/23  9:07 PM  Result Value Ref Range Status   Enterococcus faecalis NOT DETECTED NOT DETECTED Final   Enterococcus Faecium NOT DETECTED NOT DETECTED Final   Listeria monocytogenes NOT DETECTED NOT DETECTED Final   Staphylococcus species DETECTED (A) NOT DETECTED Final    Comment: CRITICAL RESULT CALLED TO, READ BACK BY AND VERIFIED WITH: PHARMD JEREMY FRENS ON 10/24/23 @ 1502 BY DRT    Staphylococcus aureus (BCID) NOT DETECTED NOT DETECTED Final   Staphylococcus  epidermidis DETECTED (A) NOT DETECTED Final    Comment: Methicillin (oxacillin) resistant coagulase negative staphylococcus. Possible blood culture contaminant (unless isolated from more than one blood culture draw or clinical Dohrman suggests pathogenicity). No antibiotic treatment is indicated for blood  culture contaminants. CRITICAL RESULT CALLED TO, READ BACK BY AND VERIFIED WITH: PHARMD JEREMY FRENS ON 10/24/23 @ 1502 BY DRT    Staphylococcus lugdunensis NOT DETECTED NOT DETECTED Final   Streptococcus species DETECTED (A) NOT DETECTED Final    Comment: Not Enterococcus species, Streptococcus agalactiae, Streptococcus pyogenes, or Streptococcus pneumoniae. CRITICAL RESULT CALLED TO, READ BACK BY AND VERIFIED WITH: PHARMD JEREMY FRENS ON 10/24/23 @ 1502 BY DRT    Streptococcus agalactiae NOT DETECTED NOT DETECTED Final   Streptococcus pneumoniae NOT DETECTED NOT DETECTED Final   Streptococcus pyogenes NOT DETECTED NOT DETECTED Final   A.calcoaceticus-baumannii NOT DETECTED NOT DETECTED Final   Bacteroides fragilis NOT DETECTED NOT DETECTED Final   Enterobacterales NOT DETECTED NOT DETECTED Final   Enterobacter cloacae complex NOT DETECTED NOT DETECTED Final   Escherichia coli NOT DETECTED NOT DETECTED Final   Klebsiella aerogenes NOT DETECTED NOT DETECTED Final   Klebsiella oxytoca NOT DETECTED NOT DETECTED Final   Klebsiella pneumoniae NOT DETECTED NOT DETECTED Final   Proteus species NOT DETECTED NOT DETECTED Final   Salmonella species NOT DETECTED NOT DETECTED Final   Serratia marcescens NOT DETECTED NOT DETECTED Final   Haemophilus influenzae NOT DETECTED NOT DETECTED Final   Neisseria meningitidis NOT DETECTED NOT DETECTED Final   Pseudomonas aeruginosa NOT DETECTED NOT DETECTED Final   Stenotrophomonas maltophilia NOT DETECTED NOT DETECTED Final   Candida albicans NOT DETECTED NOT DETECTED Final   Candida auris NOT DETECTED NOT DETECTED  Final   Candida glabrata NOT DETECTED NOT  DETECTED Final   Candida krusei NOT DETECTED NOT DETECTED Final   Candida parapsilosis NOT DETECTED NOT DETECTED Final   Candida tropicalis NOT DETECTED NOT DETECTED Final   Cryptococcus neoformans/gattii NOT DETECTED NOT DETECTED Final   Methicillin resistance mecA/C DETECTED (A) NOT DETECTED Final    Comment: CRITICAL RESULT CALLED TO, READ BACK BY AND VERIFIED WITH: PHARMD JEREMY FRENS ON 10/24/23 @ 1502 BY DRT Performed at [facility], [address], [place], [place] [address]   Urine Culture     Status: None   Collection Time: 10/23/23 11:49 PM   Specimen: Urine, Random  Result Value Ref Range Status   Specimen Description URINE, RANDOM  Final   Special Requests NONE Reflexed from [id number]  Final   Culture   Final    NO GROWTH Performed at [facility], [address], [place], [place] [address]    Report Status 10/24/2023 FINAL  Final  Respiratory (~20 pathogens) panel by PCR     Status: None   Collection Time: 10/24/23  2:02 AM   Specimen: Nasopharyngeal Swab; Respiratory  Result Value Ref Range Status   Adenovirus NOT DETECTED NOT DETECTED Final   Coronavirus 229E NOT DETECTED NOT DETECTED Final    Comment: (NOTE) The Coronavirus on the Respiratory Panel, DOES NOT test for the novel  Coronavirus (2019 nCoV)    Coronavirus HKU1 NOT DETECTED NOT DETECTED Final   Coronavirus NL63 NOT DETECTED NOT DETECTED Final   Coronavirus OC43 NOT DETECTED NOT DETECTED Final   Metapneumovirus NOT DETECTED NOT DETECTED Final   Rhinovirus / Enterovirus NOT DETECTED NOT DETECTED Final   Influenza A NOT DETECTED NOT DETECTED Final   Influenza B NOT DETECTED NOT DETECTED Final   Parainfluenza Virus 1 NOT DETECTED NOT DETECTED Final   Parainfluenza Virus 2 NOT DETECTED NOT DETECTED Final   Parainfluenza Virus 3 NOT DETECTED NOT DETECTED Final   Parainfluenza Virus 4 NOT DETECTED NOT DETECTED Final   Respiratory Syncytial Virus NOT DETECTED NOT DETECTED Final   Bordetella  pertussis NOT DETECTED NOT DETECTED Final   Bordetella Parapertussis NOT DETECTED NOT DETECTED Final   Chlamydophila pneumoniae NOT DETECTED NOT DETECTED Final   Mycoplasma pneumoniae NOT DETECTED NOT DETECTED Final    Comment: Performed at [facility], [address], [place], [place] [address]  MRSA Next Gen by PCR, Nasal     Status: None   Collection Time: 10/24/23  2:02 AM   Specimen: Nasopharyngeal Swab; Nasal Swab  Result Value Ref Range Status   MRSA by PCR Next Gen NOT DETECTED NOT DETECTED Final    Comment: (NOTE) The GeneXpert MRSA Assay (FDA approved for NASAL specimens only), is one component of a comprehensive MRSA colonization surveillance program. It is not intended to diagnose MRSA infection nor to guide or monitor treatment for MRSA infections. Test performance is not FDA approved in patients less than 74 years old. Performed at [facility] [place] [facility], [address], [place], [place] [address]   Culture, blood (Routine X 2) w Reflex to ID Panel     Status: None (Preliminary result)   Collection Time: 10/26/23  6:07 AM   Specimen: BLOOD  Result Value Ref Range Status   Specimen Description BLOOD LEFT ANTECUBITAL  Final   Special Requests   Final    BOTTLES DRAWN AEROBIC ONLY Blood Culture results may not be optimal due to an inadequate volume of blood  received in culture bottles   Culture   Final    NO GROWTH 4 DAYS Performed at [facility], [address], [place], [place] [address]    Report Status PENDING  Incomplete  Culture, blood (Routine X 2) w Reflex to ID Panel     Status: None (Preliminary result)   Collection Time: 10/26/23  6:13 AM   Specimen: BLOOD LEFT HAND  Result Value Ref Range Status   Specimen Description BLOOD LEFT HAND  Final   Special Requests   Final    BOTTLES DRAWN AEROBIC AND ANAEROBIC Blood Culture adequate volume   Culture   Final    NO GROWTH 4 DAYS Performed at [facility], [address], [place], [place]  [address]    Report Status PENDING  Incomplete    Radiology Report No results found.    Signature  -   Seena Dadds M.D on 10/30/2023 at 12:46 PM   -  To page go to [email or web address]

## 2023-10-30 NOTE — Progress Notes (Signed)
 Pt had 850 mls urine from straight cath.

## 2023-10-30 NOTE — Progress Notes (Signed)
 Dr. Brock Canner was made aware that pt had 300 ml urine in her bladder, and pt doesn't feel like she has to voide. Dr. Brock Canner said to reassess with next void.

## 2023-10-30 NOTE — Plan of Care (Signed)

## 2023-10-30 NOTE — Progress Notes (Signed)
 Patient ID: [name] [name], female   DOB: Dec 23, 1950, 73 y.o.   MRN: [id number]    Progress Note from the Palliative Medicine Team at [facility]   Patient Name: [name] [name]        Date: 10/30/2023 DOB: 1951/03/28  Age: 73 y.o. MRN#: [id number] Attending Physician: Epifanio Haste, MD Primary Care Physician: Lory Rough., PA-C Admit Date: 10/23/2023   Reason for Consultation/Follow-up   Establishing Goals of Care   HPI/ Brief Hospital Review  73 y.o. female admitted on 10/23/2023 with  with past medical history significant of COPD, essential hypertension, hyperlipidemia, DM type II, peripheral neuropathy, chronic right femoral fracture, right proximal tibial fracture status post ORIF, chronic constipation, chronic depressive disorder, bipolar disorder, hypothyroidism, hoarding behavior, vitamin D  deficiency, and morbid obesity presented emergency department via EMS as being called out for a welfare check.      Patient was found on ground in her own urine and feces with maggots crawling on her seen by EMS.  Unknown downtime and fall.   Sister [name] [name]) at bedside reported that patient normally calls once a week so called a welfare check on her when she didn't hear from her. She last spoke on phone last Saturday and patient appeared at her baseline. Reports patient's house is a [employer] [facility] .  APS involved    Patient faces treatment option decisions, advanced directive decisions and anticipatory care needs.    Subjective  Extensive chart review has been completed prior to meeting with patient/family  including labs, vital signs, imaging, progress/consult notes, orders, medications and available advance directive documents.    This NP assessed patient at the bedside as a follow up for palliative medicine needs and emotional support.  Patient is alert and oriented and speaks to plan for SNF for short-term rehab.  Ultimately she hopes that she can  return to her  home.  Encouraged patient to consider her personal care needs and safety in the home   I was able to speak to her H POA/ [name] by telephone.   She verbalizes an  understanding of the complexity of the situation.  Plan is for SNF for short term rehab and possible return to the home.    Will need time for outcomes.   Recommend outpatient palliative services at facility on discharge if eligible  A copy of the patient's living will was coped for scanning in EMR  Education offered today regarding  the importance of continued conversation with HPOA and the medical providers regarding overall plan of care and treatment options,  ensuring decisions are within the context of the patients values and GOCs.  Questions and concerns addressed   Discussed with primary team and nursing staff  PMT will continue to support holistically    Time: 35   minutes  Detailed review of medical records ( labs, imaging, vital signs), medically appropriate exam ( MS, skin, cardiac,  resp)   discussed with treatment team, counseling and education to patient, family, staff, documenting clinical information, medication management, coordination of care    Thena Fireman NP  Palliative Medicine Team Team Phone # [phone] Pager [phone]

## 2023-10-30 NOTE — Progress Notes (Signed)
 Dr. Brock Canner was made aware that pt had 571 urine in bladder.

## 2023-10-31 DIAGNOSIS — A419 Sepsis, unspecified organism: Secondary | ICD-10-CM | POA: Diagnosis not present

## 2023-10-31 DIAGNOSIS — R6520 Severe sepsis without septic shock: Secondary | ICD-10-CM | POA: Diagnosis not present

## 2023-10-31 LAB — CBC
HCT: 34.1 % — ABNORMAL LOW (ref 36.0–46.0)
Hemoglobin: 10.5 g/dL — ABNORMAL LOW (ref 12.0–15.0)
MCH: 27.8 pg (ref 26.0–34.0)
MCHC: 30.8 g/dL (ref 30.0–36.0)
MCV: 90.2 fL (ref 80.0–100.0)
Platelets: 403 10*3/uL — ABNORMAL HIGH (ref 150–400)
RBC: 3.78 MIL/uL — ABNORMAL LOW (ref 3.87–5.11)
RDW: 14.4 % (ref 11.5–15.5)
WBC: 10.2 10*3/uL (ref 4.0–10.5)
nRBC: 0 % (ref 0.0–0.2)

## 2023-10-31 LAB — CULTURE, BLOOD (ROUTINE X 2)
Culture: NO GROWTH
Culture: NO GROWTH
Special Requests: ADEQUATE

## 2023-10-31 LAB — BASIC METABOLIC PANEL WITH GFR
Anion gap: 11 (ref 5–15)
BUN: 18 mg/dL (ref 8–23)
CO2: 30 mmol/L (ref 22–32)
Calcium: 8.8 mg/dL — ABNORMAL LOW (ref 8.9–10.3)
Chloride: 98 mmol/L (ref 98–111)
Creatinine, Ser: 1.19 mg/dL — ABNORMAL HIGH (ref 0.44–1.00)
GFR, Estimated: 48 mL/min — ABNORMAL LOW (ref >60)
Glucose, Bld: 120 mg/dL — ABNORMAL HIGH (ref 70–99)
Potassium: 3.5 mmol/L (ref 3.5–5.1)
Sodium: 139 mmol/L (ref 135–145)

## 2023-10-31 LAB — GLUCOSE, CAPILLARY
Glucose-Capillary: 218 mg/dL — ABNORMAL HIGH (ref 70–99)
Glucose-Capillary: 121 mg/dL — ABNORMAL HIGH (ref 70–99)

## 2023-10-31 MED ORDER — NYSTATIN 100000 UNIT/GM EX POWD
Freq: Three times a day (TID) | CUTANEOUS | 0 refills | Status: AC
Start: 2023-10-31 — End: ?

## 2023-10-31 MED ORDER — FLUCONAZOLE 150 MG PO TABS: 150.0000 mg | ORAL_TABLET | Freq: Every day | ORAL | Status: AC

## 2023-10-31 MED ORDER — MEDIHONEY WOUND/BURN DRESSING EX PSTE: 1.0000 | PASTE | Freq: Every day | CUTANEOUS | Status: AC

## 2023-10-31 MED ORDER — SENNOSIDES-DOCUSATE SODIUM 8.6-50 MG PO TABS
1.0000 | ORAL_TABLET | Freq: Every evening | ORAL | Status: AC | PRN
Start: 2023-10-31 — End: ?

## 2023-10-31 MED ORDER — CLORAZEPATE DIPOTASSIUM 7.5 MG PO TABS: 7.5000 mg | ORAL_TABLET | Freq: Two times a day (BID) | ORAL | 0 refills | Status: AC | PRN

## 2023-10-31 MED ORDER — ACETAMINOPHEN 325 MG PO TABS: 650.0000 mg | ORAL_TABLET | Freq: Four times a day (QID) | ORAL | Status: AC | PRN

## 2023-10-31 MED ORDER — SILVER SULFADIAZINE 1 % EX CREA: TOPICAL_CREAM | Freq: Every day | CUTANEOUS | Status: AC

## 2023-10-31 MED ORDER — FUROSEMIDE 40 MG PO TABS: 20.0000 mg | ORAL_TABLET | Freq: Every day | ORAL | Status: AC

## 2023-10-31 NOTE — TOC Progression Note (Signed)
 Transition of Care [facility]) - Progression Note    Patient Details  Name: [name] [name] MRN: [id number] Date of Birth: [birth year]  Transition of Care [facility] CM/SW Contact  [name], LCSW Phone Number: 10/31/2023, 1:58 PM  Clinical Narrative:    CSW updated patient at bedside. She will require PTAR for transport. CSW also updated [name] with [facility] APS and patient's HCPOA, [name].    Expected Discharge Plan: Skilled Nursing Facility Barriers to Discharge: Barriers Resolved  Expected Discharge Plan and Services In-house Referral: Clinical Social Work   Post Acute Care Choice: Skilled Nursing Facility Living arrangements for the past 2 months: Mobile Home Expected Discharge Date: 10/31/23                                     Social Determinants of Health (SDOH) Interventions SDOH Screenings   Food Insecurity: Food Insecurity Present (10/25/2023)  Housing: High Risk (10/25/2023)  Transportation Needs: Unmet Transportation Needs (10/25/2023)  Utilities: At Risk (10/25/2023)  Social Connections: Socially Isolated (10/25/2023)  Tobacco Use: Medium Risk (10/24/2023)    Readmission Risk Interventions    06/01/2022   12:03 PM  Readmission Risk Prevention Plan  Post Dischage Appt Complete  Medication Screening Complete  Transportation Screening Complete

## 2023-10-31 NOTE — Discharge Summary (Signed)
 Physician Discharge Summary  [name] [name] ZOX:[id number] DOB: 02/16/1951 DOA: 10/23/2023  PCP: Lory Rough., PA-C  Admit date: 10/23/2023 Discharge date: 10/31/2023  Admitted From: (Home) Disposition:  (SNF)  Recommendations for Outpatient Follow-up:  Follow up with PCP in 1-2 weeks Please obtain BMP/CBC in one week Continue with wound care as instructed below Consider palliative care consult as an outpatient  CODE STATUS: DNR  Diet recommendation: Heart Healthy / Carb Modified   Brief/Interim Summary: 73 y.o. female with medical history significant of COPD, essential hypertension, hyperlipidemia, DM type II, peripheral neuropathy, chronic right femoral fracture, right proximal tibial fracture status post ORIF, chronic constipation, chronic depressive disorder, bipolar disorder, hypothyroidism, hoarding behavior, vitamin D  deficiency, and morbid obesity presented emergency department via EMS as being called out for a welfare check.  EMS found the patient on the ground in her own urine and feces with maggots crawling on her seen by EMS.  Unknown downtime and fall.  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    Severe sepsis POA CAP Bacteremia Pressure ulcer with surrounding cellulitis. Skin candidiasis - Severe sepsis present on admission. - Sepsis related to pneumonia, and wounds with surrounding cellulitis infested with Magots. - As well blood culture and positive for staph, multiple species.  ID input greatly appreciated, possible contamination.   - ID input greatly appreciated, biotics management per ID, treated with IV vancomycin , transitioned to Zyvox , finish total 7 days of treatment. - With significant skin breakdown, pressure ulcers, and wounds noted in body folds with erythema and with drainage for wounds, so started on Diflucan .  To finish 10 days of treatment   Bacteremia Staphylococcus Epidermidis Bacteremia 2/2 -  Streptococcus Mitis/Oralis Bacteremia  1/2 -  - Please see above discussion, finish 7 days of antibiotics.   Acute hypoxic respiratory failure -Was felt secondary to pneumonia, encouraged use incentive spirometry and flutter valve.   Maggot infestation -Patient found to be buried in Maggots all over her body.  It has been cleaned while in the ED.   Hypomagnesemia -Replaced   Acute kidney injury superimposed CKD stage IIIb Rhabdomyolysis -AKI most likely in the setting of dehydration, and rhabdomyolysis from being on the floor for couple days. - Improved with IV fluids.   Acute metabolic encephalopathy-due to dehydration and sepsis. - CT head and C-spine nonacute, no headache or focal deficits, mentation improving, continue supportive care, minimize narcotics and benzodiazepines, stable TSH, B12, RPR and ammonia levels.   Urinary retention - Required In-N-Out, did not require Foley catheter insertion, this has resolved.     Mild transaminitis Hyperbilirubinemia -Transaminitis and hyperbilirubinemia in the setting of dehydration and rhabdomyolysis.  No right upper quadrant tenderness, negative acute hepatitis panel trend is improving.  Monitor.     History of femoral neck fracture Chronic pain syndrome Supportive care, CT noted, discussed with orthopedics PA Umberto Ganong, CT finding of loose hardware is chronic.  Nothing to be done.   Hypophosphatemia Hypomagnesemia - replaced   Anemia of chronic disease -Stable H&H.   Essential hypertension -Hold blood pressure medication in the setting of sepsis and hypotension.   Generalized anxiety disorder Major depressive disorder Bipolar disorder -Will resume home benzos now mentation has improved   Hypothyroidism -Continue  levothyroxine    Peripheral neuropathy -Mentation has improved resume home gabapentin    History of COPD  Acute hypoxic respiratory failure in setting of pneumonia -Continue  supplemental oxygen and check pulse ox. -Continue DuoNeb as  needed.   Non-insulin -dependent  DM type II currently has hypoglycemia likely due to prolonged starvation and downtime - When she is 5.9, she was monitored closely, no hyperglycemia or insulin  requirement, on metformin  at home will continue to hold.    Pressure ulcers-continue with wound care, please see above discussion  Pressure Injury 10/24/23 Face Left Deep Tissue Pressure Injury - Purple or maroon localized area of discolored intact skin or blood-filled blister due to damage of underlying soft tissue from pressure and/or shear. (Active)  10/24/23   Location: Face  Location Orientation: Left  Staging: Deep Tissue Pressure Injury - Purple or maroon localized area of discolored intact skin or blood-filled blister due to damage of underlying soft tissue from pressure and/or shear.  Wound Description (Comments):   DO NOT USE:  Present on Admission: Yes     Pressure Injury 10/24/23 Breast Left Unstageable - Full thickness tissue loss in which the base of the injury is covered by slough (yellow, tan, gray, green or brown) and/or eschar (tan, brown or black) in the wound bed. (Active)  10/24/23   Location: Breast  Location Orientation: Left  Staging: Unstageable - Full thickness tissue loss in which the base of the injury is covered by slough (yellow, tan, gray, green or brown) and/or eschar (tan, brown or black) in the wound bed.  Wound Description (Comments):   DO NOT USE:  Present on Admission: Yes     Pressure Injury 10/24/23 Abdomen Lower;Left Unstageable - Full thickness tissue loss in which the base of the injury is covered by slough (yellow, tan, gray, green or brown) and/or eschar (tan, brown or black) in the wound bed. (Active)  10/24/23   Location: Abdomen  Location Orientation: Lower;Left  Staging: Unstageable - Full thickness tissue loss in which the base of the injury is covered by slough (yellow, tan, gray, green or brown) and/or eschar (tan, brown or black) in the wound bed.   Wound Description (Comments):   DO NOT USE:  Present on Admission: Yes     Pressure Injury 10/24/23 Knee Right;Left Deep Tissue Pressure Injury - Purple or maroon localized area of discolored intact skin or blood-filled blister due to damage of underlying soft tissue from pressure and/or shear. (Active)  10/24/23   Location: Knee  Location Orientation: Right;Left  Staging: Deep Tissue Pressure Injury - Purple or maroon localized area of discolored intact skin or blood-filled blister due to damage of underlying soft tissue from pressure and/or shear.  Wound Description (Comments):   DO NOT USE:  Present on Admission: Yes     Pressure Injury 10/24/23 Toe (Comment  which one) Right;Left;Anterior Deep Tissue Pressure Injury - Purple or maroon localized area of discolored intact skin or blood-filled blister due to damage of underlying soft tissue from pressure and/or shear. (Active)  10/24/23   Location: Toe (Comment  which one)  Location Orientation: Right;Left;Anterior  Staging: Deep Tissue Pressure Injury - Purple or maroon localized area of discolored intact skin or blood-filled blister due to damage of underlying soft tissue from pressure and/or shear.  Wound Description (Comments):   DO NOT USE:  Present on Admission: Yes     Pressure Injury 10/24/23 Hand Left Deep Tissue Pressure Injury - Purple or maroon localized area of discolored intact skin or blood-filled blister due to damage of underlying soft tissue from pressure and/or shear. (Active)  10/24/23   Location: Hand  Location Orientation: Left  Staging: Deep Tissue Pressure Injury - Purple or maroon localized area of discolored intact skin or blood-filled blister  due to damage of underlying soft tissue from pressure and/or shear.  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   COPD (chronic obstructive pulmonary disease) (HCC)   Essential hypertension   Non-insulin  dependent type 2 diabetes mellitus (HCC)   Peripheral neuropathy   Fall at home, initial encounter   Cellulitis all over abdomen and thigh area   Acute cystitis   Rhabdomyolysis   History of COPD   Metabolic acidosis   Acute kidney injury superimposed on chronic kidney disease (HCC)   Hyperlipidemia   Transaminitis   Hypothyroidism   Acute hypoxic respiratory failure (HCC)   Acute metabolic encephalopathy    Discharge Instructions  Discharge Instructions     Diet - low sodium heart healthy   Complete by: As directed    Discharge wound care:   Complete by: As directed    10/24/23 1600    Wound care  Daily      Comments: Order Timm Foot # [id number] Measure and cut length of InterDry to fit in skin folds that have skin breakdown  Tuck InterDry fabric into skin folds in a single layer, allow for 2 inches of overhang from skin edges to allow for wicking to occur May remove to bathe; dry area thoroughly and then tuck into affected areas again  Do not apply any creams or ointments when using InterDry DO NOT THROW AWAY FOR 5 DAYS unless soiled with stool DO NOT [facility] product, this will inactivate the silver  in the material  New sheet of Interdry should be applied after 5 days of use if patient continues to have skin breakdown Discontinue use of current sheets after 6/13  10/24/23 1337    10/24/23 1600    Wound care  Daily      Comments: 1. Apply Medihoney Q day  to left cheek, left inner breast fold wound , left lower abd wound and bilat knees.  Cover with foam dressing.  Change foam dressings Q 3 days or PRN soiling 2. Apply Silvadene  to anterior left breast and left thigh blistered areas Q day, then cover with ABD pad and tape.  Wipe away previous Silvadene  with moist gauze each time to remove before applying more.  10/24/23 1357   Increase  activity slowly   Complete by: As directed       Allergies as of 10/31/2023       Reactions   Coffee Flavoring Agent (non-screening) Nausea And Vomiting   Per pt she is allergic to the smell of coffee   Prednisone Other (See Comments)   Tightness in chest   Other Other (See Comments)   Plain nystatin  ointment-makes skin get worse Patient is allergic to any petroleum based ointment         Medication List     STOP taking these medications    clotrimazole  1 % cream Commonly known as: LOTRIMIN    lisinopril  20 MG tablet Commonly known as: ZESTRIL    metFORMIN  500 MG tablet Commonly known as: GLUCOPHAGE    neomycin-polymyxin-pramoxine 1 % cream Commonly known as: NEOSPORIN PLUS       TAKE these medications    acetaminophen  325 MG tablet Commonly known as: TYLENOL  Take 2 tablets (650 mg total) by mouth every 6 (six) hours as needed for moderate pain (  pain score 4-6), fever or headache. What changed:  when to take this reasons to take this   cholecalciferol  1000 units tablet Commonly known as: VITAMIN D  Take 2,000 Units by mouth daily.   clorazepate  7.5 MG tablet Commonly known as: TRANXENE  Take 1 tablet (7.5 mg total) by mouth 2 (two) times daily as needed for anxiety.   Combivent  Respimat 20-100 MCG/ACT Aers respimat Generic drug: Ipratropium-Albuterol  Inhale 2 puffs into the lungs 2 (two) times daily as needed for wheezing.   fluconazole  150 MG tablet Commonly known as: DIFLUCAN  Take 1 tablet (150 mg total) by mouth daily for 4 days. Start taking on: November 01, 2023   furosemide  40 MG tablet Commonly known as: LASIX  Take 0.5 tablets (20 mg total) by mouth daily. Start taking on: November 01, 2023   gabapentin  600 MG tablet Commonly known as: NEURONTIN  Take 600 mg by mouth at bedtime.   leptospermum manuka honey Pste paste Apply 1 Application topically daily. Start taking on: November 01, 2023   levothyroxine  25 MCG tablet Commonly known as:  SYNTHROID  Take 25 mcg by mouth daily before breakfast.   nystatin  powder Commonly known as: MYCOSTATIN /NYSTOP  Apply topically 3 (three) times daily. Apply to skin folds   omeprazole 20 MG capsule Commonly known as: PRILOSEC Take 20 mg by mouth at bedtime.   senna-docusate 8.6-50 MG tablet Commonly known as: Senokot-S Take 1 tablet by mouth at bedtime as needed for mild constipation.   silver  sulfADIAZINE  1 % cream Commonly known as: SILVADENE  Apply topically daily. Start taking on: November 01, 2023               Discharge Care Instructions  (From admission, onward)           Start     Ordered   10/31/23 0000  Discharge wound care:       Comments: 10/24/23 1600    Wound care  Daily      Comments: Order Timm Foot # [id number] Measure and cut length of InterDry to fit in skin folds that have skin breakdown  Tuck InterDry fabric into skin folds in a single layer, allow for 2 inches of overhang from skin edges to allow for wicking to occur May remove to bathe; dry area thoroughly and then tuck into affected areas again  Do not apply any creams or ointments when using InterDry DO NOT THROW AWAY FOR 5 DAYS unless soiled with stool DO NOT [facility] product, this will inactivate the silver  in the material  New sheet of Interdry should be applied after 5 days of use if patient continues to have skin breakdown Discontinue use of current sheets after 6/13  10/24/23 1337    10/24/23 1600    Wound care  Daily      Comments: 1. Apply Medihoney Q day  to left cheek, left inner breast fold wound , left lower abd wound and bilat knees.  Cover with foam dressing.  Change foam dressings Q 3 days or PRN soiling 2. Apply Silvadene  to anterior left breast and left thigh blistered areas Q day, then cover with ABD pad and tape.  Wipe away previous Silvadene  with moist gauze each time to remove before applying more.  10/24/23 1357   10/31/23 1058            Contact information for after-discharge  care     Destination     [facility] .   Service: Skilled Nursing Contact information: [address] [place]   [address] [phone]                    Allergies  Allergen Reactions   Coffee Flavoring Agent (Non-Screening) Nausea And Vomiting    Per pt she is allergic to the smell of coffee   Prednisone Other (See Comments)    Tightness in chest   Other Other (See Comments)    Plain nystatin  ointment-makes skin get worse Patient is allergic to any petroleum based ointment     Consultations: ID   Procedures/Studies: ECHOCARDIOGRAM COMPLETE Result Date: 10/26/2023    ECHOCARDIOGRAM REPORT   Patient Name:   BRECK HOLLINGER  Date of Exam: 10/26/2023 Medical Rec #:  [id number]     Height:       62.0 in Accession #:    [id number]    Weight:       259.7 lb Date of Birth:  [birth year]     BSA:          2.136 m Patient Age:    72 years      BP:           154/85 mmHg Patient Gender: F             HR:           81 bpm. Exam Location:  Inpatient Procedure: 2D Echo, Cardiac Doppler and Color Doppler (Both Spectral and Color            Flow Doppler were utilized during procedure). Indications:    CHF  History:        Patient has prior history of Echocardiogram examinations, most                 recent 08/11/2015. COPD; Risk Factors:Hypertension.  Sonographer:    Jeralene Mom Referring Phys: Florie Husband PRASHANT [facility] IMPRESSIONS  1. Left ventricular ejection fraction, by estimation, is 60 to 65%. The left ventricle has normal function. The left ventricle has no regional wall motion abnormalities. There is moderate concentric left ventricular hypertrophy. Left ventricular diastolic function could not be evaluated. There is the interventricular septum is flattened in systole, consistent with right ventricular pressure overload.  2. Right ventricular systolic function is normal. The right ventricular size is moderately enlarged. There is moderately elevated pulmonary artery  systolic pressure. The estimated right ventricular systolic pressure is 48.2 mmHg.  3. Right atrial size was mildly dilated.  4. The mitral valve was not well visualized. Trivial mitral valve regurgitation. No evidence of mitral stenosis. Severe mitral annular calcification.  5. The aortic valve is calcified. There is moderate calcification of the aortic valve. There is moderate thickening of the aortic valve. Aortic valve regurgitation is not visualized. Moderate aortic valve stenosis. Aortic valve area, by VTI measures 0.79 cm. Aortic valve mean gradient measures 21.5 mmHg. Aortic valve Vmax measures 3.13 m/s.  6. The inferior vena cava is dilated in size with <50% respiratory variability, suggesting right atrial pressure of 15 mmHg. Comparison(s): Changes from prior study are noted. Aortic stenosis now moderate, RV enlarged with elevated RA and RV pressures but normal function. FINDINGS  Left Ventricle: Left ventricular ejection fraction, by estimation, is 60 to 65%. The left ventricle has normal function. The left ventricle has no regional wall motion abnormalities. The left ventricular internal cavity size was normal in size. There is  moderate concentric left ventricular hypertrophy. The interventricular septum is flattened in systole, consistent with right ventricular pressure overload. Left ventricular diastolic function could not be evaluated due  to mitral annular calcification (moderate or greater). Left ventricular diastolic function could not be evaluated. Right Ventricle: The right ventricular size is moderately enlarged. Right vetricular wall thickness was not well visualized. Right ventricular systolic function is normal. There is moderately elevated pulmonary artery systolic pressure. The tricuspid regurgitant velocity is 2.88 m/s, and with an assumed right atrial pressure of 15 mmHg, the estimated right ventricular systolic pressure is 48.2 mmHg. Left Atrium: Left atrial size was normal in size.  Right Atrium: Right atrial size was mildly dilated. Pericardium: Trivial pericardial effusion is present. Mitral Valve: The mitral valve was not well visualized. Severe mitral annular calcification. Trivial mitral valve regurgitation. No evidence of mitral valve stenosis. The mean mitral valve gradient is 2.9 mmHg. Tricuspid Valve: The tricuspid valve is grossly normal. Tricuspid valve regurgitation is mild . No evidence of tricuspid stenosis. Aortic Valve: Concern for low flow low gradient AS. Mean gradient 21 mmHg and DI 0.33, consistent with moderate AS, though AVA is calculated at less than 1. The aortic valve is calcified. There is moderate calcification of the aortic valve. There is moderate thickening of the aortic valve. Aortic valve regurgitation is not visualized. Moderate aortic stenosis is present. Aortic valve mean gradient measures 21.5 mmHg. Aortic valve peak gradient measures 39.3 mmHg. Aortic valve area, by VTI measures 0.79 cm. Pulmonic Valve: The pulmonic valve was not well visualized. Pulmonic valve regurgitation is not visualized. No evidence of pulmonic stenosis. Aorta: The aortic root was not well visualized, the ascending aorta was not well visualized and the aortic arch was not well visualized. Venous: The inferior vena cava is dilated in size with less than 50% respiratory variability, suggesting right atrial pressure of 15 mmHg. IAS/Shunts: The atrial septum is grossly normal. Additional Comments: There is a small pleural effusion in both left and right lateral regions.  LEFT VENTRICLE PLAX 2D LVIDd:         2.40 cm   Diastology LVIDs:         1.60 cm   LV e' medial:    5.77 cm/s LV PW:         1.30 cm   LV E/e' medial:  18.4 LV IVS:        1.30 cm   LV e' lateral:   7.72 cm/s LVOT diam:     1.75 cm   LV E/e' lateral: 13.7 LV SV:         46 LV SV Index:   21 LVOT Area:     2.41 cm  RIGHT VENTRICLE RV Basal diam:  4.71 cm RV S prime:     10.80 cm/s TAPSE (M-mode): 2.0 cm LEFT ATRIUM            Index        RIGHT ATRIUM           Index LA diam:      3.50 cm 1.64 cm/m   RA Area:     16.20 cm LA Vol (A4C): 37.1 ml 17.37 ml/m  RA Volume:   46.30 ml  21.67 ml/m  AORTIC VALVE AV Area (Vmax):    0.78 cm AV Area (Vmean):   0.75 cm AV Area (VTI):     0.79 cm AV Vmax:           313.33 cm/s AV Vmean:          214.000 cm/s AV VTI:            0.581 m AV Peak Grad:  39.3 mmHg AV Mean Grad:      21.5 mmHg LVOT Vmax:         102.00 cm/s LVOT Vmean:        67.000 cm/s LVOT VTI:          0.190 m LVOT/AV VTI ratio: 0.33 MITRAL VALVE                TRICUSPID VALVE MV Area (PHT): 3.72 cm     TR Peak grad:   33.2 mmHg MV Mean grad:  2.9 mmHg     TR Vmax:        288.00 cm/s MV Decel Time: 204 msec MR Peak grad: 52.7 mmHg     SHUNTS MR Vmax:      363.00 cm/s   Systemic VTI:  0.19 m MV E velocity: 106.00 cm/s  Systemic Diam: 1.75 cm MV A velocity: 121.00 cm/s MV E/A ratio:  0.88 Sheryle Donning MD Electronically signed by Sheryle Donning MD Signature Date/Time: 10/26/2023/12:12:27 PM    Final    DG Swallowing Func-Speech Pathology Result Date: 10/25/2023 Table formatting from the original result was not included. Modified Barium Swallow Study Patient Details Name: [name] [name] MRN: [id number] Date of Birth: 1950-11-19 Today's Date: 10/25/2023 HPI/PMH: HPI: 73 yo female adm to [facility] with AMS, sepsis - Concern for cellulitis and pna.  She was found covered in feces and maggots.  Pt with PMH + for hiatal hernia, small distal ring, tertiary contractions seen on 2017 UGI study. Pt was in prep for bariatric surgery.  PMH also + for potential bipolar, hoarding, picking at sores and lives alone in a mobile home.  Swallow eval ordered due to pt not passing swallow screen.  Pt admits to issues with dysphagia causing her to cough and sensing food retention in distal pharynx causing her to propel into mouth and reswallow. Clinical Impression: Pt demonstrates a moderate oropharyngeal dysphagia with a DIGEST score of  2. Patient initiates swallow when liquids have reached the pyriform sinuses. If taking a single sip, swallow function is otherwise adequate. But if the patient takes a large or sequential sip, the bolus hesitates above the PES and the second propulsive effort and hyoid burst pushes liquid into the airway and up to the nasopharynx. Pt gags, material moves to mouth and pt reswallows. When taking a pill there was significant aspiration of a larger thin bolus eliciting hard coughing. When cued to take one sip at a time from a straw or cup pt protected airway consistently. Observed that pt typically has a posterior tilt to head while swallowing. A more neutral or anterior position will also help. Esophageal sweep not possible due to body habitus. Pt agreeable to a trial of therapy to improve awareness of decreased rate but also wants to continue an unrestricted diet. Will resume regular/thin and f/u.  DIGEST Swallow Severity Rating*             Safety: 2             Efficiency:1             Overall Pharyngeal Swallow Severity: 2 1: mild; 2: moderate; 3: severe; 4: profound *The Dynamic Imaging Grade of Swallowing Toxicity is standardized for the head and neck cancer population, however, demonstrates promising clinical applications across populations to standardize the clinical rating of pharyngeal swallow safety and severity. Factors that may increase risk of adverse event in presence of aspiration Roderick Civatte & Jessy Morocco 2021): Factors that may increase risk of  adverse event in presence of aspiration Roderick Civatte & Jessy Morocco 2021): Inadequate oral hygiene; Limited mobility Recommendations/Plan: Swallowing Evaluation Recommendations Swallowing Evaluation Recommendations Liquid Administration via: Cup; Straw Medication Administration: Whole meds with puree Supervision: Patient able to self-feed Swallowing strategies  : Slow rate; Small bites/sips; Minimize environmental distractions Postural changes: Position pt fully upright for  meals Oral care recommendations: Oral care BID (2x/day) Treatment Plan Treatment Plan Follow-up recommendations: Home health SLP Functional status assessment: Patient has had a recent decline in their functional status and demonstrates the ability to make significant improvements in function in a reasonable and predictable amount of time. Treatment frequency: Min 2x/week Treatment duration: 2 weeks Interventions: Aspiration precaution training Recommendations Recommendations for follow up therapy are one component of a multi-disciplinary discharge planning process, led by the attending physician.  Recommendations may be updated based on patient status, additional functional criteria and insurance authorization. Assessment: Orofacial Exam: Orofacial Exam Oral Cavity: Oral Hygiene: Xerostomia Oral Cavity - Dentition: Poor condition Oral Motor/Sensory Function: WFL Anatomy: Anatomy: WFL Boluses Administered: Boluses Administered Boluses Administered: Thin liquids (Level 0); Mildly thick liquids (Level 2, nectar thick); Puree; Solid  Oral Impairment Domain: Oral Impairment Domain Lip Closure: No labial escape Tongue control during bolus hold: Cohesive bolus between tongue to palatal seal Bolus preparation/mastication: Slow prolonged chewing/mashing with complete recollection (missing dentition) Bolus transport/lingual motion: Brisk tongue motion Oral residue: Trace residue lining oral structures Location of oral residue : Tongue Initiation of pharyngeal swallow : Pyriform sinuses  Pharyngeal Impairment Domain: Pharyngeal Impairment Domain Soft palate elevation: Trace column of contrast or air between SP and PW; Escape to nasopharynx Laryngeal elevation: Complete superior movement of thyroid cartilage with complete approximation of arytenoids to epiglottic petiole Anterior hyoid excursion: Complete anterior movement Epiglottic movement: Complete inversion Laryngeal vestibule closure: Incomplete, narrow column  air/contrast in laryngeal vestibule Pharyngeal stripping wave : Present - complete Pharyngoesophageal segment opening: Complete distension and complete duration, no obstruction of flow Tongue base retraction: Trace column of contrast or air between tongue base and PPW Pharyngeal residue: Trace residue within or on pharyngeal structures Location of pharyngeal residue: Valleculae; Tongue base  Esophageal Impairment Domain: No data recorded Pill: Pill Consistency administered: Thin liquids (Level 0) Thin liquids (Level 0): Impaired (see clinical impressions) Penetration/Aspiration Scale Score: Penetration/Aspiration Scale Score 1.  Material does not enter airway: Thin liquids (Level 0); Mildly thick liquids (Level 2, nectar thick); Puree; Solid 4.  Material enters airway, CONTACTS cords then ejected out: Thin liquids (Level 0) 7.  Material enters airway, passes BELOW cords and not ejected out despite cough attempt by patient: Thin liquids (Level 0) Compensatory Strategies: Compensatory Strategies Compensatory strategies: Yes Oral bolus hold: Effective   General Information: Caregiver present: No  Diet Prior to this Study: NPO   Temperature : Normal   Respiratory Status: WFL   Supplemental O2: None (Room air)   History of Recent Intubation: No  Behavior/Cognition: Alert; Pleasant mood; Cooperative Self-Feeding Abilities: Able to self-feed Baseline vocal quality/speech: Dysphonic Volitional Cough: Able to elicit Volitional Swallow: Able to elicit No data recorded Goal Planning: Prognosis for improved oropharyngeal function: Fair No data recorded No data recorded Patient/Family Stated Goal: wants water  No data recorded Pain: Pain Assessment Pain Assessment: No/denies pain End of Session: Start Time:SLP Start Time (ACUTE ONLY): 1415 Stop Time: SLP Stop Time (ACUTE ONLY): 1440 Time Calculation:SLP Time Calculation (min) (ACUTE ONLY): 25 min Charges: SLP Evaluations $ SLP Speech Visit: 1 Visit SLP Evaluations $BSS Swallow: 1  Procedure $MBS Swallow: 1 Procedure $  Swallowing Treatment: 1 Procedure SLP visit diagnosis: SLP Visit Diagnosis: Dysphagia, oropharyngeal phase (R13.12) Past Medical History: Past Medical History: Diagnosis Date  Anemia   Anxiety   Arthritis   Bipolar disorder (HCC)   Bronchitis   COPD (chronic obstructive pulmonary disease) (HCC)   Depression   Diabetes mellitus   type II  Encounter for blood transfusion   GERD (gastroesophageal reflux disease)   Hearing loss   Hyperlipemia   Hypertension   Nonunion, 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  Peripheral neuropathy   Pneumonia   hx of   Shortness of breath   with exertion   Skin yeast infection   history of under bilateral breast,, pannus, inner legs  Sleep apnea   uses bipap- does not know setting s Past Surgical History: Past Surgical History: Procedure Laterality Date  COLONOSCOPY  10/22/2011  Procedure: COLONOSCOPY;  Surgeon: Almeda Aris, MD;  Location: [facility];  Service: Endoscopy;  Laterality: N/A;  COLONOSCOPY WITH PROPOFOL  N/A 01/21/2017  Procedure: COLONOSCOPY WITH PROPOFOL ;  Surgeon: Alvis Jourdain, MD;  Location: [facility];  Service: Endoscopy;  Laterality: N/A;  ESOPHAGOGASTRODUODENOSCOPY  10/22/2011  Procedure: ESOPHAGOGASTRODUODENOSCOPY (EGD);  Surgeon: Almeda Aris, MD;  Location: [facility];  Service: Endoscopy;  Laterality: N/A;  femur fx    FEMUR IM NAIL Right 01/08/2014  Procedure: INTRAMEDULLARY (IM) NAIL RIGHT HIP WITH INFUSED ALLOGRAFTING;  Surgeon: Arlette Lagos, MD;  Location: [facility];  Service: Orthopedics;  Laterality: Right;  HARDWARE REMOVAL  03/27/2012  Procedure: HARDWARE REMOVAL;  Surgeon: Bevin Bucks, MD;  Location: [facility];  Service: Orthopedics;  Laterality: Right;  HARDWARE REMOVAL Right 01/08/2014  Procedure: HARDWARE REMOVAL RIGHT PROXIMAL FEMUR;  Surgeon: Arlette Lagos, MD;  Location: [facility];  Service: Orthopedics;  Laterality: Right;  HIP FRACTURE SURGERY    INTRAMEDULLARY (IM) NAIL INTERTROCHANTERIC   03/27/2012  Procedure: INTRAMEDULLARY (IM) NAIL INTERTROCHANTRIC;  Surgeon: Bevin Bucks, MD;  Location: [facility];  Service: Orthopedics;  Laterality: Right;  Possible Exchange with a Shorter Trochantric Nail  OPEN REDUCTION INTERNAL FIXATION (ORIF) TIBIA/FIBULA FRACTURE Right 06/03/2022  Procedure: OPEN REDUCTION INTERNAL FIXATION (ORIF) OF RIGHT TIBIA;  Surgeon: Hardy Lia, MD;  Location: [facility];  Service: Orthopedics;  Laterality: Right;  TONSILLECTOMY   DeBlois, Hardin Leys 10/25/2023, 3:34 PM  DG Chest Port 1 View Result Date: 10/25/2023 CLINICAL DATA:  73 year old female with shortness of breath. Sepsis. EXAM: PORTABLE CHEST 1 VIEW COMPARISON:  CT Chest, Abdomen, and Pelvis 10/23/2023. FINDINGS: Portable AP semi upright view at 0550 hours. Stable low lung volumes. Small layering pleural effusions better demonstrated on the recent CT. Stable mediastinal contours, heart size normal on the recent CT. Visualized tracheal air column is within normal limits. No pneumothorax. Pulmonary vascular congestion appears regressed compared to 10/23/2023 portable exam, no overt edema now. No air bronchograms. Paucity of bowel gas in the upper abdomen. Stable visualized osseous structures. IMPRESSION: 1. Regressed pulmonary edema since 10/23/2023. Small layering pleural effusions better demonstrated on recent CT. 2. No new cardiopulmonary abnormality. Electronically Signed   By: Marlise Simpers M.D.   On: 10/25/2023 06:07   CT CHEST ABDOMEN PELVIS WO CONTRAST Result Date: 10/24/2023 CLINICAL DATA:  Sepsis, head and neck trauma EXAM: CT CHEST, ABDOMEN AND PELVIS WITHOUT CONTRAST TECHNIQUE: Multidetector CT imaging of the chest, abdomen and pelvis was performed following the standard protocol without IV contrast. RADIATION DOSE REDUCTION: This exam was performed according to the departmental dose-optimization program which includes automated exposure control, adjustment of  the mA and/or kV according to patient size and/or use  of iterative reconstruction technique. COMPARISON:  Same day chest radiograph and CT chest 01/10/2008 FINDINGS: CT CHEST FINDINGS Cardiovascular: Normal heart size. No pericardial effusion. Mitral annular calcification. Normal caliber thoracic aorta. Aortic and coronary artery calcification. Mediastinum/Nodes: No mediastinal hematoma. Small hiatal hernia. Trachea is patent. No thoracic adenopathy. Lungs/Pleura: Respiratory motion obscures detail. Geographic ground-glass opacities and interlobular septal thickening in the upper lobes. Small right and trace left pleural effusions. No pneumothorax. Musculoskeletal: No acute fracture. CT ABDOMEN PELVIS FINDINGS Hepatobiliary: Distended gallbladder with large gallstones in the gallbladder neck. No biliary dilation. No definite gallbladder wall thickening or pericholecystic fluid noting limitations of motion. Unremarkable noncontrast appearance of the liver. Pancreas: Unremarkable. Spleen: Unremarkable. Adrenals/Urinary Tract: Normal adrenal glands. No urinary calculi or hydronephrosis. Unremarkable bladder. Stomach/Bowel: Normal caliber large and small bowel. No bowel wall thickening. Stomach is within normal limits. Vascular/Lymphatic: Aortic atherosclerotic calcification. No lymphadenopathy. Reproductive: Uterus and bilateral adnexa are unremarkable. Other: No free intraperitoneal fluid or air. Musculoskeletal: IM rod and screw fixation about the right femur with lucency about the femoral stem component compatible with loosening. No acute fracture. Advanced lumbar spondylosis. IMPRESSION: 1. Geographic ground-glass opacities and interlobular septal thickening in the upper lobes. Distribution favors atypical infection. Edema not excluded. Small right and trace left pleural effusions. 2. Distended gallbladder with large gallstones in the gallbladder neck. No definite gallbladder wall thickening or pericholecystic fluid noting limitations of motion. If there is concern  for acute cholecystitis, right upper quadrant ultrasound is recommended. 3. Loosening about the right femur hardware. 4. Aortic Atherosclerosis (ICD10-I70.0). Electronically Signed   By: Rozell Cornet M.D.   On: 10/24/2023 00:23   CT Head Wo Contrast Result Date: 10/23/2023 CLINICAL DATA:  Code sepsis. Head trauma, moderate to severe. Neck trauma. EXAM: CT HEAD WITHOUT CONTRAST CT CERVICAL SPINE WITHOUT CONTRAST TECHNIQUE: Multidetector CT imaging of the head and cervical spine was performed following the standard protocol without intravenous contrast. Multiplanar CT image reconstructions of the cervical spine were also generated. RADIATION DOSE REDUCTION: This exam was performed according to the departmental dose-optimization program which includes automated exposure control, adjustment of the mA and/or kV according to patient size and/or use of iterative reconstruction technique. COMPARISON:  CT cervical spine 10/04/2008 FINDINGS: CT HEAD FINDINGS Brain: No intracranial hemorrhage, mass effect, or evidence of acute infarct. No hydrocephalus. No extra-axial fluid collection. Age related cerebral atrophy and chronic small vessel ischemic disease. 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 Vascular: No hyperdense vessel. Intracranial arterial calcification. Skull: No fracture or focal lesion. Sinuses/Orbits: No acute finding. Other: None. CT CERVICAL SPINE FINDINGS Alignment: No evidence of traumatic malalignment. Chronic anterolisthesis of C3 and C4. Skull base and vertebrae: No acute fracture. No primary bone lesion or focal pathologic process. Soft tissues and spinal canal: No prevertebral fluid or swelling. No visible canal hematoma. Disc levels: Multilevel spondylosis, disc space height loss, and degenerative endplate changes greatest at C4-C5 and C5-C6 where it is moderate. No severe spinal canal narrowing. Upper chest: Reported separately. Other: Carotid calcification. IMPRESSION: 1. No  acute intracranial abnormality. 2. No acute fracture in the cervical spine. Electronically Signed   By: Rozell Cornet M.D.   On: 10/23/2023 22:45   CT Cervical Spine Wo Contrast Result Date: 10/23/2023 CLINICAL DATA:  Code sepsis. Head trauma, moderate to severe. Neck trauma. EXAM: CT HEAD WITHOUT CONTRAST CT CERVICAL SPINE WITHOUT CONTRAST TECHNIQUE: Multidetector CT imaging of the head and cervical spine  was performed following the standard protocol without intravenous contrast. Multiplanar CT image reconstructions of the cervical spine were also generated. RADIATION DOSE REDUCTION: This exam was performed according to the departmental dose-optimization program which includes automated exposure control, adjustment of the mA and/or kV according to patient size and/or use of iterative reconstruction technique. COMPARISON:  CT cervical spine 10/04/2008 FINDINGS: CT HEAD FINDINGS Brain: No intracranial hemorrhage, mass effect, or evidence of acute infarct. No hydrocephalus. No extra-axial fluid collection. Age related cerebral atrophy and chronic small vessel ischemic disease. 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 Vascular: No hyperdense vessel. Intracranial arterial calcification. Skull: No fracture or focal lesion. Sinuses/Orbits: No acute finding. Other: None. CT CERVICAL SPINE FINDINGS Alignment: No evidence of traumatic malalignment. Chronic anterolisthesis of C3 and C4. Skull base and vertebrae: No acute fracture. No primary bone lesion or focal pathologic process. Soft tissues and spinal canal: No prevertebral fluid or swelling. No visible canal hematoma. Disc levels: Multilevel spondylosis, disc space height loss, and degenerative endplate changes greatest at C4-C5 and C5-C6 where it is moderate. No severe spinal canal narrowing. Upper chest: Reported separately. Other: Carotid calcification. IMPRESSION: 1. No acute intracranial abnormality. 2. No acute fracture in the cervical  spine. Electronically Signed   By: Rozell Cornet M.D.   On: 10/23/2023 22:45   DG Pelvis Portable Result Date: 10/23/2023 CLINICAL DATA:  Fall. EXAM: PORTABLE PELVIS 1-2 VIEWS COMPARISON:  05/30/2022 FINDINGS: Evidence of remote posttraumatic and postsurgical changes in the right femur. Lucency around the femoral neck screws and intramedullary nail likely reflects loosening. This is stable when compared to prior study. No acute fracture, subluxation or dislocation. Mild degenerative changes in the hip joints bilaterally. IMPRESSION: No acute bony abnormality. Lucency around the right femoral hardware likely reflects loosening, unchanged since 05/30/2022. Electronically Signed   By: Janeece Mechanic M.D.   On: 10/23/2023 21:46   DG Chest Port 1 View Result Date: 10/23/2023 CLINICAL DATA:  Questionable sepsis.  Fall. EXAM: PORTABLE CHEST 1 VIEW COMPARISON:  05/30/2022 FINDINGS: Heart mediastinal contours are within normal limits. Perihilar opacities and interstitial prominence throughout the lungs. No effusions or pneumothorax. No acute bony abnormality. IMPRESSION: Perihilar opacities and interstitial prominence throughout the lungs could reflect edema or infection. Atypical/viral infection possible. Electronically Signed   By: Janeece Mechanic M.D.   On: 10/23/2023 21:44      Subjective:  No significant events overnight, she denies any complaints today. Discharge Exam: Vitals:   10/31/23 0500 10/31/23 0800  BP:    Pulse: 79 84  Resp: 19 17  Temp:    SpO2: 92% 91%   Vitals:   10/30/23 2153 10/31/23 0000 10/31/23 0500 10/31/23 0800  BP: 124/64     Pulse: 82 83 79 84  Resp: (!) 22 15 19 17   Temp:      TempSrc:      SpO2: (!) 88% 92% 92% 91%  Weight:      Height:        General: Pt is alert, awake, not in acute distress, frail Cardiovascular: RRR, S1/S2 +, no rubs, no gallops Respiratory: CTA bilaterally, no wheezing, no rhonchi Abdominal: Soft, NT, ND, bowel sounds + Extremities: Multiple  skin pressure ulcers, erythema in upper thighs and abdomen has improved   The results of significant diagnostics from this hospitalization (including imaging, microbiology, ancillary and laboratory) are listed below for reference.     Microbiology: Recent Results (from the past 240 hours)  Blood Culture (routine x 2)  Status: Abnormal   Collection Time: 10/23/23  9:06 PM   Specimen: BLOOD LEFT WRIST  Result Value Ref Range Status   Specimen Description BLOOD LEFT WRIST  Final   Special Requests   Final    BOTTLES DRAWN AEROBIC AND ANAEROBIC Blood Culture adequate volume   Culture  Setup Time   Final    GRAM POSITIVE COCCI IN BOTH AEROBIC AND ANAEROBIC BOTTLES CRITICAL VALUE NOTED.  VALUE IS CONSISTENT WITH PREVIOUSLY REPORTED AND CALLED VALUE. Performed at [facility], [address], [place], [place] [address]    Culture STAPHYLOCOCCUS EPIDERMIDIS (A)  Final   Report Status 10/27/2023 FINAL  Final   Organism ID, Bacteria STAPHYLOCOCCUS EPIDERMIDIS  Final      Susceptibility   Staphylococcus epidermidis - MIC*    CIPROFLOXACIN <=0.5 SENSITIVE Sensitive     ERYTHROMYCIN <=0.25 SENSITIVE Sensitive     GENTAMICIN <=0.5 SENSITIVE Sensitive     OXACILLIN >=4 RESISTANT Resistant     TETRACYCLINE >=16 RESISTANT Resistant     VANCOMYCIN  1 SENSITIVE Sensitive     TRIMETH/SULFA <=10 SENSITIVE Sensitive     CLINDAMYCIN <=0.25 SENSITIVE Sensitive     RIFAMPIN <=0.5 SENSITIVE Sensitive     Inducible Clindamycin NEGATIVE Sensitive     * STAPHYLOCOCCUS EPIDERMIDIS  Blood Culture (routine x 2)     Status: Abnormal   Collection Time: 10/23/23  9:07 PM   Specimen: BLOOD RIGHT WRIST  Result Value Ref Range Status   Specimen Description BLOOD RIGHT WRIST  Final   Special Requests   Final    BOTTLES DRAWN AEROBIC AND ANAEROBIC Blood Culture adequate volume   Culture  Setup Time   Final    GRAM POSITIVE COCCI IN CLUSTERS GRAM POSITIVE COCCI IN CHAINS IN BOTH AEROBIC AND ANAEROBIC  BOTTLES CRITICAL RESULT CALLED TO, READ BACK BY AND VERIFIED WITH: PHARMD JEREMY FRENS ON 10/24/23 @ 1502 BY DRT Performed at [facility], [address], [place], [place] [address]    Culture (A)  Final    STAPHYLOCOCCUS EPIDERMIDIS STREPTOCOCCUS MITIS/ORALIS    Report Status 10/26/2023 FINAL  Final   Organism ID, Bacteria STAPHYLOCOCCUS EPIDERMIDIS  Final   Organism ID, Bacteria STREPTOCOCCUS MITIS/ORALIS  Final      Susceptibility   Staphylococcus epidermidis - MIC*    CIPROFLOXACIN 2 INTERMEDIATE Intermediate     ERYTHROMYCIN <=0.25 SENSITIVE Sensitive     GENTAMICIN <=0.5 SENSITIVE Sensitive     OXACILLIN >=4 RESISTANT Resistant     TETRACYCLINE >=16 RESISTANT Resistant     VANCOMYCIN  <=0.5 SENSITIVE Sensitive     TRIMETH/SULFA <=10 SENSITIVE Sensitive     CLINDAMYCIN <=0.25 SENSITIVE Sensitive     RIFAMPIN <=0.5 SENSITIVE Sensitive     Inducible Clindamycin NEGATIVE Sensitive     * STAPHYLOCOCCUS EPIDERMIDIS   Streptococcus mitis/oralis - MIC*    PENICILLIN <=0.06 SENSITIVE Sensitive     CEFTRIAXONE <=0.12 SENSITIVE Sensitive     LEVOFLOXACIN 1 SENSITIVE Sensitive     VANCOMYCIN  0.5 SENSITIVE Sensitive     * STREPTOCOCCUS MITIS/ORALIS  Blood Culture ID Panel (Reflexed)     Status: Abnormal   Collection Time: 10/23/23  9:07 PM  Result Value Ref Range Status   Enterococcus faecalis NOT DETECTED NOT DETECTED Final   Enterococcus Faecium NOT DETECTED NOT DETECTED Final   Listeria monocytogenes NOT DETECTED NOT DETECTED Final   Staphylococcus species DETECTED (A) NOT DETECTED Final    Comment: CRITICAL RESULT CALLED TO, READ BACK BY AND VERIFIED  WITH: PHARMD JEREMY FRENS ON 10/24/23 @ 1502 BY DRT    Staphylococcus aureus (BCID) NOT DETECTED NOT DETECTED Final   Staphylococcus epidermidis DETECTED (A) NOT DETECTED Final    Comment: Methicillin (oxacillin) resistant coagulase negative staphylococcus. Possible blood culture contaminant (unless isolated from more than one  blood culture draw or clinical Mazzola suggests pathogenicity). No antibiotic treatment is indicated for blood  culture contaminants. CRITICAL RESULT CALLED TO, READ BACK BY AND VERIFIED WITH: PHARMD JEREMY FRENS ON 10/24/23 @ 1502 BY DRT    Staphylococcus lugdunensis NOT DETECTED NOT DETECTED Final   Streptococcus species DETECTED (A) NOT DETECTED Final    Comment: Not Enterococcus species, Streptococcus agalactiae, Streptococcus pyogenes, or Streptococcus pneumoniae. CRITICAL RESULT CALLED TO, READ BACK BY AND VERIFIED WITH: PHARMD JEREMY FRENS ON 10/24/23 @ 1502 BY DRT    Streptococcus agalactiae NOT DETECTED NOT DETECTED Final   Streptococcus pneumoniae NOT DETECTED NOT DETECTED Final   Streptococcus pyogenes NOT DETECTED NOT DETECTED Final   A.calcoaceticus-baumannii NOT DETECTED NOT DETECTED Final   Bacteroides fragilis NOT DETECTED NOT DETECTED Final   Enterobacterales NOT DETECTED NOT DETECTED Final   Enterobacter cloacae complex NOT DETECTED NOT DETECTED Final   Escherichia coli NOT DETECTED NOT DETECTED Final   Klebsiella aerogenes NOT DETECTED NOT DETECTED Final   Klebsiella oxytoca NOT DETECTED NOT DETECTED Final   Klebsiella pneumoniae NOT DETECTED NOT DETECTED Final   Proteus species NOT DETECTED NOT DETECTED Final   Salmonella species NOT DETECTED NOT DETECTED Final   Serratia marcescens NOT DETECTED NOT DETECTED Final   Haemophilus influenzae NOT DETECTED NOT DETECTED Final   Neisseria meningitidis NOT DETECTED NOT DETECTED Final   Pseudomonas aeruginosa NOT DETECTED NOT DETECTED Final   Stenotrophomonas maltophilia NOT DETECTED NOT DETECTED Final   Candida albicans NOT DETECTED NOT DETECTED Final   Candida auris NOT DETECTED NOT DETECTED Final   Candida glabrata NOT DETECTED NOT DETECTED Final   Candida krusei NOT DETECTED NOT DETECTED Final   Candida parapsilosis NOT DETECTED NOT DETECTED Final   Candida tropicalis NOT DETECTED NOT DETECTED Final   Cryptococcus  neoformans/gattii NOT DETECTED NOT DETECTED Final   Methicillin resistance mecA/C DETECTED (A) NOT DETECTED Final    Comment: CRITICAL RESULT CALLED TO, READ BACK BY AND VERIFIED WITH: PHARMD JEREMY FRENS ON 10/24/23 @ 1502 BY DRT Performed at [facility], [address] [place], [place] [address]   Urine Culture     Status: None   Collection Time: 10/23/23 11:49 PM   Specimen: Urine, Random  Result Value Ref Range Status   Specimen Description URINE, RANDOM  Final   Special Requests NONE Reflexed from [id number]  Final   Culture   Final    NO GROWTH Performed at [facility], [address], [place], [place] [address]    Report Status 10/24/2023 FINAL  Final  Respiratory (~20 pathogens) panel by PCR     Status: None   Collection Time: 10/24/23  2:02 AM   Specimen: Nasopharyngeal Swab; Respiratory  Result Value Ref Range Status   Adenovirus NOT DETECTED NOT DETECTED Final   Coronavirus 229E NOT DETECTED NOT DETECTED Final    Comment: (NOTE) The Coronavirus on the Respiratory Panel, DOES NOT test for the novel  Coronavirus (2019 nCoV)    Coronavirus HKU1 NOT DETECTED NOT DETECTED Final   Coronavirus NL63 NOT DETECTED NOT DETECTED Final   Coronavirus OC43 NOT DETECTED NOT DETECTED Final   Metapneumovirus NOT DETECTED NOT DETECTED Final  Rhinovirus / Enterovirus NOT DETECTED NOT DETECTED Final   Influenza A NOT DETECTED NOT DETECTED Final   Influenza B NOT DETECTED NOT DETECTED Final   Parainfluenza Virus 1 NOT DETECTED NOT DETECTED Final   Parainfluenza Virus 2 NOT DETECTED NOT DETECTED Final   Parainfluenza Virus 3 NOT DETECTED NOT DETECTED Final   Parainfluenza Virus 4 NOT DETECTED NOT DETECTED Final   Respiratory Syncytial Virus NOT DETECTED NOT DETECTED Final   Bordetella pertussis NOT DETECTED NOT DETECTED Final   Bordetella Parapertussis NOT DETECTED NOT DETECTED Final   Chlamydophila pneumoniae NOT DETECTED NOT DETECTED Final   Mycoplasma pneumoniae NOT  DETECTED NOT DETECTED Final    Comment: Performed at [facility], [address], [place], [place] [address]  MRSA Next Gen by PCR, Nasal     Status: None   Collection Time: 10/24/23  2:02 AM   Specimen: Nasopharyngeal Swab; Nasal Swab  Result Value Ref Range Status   MRSA by PCR Next Gen NOT DETECTED NOT DETECTED Final    Comment: (NOTE) The GeneXpert MRSA Assay (FDA approved for NASAL specimens only), is one component of a comprehensive MRSA colonization surveillance program. It is not intended to diagnose MRSA infection nor to guide or monitor treatment for MRSA infections. Test performance is not FDA approved in patients less than 4 years old. Performed at [facility], [address], [place], [place] [address]   Culture, blood (Routine X 2) w Reflex to ID Panel     Status: None   Collection Time: 10/26/23  6:07 AM   Specimen: BLOOD  Result Value Ref Range Status   Specimen Description BLOOD LEFT ANTECUBITAL  Final   Special Requests   Final    BOTTLES DRAWN AEROBIC ONLY Blood Culture results may not be optimal due to an inadequate volume of blood received in culture bottles   Culture   Final    NO GROWTH 5 DAYS Performed at [facility], [address], [place], [place] [address]    Report Status 10/31/2023 FINAL  Final  Culture, blood (Routine X 2) w Reflex to ID Panel     Status: None   Collection Time: 10/26/23  6:13 AM   Specimen: BLOOD LEFT HAND  Result Value Ref Range Status   Specimen Description BLOOD LEFT HAND  Final   Special Requests   Final    BOTTLES DRAWN AEROBIC AND ANAEROBIC Blood Culture adequate volume   Culture   Final    NO GROWTH 5 DAYS Performed at [facility], [address] [place], [place] [address]    Report Status 10/31/2023 FINAL  Final     Labs: BNP (last 3 results) Recent Labs    10/27/23 0737 10/28/23 0716 10/29/23 0721  BNP 796.6* 697.6* 416.7*   Basic Metabolic Panel: Recent Labs  Lab  10/25/23 0536 10/26/23 2956 10/27/23 0737 10/28/23 0716 10/29/23 0721 10/30/23 0757 10/31/23 0551  NA  --  142 140 138 140 138 139  K  --  3.7 4.5 4.3 4.1 4.0 3.5  CL  --  113* 111 106 101 97* 98  CO2  --  23 20* 23 27 30 30   GLUCOSE  --  113* 112* 118* 123* 126* 120*  BUN  --  25* 17 17 18 21 18   CREATININE  --  1.10* 1.09* 1.08* 1.14* 1.23* 1.19*  CALCIUM  --  8.6* 8.4* 8.7* 9.0 9.1 8.8*  MG 1.7 1.5* 1.7 1.5* 1.8  --   --  PHOS 2.4* 3.4 2.9 3.5 3.9  --   --    Liver Function Tests: Recent Labs  Lab 10/26/23 0613 10/27/23 0737 10/28/23 0716 10/29/23 0721 10/30/23 0757  AST 77* 45* 34 29 27  ALT 48* 39 34 29 25  ALKPHOS 52 45 56 46 48  BILITOT 0.7 0.6 0.7 0.6 0.6  PROT 5.5* 5.3* 5.2* 5.5* 5.7*  ALBUMIN  2.3* 2.1* 2.2* 2.3* 2.4*   No results for input(s): LIPASE, AMYLASE in the last 168 hours. Recent Labs  Lab 10/26/23 0613 10/27/23 0737 10/28/23 0716 10/29/23 0721 10/30/23 0757  AMMONIA 15 30 30 31 29    CBC: Recent Labs  Lab 10/26/23 0613 10/27/23 0737 10/28/23 0716 10/29/23 0721 10/30/23 0757 10/31/23 0551  WBC 11.6* 14.0* 15.5* 14.5* 13.2* 10.2  NEUTROABS 7.9* 10.5* 11.3* 10.4* 9.9*  --   HGB 9.9* 10.1* 10.3* 10.4* 10.4* 10.5*  HCT 32.4* 32.0* 33.1* 33.5* 33.0* 34.1*  MCV 89.3 88.2 88.5 88.4 88.2 90.2  PLT 286 334 347 381 385 403*   Cardiac Enzymes: Recent Labs  Lab 10/25/23 0524 10/26/23 0613  CKTOTAL 1,409* 968*   BNP: Invalid input(s): POCBNP CBG: Recent Labs  Lab 10/30/23 1126 10/30/23 1542 10/30/23 2100 10/31/23 0910 10/31/23 1139  GLUCAP 118* 150* 126* 218* 121*   D-Dimer No results for input(s): DDIMER in the last 72 hours. Hgb A1c No results for input(s): HGBA1C in the last 72 hours. Lipid Profile No results for input(s): CHOL, HDL, LDLCALC, TRIG, CHOLHDL, LDLDIRECT in the last 72 hours. Thyroid function studies No results for input(s): TSH, T4TOTAL, T3FREE, THYROIDAB in the last 72  hours.  Invalid input(s): FREET3 Anemia work up No results for input(s): VITAMINB12, FOLATE, FERRITIN, TIBC, IRON, RETICCTPCT in the last 72 hours. Urinalysis    Component Value Date/Time   COLORURINE AMBER (A) 10/23/2023 2349   APPEARANCEUR CLOUDY (A) 10/23/2023 2349   LABSPEC 1.025 10/23/2023 2349   PHURINE 5.0 10/23/2023 2349   GLUCOSEU NEGATIVE 10/23/2023 2349   HGBUR SMALL (A) 10/23/2023 2349   BILIRUBINUR NEGATIVE 10/23/2023 2349   KETONESUR 5 (A) 10/23/2023 2349   PROTEINUR 30 (A) 10/23/2023 2349   UROBILINOGEN 0.2 01/08/2014 1910   NITRITE NEGATIVE 10/23/2023 2349   LEUKOCYTESUR MODERATE (A) 10/23/2023 2349   Sepsis Labs Recent Labs  Lab 10/28/23 0716 10/29/23 0721 10/30/23 0757 10/31/23 0551  WBC 15.5* 14.5* 13.2* 10.2   Microbiology Recent Results (from the past 240 hours)  Blood Culture (routine x 2)     Status: Abnormal   Collection Time: 10/23/23  9:06 PM   Specimen: BLOOD LEFT WRIST  Result Value Ref Range Status   Specimen Description BLOOD LEFT WRIST  Final   Special Requests   Final    BOTTLES DRAWN AEROBIC AND ANAEROBIC Blood Culture adequate volume   Culture  Setup Time   Final    GRAM POSITIVE COCCI IN BOTH AEROBIC AND ANAEROBIC BOTTLES CRITICAL VALUE NOTED.  VALUE IS CONSISTENT WITH PREVIOUSLY REPORTED AND CALLED VALUE. Performed at [facility], [address], [place], [place] [address]    Culture STAPHYLOCOCCUS EPIDERMIDIS (A)  Final   Report Status 10/27/2023 FINAL  Final   Organism ID, Bacteria STAPHYLOCOCCUS EPIDERMIDIS  Final      Susceptibility   Staphylococcus epidermidis - MIC*    CIPROFLOXACIN <=0.5 SENSITIVE Sensitive     ERYTHROMYCIN <=0.25 SENSITIVE Sensitive     GENTAMICIN <=0.5 SENSITIVE Sensitive     OXACILLIN >=4 RESISTANT Resistant     TETRACYCLINE >=16  RESISTANT Resistant     VANCOMYCIN  1 SENSITIVE Sensitive     TRIMETH/SULFA <=10 SENSITIVE Sensitive     CLINDAMYCIN <=0.25 SENSITIVE Sensitive      RIFAMPIN <=0.5 SENSITIVE Sensitive     Inducible Clindamycin NEGATIVE Sensitive     * STAPHYLOCOCCUS EPIDERMIDIS  Blood Culture (routine x 2)     Status: Abnormal   Collection Time: 10/23/23  9:07 PM   Specimen: BLOOD RIGHT WRIST  Result Value Ref Range Status   Specimen Description BLOOD RIGHT WRIST  Final   Special Requests   Final    BOTTLES DRAWN AEROBIC AND ANAEROBIC Blood Culture adequate volume   Culture  Setup Time   Final    GRAM POSITIVE COCCI IN CLUSTERS GRAM POSITIVE COCCI IN CHAINS IN BOTH AEROBIC AND ANAEROBIC BOTTLES CRITICAL RESULT CALLED TO, READ BACK BY AND VERIFIED WITH: PHARMD JEREMY FRENS ON 10/24/23 @ 1502 BY DRT Performed at [facility], [address] [place], [place] [address]    Culture (A)  Final    STAPHYLOCOCCUS EPIDERMIDIS STREPTOCOCCUS MITIS/ORALIS    Report Status 10/26/2023 FINAL  Final   Organism ID, Bacteria STAPHYLOCOCCUS EPIDERMIDIS  Final   Organism ID, Bacteria STREPTOCOCCUS MITIS/ORALIS  Final      Susceptibility   Staphylococcus epidermidis - MIC*    CIPROFLOXACIN 2 INTERMEDIATE Intermediate     ERYTHROMYCIN <=0.25 SENSITIVE Sensitive     GENTAMICIN <=0.5 SENSITIVE Sensitive     OXACILLIN >=4 RESISTANT Resistant     TETRACYCLINE >=16 RESISTANT Resistant     VANCOMYCIN  <=0.5 SENSITIVE Sensitive     TRIMETH/SULFA <=10 SENSITIVE Sensitive     CLINDAMYCIN <=0.25 SENSITIVE Sensitive     RIFAMPIN <=0.5 SENSITIVE Sensitive     Inducible Clindamycin NEGATIVE Sensitive     * STAPHYLOCOCCUS EPIDERMIDIS   Streptococcus mitis/oralis - MIC*    PENICILLIN <=0.06 SENSITIVE Sensitive     CEFTRIAXONE <=0.12 SENSITIVE Sensitive     LEVOFLOXACIN 1 SENSITIVE Sensitive     VANCOMYCIN  0.5 SENSITIVE Sensitive     * STREPTOCOCCUS MITIS/ORALIS  Blood Culture ID Panel (Reflexed)     Status: Abnormal   Collection Time: 10/23/23  9:07 PM  Result Value Ref Range Status   Enterococcus faecalis NOT DETECTED NOT DETECTED Final   Enterococcus Faecium NOT  DETECTED NOT DETECTED Final   Listeria monocytogenes NOT DETECTED NOT DETECTED Final   Staphylococcus species DETECTED (A) NOT DETECTED Final    Comment: CRITICAL RESULT CALLED TO, READ BACK BY AND VERIFIED WITH: PHARMD JEREMY FRENS ON 10/24/23 @ 1502 BY DRT    Staphylococcus aureus (BCID) NOT DETECTED NOT DETECTED Final   Staphylococcus epidermidis DETECTED (A) NOT DETECTED Final    Comment: Methicillin (oxacillin) resistant coagulase negative staphylococcus. Possible blood culture contaminant (unless isolated from more than one blood culture draw or clinical Ransom suggests pathogenicity). No antibiotic treatment is indicated for blood  culture contaminants. CRITICAL RESULT CALLED TO, READ BACK BY AND VERIFIED WITH: PHARMD JEREMY FRENS ON 10/24/23 @ 1502 BY DRT    Staphylococcus lugdunensis NOT DETECTED NOT DETECTED Final   Streptococcus species DETECTED (A) NOT DETECTED Final    Comment: Not Enterococcus species, Streptococcus agalactiae, Streptococcus pyogenes, or Streptococcus pneumoniae. CRITICAL RESULT CALLED TO, READ BACK BY AND VERIFIED WITH: PHARMD JEREMY FRENS ON 10/24/23 @ 1502 BY DRT    Streptococcus agalactiae NOT DETECTED NOT DETECTED Final   Streptococcus pneumoniae NOT DETECTED NOT DETECTED Final   Streptococcus pyogenes NOT DETECTED NOT DETECTED Final   A.calcoaceticus-baumannii NOT  DETECTED NOT DETECTED Final   Bacteroides fragilis NOT DETECTED NOT DETECTED Final   Enterobacterales NOT DETECTED NOT DETECTED Final   Enterobacter cloacae complex NOT DETECTED NOT DETECTED Final   Escherichia coli NOT DETECTED NOT DETECTED Final   Klebsiella aerogenes NOT DETECTED NOT DETECTED Final   Klebsiella oxytoca NOT DETECTED NOT DETECTED Final   Klebsiella pneumoniae NOT DETECTED NOT DETECTED Final   Proteus species NOT DETECTED NOT DETECTED Final   Salmonella species NOT DETECTED NOT DETECTED Final   Serratia marcescens NOT DETECTED NOT DETECTED Final   Haemophilus influenzae NOT  DETECTED NOT DETECTED Final   Neisseria meningitidis NOT DETECTED NOT DETECTED Final   Pseudomonas aeruginosa NOT DETECTED NOT DETECTED Final   Stenotrophomonas maltophilia NOT DETECTED NOT DETECTED Final   Candida albicans NOT DETECTED NOT DETECTED Final   Candida auris NOT DETECTED NOT DETECTED Final   Candida glabrata NOT DETECTED NOT DETECTED Final   Candida krusei NOT DETECTED NOT DETECTED Final   Candida parapsilosis NOT DETECTED NOT DETECTED Final   Candida tropicalis NOT DETECTED NOT DETECTED Final   Cryptococcus neoformans/gattii NOT DETECTED NOT DETECTED Final   Methicillin resistance mecA/C DETECTED (A) NOT DETECTED Final    Comment: CRITICAL RESULT CALLED TO, READ BACK BY AND VERIFIED WITH: PHARMD JEREMY FRENS ON 10/24/23 @ 1502 BY DRT Performed at [facility], [address] [place], [place] [address]   Urine Culture     Status: None   Collection Time: 10/23/23 11:49 PM   Specimen: Urine, Random  Result Value Ref Range Status   Specimen Description URINE, RANDOM  Final   Special Requests NONE Reflexed from [id number]  Final   Culture   Final    NO GROWTH Performed at [facility], [address], [place], [place] [address]    Report Status 10/24/2023 FINAL  Final  Respiratory (~20 pathogens) panel by PCR     Status: None   Collection Time: 10/24/23  2:02 AM   Specimen: Nasopharyngeal Swab; Respiratory  Result Value Ref Range Status   Adenovirus NOT DETECTED NOT DETECTED Final   Coronavirus 229E NOT DETECTED NOT DETECTED Final    Comment: (NOTE) The Coronavirus on the Respiratory Panel, DOES NOT test for the novel  Coronavirus (2019 nCoV)    Coronavirus HKU1 NOT DETECTED NOT DETECTED Final   Coronavirus NL63 NOT DETECTED NOT DETECTED Final   Coronavirus OC43 NOT DETECTED NOT DETECTED Final   Metapneumovirus NOT DETECTED NOT DETECTED Final   Rhinovirus / Enterovirus NOT DETECTED NOT DETECTED Final   Influenza A NOT DETECTED NOT DETECTED Final   Influenza  B NOT DETECTED NOT DETECTED Final   Parainfluenza Virus 1 NOT DETECTED NOT DETECTED Final   Parainfluenza Virus 2 NOT DETECTED NOT DETECTED Final   Parainfluenza Virus 3 NOT DETECTED NOT DETECTED Final   Parainfluenza Virus 4 NOT DETECTED NOT DETECTED Final   Respiratory Syncytial Virus NOT DETECTED NOT DETECTED Final   Bordetella pertussis NOT DETECTED NOT DETECTED Final   Bordetella Parapertussis NOT DETECTED NOT DETECTED Final   Chlamydophila pneumoniae NOT DETECTED NOT DETECTED Final   Mycoplasma pneumoniae NOT DETECTED NOT DETECTED Final    Comment: Performed at [facility], [address] [place], [place] [address]  MRSA Next Gen by PCR, Nasal     Status: None   Collection Time: 10/24/23  2:02 AM   Specimen: Nasopharyngeal Swab; Nasal Swab  Result Value Ref Range Status   MRSA by PCR Next Gen NOT  DETECTED NOT DETECTED Final    Comment: (NOTE) The GeneXpert MRSA Assay (FDA approved for NASAL specimens only), is one component of a comprehensive MRSA colonization surveillance program. It is not intended to diagnose MRSA infection nor to guide or monitor treatment for MRSA infections. Test performance is not FDA approved in patients less than 25 years old. Performed at [facility], [address], [place], [place] [address]   Culture, blood (Routine X 2) w Reflex to ID Panel     Status: None   Collection Time: 10/26/23  6:07 AM   Specimen: BLOOD  Result Value Ref Range Status   Specimen Description BLOOD LEFT ANTECUBITAL  Final   Special Requests   Final    BOTTLES DRAWN AEROBIC ONLY Blood Culture results may not be optimal due to an inadequate volume of blood received in culture bottles   Culture   Final    NO GROWTH 5 DAYS Performed at [facility], [address], [place], [place] [address]    Report Status 10/31/2023 FINAL  Final  Culture, blood (Routine X 2) w Reflex to ID Panel     Status: None   Collection Time: 10/26/23  6:13 AM   Specimen: BLOOD  LEFT HAND  Result Value Ref Range Status   Specimen Description BLOOD LEFT HAND  Final   Special Requests   Final    BOTTLES DRAWN AEROBIC AND ANAEROBIC Blood Culture adequate volume   Culture   Final    NO GROWTH 5 DAYS Performed at [facility], [address] [place], [place] [address]    Report Status 10/31/2023 FINAL  Final     Time coordinating discharge: Over 30 minutes  SIGNED:   Seena Dadds, MD  [facility] 10/31/2023, 12:05 PM Pager   If 7PM-7AM, please contact night-coverage [email or web address] Password [email or web address]

## 2023-10-31 NOTE — Plan of Care (Signed)
 progressing

## 2023-10-31 NOTE — TOC Transition Note (Signed)
 Transition of Care [facility]) - Discharge Note   Patient Details  Name: [name] [name] MRN: [id number] Date of Birth: June 12, 1950  Transition of Care [facility]) CM/SW Contact:  [name], LCSW Phone Number: 10/31/2023, 1:59 PM   Clinical Narrative:    Patient will DC to: [facility] Anticipated DC date: 10/31/23 Family notified: [name] Transport by: Lyna Sandhoff   Per MD patient ready for DC to [facility]. RN to call report prior to discharge [phone] room 127A). RN, patient, patient's family, and facility notified of DC. Discharge Summary and FL2 sent to facility. DC packet on chart including signed script and DNR. Ambulance transport requested for patient.   CSW will sign off for now as social work intervention is no longer needed. Please consult us  again if new needs arise.     Final next level of care: Skilled Nursing Facility Barriers to Discharge: Barriers Resolved   Patient Goals and CMS Choice Patient states their goals for this hospitalization and ongoing recovery are:: Rehab CMS Medicare.gov Compare Post Acute Care list provided to:: Patient Represenative (must comment) Choice offered to / list presented to : [facility] POA / Guardian [employer] ownership interest in [facility]provided to:: [facility] POA / Guardian    Discharge Placement   Existing PASRR number confirmed : 10/31/23          Patient chooses bed at:  [facility]) Patient to be transferred to facility by: [facility] Name of family member notified: Arlee Lace Patient and family notified of of transfer: 10/31/23  Discharge Plan and Services Additional resources added to the After Visit Summary for   In-house Referral: Clinical Social Work   Post Acute Care Choice: Skilled Nursing Facility                               Social Drivers of Health (SDOH) Interventions SDOH Screenings   Food Insecurity: Food Insecurity Present (10/25/2023)  Housing: High Risk (10/25/2023)  Transportation Needs:  Unmet Transportation Needs (10/25/2023)  Utilities: At Risk (10/25/2023)  Social Connections: Socially Isolated (10/25/2023)  Tobacco Use: Medium Risk (10/24/2023)     Readmission Risk Interventions    06/01/2022   12:03 PM  Readmission Risk Prevention Plan  Post Dischage Appt Complete  Medication Screening Complete  Transportation Screening Complete
